# Patient Record
Sex: Female | Born: 1955 | Race: White | Hispanic: No | Marital: Married | Smoking: Former smoker
Health system: Southern US, Community
[De-identification: ages and names within clinical notes are randomized; demographics above are authoritative.]

## PROBLEM LIST (undated history)

## (undated) DIAGNOSIS — I2510 Atherosclerotic heart disease of native coronary artery without angina pectoris: Secondary | ICD-10-CM

## (undated) DIAGNOSIS — K219 Gastro-esophageal reflux disease without esophagitis: Secondary | ICD-10-CM

## (undated) DIAGNOSIS — E119 Type 2 diabetes mellitus without complications: Secondary | ICD-10-CM

## (undated) DIAGNOSIS — M4810 Ankylosing hyperostosis [Forestier], site unspecified: Secondary | ICD-10-CM

## (undated) DIAGNOSIS — I509 Heart failure, unspecified: Secondary | ICD-10-CM

## (undated) DIAGNOSIS — I10 Essential (primary) hypertension: Secondary | ICD-10-CM

## (undated) DIAGNOSIS — N189 Chronic kidney disease, unspecified: Secondary | ICD-10-CM

## (undated) DIAGNOSIS — D649 Anemia, unspecified: Secondary | ICD-10-CM

## (undated) DIAGNOSIS — E875 Hyperkalemia: Secondary | ICD-10-CM

## (undated) DIAGNOSIS — L409 Psoriasis, unspecified: Secondary | ICD-10-CM

## (undated) HISTORY — PX: CORONARY STENT PLACEMENT: SHX1402

## (undated) HISTORY — PX: CORONARY ARTERY BYPASS GRAFT: SHX141

## (undated) HISTORY — DX: Ankylosing hyperostosis (forestier), site unspecified: M48.10

## (undated) HISTORY — DX: Gastro-esophageal reflux disease without esophagitis: K21.9

## (undated) HISTORY — PX: TUBAL LIGATION: SHX77

## (undated) HISTORY — DX: Hyperkalemia: E87.5

## (undated) HISTORY — DX: Atherosclerotic heart disease of native coronary artery without angina pectoris: I25.10

## (undated) HISTORY — DX: Anemia, unspecified: D64.9

## (undated) HISTORY — DX: Chronic kidney disease, unspecified: N18.9

## (undated) HISTORY — DX: Heart failure, unspecified: I50.9

## (undated) HISTORY — PX: HERNIA REPAIR: SHX51

---

## 2010-05-23 DIAGNOSIS — I2510 Atherosclerotic heart disease of native coronary artery without angina pectoris: Secondary | ICD-10-CM | POA: Insufficient documentation

## 2011-08-16 ENCOUNTER — Encounter: Payer: Self-pay | Admitting: Rheumatology

## 2011-08-22 ENCOUNTER — Encounter: Payer: Self-pay | Admitting: Rheumatology

## 2011-09-22 ENCOUNTER — Encounter: Payer: Self-pay | Admitting: Rheumatology

## 2015-05-02 ENCOUNTER — Emergency Department
Admission: EM | Admit: 2015-05-02 | Discharge: 2015-05-02 | Disposition: A | Discharge status: Home / Self Care | Payer: BLUE CROSS/BLUE SHIELD | Attending: Emergency Medicine | Admitting: Emergency Medicine

## 2015-05-02 ENCOUNTER — Encounter: Payer: Self-pay | Admitting: Emergency Medicine

## 2015-05-02 DIAGNOSIS — E119 Type 2 diabetes mellitus without complications: Secondary | ICD-10-CM | POA: Diagnosis not present

## 2015-05-02 DIAGNOSIS — Y9389 Activity, other specified: Secondary | ICD-10-CM | POA: Diagnosis not present

## 2015-05-02 DIAGNOSIS — Z87891 Personal history of nicotine dependence: Secondary | ICD-10-CM | POA: Insufficient documentation

## 2015-05-02 DIAGNOSIS — S6991XA Unspecified injury of right wrist, hand and finger(s), initial encounter: Secondary | ICD-10-CM | POA: Diagnosis present

## 2015-05-02 DIAGNOSIS — Y9289 Other specified places as the place of occurrence of the external cause: Secondary | ICD-10-CM | POA: Diagnosis not present

## 2015-05-02 DIAGNOSIS — Y990 Civilian activity done for income or pay: Secondary | ICD-10-CM | POA: Insufficient documentation

## 2015-05-02 DIAGNOSIS — L03011 Cellulitis of right finger: Secondary | ICD-10-CM | POA: Insufficient documentation

## 2015-05-02 DIAGNOSIS — I10 Essential (primary) hypertension: Secondary | ICD-10-CM | POA: Insufficient documentation

## 2015-05-02 DIAGNOSIS — W228XXA Striking against or struck by other objects, initial encounter: Secondary | ICD-10-CM | POA: Diagnosis not present

## 2015-05-02 HISTORY — DX: Essential (primary) hypertension: I10

## 2015-05-02 HISTORY — DX: Type 2 diabetes mellitus without complications: E11.9

## 2015-05-02 HISTORY — DX: Psoriasis, unspecified: L40.9

## 2015-05-02 MED ORDER — TRAMADOL HCL 50 MG PO TABS: 50.0000 mg | ORAL_TABLET | Freq: Four times a day (QID) | ORAL | Status: DC | PRN

## 2015-05-02 MED ORDER — SULFAMETHOXAZOLE-TRIMETHOPRIM 800-160 MG PO TABS
1.0000 | ORAL_TABLET | Freq: Once | ORAL | Status: AC
  Administered 2015-05-02: 1 via ORAL
  Filled 2015-05-02: qty 1

## 2015-05-02 MED ORDER — NAPROXEN 500 MG PO TABS: 500.0000 mg | ORAL_TABLET | Freq: Two times a day (BID) | ORAL | Status: DC

## 2015-05-02 MED ORDER — NAPROXEN 500 MG PO TABS
500.0000 mg | ORAL_TABLET | Freq: Once | ORAL | Status: AC
  Administered 2015-05-02: 500 mg via ORAL
  Filled 2015-05-02: qty 1

## 2015-05-02 MED ORDER — SULFAMETHOXAZOLE-TRIMETHOPRIM 800-160 MG PO TABS: 1.0000 | ORAL_TABLET | Freq: Two times a day (BID) | ORAL | Status: DC

## 2015-05-02 NOTE — ED Notes (Signed)
Staff cleaned Rt. Middle finger with sterile water and sterile gauze. Finger was dried with a sterile gauze and a band aid was applied to wound. Pt verbalized finger felt comfortable and that she was happy with dressing change.

## 2015-05-02 NOTE — ED Provider Notes (Signed)
[facility] Emergency Department Provider Note  ____________________________________________  Time seen: Approximately 6:39 PM  I have reviewed the triage vital signs and the nursing notes.   HISTORY  Chief Complaint Hand Pain    HPI Katrina Ortiz is a 60 y.o. female patient complaining of redness swelling and tenderness to the third digit right hand. Patient believes she punctured her cuticle at work days ago.. Patient states she was able to express some purulent material from the area 2 days ago. Patient rates the pain as a 5/10. Patient has been soaking the finger in Epsom salt solution for 2 days. No other palliative measures for this complaint. Past Medical History  Diagnosis Date  . Hypertension   . Diabetes mellitus without complication ([place])   . Psoriasis     There are no active problems to display for this patient.   Past Surgical History  Procedure Laterality Date  . Tubal ligation    . Hernia repair    . Cesarean section      2 c-sections  . Coronary artery bypass graft    . Coronary stent placement      Current Outpatient Rx  Name  Route  Sig  Dispense  Refill  . naproxen (NAPROSYN) 500 MG tablet   Oral   Take 1 tablet (500 mg total) by mouth 2 (two) times daily with a meal.   20 tablet   0   . sulfamethoxazole-trimethoprim (BACTRIM DS,SEPTRA DS) 800-160 MG tablet   Oral   Take 1 tablet by mouth 2 (two) times daily.   20 tablet   0   . traMADol (ULTRAM) 50 MG tablet   Oral   Take 1 tablet (50 mg total) by mouth every 6 (six) hours as needed for moderate pain.   12 tablet   0     Allergies Review of patient's allergies indicates no known allergies.  History reviewed. No pertinent family history.  Social History Social History  Substance Use Topics  . Smoking status: Former Smoker    Quit date: 11/22/2002  . Smokeless tobacco: None  . Alcohol Use: No    Review of Systems Constitutional: No fever/chills Eyes:  No visual changes. ENT: No sore throat. Cardiovascular: Denies chest pain. Respiratory: Denies shortness of breath. Gastrointestinal: No abdominal pain.  No nausea, no vomiting.  No diarrhea.  No constipation. Genitourinary: Negative for dysuria. Musculoskeletal: Negative for back pain. Skin: Negative for rash. There is a swelling to the third digit right hand. Neurological: Negative for headaches, focal weakness or numbness. Endocrine:Hypertension and diabetes.  ____________________________________________   PHYSICAL EXAM:  VITAL SIGNS: ED Triage Vitals  Enc Vitals Group     BP 05/02/15 1714 197/65 mmHg     Pulse Rate 05/02/15 1714 90     Resp 05/02/15 1714 20     Temp 05/02/15 1714 98.2 F (36.8 C)     Temp src --      SpO2 05/02/15 1714 97 %     Weight 05/02/15 1714 240 lb (108.863 kg)     Height 05/02/15 1714 5\' 4"  (1.626 m)     Head Cir --      Peak Flow --      Pain Score 05/02/15 1724 5     Pain Loc --      Pain Edu? --      Excl. in [place]? --     Constitutional: Alert and oriented. Well appearing and in no acute distress. Eyes: Conjunctivae are normal.  PERRL. EOMI. Head: Atraumatic. Nose: No congestion/rhinnorhea. Mouth/Throat: Mucous membranes are moist.  Oropharynx non-erythematous. Neck: No stridor.  No cervical spine tenderness to palpation. Hematological/Lymphatic/Immunilogical: No cervical lymphadenopathy. Cardiovascular: Normal rate, regular rhythm. Grossly normal heart sounds.  Good peripheral circulation. Elevated blood pressure. Patient states she did not take her blood pressure medicine today. Respiratory: Normal respiratory effort.  No retractions. Lungs CTAB. Gastrointestinal: Soft and nontender. No distention. No abdominal bruits. No CVA tenderness. Musculoskeletal: No lower extremity tenderness nor edema.  No joint effusions. Neurologic:  Normal speech and language. No gross focal neurologic deficits are appreciated. No gait instability. Skin:   Skin is warm, dry and intact. No rash noted. Edema and erythema to the medial nailbed of the third digit right hand. Psychiatric: Mood and affect are normal. Speech and behavior are normal.  ____________________________________________   LABS (all labs ordered are listed, but only abnormal results are displayed)  Labs Reviewed - No data to display ____________________________________________  EKG   ____________________________________________  RADIOLOGY   ____________________________________________   PROCEDURES  Procedure(s) performed: None  Critical Care performed: No  ____________________________________________   INITIAL IMPRESSION / ASSESSMENT AND PLAN / ED COURSE  Pertinent labs & imaging results that were available during my care of the patient were reviewed by me and considered in my medical decision making (see chart for details).  Paronychia third digit right hand. Patient given discharge care instructions. Patient given a prescription for Bactrim, naproxen, and tramadol. Advised return by ER for condition worsens. ____________________________________________   FINAL CLINICAL IMPRESSION(S) / ED DIAGNOSES  Final diagnoses:  Paronychia of finger of right hand      Sable Feil, PA-C 05/02/15 [place], PA-C 05/02/15 1904  Earleen Newport, MD 05/02/15 [phone]

## 2015-05-02 NOTE — ED Notes (Signed)
Pt presents with redness, swelling, tenderness and pain to R middle finger at this time. Pt states pain started approx 4 days ago when she injured her finger at work. Pt states with some pressure pus was able to come out. Redness and swelling noted to the first distal knuckle but does not extend past that.

## 2015-05-02 NOTE — Discharge Instructions (Signed)
Continued Epsom salt soaks and take medication as directed.

## 2019-02-27 ENCOUNTER — Other Ambulatory Visit: Payer: Self-pay

## 2019-02-27 ENCOUNTER — Emergency Department: Payer: BLUE CROSS/BLUE SHIELD

## 2019-02-27 ENCOUNTER — Emergency Department
Admission: EM | Admit: 2019-02-27 | Discharge: 2019-02-27 | Disposition: A | Discharge status: Home / Self Care | Payer: BLUE CROSS/BLUE SHIELD | Attending: Emergency Medicine | Admitting: Emergency Medicine

## 2019-02-27 DIAGNOSIS — I2510 Atherosclerotic heart disease of native coronary artery without angina pectoris: Secondary | ICD-10-CM | POA: Diagnosis not present

## 2019-02-27 DIAGNOSIS — I10 Essential (primary) hypertension: Secondary | ICD-10-CM | POA: Diagnosis not present

## 2019-02-27 DIAGNOSIS — R1084 Generalized abdominal pain: Secondary | ICD-10-CM | POA: Diagnosis not present

## 2019-02-27 DIAGNOSIS — Z951 Presence of aortocoronary bypass graft: Secondary | ICD-10-CM | POA: Insufficient documentation

## 2019-02-27 DIAGNOSIS — Z7901 Long term (current) use of anticoagulants: Secondary | ICD-10-CM | POA: Diagnosis not present

## 2019-02-27 DIAGNOSIS — E119 Type 2 diabetes mellitus without complications: Secondary | ICD-10-CM | POA: Diagnosis not present

## 2019-02-27 DIAGNOSIS — Z87891 Personal history of nicotine dependence: Secondary | ICD-10-CM | POA: Insufficient documentation

## 2019-02-27 DIAGNOSIS — Z79899 Other long term (current) drug therapy: Secondary | ICD-10-CM | POA: Insufficient documentation

## 2019-02-27 DIAGNOSIS — R197 Diarrhea, unspecified: Secondary | ICD-10-CM | POA: Diagnosis not present

## 2019-02-27 LAB — URINALYSIS, COMPLETE (UACMP) WITH MICROSCOPIC
Bilirubin Urine: NEGATIVE
Glucose, UA: 150 mg/dL — AB
Hgb urine dipstick: NEGATIVE
Ketones, ur: 5 mg/dL — AB
Nitrite: NEGATIVE
Protein, ur: 100 mg/dL — AB
Specific Gravity, Urine: 1.027 (ref 1.005–1.030)
WBC, UA: >50 WBC/hpf — ABNORMAL HIGH (ref 0–5)
pH: 5 (ref 5.0–8.0)

## 2019-02-27 LAB — COMPREHENSIVE METABOLIC PANEL
ALT: 10 U/L (ref 0–44)
AST: 10 U/L — ABNORMAL LOW (ref 15–41)
Albumin: 2.5 g/dL — ABNORMAL LOW (ref 3.5–5.0)
Alkaline Phosphatase: 95 U/L (ref 38–126)
Anion gap: 9 (ref 5–15)
BUN: 10 mg/dL (ref 8–23)
CO2: 25 mmol/L (ref 22–32)
Calcium: 8.2 mg/dL — ABNORMAL LOW (ref 8.9–10.3)
Chloride: 104 mmol/L (ref 98–111)
Creatinine, Ser: 0.85 mg/dL (ref 0.44–1.00)
GFR calc Af Amer: >60 mL/min (ref >60)
GFR calc non Af Amer: >60 mL/min (ref >60)
Glucose, Bld: 166 mg/dL — ABNORMAL HIGH (ref 70–99)
Potassium: 3.5 mmol/L (ref 3.5–5.1)
Sodium: 138 mmol/L (ref 135–145)
Total Bilirubin: 0.8 mg/dL (ref 0.3–1.2)
Total Protein: 6.2 g/dL — ABNORMAL LOW (ref 6.5–8.1)

## 2019-02-27 LAB — CBC
HCT: 37.4 % (ref 36.0–46.0)
Hemoglobin: 11.5 g/dL — ABNORMAL LOW (ref 12.0–15.0)
MCH: 22.8 pg — ABNORMAL LOW (ref 26.0–34.0)
MCHC: 30.7 g/dL (ref 30.0–36.0)
MCV: 74.1 fL — ABNORMAL LOW (ref 80.0–100.0)
Platelets: 308 10*3/uL (ref 150–400)
RBC: 5.05 MIL/uL (ref 3.87–5.11)
RDW: 16.7 % — ABNORMAL HIGH (ref 11.5–15.5)
WBC: 9 10*3/uL (ref 4.0–10.5)
nRBC: 0 % (ref 0.0–0.2)

## 2019-02-27 LAB — GLUCOSE, CAPILLARY: Glucose-Capillary: 136 mg/dL — ABNORMAL HIGH (ref 70–99)

## 2019-02-27 LAB — LACTIC ACID, PLASMA: Lactic Acid, Venous: 0.9 mmol/L (ref 0.5–1.9)

## 2019-02-27 LAB — TROPONIN I (HIGH SENSITIVITY): Troponin I (High Sensitivity): 12 ng/L (ref <18)

## 2019-02-27 LAB — LIPASE, BLOOD: Lipase: 18 U/L (ref 11–51)

## 2019-02-27 MED ORDER — LOPERAMIDE HCL 2 MG PO CAPS
4.0000 mg | ORAL_CAPSULE | Freq: Once | ORAL | Status: AC
  Administered 2019-02-27: 4 mg via ORAL
  Filled 2019-02-27: qty 2

## 2019-02-27 MED ORDER — ONDANSETRON 4 MG PO TBDP
4.0000 mg | ORAL_TABLET | Freq: Once | ORAL | Status: AC | PRN
  Administered 2019-02-27: 4 mg via ORAL
  Filled 2019-02-27: qty 1

## 2019-02-27 MED ORDER — ONDANSETRON HCL 4 MG/2ML IJ SOLN
4.0000 mg | Freq: Once | INTRAMUSCULAR | Status: AC
  Administered 2019-02-27: 4 mg via INTRAVENOUS
  Filled 2019-02-27: qty 2

## 2019-02-27 MED ORDER — SODIUM CHLORIDE 0.9 % IV BOLUS
1000.0000 mL | Freq: Once | INTRAVENOUS | Status: AC
  Administered 2019-02-27: 1000 mL via INTRAVENOUS

## 2019-02-27 MED ORDER — ONDANSETRON 4 MG PO TBDP: 4.0000 mg | ORAL_TABLET | Freq: Three times a day (TID) | ORAL | 0 refills | Status: DC | PRN

## 2019-02-27 MED ORDER — KETOROLAC TROMETHAMINE 30 MG/ML IJ SOLN
15.0000 mg | INTRAMUSCULAR | Status: AC
  Administered 2019-02-27: 09:00:00 15 mg via INTRAVENOUS
  Filled 2019-02-27: qty 1

## 2019-02-27 MED ORDER — OXYCODONE-ACETAMINOPHEN 5-325 MG PO TABS: 1.0000 | ORAL_TABLET | Freq: Four times a day (QID) | ORAL | 0 refills | Status: DC | PRN

## 2019-02-27 MED ORDER — OXYCODONE-ACETAMINOPHEN 5-325 MG PO TABS
1.0000 | ORAL_TABLET | Freq: Once | ORAL | Status: AC
  Administered 2019-02-27: 1 via ORAL
  Filled 2019-02-27: qty 1

## 2019-02-27 MED ORDER — IOHEXOL 300 MG/ML  SOLN
100.0000 mL | Freq: Once | INTRAMUSCULAR | Status: AC | PRN
  Administered 2019-02-27: 100 mL via INTRAVENOUS

## 2019-02-27 MED ORDER — MORPHINE SULFATE (PF) 4 MG/ML IV SOLN
4.0000 mg | Freq: Once | INTRAVENOUS | Status: AC
  Administered 2019-02-27: 4 mg via INTRAVENOUS
  Filled 2019-02-27: qty 1

## 2019-02-27 MED ORDER — DICYCLOMINE HCL 20 MG PO TABS: 20.0000 mg | ORAL_TABLET | Freq: Three times a day (TID) | ORAL | 0 refills | Status: DC | PRN

## 2019-02-27 NOTE — ED Triage Notes (Signed)
Generalized abdominal pain, diarrhea and weakness X 5 days. Pt states that she was recently seen at a hospital and told to follow up if no better. Pt alert and oriented X4, cooperative, RR even and unlabored, color WNL. Pt in NAD.

## 2019-02-27 NOTE — Discharge Instructions (Signed)
Your CT scan shows inflammation of the small intestine (enteritis) as the cause of your diarrhea.  This usually resolves on its own after a few days.  The scan also notes thickening of the uterus, which should be followed up with an ultrasound and visit to gynecology to further evaluate.    IMPRESSION:  1. Wall thickening of several proximal small bowel loops compatible  with enteritis.  2. Small volume ascites.  3. Small pleural effusions.  4. Endometrial thickening and/or fluid, abnormal in a postmenopausal  patient. Possible small uterine fibroids. Nonemergent pelvic  ultrasound is recommended if not previously performed elsewhere.  5. Aortic Atherosclerosis (ICD10-I70.0).

## 2019-02-27 NOTE — ED Notes (Signed)
This RN attempted to call husband 4X w/o sucess . UA to leave a voicemail. Charge RN Marya Amsler made aware.

## 2019-02-27 NOTE — ED Notes (Signed)
Patient assisted to use restroom. Ambulatory with one assist. Given ice chips per MD.

## 2019-02-27 NOTE — ED Notes (Signed)
This RN attempted to call pt's husband 2x; no answer and unable to leave a VM.   Pt provided with phone to call sister as a ride home.

## 2019-02-27 NOTE — ED Notes (Signed)
Pt c/o days of diarrhea ("yellowish" in color, 5 in the last 24 hours, ongoing for 5 days started with N/V, and left lower sharp 10/10 abdominal pain radiating to back and worse with palpation, hernia and C-section hx   pt denies hx of CDiff, cough, ABX use, travel or COVID exposure

## 2019-02-27 NOTE — ED Provider Notes (Signed)
Procedures  Clinical Course as of Feb 26 901  Wed Feb 27, 2019  0723 UA collected on bedside commode and dirty. Patient denies any symptoms of UTI. Will send for culture and not treat at this time. CT and C. Diff pending.    [CV]  [id number] CT and C. difficile are pending.  Care transferred to Dr. Joni Fears.   [CV]    Clinical Course User Index [CV] Alfred Levins [place], MD    ----------------------------------------- 9:03 AM on 02/27/2019 ----------------------------------------- Still no bowel movements in the ED. No fever, no leukocytosis. Can cancel the C. difficile test. CT scan shows enteritis without colon inflammation. Likely a self-limited illness that I will manage symptomatically with loperamide and Zofran. Patient was counseled on the endometrial thickening and need for gynecology follow-up to screen for cancer.  Final diagnoses:  Generalized abdominal pain  Diarrhea of presumed infectious origin       Carrie Mew, MD 02/27/19 [phone]

## 2019-02-27 NOTE — ED Notes (Signed)
This RN provide pt with a phone to call husband. Pt does not have a ride home except for husband. Pt st unable to leave a voicemail "oue Vm is messed up".

## 2019-02-27 NOTE — ED Notes (Signed)
Unable to collect stool sample. Pt st cannot have a BM at this time.

## 2019-02-27 NOTE — ED Provider Notes (Signed)
[facility] Emergency Department Provider Note  ____________________________________________  Time seen: Approximately 6:28 AM  I have reviewed the triage vital signs and the nursing notes.   HISTORY  Chief Complaint Abdominal Pain and Weakness   HPI Katrina Ortiz is a 64 y.o. 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  Patient reports that she has been sick for 5 days.  Initially had vomiting however has not vomited for the last 4 days.  She has been having roughly 5 daily episodes of watery diarrhea.  No melena, no hematemesis, no hematochezia, no coffee-ground emesis.  She is also complaining of diffuse cramping abdominal pain worse on the left side radiating to her back, 10 out of 10.  She has had chills but no fever.  No chest pain or shortness of breath.  Patient was seen at [facility]  3 days ago for the same with a negative Covid swab and normal labs.  No imaging was done at that time.  She denies any prior history of C. difficile, recent antibiotic use.  Past Medical History:  Diagnosis Date  . Diabetes mellitus without complication ([place])   . Hypertension   . Psoriasis     There are no problems to display for this patient.   Past Surgical History:  Procedure Laterality Date  . CESAREAN SECTION     2 c-sections  . CORONARY ARTERY BYPASS GRAFT    . CORONARY STENT PLACEMENT    . HERNIA REPAIR    . TUBAL LIGATION      Prior to Admission medications   Medication Sig Start Date End Date Taking? Authorizing Provider  naproxen (NAPROSYN) 500 MG tablet Take 1 tablet (500 mg total) by mouth 2 (two) times daily with a meal. 05/02/15   Sable Feil, PA-C  sulfamethoxazole-trimethoprim (BACTRIM DS,SEPTRA DS) 800-160 MG tablet Take 1 tablet by mouth 2 (two) times daily. 05/02/15   Sable Feil, PA-C  traMADol (ULTRAM) 50 MG tablet Take 1 tablet (50 mg  total) by mouth every 6 (six) hours as needed for moderate pain. 05/02/15   Sable Feil, PA-C    Allergies Patient has no known allergies.  No family history on file.  Social History Social History   Tobacco Use  . Smoking status: Former Smoker    Quit date: 11/22/2002    Years since quitting: 16.2  Substance Use Topics  . Alcohol use: No  . Drug use: No    Review of Systems  Constitutional: Negative for fever. Eyes: Negative for visual changes. ENT: Negative for sore throat. Neck: No neck pain  Cardiovascular: Negative for chest pain. Respiratory: Negative for shortness of breath. Gastrointestinal: + abdominal pain, vomiting and diarrhea. Genitourinary: Negative for dysuria. Musculoskeletal: Negative for back pain. Skin: Negative for rash. Neurological: Negative for headaches, weakness or numbness. Psych: No SI or HI  ____________________________________________   PHYSICAL EXAM:  VITAL SIGNS: ED Triage Vitals  Enc Vitals Group     BP 02/27/19 0156 (!) 164/77     Pulse Rate 02/27/19 0156 90     Resp 02/27/19 0156 18     Temp 02/27/19 0156 99.2 F (37.3 C)     Temp Source 02/27/19 0156 Oral     SpO2 02/27/19 0156 100 %     Weight 02/27/19 0157 200 lb (90.7 kg)     Height 02/27/19 0157 5\' 4"  (1.626 m)  Head Circumference --      Peak Flow --      Pain Score 02/27/19 0157 10     Pain Loc --      Pain Edu? --      Excl. in [place]? --     Constitutional: Alert and oriented, looks uncomfortable due to pain.  HEENT:      Head: Normocephalic and atraumatic.         Eyes: Conjunctivae are normal. Sclera is non-icteric.       Mouth/Throat: Mucous membranes are moist.       Neck: Supple with no signs of meningismus. Cardiovascular: Regular rate and rhythm. No murmurs, gallops, or rubs. 2+ symmetrical distal pulses are present in all extremities. No JVD. Respiratory: Normal respiratory effort. Lungs are clear to auscultation bilaterally. No wheezes, crackles,  or rhonchi.  Gastrointestinal: Soft, diffusely tender to palpation worse in the left quadrants, and non distended with positive bowel sounds. No rebound or guarding. Genitourinary: No CVA tenderness. Musculoskeletal: Nontender with normal range of motion in all extremities. No edema, cyanosis, or erythema of extremities. Neurologic: Normal speech and language. Face is symmetric. Moving all extremities. No gross focal neurologic deficits are appreciated. Skin: Skin is warm, dry and intact. No rash noted. Psychiatric: Mood and affect are normal. Speech and behavior are normal.  ____________________________________________   LABS (all labs ordered are listed, but only abnormal results are displayed)  Labs Reviewed  CBC - Abnormal; Notable for the following components:      Result Value   Hemoglobin 11.5 (*)    MCV 74.1 (*)    MCH 22.8 (*)    RDW 16.7 (*)    All other components within normal limits  COMPREHENSIVE METABOLIC PANEL - Abnormal; Notable for the following components:   Glucose, Bld 166 (*)    Calcium 8.2 (*)    Total Protein 6.2 (*)    Albumin 2.5 (*)    AST 10 (*)    All other components within normal limits  URINALYSIS, COMPLETE (UACMP) WITH MICROSCOPIC - Abnormal; Notable for the following components:   Color, Urine AMBER (*)    APPearance CLOUDY (*)    Glucose, UA 150 (*)    Ketones, ur 5 (*)    Protein, ur 100 (*)    Leukocytes,Ua MODERATE (*)    WBC, UA >50 (*)    Bacteria, UA MANY (*)    All other components within normal limits  C DIFFICILE QUICK SCREEN W PCR REFLEX  URINE CULTURE  LIPASE, BLOOD  LACTIC ACID, PLASMA  TROPONIN I (HIGH SENSITIVITY)   ____________________________________________  EKG  ED ECG REPORT I, Rudene Re, the attending physician, personally viewed and interpreted this ECG.  Normal sinus rhythm, rate of 91, normal intervals, normal axis, no ST elevations or depressions, inferior and anterior Q waves.  No prior for  comparison. ____________________________________________  RADIOLOGY  CT a/p: PND ____________________________________________   PROCEDURES  Procedure(s) performed: None Procedures Critical Care performed:  None ____________________________________________   INITIAL IMPRESSION / ASSESSMENT AND PLAN / ED COURSE  64 y.o. 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  Patient looks uncomfortable due to pain, vital signs showing low-grade temp of 99.64F but otherwise within normal limits.  Abdomen is soft with diffuse tenderness worse in the left quadrants with no rebound or guarding.  Differential diagnoses including C. difficile colitis versus colitis versus enterocolitis versus diverticulitis.  We  will check labs for any signs of dehydration, sepsis, electrolyte derangements.  Will give IV fluids, Zofran and morphine for symptom relief.  Will check for C. difficile.  Since patient has had ongoing symptoms for 5 days with 2 visits to the emergency room we will proceed with a CT of the abdomen.  Clinical Course as of Feb 26 730  Wed Feb 27, 2019  [id number] UA collected on bedside commode and dirty. Patient denies any symptoms of UTI. Will send for culture and not treat at this time. CT and C. Diff pending.    [CV]  [id number] CT and C. difficile are pending.  Care transferred to Dr. Joni Fears.   [CV]    Clinical Course User Index [CV] Alfred Levins [place], MD      As part of my medical decision making, I reviewed the following data within the [place] notes reviewed and incorporated, Labs reviewed , EKG interpreted , Old EKG reviewed, Old chart reviewed, Patient signed out to Dr. Joni Fears, Radiograph reviewed , Notes from prior ED visits and [place] Controlled Substance Database   Please note:  Patient was evaluated in Emergency Department today for the symptoms described in  the history of present illness. Patient was evaluated in the context of the global COVID-19 pandemic, which necessitated consideration that the patient might be at risk for infection with the SARS-CoV-2 virus that causes COVID-19. Institutional protocols and algorithms that pertain to the evaluation of patients at risk for COVID-19 are in a state of rapid change based on information released by regulatory bodies including the CDC and federal and state organizations. These policies and algorithms were followed during the patient's care in the ED.  Some ED evaluations and interventions may be delayed as a result of limited staffing during the pandemic.   ____________________________________________   FINAL CLINICAL IMPRESSION(S) / ED DIAGNOSES   Final diagnoses:  Generalized abdominal pain  Diarrhea of presumed infectious origin      NEW MEDICATIONS STARTED DURING THIS VISIT:  ED Discharge Orders    None       Note:  This document was prepared using Dragon voice recognition software and may include unintentional dictation errors.    Alfred Levins, [place], MD 02/28/19 [id number]

## 2019-02-27 NOTE — ED Provider Notes (Signed)
Procedures  Clinical Course as of Feb 27 915  Wed Feb 27, 2019  0723 UA collected on bedside commode and dirty. Patient denies any symptoms of UTI. Will send for culture and not treat at this time. CT and C. Diff pending.    [CV]  [id number] CT and C. difficile are pending.  Care transferred to Dr. Joni Fears.   [CV]    Clinical Course User Index [CV] Alfred Levins [place], MD    ----------------------------------------- 9:17 AM on 02/27/2019 -----------------------------------------  Informed patient of the CT scan results including the need for gynecology follow-up to evaluate for possible endometrial cancer.  She is feeling better but still having a lot of cramping pain and muscular flank pain.  Give Toradol, Percocet, Imodium.  Sent a limited prescription of Percocet to her [facility].  Final diagnoses:  Generalized abdominal pain  Diarrhea of presumed infectious origin      Carrie Mew, MD 02/27/19 [phone]

## 2019-03-01 LAB — URINE CULTURE: Culture: >=100000 — AB

## 2019-04-10 ENCOUNTER — Inpatient Hospital Stay
Admission: EM | Admit: 2019-04-10 | Discharge: 2019-04-14 | DRG: 208 | Disposition: A | Discharge status: Home Health | Payer: BLUE CROSS/BLUE SHIELD | Attending: Internal Medicine | Admitting: Internal Medicine

## 2019-04-10 ENCOUNTER — Other Ambulatory Visit: Payer: Self-pay

## 2019-04-10 ENCOUNTER — Emergency Department: Payer: BLUE CROSS/BLUE SHIELD

## 2019-04-10 DIAGNOSIS — I110 Hypertensive heart disease with heart failure: Secondary | ICD-10-CM | POA: Diagnosis present

## 2019-04-10 DIAGNOSIS — I83009 Varicose veins of unspecified lower extremity with ulcer of unspecified site: Secondary | ICD-10-CM | POA: Diagnosis present

## 2019-04-10 DIAGNOSIS — J9600 Acute respiratory failure, unspecified whether with hypoxia or hypercapnia: Secondary | ICD-10-CM | POA: Diagnosis present

## 2019-04-10 DIAGNOSIS — I2510 Atherosclerotic heart disease of native coronary artery without angina pectoris: Secondary | ICD-10-CM | POA: Diagnosis present

## 2019-04-10 DIAGNOSIS — I878 Other specified disorders of veins: Secondary | ICD-10-CM | POA: Diagnosis present

## 2019-04-10 DIAGNOSIS — I5031 Acute diastolic (congestive) heart failure: Secondary | ICD-10-CM | POA: Diagnosis present

## 2019-04-10 DIAGNOSIS — R627 Adult failure to thrive: Secondary | ICD-10-CM | POA: Diagnosis present

## 2019-04-10 DIAGNOSIS — Z7902 Long term (current) use of antithrombotics/antiplatelets: Secondary | ICD-10-CM

## 2019-04-10 DIAGNOSIS — Z87891 Personal history of nicotine dependence: Secondary | ICD-10-CM | POA: Diagnosis not present

## 2019-04-10 DIAGNOSIS — Z79891 Long term (current) use of opiate analgesic: Secondary | ICD-10-CM

## 2019-04-10 DIAGNOSIS — J9622 Acute and chronic respiratory failure with hypercapnia: Secondary | ICD-10-CM | POA: Diagnosis not present

## 2019-04-10 DIAGNOSIS — Z79899 Other long term (current) drug therapy: Secondary | ICD-10-CM

## 2019-04-10 DIAGNOSIS — J9602 Acute respiratory failure with hypercapnia: Secondary | ICD-10-CM | POA: Diagnosis present

## 2019-04-10 DIAGNOSIS — E872 Acidosis: Secondary | ICD-10-CM | POA: Diagnosis present

## 2019-04-10 DIAGNOSIS — E1122 Type 2 diabetes mellitus with diabetic chronic kidney disease: Secondary | ICD-10-CM

## 2019-04-10 DIAGNOSIS — Z20822 Contact with and (suspected) exposure to covid-19: Secondary | ICD-10-CM | POA: Diagnosis present

## 2019-04-10 DIAGNOSIS — E162 Hypoglycemia, unspecified: Secondary | ICD-10-CM | POA: Diagnosis not present

## 2019-04-10 DIAGNOSIS — L409 Psoriasis, unspecified: Secondary | ICD-10-CM | POA: Diagnosis present

## 2019-04-10 DIAGNOSIS — N179 Acute kidney failure, unspecified: Secondary | ICD-10-CM | POA: Diagnosis present

## 2019-04-10 DIAGNOSIS — J9601 Acute respiratory failure with hypoxia: Secondary | ICD-10-CM | POA: Diagnosis present

## 2019-04-10 DIAGNOSIS — Z6834 Body mass index (BMI) 34.0-34.9, adult: Secondary | ICD-10-CM | POA: Diagnosis not present

## 2019-04-10 DIAGNOSIS — Z951 Presence of aortocoronary bypass graft: Secondary | ICD-10-CM | POA: Diagnosis not present

## 2019-04-10 DIAGNOSIS — Z955 Presence of coronary angioplasty implant and graft: Secondary | ICD-10-CM | POA: Diagnosis not present

## 2019-04-10 DIAGNOSIS — E669 Obesity, unspecified: Secondary | ICD-10-CM | POA: Diagnosis present

## 2019-04-10 DIAGNOSIS — J441 Chronic obstructive pulmonary disease with (acute) exacerbation: Secondary | ICD-10-CM

## 2019-04-10 DIAGNOSIS — J189 Pneumonia, unspecified organism: Secondary | ICD-10-CM | POA: Diagnosis present

## 2019-04-10 DIAGNOSIS — J9621 Acute and chronic respiratory failure with hypoxia: Secondary | ICD-10-CM | POA: Diagnosis not present

## 2019-04-10 DIAGNOSIS — G9341 Metabolic encephalopathy: Secondary | ICD-10-CM | POA: Diagnosis present

## 2019-04-10 DIAGNOSIS — E119 Type 2 diabetes mellitus without complications: Secondary | ICD-10-CM

## 2019-04-10 DIAGNOSIS — Z791 Long term (current) use of non-steroidal anti-inflammatories (NSAID): Secondary | ICD-10-CM

## 2019-04-10 DIAGNOSIS — Z794 Long term (current) use of insulin: Secondary | ICD-10-CM

## 2019-04-10 DIAGNOSIS — G9340 Encephalopathy, unspecified: Secondary | ICD-10-CM | POA: Diagnosis not present

## 2019-04-10 DIAGNOSIS — E1165 Type 2 diabetes mellitus with hyperglycemia: Secondary | ICD-10-CM | POA: Diagnosis present

## 2019-04-10 DIAGNOSIS — R2681 Unsteadiness on feet: Secondary | ICD-10-CM

## 2019-04-10 DIAGNOSIS — E11649 Type 2 diabetes mellitus with hypoglycemia without coma: Secondary | ICD-10-CM | POA: Diagnosis present

## 2019-04-10 LAB — COMPREHENSIVE METABOLIC PANEL
ALT: 9 U/L (ref 0–44)
AST: 10 U/L — ABNORMAL LOW (ref 15–41)
Albumin: 2.7 g/dL — ABNORMAL LOW (ref 3.5–5.0)
Alkaline Phosphatase: 104 U/L (ref 38–126)
Anion gap: 9 (ref 5–15)
BUN: 21 mg/dL (ref 8–23)
CO2: 25 mmol/L (ref 22–32)
Calcium: 8.7 mg/dL — ABNORMAL LOW (ref 8.9–10.3)
Chloride: 100 mmol/L (ref 98–111)
Creatinine, Ser: 1.03 mg/dL — ABNORMAL HIGH (ref 0.44–1.00)
GFR calc Af Amer: >60 mL/min (ref >60)
GFR calc non Af Amer: 57 mL/min — ABNORMAL LOW (ref >60)
Glucose, Bld: 261 mg/dL — ABNORMAL HIGH (ref 70–99)
Potassium: 5 mmol/L (ref 3.5–5.1)
Sodium: 134 mmol/L — ABNORMAL LOW (ref 135–145)
Total Bilirubin: 0.6 mg/dL (ref 0.3–1.2)
Total Protein: 7 g/dL (ref 6.5–8.1)

## 2019-04-10 LAB — BLOOD GAS, ARTERIAL
Acid-Base Excess: 0.1 mmol/L (ref 0.0–2.0)
Bicarbonate: 26 mmol/L (ref 20.0–28.0)
FIO2: 0.65
MECHVT: 500 mL
O2 Saturation: 99.7 %
PEEP: 5 cmH2O
Patient temperature: 37
RATE: 16 resp/min
pCO2 arterial: 46 mmHg (ref 32.0–48.0)
pH, Arterial: 7.36 (ref 7.350–7.450)
pO2, Arterial: 197 mmHg — ABNORMAL HIGH (ref 83.0–108.0)

## 2019-04-10 LAB — CBC WITH DIFFERENTIAL/PLATELET
Abs Immature Granulocytes: 0.06 10*3/uL (ref 0.00–0.07)
Basophils Absolute: 0 10*3/uL (ref 0.0–0.1)
Basophils Relative: 0 %
Eosinophils Absolute: 0 10*3/uL (ref 0.0–0.5)
Eosinophils Relative: 0 %
HCT: 34.3 % — ABNORMAL LOW (ref 36.0–46.0)
Hemoglobin: 10.2 g/dL — ABNORMAL LOW (ref 12.0–15.0)
Immature Granulocytes: 1 %
Lymphocytes Relative: 10 %
Lymphs Abs: 1 10*3/uL (ref 0.7–4.0)
MCH: 22.9 pg — ABNORMAL LOW (ref 26.0–34.0)
MCHC: 29.7 g/dL — ABNORMAL LOW (ref 30.0–36.0)
MCV: 77.1 fL — ABNORMAL LOW (ref 80.0–100.0)
Monocytes Absolute: 0.5 10*3/uL (ref 0.1–1.0)
Monocytes Relative: 5 %
Neutro Abs: 8.1 10*3/uL — ABNORMAL HIGH (ref 1.7–7.7)
Neutrophils Relative %: 84 %
Platelets: 367 10*3/uL (ref 150–400)
RBC: 4.45 MIL/uL (ref 3.87–5.11)
RDW: 16.6 % — ABNORMAL HIGH (ref 11.5–15.5)
WBC: 9.7 10*3/uL (ref 4.0–10.5)
nRBC: 0 % (ref 0.0–0.2)

## 2019-04-10 LAB — BLOOD GAS, VENOUS
Acid-base deficit: 0.2 mmol/L (ref 0.0–2.0)
Bicarbonate: 29.5 mmol/L — ABNORMAL HIGH (ref 20.0–28.0)
O2 Saturation: 14.4 %
Patient temperature: 37
pCO2, Ven: 72 mmHg (ref 44.0–60.0)
pH, Ven: 7.22 — ABNORMAL LOW (ref 7.250–7.430)
pO2, Ven: <31 mmHg — CL (ref 32.0–45.0)

## 2019-04-10 LAB — URINALYSIS, COMPLETE (UACMP) WITH MICROSCOPIC
Bilirubin Urine: NEGATIVE
Glucose, UA: NEGATIVE mg/dL
Hgb urine dipstick: NEGATIVE
Ketones, ur: NEGATIVE mg/dL
Leukocytes,Ua: NEGATIVE
Nitrite: NEGATIVE
Protein, ur: 100 mg/dL — AB
Specific Gravity, Urine: 1.018 (ref 1.005–1.030)
pH: 6 (ref 5.0–8.0)

## 2019-04-10 LAB — LACTIC ACID, PLASMA: Lactic Acid, Venous: 0.6 mmol/L (ref 0.5–1.9)

## 2019-04-10 LAB — GLUCOSE, CAPILLARY
Glucose-Capillary: 255 mg/dL — ABNORMAL HIGH (ref 70–99)
Glucose-Capillary: 46 mg/dL — ABNORMAL LOW (ref 70–99)

## 2019-04-10 MED ORDER — DEXTROSE 50 % IV SOLN
INTRAVENOUS | Status: AC
  Administered 2019-04-10: 50 mL
  Filled 2019-04-10: qty 100

## 2019-04-10 MED ORDER — DEXTROSE 10 % IV SOLN: INTRAVENOUS | Status: DC

## 2019-04-10 MED ORDER — ACETAMINOPHEN 325 MG PO TABS: 650.0000 mg | ORAL_TABLET | ORAL | Status: DC | PRN

## 2019-04-10 MED ORDER — PROPOFOL 1000 MG/100ML IV EMUL
5.0000 ug/kg/min | INTRAVENOUS | Status: DC
  Administered 2019-04-11 (×4): 50 ug/kg/min via INTRAVENOUS
  Filled 2019-04-10 (×5): qty 100

## 2019-04-10 MED ORDER — ENOXAPARIN SODIUM 40 MG/0.4ML ~~LOC~~ SOLN
40.0000 mg | SUBCUTANEOUS | Status: DC
  Administered 2019-04-11 – 2019-04-14 (×4): 40 mg via SUBCUTANEOUS
  Filled 2019-04-10 (×4): qty 0.4

## 2019-04-10 MED ORDER — SUCCINYLCHOLINE CHLORIDE 20 MG/ML IJ SOLN
100.0000 mg | Freq: Once | INTRAMUSCULAR | Status: AC
  Administered 2019-04-10: 23:00:00 100 mg via INTRAVENOUS

## 2019-04-10 MED ORDER — DEXTROSE 5 % IN LACTATED RINGERS IV BOLUS
1000.0000 mL | Freq: Once | INTRAVENOUS | Status: AC
  Administered 2019-04-10: 22:00:00 1000 mL via INTRAVENOUS
  Filled 2019-04-10: qty 1000

## 2019-04-10 MED ORDER — FAMOTIDINE IN NACL 20-0.9 MG/50ML-% IV SOLN
20.0000 mg | Freq: Two times a day (BID) | INTRAVENOUS | Status: DC
  Administered 2019-04-11 – 2019-04-12 (×4): 20 mg via INTRAVENOUS
  Filled 2019-04-10 (×4): qty 50

## 2019-04-10 MED ORDER — FENTANYL CITRATE (PF) 100 MCG/2ML IJ SOLN: 50.0000 ug | INTRAMUSCULAR | Status: DC | PRN

## 2019-04-10 MED ORDER — VECURONIUM BROMIDE 10 MG IV SOLR
10.0000 mg | Freq: Once | INTRAVENOUS | Status: AC
  Administered 2019-04-10: 23:00:00 10 mg via INTRAVENOUS

## 2019-04-10 MED ORDER — PROPOFOL 1000 MG/100ML IV EMUL
INTRAVENOUS | Status: AC
  Administered 2019-04-10: 10 ug/kg/min via INTRAVENOUS
  Filled 2019-04-10: qty 100

## 2019-04-10 MED ORDER — DEXTROSE 50 % IV SOLN
1.0000 | Freq: Once | INTRAVENOUS | Status: AC
  Administered 2019-04-10: 22:00:00 50 mL via INTRAVENOUS

## 2019-04-10 MED ORDER — ONDANSETRON HCL 4 MG/2ML IJ SOLN: 4.0000 mg | Freq: Four times a day (QID) | INTRAMUSCULAR | Status: DC | PRN

## 2019-04-10 MED ORDER — ETOMIDATE 2 MG/ML IV SOLN
20.0000 mg | Freq: Once | INTRAVENOUS | Status: AC
  Administered 2019-04-10: 23:00:00 20 mg via INTRAVENOUS

## 2019-04-10 NOTE — ED Triage Notes (Signed)
Patient arrives to ED via EMS due to onset of AMS today that is reported by husband.   EMS states her original BG at home was 37, patient was administered D10 and BG went to 148, BG fell to 70 in 5 minutes and was 65 on arrival according to EMS. Patient is altered, uncooperative and not following verbal commands. Patient has foul smelling urine and is cold and clammy to the touch.  EMS reports house was in poor living conditions with feces on the walls.

## 2019-04-10 NOTE — ED Provider Notes (Signed)
[facility] Emergency Department Provider Note       Time seen: ----------------------------------------- 9:54 PM on 04/10/2019 -----------------------------------------  Level V caveat: History/ROS limited by altered mental status I have reviewed the triage vital signs and the nursing notes.  HISTORY   Chief Complaint Altered Mental Status    HPI Katrina Ortiz is a 64 y.o. female with a history of diabetes, hypertension, psoriasis who presents to the ED for altered mental status.  Reportedly she has had hypoglycemia, was given D50 prehospital with mild improvement in her blood sugar.  Family states she has been unresponsive for several hours today.  She is awake but disoriented and is not following commands well.  Past Medical History:  Diagnosis Date  . Diabetes mellitus without complication ([place])   . Hypertension   . Psoriasis     There are no problems to display for this patient.   Past Surgical History:  Procedure Laterality Date  . CESAREAN SECTION     2 c-sections  . CORONARY ARTERY BYPASS GRAFT    . CORONARY STENT PLACEMENT    . HERNIA REPAIR    . TUBAL LIGATION      Allergies Patient has no known allergies.  Social History Social History   Tobacco Use  . Smoking status: Former Smoker    Quit date: 11/22/2002    Years since quitting: 16.3  Substance Use Topics  . Alcohol use: No  . Drug use: No    Review of Systems Unknown, altered mental status, reported hypoglycemia  All systems negative/normal/unremarkable except as stated in the HPI  ____________________________________________   PHYSICAL EXAM:  VITAL SIGNS: ED Triage Vitals  Enc Vitals Group     BP --      Pulse Rate 04/10/19 2145 84     Resp 04/10/19 2145 (!) 24     Temp --      Temp src --      SpO2 04/10/19 2145 93 %     Weight 04/10/19 2148 212 lb (96.2 kg)     Height 04/10/19 2148 _0  (1.676 m)     Head Circumference --      Peak Flow --       Pain Score --      Pain Loc --      Pain Edu? --      Excl. in [place]? --     Constitutional: Alert but disoriented, agitated and delirious Eyes: Conjunctivae are normal. Normal extraocular movements. ENT      Head: Normocephalic and atraumatic.      Nose: No congestion/rhinnorhea.      Mouth/Throat: Mucous membranes are moist.      Neck: No stridor. Cardiovascular: Normal rate, regular rhythm. No murmurs, rubs, or gallops. Respiratory: Normal respiratory effort without tachypnea nor retractions. Breath sounds are clear and equal bilaterally. No wheezes/rales/rhonchi. Gastrointestinal: Soft and nontender. Normal bowel sounds Musculoskeletal: Nontender with normal range of motion in extremities. No lower extremity tenderness nor edema. Neurologic: Patient is confused and disoriented.  Best GCS is 12 Skin:  Skin is warm, dry and intact. No rash noted. Psychiatric: Anxious and agitated ____________________________________________  EKG: Interpreted by me.  Sinus rhythm with rate of 83 bpm, normal PR interval, normal QRS size, normal QT, normal axis  ____________________________________________  ED COURSE:  As part of my medical decision making, I reviewed the following data within the [place] History obtained from family if available, nursing notes, old chart and ekg, as well  as notes from prior ED visits. Patient presented for altered mental status, hypoglycemia, we will assess with labs and imaging as indicated at this time.   Procedure Name: Intubation Date/Time: 04/10/2019 11:09 PM Performed by: Earleen Newport, MD Pre-anesthesia Checklist: Patient identified, Patient being monitored, Emergency Drugs available, Timeout performed and Suction available Oxygen Delivery Method: Non-rebreather mask Preoxygenation: Pre-oxygenation with 100% oxygen Induction Type: Rapid sequence Ventilation: Mask ventilation without difficulty Laryngoscope Size: Mac and 4 Grade  View: Grade III Tube size: 7.5 mm Number of attempts: 1 Airway Equipment and Method: Video-laryngoscopy Placement Confirmation: ETT inserted through vocal cords under direct vision,  CO2 detector and Breath sounds checked- equal and bilateral Secured at: 22 cm Tube secured with: ETT holder Dental Injury: Teeth and Oropharynx as per pre-operative assessment  Difficulty Due To: Difficulty was unanticipated    OG placement  Date/Time: 04/10/2019 11:10 PM Performed by: Earleen Newport, MD Authorized by: Earleen Newport, MD  Consent: The procedure was performed in an emergent situation.   OG tube placed after intubation to 60 cm.  Katrina Ortiz was evaluated in Emergency Department on 04/10/2019 for the symptoms described in the history of present illness. She was evaluated in the context of the global COVID-19 pandemic, which necessitated consideration that the patient might be at risk for infection with the SARS-CoV-2 virus that causes COVID-19. Institutional protocols and algorithms that pertain to the evaluation of patients at risk for COVID-19 are in a state of rapid change based on information released by regulatory bodies including the CDC and federal and state organizations. These policies and algorithms were followed during the patient's care in the ED.  ____________________________________________   LABS (pertinent positives/negatives)  Labs Reviewed  CBC WITH DIFFERENTIAL/PLATELET - Abnormal; Notable for the following components:      Result Value   Hemoglobin 10.2 (*)    HCT 34.3 (*)    MCV 77.1 (*)    MCH 22.9 (*)    MCHC 29.7 (*)    RDW 16.6 (*)    Neutro Abs 8.1 (*)    All other components within normal limits  COMPREHENSIVE METABOLIC PANEL - Abnormal; Notable for the following components:   Sodium 134 (*)    Glucose, Bld 261 (*)    Creatinine, Ser 1.03 (*)    Calcium 8.7 (*)    Albumin 2.7 (*)    AST 10 (*)    GFR calc non Af Amer 57 (*)    All other  components within normal limits  URINALYSIS, COMPLETE (UACMP) WITH MICROSCOPIC - Abnormal; Notable for the following components:   Color, Urine YELLOW (*)    APPearance HAZY (*)    Protein, ur 100 (*)    Bacteria, UA FEW (*)    All other components within normal limits  GLUCOSE, CAPILLARY - Abnormal; Notable for the following components:   Glucose-Capillary 46 (*)    All other components within normal limits  BLOOD GAS, VENOUS - Abnormal; Notable for the following components:   pH, Ven 7.22 (*)    pCO2, Ven 72 (*)    pO2, Ven <31.0 (*)    Bicarbonate 29.5 (*)    All other components within normal limits  GLUCOSE, CAPILLARY - Abnormal; Notable for the following components:   Glucose-Capillary 255 (*)    All other components within normal limits  CULTURE, BLOOD (ROUTINE X 2)  CULTURE, BLOOD (ROUTINE X 2)  URINE CULTURE  RESPIRATORY PANEL BY RT PCR (FLU A&B, COVID)  LACTIC ACID,  PLASMA  CBG MONITORING, ED   CRITICAL CARE Performed by: Laurence Aly   Total critical care time: 30 minutes  Critical care time was exclusive of separately billable procedures and treating other patients.  Critical care was necessary to treat or prevent imminent or life-threatening deterioration.  Critical care was time spent personally by me on the following activities: development of treatment plan with patient and/or surrogate as well as nursing, discussions with consultants, evaluation of patient's response to treatment, examination of patient, obtaining history from patient or surrogate, ordering and performing treatments and interventions, ordering and review of laboratory studies, ordering and review of radiographic studies, pulse oximetry and re-evaluation of patient's condition.  RADIOLOGY Images were viewed by me  Chest x-ray, abd xray, CT head IMPRESSION:  1. Endotracheal tube tip about 4.6 cm superior to carina  2. Cardiomegaly with vascular congestion and hazy bilateral   pulmonary opacity suspect for edema. Probable layering effusion on  the right and small left pleural effusion.  IMPRESSION:  Esophageal tube tip overlies gastric fundus, side-port overlies the  GE junction, further advancement by 5-10 cm could be performed for  more optimal positioning.   ____________________________________________   DIFFERENTIAL DIAGNOSIS   Sepsis, dehydration, electrolyte abnormality, overdose, renal failure  FINAL ASSESSMENT AND PLAN  Altered mental status, hypoglycemia, hypothermia, acute respiratory failure with hypercapnia and hypoxemia   Plan: The patient had presented for altered mental status and hypoglycemia. Patient's labs revealed acidemia, hyperglycemia and acute hypercarbia.  Patient was too agitated to placed on BiPAP.  She was intubated as dictated above due to acute respiratory failure with hypercarbia and hypoxemia.  During the intubation process her oxygen levels dropped precipitously.  These were maintained well while she was intubated.  CT head was unremarkable, x-ray with some vascular congestion, otherwise unremarkable.  Unclear etiology for this constellation of symptoms.  I will discuss with the ICU doctor for admission.   Laurence Aly, MD    Note: This note was generated in part or whole with voice recognition software. Voice recognition is usually quite accurate but there are transcription errors that can and very often do occur. I apologize for any typographical errors that were not detected and corrected.     Earleen Newport, MD 04/10/19 [phone]

## 2019-04-10 NOTE — ED Notes (Signed)
Patient to CT.

## 2019-04-10 NOTE — H&P (Signed)
Name: Katrina Ortiz MRN: [id number] DOB: Jan 22, 1956    ADMISSION DATE:  04/10/2019  REFERRING MD : Dr. Jimmye Norman  CHIEF COMPLAINT: Altered Mental Status   BRIEF PATIENT DESCRIPTION:  64 yo female admitted with acute encephalopathy secondary to hypoglycemia and acute hypercapnic respiratory failure secondary to pulmonary edema requiring mechanical intubation   SIGNIFICANT EVENTS/STUDIES:  02/17: Pt admitted to ICU mechanically intubated 02/17: CT Head revealed no acute intracranial abnormality. Normal noncontrast head CT for age.  HISTORY OF PRESENT ILLNESS:   This is a 64 yo female with a PMH of Psoriasis, Type II Diabetes Mellitus, Hypoglycemia, HTN, Complicated UTI (Klebsiella), Bilateral Lower Extremity Cellulitis, Obesity, CAD (drug eluting to RCA 2004), and CABG (2003).  She presented to [name] ER on 02/17 with altered mental status.  Per ER notes family reported the pt was unresponsive for several hours during the day pm 02/17, therefore EMS notified. Upon EMS arrival at pts home pt hypoglycemic with CBG of 37.  She received D10W with CBG initially increasing to 148, however within 5 minutes CBG dropped to 70. EMS reported the pts living conditions were poor with feces on the walls.  Upon arrival to the ER pt alert but disoriented, not following commands, and uncooperative. It was noted she had foul smelling urine. Lab results revealed Na+ 134, glucose 261, creatinine 1.03, albumin 2.7, lactic acid 0.6, hgb 10.2, and vbg pH 7.22/pCO2 72/bicarb 29.5.  UA positive for few bacteria and 6-10 rbc. Vital signs were: temp 93.9 F, hr 84, rr 24, and O2 sats 93% on 4L.  Due to hypercapnia and encephalopathy pt required mechanical intubation.  PCCM team contact by ER physician for ICU admission.    PAST MEDICAL HISTORY :   has a past medical history of Diabetes mellitus without complication ([place]), Hypertension, and Psoriasis.  has a past surgical history that includes Tubal ligation; Hernia repair;  Cesarean section; Coronary artery bypass graft; and Coronary stent placement. Prior to Admission medications   Medication Sig Start Date End Date Taking? Authorizing Provider  dicyclomine (BENTYL) 20 MG tablet Take 1 tablet (20 mg total) by mouth 3 (three) times daily as needed for spasms. 02/27/19   Carrie Mew, MD  naproxen (NAPROSYN) 500 MG tablet Take 1 tablet (500 mg total) by mouth 2 (two) times daily with a meal. 05/02/15   Sable Feil, PA-C  ondansetron (ZOFRAN ODT) 4 MG disintegrating tablet Take 1 tablet (4 mg total) by mouth every 8 (eight) hours as needed for nausea or vomiting. 02/27/19   Carrie Mew, MD  oxyCODONE-acetaminophen (PERCOCET) 5-325 MG tablet Take 1 tablet by mouth every 6 (six) hours as needed for severe pain. 02/27/19 02/27/20  Carrie Mew, MD  sulfamethoxazole-trimethoprim (BACTRIM DS,SEPTRA DS) 800-160 MG tablet Take 1 tablet by mouth 2 (two) times daily. 05/02/15   Sable Feil, PA-C  traMADol (ULTRAM) 50 MG tablet Take 1 tablet (50 mg total) by mouth every 6 (six) hours as needed for moderate pain. 05/02/15   Sable Feil, PA-C   No Known Allergies  FAMILY HISTORY:  family history is not on file. SOCIAL HISTORY:  reports that she quit smoking about 16 years ago. She does not have any smokeless tobacco history on file. She reports that she does not drink alcohol or use drugs.  REVIEW OF SYSTEMS:   Unable to assess pt intubated   SUBJECTIVE:  Unable to assess pt intubated   VITAL SIGNS: Temp:  [93.9 F (34.4 C)] 93.9 F (34.4 C) (02/17  2145) Pulse Rate:  [74-84] 75 (02/17 2258) Resp:  [0-24] 0 (02/17 2258) BP: (162-179)/(71-111) 171/75 (02/17 2258) SpO2:  [93 %-100 %] 100 % (02/17 2258) Weight:  [96.2 kg] 96.2 kg (02/17 2148)  PHYSICAL EXAMINATION: General: acutely ill appearing female, NAD mechanically intubated  Neuro: sedated, no following commands, PERRL HEENT: supple, no JVD  Cardiovascular: nsr, rrr, no R/G  Lungs: clear,  even, non labored  Abdomen: +BX x4. pb Musculoskeletal: normal bulk and tone, no edema  Skin: chronic venous status changes bilateral lower extremities, bilateral lower extremity venous ulcerations  Recent Labs  Lab 04/10/19 2142  NA 134*  K 5.0  CL 100  CO2 25  BUN 21  CREATININE 1.03*  GLUCOSE 261*   Recent Labs  Lab 04/10/19 2142  HGB 10.2*  HCT 34.3*  WBC 9.7  PLT 367   No results found.  ASSESSMENT / PLAN:  Acute hypercapnic respiratory failure secondary to pulmonary edema  Mechanical intubation  Full vent support for now-vent settings reviewed and established SBT once all parameters met  VAP bundle implemented COVID-19/Influenza PCR results pending  IV lasix as blood pressure tolerated  Hypertension  Hx: CAD and CABG  Continuous telemetry monitoring  Prn hydralazine for bp management  Echo pending  Trend troponin's  Mild acute renal failure  Trend BMP Replace electrolytes as indicated  Monitor UOP Avoid nephrotoxic medications  Possible UTI  Trend WBC and monitor fever curve Will check PCT if elevated will start ceftriaxone  Follow cultures    Hypoglycemia  Hx: Type II Diabetes Mellitus  CBG's q2hrs for now  Follow hypoglycemic protocol  D10W '@20'  ml/hr   Acute encephalopathy suspected secondary to hypoglycemia and hypercapnia (CT Head 02/17 negative for acute abnormality) Mechanical intubation pain/discomfort  Maintain RASS goal -1 to -2 for now  Propofol and prn fentanyl to maintain RASS goal WUA daily  Urine drug screen, ethanol level, salicylate level, and acetaminophen levels pending If mentation does not improve in the next 24 hrs will order MR Brain  Best Practice: VTE px: subq lovenox SUP px: iv pepcid Diet: keep NPO for now if pt remains intubated in the next 24hrs will start TF's  Consult social work to assess pt needs due to poor living conditions   Marda Stalker, [place] Pager [phone] (please enter  7 digits) PCCM Consult Pager [phone] (please enter 7 digits)

## 2019-04-11 ENCOUNTER — Inpatient Hospital Stay: Payer: BLUE CROSS/BLUE SHIELD

## 2019-04-11 ENCOUNTER — Inpatient Hospital Stay (HOSPITAL_COMMUNITY)
Admit: 2019-04-11 | Discharge: 2019-04-11 | Disposition: A | Discharge status: Home / Self Care | Payer: BLUE CROSS/BLUE SHIELD | Attending: Critical Care Medicine | Admitting: Critical Care Medicine

## 2019-04-11 DIAGNOSIS — J9622 Acute and chronic respiratory failure with hypercapnia: Secondary | ICD-10-CM

## 2019-04-11 DIAGNOSIS — J9621 Acute and chronic respiratory failure with hypoxia: Secondary | ICD-10-CM

## 2019-04-11 LAB — URINE CULTURE: Culture: NO GROWTH

## 2019-04-11 LAB — BLOOD GAS, ARTERIAL
Acid-Base Excess: 2.9 mmol/L — ABNORMAL HIGH (ref 0.0–2.0)
Bicarbonate: 27.9 mmol/L (ref 20.0–28.0)
FIO2: 0.35
MECHVT: 500 mL
O2 Saturation: 96.6 %
PEEP: 5 cmH2O
Patient temperature: 37
RATE: 16 resp/min
pCO2 arterial: 43 mmHg (ref 32.0–48.0)
pH, Arterial: 7.42 (ref 7.350–7.450)
pO2, Arterial: 85 mmHg (ref 83.0–108.0)

## 2019-04-11 LAB — BASIC METABOLIC PANEL
Anion gap: 9 (ref 5–15)
BUN: 21 mg/dL (ref 8–23)
CO2: 24 mmol/L (ref 22–32)
Calcium: 8.7 mg/dL — ABNORMAL LOW (ref 8.9–10.3)
Chloride: 102 mmol/L (ref 98–111)
Creatinine, Ser: 0.93 mg/dL (ref 0.44–1.00)
GFR calc Af Amer: >60 mL/min (ref >60)
GFR calc non Af Amer: >60 mL/min (ref >60)
Glucose, Bld: 224 mg/dL — ABNORMAL HIGH (ref 70–99)
Potassium: 4.3 mmol/L (ref 3.5–5.1)
Sodium: 135 mmol/L (ref 135–145)

## 2019-04-11 LAB — URINE DRUG SCREEN, QUALITATIVE (ARMC ONLY)
Amphetamines, Ur Screen: NOT DETECTED
Barbiturates, Ur Screen: NOT DETECTED
Benzodiazepine, Ur Scrn: NOT DETECTED
Cannabinoid 50 Ng, Ur ~~LOC~~: NOT DETECTED
Cocaine Metabolite,Ur ~~LOC~~: NOT DETECTED
MDMA (Ecstasy)Ur Screen: NOT DETECTED
Methadone Scn, Ur: NOT DETECTED
Opiate, Ur Screen: NOT DETECTED
Phencyclidine (PCP) Ur S: NOT DETECTED
Tricyclic, Ur Screen: NOT DETECTED

## 2019-04-11 LAB — GLUCOSE, CAPILLARY
Glucose-Capillary: 227 mg/dL — ABNORMAL HIGH (ref 70–99)
Glucose-Capillary: 208 mg/dL — ABNORMAL HIGH (ref 70–99)
Glucose-Capillary: 189 mg/dL — ABNORMAL HIGH (ref 70–99)
Glucose-Capillary: 177 mg/dL — ABNORMAL HIGH (ref 70–99)
Glucose-Capillary: 170 mg/dL — ABNORMAL HIGH (ref 70–99)
Glucose-Capillary: 156 mg/dL — ABNORMAL HIGH (ref 70–99)
Glucose-Capillary: 219 mg/dL — ABNORMAL HIGH (ref 70–99)
Glucose-Capillary: 229 mg/dL — ABNORMAL HIGH (ref 70–99)

## 2019-04-11 LAB — ECHOCARDIOGRAM COMPLETE
Height: 65.984 in
Weight: 3392 oz

## 2019-04-11 LAB — CBC
HCT: 33.7 % — ABNORMAL LOW (ref 36.0–46.0)
Hemoglobin: 10.2 g/dL — ABNORMAL LOW (ref 12.0–15.0)
MCH: 22.7 pg — ABNORMAL LOW (ref 26.0–34.0)
MCHC: 30.3 g/dL (ref 30.0–36.0)
MCV: 75.1 fL — ABNORMAL LOW (ref 80.0–100.0)
Platelets: 335 10*3/uL (ref 150–400)
RBC: 4.49 MIL/uL (ref 3.87–5.11)
RDW: 16.5 % — ABNORMAL HIGH (ref 11.5–15.5)
WBC: 8.2 10*3/uL (ref 4.0–10.5)
nRBC: 0 % (ref 0.0–0.2)

## 2019-04-11 LAB — ETHANOL: Alcohol, Ethyl (B): <10 mg/dL (ref <10)

## 2019-04-11 LAB — MAGNESIUM: Magnesium: 1.6 mg/dL — ABNORMAL LOW (ref 1.7–2.4)

## 2019-04-11 LAB — HEMOGLOBIN A1C
Hgb A1c MFr Bld: 9.9 % — ABNORMAL HIGH (ref 4.8–5.6)
Mean Plasma Glucose: 237.43 mg/dL

## 2019-04-11 LAB — RESPIRATORY PANEL BY RT PCR (FLU A&B, COVID)
Influenza A by PCR: NEGATIVE
Influenza B by PCR: NEGATIVE
SARS Coronavirus 2 by RT PCR: NEGATIVE

## 2019-04-11 LAB — SALICYLATE LEVEL: Salicylate Lvl: <7 mg/dL — ABNORMAL LOW (ref 7.0–30.0)

## 2019-04-11 LAB — PHOSPHORUS: Phosphorus: 3.8 mg/dL (ref 2.5–4.6)

## 2019-04-11 LAB — MRSA PCR SCREENING: MRSA by PCR: NEGATIVE

## 2019-04-11 LAB — PROCALCITONIN: Procalcitonin: <0.1 ng/mL

## 2019-04-11 LAB — HIV ANTIBODY (ROUTINE TESTING W REFLEX): HIV Screen 4th Generation wRfx: NONREACTIVE

## 2019-04-11 LAB — ACETAMINOPHEN LEVEL: Acetaminophen (Tylenol), Serum: <10 ug/mL — ABNORMAL LOW (ref 10–30)

## 2019-04-11 MED ORDER — ORAL CARE MOUTH RINSE
15.0000 mL | OROMUCOSAL | Status: DC
  Administered 2019-04-11 – 2019-04-12 (×11): 15 mL via OROMUCOSAL

## 2019-04-11 MED ORDER — VITAL HIGH PROTEIN PO LIQD
1000.0000 mL | ORAL | Status: DC
  Administered 2019-04-11: 1000 mL

## 2019-04-11 MED ORDER — INSULIN ASPART 100 UNIT/ML ~~LOC~~ SOLN: 0.0000 [IU] | SUBCUTANEOUS | Status: DC

## 2019-04-11 MED ORDER — IPRATROPIUM-ALBUTEROL 0.5-2.5 (3) MG/3ML IN SOLN
3.0000 mL | Freq: Four times a day (QID) | RESPIRATORY_TRACT | Status: DC
  Administered 2019-04-11 – 2019-04-12 (×5): 3 mL via RESPIRATORY_TRACT
  Filled 2019-04-11 (×5): qty 3

## 2019-04-11 MED ORDER — ADULT MULTIVITAMIN LIQUID CH
15.0000 mL | Freq: Every day | ORAL | Status: DC
  Administered 2019-04-11: 15 mL
  Filled 2019-04-11: qty 15

## 2019-04-11 MED ORDER — AZITHROMYCIN 500 MG PO TABS
500.0000 mg | ORAL_TABLET | Freq: Every day | ORAL | Status: DC
  Administered 2019-04-12 – 2019-04-14 (×3): 500 mg via ORAL
  Filled 2019-04-11 (×5): qty 1

## 2019-04-11 MED ORDER — METHYLPREDNISOLONE SODIUM SUCC 40 MG IJ SOLR
20.0000 mg | Freq: Two times a day (BID) | INTRAMUSCULAR | Status: DC
  Administered 2019-04-11 – 2019-04-12 (×3): 20 mg via INTRAVENOUS
  Filled 2019-04-11 (×3): qty 1

## 2019-04-11 MED ORDER — MAGNESIUM SULFATE 2 GM/50ML IV SOLN
2.0000 g | Freq: Once | INTRAVENOUS | Status: AC
  Administered 2019-04-11: 06:00:00 2 g via INTRAVENOUS
  Filled 2019-04-11: qty 50

## 2019-04-11 MED ORDER — CHLORHEXIDINE GLUCONATE CLOTH 2 % EX PADS
6.0000 | MEDICATED_PAD | Freq: Every day | CUTANEOUS | Status: DC
  Administered 2019-04-11 – 2019-04-14 (×3): 6 via TOPICAL
  Filled 2019-04-11: qty 6

## 2019-04-11 MED ORDER — AZITHROMYCIN 500 MG PO TABS
500.0000 mg | ORAL_TABLET | Freq: Every day | ORAL | Status: DC
  Administered 2019-04-11: 500 mg
  Filled 2019-04-11: qty 1

## 2019-04-11 MED ORDER — FUROSEMIDE 10 MG/ML IJ SOLN
40.0000 mg | Freq: Once | INTRAMUSCULAR | Status: AC
  Administered 2019-04-11: 03:00:00 40 mg via INTRAVENOUS
  Filled 2019-04-11: qty 4

## 2019-04-11 MED ORDER — ACETAMINOPHEN 325 MG PO TABS: 650.0000 mg | ORAL_TABLET | ORAL | Status: DC | PRN

## 2019-04-11 MED ORDER — SODIUM CHLORIDE 0.9 % IV SOLN
2.0000 g | INTRAVENOUS | Status: DC
  Administered 2019-04-11 – 2019-04-12 (×2): 2 g via INTRAVENOUS
  Filled 2019-04-11: qty 20
  Filled 2019-04-11 (×2): qty 2

## 2019-04-11 MED ORDER — PRO-STAT SUGAR FREE PO LIQD
60.0000 mL | Freq: Three times a day (TID) | ORAL | Status: DC
  Administered 2019-04-11 (×2): 60 mL

## 2019-04-11 MED ORDER — CHLORHEXIDINE GLUCONATE 0.12% ORAL RINSE (MEDLINE KIT)
15.0000 mL | Freq: Two times a day (BID) | OROMUCOSAL | Status: DC
  Administered 2019-04-11 – 2019-04-12 (×3): 15 mL via OROMUCOSAL

## 2019-04-11 MED ORDER — BUDESONIDE 0.5 MG/2ML IN SUSP
0.5000 mg | Freq: Two times a day (BID) | RESPIRATORY_TRACT | Status: DC
  Administered 2019-04-11 – 2019-04-14 (×6): 0.5 mg via RESPIRATORY_TRACT
  Filled 2019-04-11 (×6): qty 2

## 2019-04-11 MED ORDER — INSULIN ASPART 100 UNIT/ML ~~LOC~~ SOLN
0.0000 [IU] | SUBCUTANEOUS | Status: DC
  Administered 2019-04-11: 2 [IU] via SUBCUTANEOUS
  Administered 2019-04-11 (×2): 3 [IU] via SUBCUTANEOUS
  Filled 2019-04-11 (×2): qty 1

## 2019-04-11 MED ORDER — ALBUTEROL SULFATE (2.5 MG/3ML) 0.083% IN NEBU: 2.5000 mg | INHALATION_SOLUTION | RESPIRATORY_TRACT | Status: DC | PRN

## 2019-04-11 NOTE — Progress Notes (Signed)
eLink Physician-Brief Progress Note Patient Name: Katrina Ortiz DOB: Sep 29, 1955 MRN: [id number]   Date of Service  04/11/2019  HPI/Events of Note  Pt admitted to the ICU with failure to thrive, altered mental status, hypoglycemia and hypercapnic respiratory failure.  eICU Interventions  New Patient Evaluation completed.        Katrina Ortiz Katrina Ortiz 04/11/2019, 2:10 AM

## 2019-04-11 NOTE — Progress Notes (Signed)
Pt awake and following commands currently on 3L O2 via nasal canula with O2 sat 100%, other vital signs remain stable will continue to monitor and assess pt.   Marda Stalker, [place] Pager [phone] (please enter 7 digits) PCCM Consult Pager [phone] (please enter 7 digits)

## 2019-04-11 NOTE — Progress Notes (Signed)
Initial Nutrition Assessment  RD working remotely.  DOCUMENTATION CODES:   Obesity unspecified  INTERVENTION:  Initiate Vital High Protein at 15 mL/hr (360 mL goal daily volume) + Pro-Stat 60 mL TID per tube. Provides 960 kcal, 121.5 grams of protein, 302 mL H2O daily. With current propofol rate provides 1723 kcal daily.  Provide minimum free water flush of 30 mL Q4hrs to maintain tube patency.  Provide liquid MVI daily per tube.  NUTRITION DIAGNOSIS:   Inadequate oral intake related to inability to eat as evidenced by NPO status.  GOAL:   Provide needs based on ASPEN/SCCM guidelines  MONITOR:   Vent status, Labs, Weight trends, TF tolerance, Skin, I & O's  REASON FOR ASSESSMENT:   Ventilator    ASSESSMENT:   64 year old female with PMHx of HTN, DM, psoriasis admitted with COPD exacerbation and PNA requiring intubation.   Patient is currently intubated on ventilator support MV: 7.3 L/min Temp (24hrs), Avg:96.7 F (35.9 C), Min:93.1 F (33.9 C), Max:99.5 F (37.5 C)  Propofol: 28.9 ml/hr (763 kcal daily)  Medications reviewed and include: azithromycin, Solu-Medrol 40 mg Q12hrs IV, ceftriaxone, D10 at 10 mL/hr, famotidine, propofol gtt.  Labs reviewed: CBG 170-189, Magnesium 1.6.  Enteral Access: OGT now terminates in stomach after advancement to 55 cm overnight  Discussed with RN via secure chat. Plan is to start tube feeds today.  NUTRITION - FOCUSED PHYSICAL EXAM:  Unable to complete at this time as RD is working remotely.  Diet Order:   Diet Order            Diet NPO time specified  Diet effective now             EDUCATION NEEDS:   No education needs have been identified at this time  Skin:  Skin Assessment: Reviewed RN Assessment  Last BM:  Unknown  Height:   Ht Readings from Last 1 Encounters:  04/11/19 5' 5.98" (1.676 m)   Weight:   Wt Readings from Last 1 Encounters:  04/10/19 96.2 kg   Ideal Body Weight:  59 kg  BMI:  Body  mass index is 34.23 kg/m.  Estimated Nutritional Needs:   Kcal:  [id number] (11-14 kcal/kg)  Protein:  118 grams (2 grams/kg IBW)  Fluid:  1.5-1.8 L/day  Jacklynn Barnacle, MS, RD, LDN Pager number available on Amion

## 2019-04-11 NOTE — Progress Notes (Signed)
Inpatient Diabetes Program Recommendations  AACE/ADA: New Consensus Statement on Inpatient Glycemic Control   Target Ranges:  Prepandial:   less than 140 mg/dL      Peak postprandial:   less than 180 mg/dL (1-2 hours)      Critically ill patients:  140 - 180 mg/dL   Results for MARETA, MERVIS (MRN [id number]) as of 04/11/2019 12:40  Ref. Range 04/10/2019 21:42 04/10/2019 22:03 04/11/2019 01:03 04/11/2019 02:00 04/11/2019 04:27 04/11/2019 06:44 04/11/2019 07:50 04/11/2019 11:24  Glucose-Capillary Latest Ref Range: 70 - 99 mg/dL 46 (L) 255 (H) 227 (H) 208 (H) 189 (H) 177 (H) 170 (H) 156 (H)   Review of Glycemic Control  Diabetes history: DM2 Outpatient Diabetes medications: Lantus 10 units QHS, Metformin 1000 mg BID Current orders for Inpatient glycemic control: CBG monitoring; Solumedrol 20 mg Q12H, Vital @ 15 ml/hr  Inpatient Diabetes Program Recommendations:    Insulin-Correction: Please consider ordering Novolog 0-6 units Q4H.  Thanks, Barnie Alderman, RN, MSN, CDE Diabetes Coordinator Inpatient Diabetes Program [phone] (Team Pager from 8am to 5pm)

## 2019-04-11 NOTE — Progress Notes (Signed)
*  PRELIMINARY RESULTS* Echocardiogram 2D Echocardiogram has been performed.  Wallie Char Zyona [place] 04/11/2019, 9:00 AM

## 2019-04-11 NOTE — Progress Notes (Signed)
OG advanced to 55 cm per NP.

## 2019-04-11 NOTE — Progress Notes (Signed)
Patient self extubated at approximately 1930. Patient was placed on NRBM, SATs at 100%, then transitioned to 3L [place] with SATs maintaining at 100%. No stridor noted, no distress observed. Scheduled breathing treatment give. Will continue to monitor.

## 2019-04-11 NOTE — Significant Event (Signed)
Notified by nursing staff pt self extubated, upon arrival at bedside pt on NRB with O2 sats @100 % other vital signs stable.  She attempted to follow commands, however she is very weak.  At this time she does not require reintubation.  Will continue to monitor and assess pt.  Marda Stalker, [place] Pager [phone] (please enter 7 digits) PCCM Consult Pager [phone] (please enter 7 digits)

## 2019-04-12 DIAGNOSIS — G9340 Encephalopathy, unspecified: Secondary | ICD-10-CM

## 2019-04-12 DIAGNOSIS — J9602 Acute respiratory failure with hypercapnia: Secondary | ICD-10-CM

## 2019-04-12 DIAGNOSIS — E162 Hypoglycemia, unspecified: Secondary | ICD-10-CM

## 2019-04-12 LAB — CBC WITH DIFFERENTIAL/PLATELET
Abs Immature Granulocytes: 0.07 10*3/uL (ref 0.00–0.07)
Basophils Absolute: 0 10*3/uL (ref 0.0–0.1)
Basophils Relative: 0 %
Eosinophils Absolute: 0 10*3/uL (ref 0.0–0.5)
Eosinophils Relative: 0 %
HCT: 31.6 % — ABNORMAL LOW (ref 36.0–46.0)
Hemoglobin: 9.6 g/dL — ABNORMAL LOW (ref 12.0–15.0)
Immature Granulocytes: 1 %
Lymphocytes Relative: 4 %
Lymphs Abs: 0.4 10*3/uL — ABNORMAL LOW (ref 0.7–4.0)
MCH: 22.9 pg — ABNORMAL LOW (ref 26.0–34.0)
MCHC: 30.4 g/dL (ref 30.0–36.0)
MCV: 75.2 fL — ABNORMAL LOW (ref 80.0–100.0)
Monocytes Absolute: 0.3 10*3/uL (ref 0.1–1.0)
Monocytes Relative: 3 %
Neutro Abs: 10 10*3/uL — ABNORMAL HIGH (ref 1.7–7.7)
Neutrophils Relative %: 92 %
Platelets: 347 10*3/uL (ref 150–400)
RBC: 4.2 MIL/uL (ref 3.87–5.11)
RDW: 16.8 % — ABNORMAL HIGH (ref 11.5–15.5)
WBC: 10.8 10*3/uL — ABNORMAL HIGH (ref 4.0–10.5)
nRBC: 0 % (ref 0.0–0.2)

## 2019-04-12 LAB — BASIC METABOLIC PANEL
Anion gap: 12 (ref 5–15)
BUN: 18 mg/dL (ref 8–23)
CO2: 25 mmol/L (ref 22–32)
Calcium: 8.5 mg/dL — ABNORMAL LOW (ref 8.9–10.3)
Chloride: 104 mmol/L (ref 98–111)
Creatinine, Ser: 0.91 mg/dL (ref 0.44–1.00)
GFR calc Af Amer: >60 mL/min (ref >60)
GFR calc non Af Amer: >60 mL/min (ref >60)
Glucose, Bld: 250 mg/dL — ABNORMAL HIGH (ref 70–99)
Potassium: 4.5 mmol/L (ref 3.5–5.1)
Sodium: 141 mmol/L (ref 135–145)

## 2019-04-12 LAB — PHOSPHORUS: Phosphorus: 5.7 mg/dL — ABNORMAL HIGH (ref 2.5–4.6)

## 2019-04-12 LAB — T4, FREE: Free T4: 1.51 ng/dL — ABNORMAL HIGH (ref 0.61–1.12)

## 2019-04-12 LAB — GLUCOSE, CAPILLARY
Glucose-Capillary: 226 mg/dL — ABNORMAL HIGH (ref 70–99)
Glucose-Capillary: 257 mg/dL — ABNORMAL HIGH (ref 70–99)
Glucose-Capillary: 191 mg/dL — ABNORMAL HIGH (ref 70–99)
Glucose-Capillary: 211 mg/dL — ABNORMAL HIGH (ref 70–99)
Glucose-Capillary: 407 mg/dL — ABNORMAL HIGH (ref 70–99)

## 2019-04-12 LAB — MAGNESIUM: Magnesium: 2.1 mg/dL (ref 1.7–2.4)

## 2019-04-12 LAB — TSH: TSH: 0.263 u[IU]/mL — ABNORMAL LOW (ref 0.350–4.500)

## 2019-04-12 MED ORDER — INSULIN GLARGINE 100 UNIT/ML ~~LOC~~ SOLN
10.0000 [IU] | Freq: Every day | SUBCUTANEOUS | Status: DC
  Administered 2019-04-12 – 2019-04-14 (×3): 10 [IU] via SUBCUTANEOUS
  Filled 2019-04-12 (×3): qty 0.1

## 2019-04-12 MED ORDER — IPRATROPIUM-ALBUTEROL 0.5-2.5 (3) MG/3ML IN SOLN
3.0000 mL | Freq: Three times a day (TID) | RESPIRATORY_TRACT | Status: DC
  Administered 2019-04-12 – 2019-04-13 (×3): 3 mL via RESPIRATORY_TRACT
  Filled 2019-04-12 (×3): qty 3

## 2019-04-12 MED ORDER — INSULIN ASPART 100 UNIT/ML ~~LOC~~ SOLN
0.0000 [IU] | SUBCUTANEOUS | Status: DC
  Administered 2019-04-12: 3 [IU] via SUBCUTANEOUS
  Administered 2019-04-12: 2 [IU] via SUBCUTANEOUS
  Administered 2019-04-12: 5 [IU] via SUBCUTANEOUS
  Filled 2019-04-12 (×3): qty 1

## 2019-04-12 MED ORDER — INSULIN ASPART 100 UNIT/ML ~~LOC~~ SOLN
0.0000 [IU] | Freq: Every day | SUBCUTANEOUS | Status: DC
  Administered 2019-04-12: 5 [IU] via SUBCUTANEOUS
  Filled 2019-04-12: qty 1

## 2019-04-12 MED ORDER — GUAIFENESIN ER 600 MG PO TB12: 600.0000 mg | ORAL_TABLET | Freq: Two times a day (BID) | ORAL | Status: DC | PRN

## 2019-04-12 MED ORDER — INSULIN ASPART 100 UNIT/ML ~~LOC~~ SOLN
0.0000 [IU] | Freq: Three times a day (TID) | SUBCUTANEOUS | Status: DC
  Administered 2019-04-13: 3 [IU] via SUBCUTANEOUS
  Administered 2019-04-13: 1 [IU] via SUBCUTANEOUS
  Administered 2019-04-13 – 2019-04-14 (×3): 2 [IU] via SUBCUTANEOUS
  Filled 2019-04-12 (×5): qty 1

## 2019-04-12 MED ORDER — INSULIN ASPART 100 UNIT/ML ~~LOC~~ SOLN
5.0000 [IU] | Freq: Once | SUBCUTANEOUS | Status: AC
  Administered 2019-04-12: 5 [IU] via SUBCUTANEOUS

## 2019-04-12 MED ORDER — FAMOTIDINE 20 MG PO TABS
20.0000 mg | ORAL_TABLET | Freq: Every day | ORAL | Status: DC
  Administered 2019-04-12 – 2019-04-13 (×2): 20 mg via ORAL
  Filled 2019-04-12 (×2): qty 1

## 2019-04-12 NOTE — Progress Notes (Signed)
Inpatient Diabetes Program Recommendations  AACE/ADA: New Consensus Statement on Inpatient Glycemic Control (2015)  Target Ranges:  Prepandial:   less than 140 mg/dL      Peak postprandial:   less than 180 mg/dL (1-2 hours)      Critically ill patients:  140 - 180 mg/dL   Lab Results  Component Value Date   GLUCAP 257 (H) 04/12/2019   HGBA1C 9.9 (H) 04/11/2019    Review of Glycemic Control Results for Katrina Ortiz, Katrina Ortiz (MRN [id number]) as of 04/12/2019 10:46  Ref. Range 04/11/2019 11:24 04/11/2019 16:02 04/11/2019 19:46 04/12/2019 03:48 04/12/2019 07:33  Glucose-Capillary Latest Ref Range: 70 - 99 mg/dL 156 (H) 219 (H) 229 (H) 226 (H) 257 (H)   Diabetes history: DM 2 Outpatient Diabetes medications: Lantus 10 units QHS, Metformin 1000 mg BID Current orders for Inpatient glycemic control:   Solumedrol 20 mg Q12H Novolog sensitive q 4 hours  Inpatient Diabetes Program Recommendations:    If appropriate, please add Lantus 10 units daily.   Thanks  Adah Perl, RN, BC-ADM Inpatient Diabetes Coordinator Pager [phone] (8a-5p)

## 2019-04-12 NOTE — Progress Notes (Signed)
Name: Katrina Ortiz MRN: [id number] DOB: 1955-07-15    ADMISSION DATE:  04/10/2019  REFERRING MD : Dr. Jimmye Norman  CHIEF COMPLAINT: Altered Mental Status   BRIEF PATIENT DESCRIPTION:  64 yo female admitted with acute encephalopathy secondary to hypoglycemia and acute hypercapnic respiratory failure secondary to pulmonary edema requiring mechanical ventilation.  SIGNIFICANT EVENTS/STUDIES:  02/17: Pt admitted to ICU mechanically intubated 02/17: CT Head revealed no acute intracranial abnormality. Normal noncontrast head CT for age. 02/18: Self extubated without sequela  HISTORY OF PRESENT ILLNESS:   This is a 64 yo female with a PMH of Psoriasis, Type II Diabetes Mellitus, Hypoglycemia, HTN, Complicated UTI (Klebsiella), Bilateral Lower Extremity Cellulitis, Obesity, CAD (drug eluting to RCA 2004), and CABG (2003).  She presented to [facility] ER on 02/17 with altered mental status.  Per ER notes family reported the pt was unresponsive for several hours during the day pm 02/17, therefore EMS notified. Upon EMS arrival at pts home pt hypoglycemic with CBG of 37.  She received D10W with CBG initially increasing to 148, however within 5 minutes CBG dropped to 70. EMS reported the pts living conditions were poor with feces on the walls.  Upon arrival to the ER pt alert but disoriented, not following commands, and uncooperative. It was noted she had foul smelling urine. Lab results revealed Na+ 134, glucose 261, creatinine 1.03, albumin 2.7, lactic acid 0.6, hgb 10.2, and vbg pH 7.22/pCO2 72/bicarb 29.5.  UA positive for few bacteria and 6-10 rbc. Vital signs were: temp 93.9 F, hr 84, rr 24, and O2 sats 93% on 4L.  Due to hypercapnia and encephalopathy pt required mechanical intubation.  PCCM team contact by ER physician for ICU admission.     Current Facility-Administered Medications:  .  acetaminophen (TYLENOL) tablet 650 mg, 650 mg, Oral, Q4H PRN, Awilda Bill, NP .  albuterol (PROVENTIL) (2.5  MG/3ML) 0.083% nebulizer solution 2.5 mg, 2.5 mg, Nebulization, Q4H PRN, Mortimer Fries, Kurian, MD .  azithromycin (ZITHROMAX) tablet 500 mg, 500 mg, Oral, Daily, Blakeney, Dana G, NP, 500 mg at 04/12/19 1055 .  budesonide (PULMICORT) nebulizer solution 0.5 mg, 0.5 mg, Nebulization, BID, Kasa, Kurian, MD, 0.5 mg at 04/12/19 2015 .  cefTRIAXone (ROCEPHIN) 2 g in sodium chloride 0.9 % 100 mL IVPB, 2 g, Intravenous, Q24H, Kasa, Kurian, MD, Last Rate: 200 mL/hr at 04/12/19 1108, 2 g at 04/12/19 1108 .  Chlorhexidine Gluconate Cloth 2 % PADS 6 each, 6 each, Topical, Daily, Flora Lipps, MD, 6 each at 04/11/19 2300 .  enoxaparin (LOVENOX) injection 40 mg, 40 mg, Subcutaneous, Q24H, Blakeney, Dreama Saa, NP, 40 mg at 04/12/19 0902 .  famotidine (PEPCID) IVPB 20 mg premix, 20 mg, Intravenous, Q12H, Blakeney, Dreama Saa, NP, Last Rate: 100 mL/hr at 04/12/19 0917, 20 mg at 04/12/19 0917 .  insulin aspart (novoLOG) injection 0-5 Units, 0-5 Units, Subcutaneous, QHS, Darel Hong D, NP .  Derrill Memo ON 04/13/2019] insulin aspart (novoLOG) injection 0-9 Units, 0-9 Units, Subcutaneous, TID WC, Darel Hong D, NP .  insulin glargine (LANTUS) injection 10 Units, 10 Units, Subcutaneous, Daily, Tyler Pita, MD, 10 Units at 04/12/19 1434 .  ipratropium-albuterol (DUONEB) 0.5-2.5 (3) MG/3ML nebulizer solution 3 mL, 3 mL, Nebulization, TID, Tyler Pita, MD, 3 mL at 04/12/19 2015 .  methylPREDNISolone sodium succinate (SOLU-MEDROL) 40 mg/mL injection 20 mg, 20 mg, Intravenous, Q12H, Kasa, Kurian, MD, 20 mg at 04/12/19 1104 .  ondansetron (ZOFRAN) injection 4 mg, 4 mg, Intravenous, Q6H PRN, Awilda Bill, NP  No Known Allergies   SUBJECTIVE:  Speech is fluent.  She still appears befuddled, somewhat disoriented but follows commands.  Seems to understand that she is not thinking clearly.  Does not complain of any pain.  No complaints respiratory wise after extubation.  In no distress.  REVIEW OF SYSTEMS:   Limited due  to the patient's somewhat befuddled mental status.  Otherwise as above  VITAL SIGNS: Temp:  [98.2 F (36.8 C)-99.3 F (37.4 C)] 98.3 F (36.8 C) (02/19 1930) Pulse Rate:  [94-103] 101 (02/19 1900) Resp:  [16] 16 (02/19 1343) BP: (146-179)/(66-99) 160/72 (02/19 1900) SpO2:  [89 %-98 %] 95 % (02/19 2016) FiO2 (%):  [28 %] 28 % (02/19 2016)  PHYSICAL EXAMINATION: General: Chronically ill appearing female, NAD, speech clear and fluent. Neuro: Befuddled, slightly disoriented but easily reoriented, PERRL, no overt focal deficit HEENT: Normocephalic, no trauma.  Oral mucosa moist. Neck: Supple, no JVD, trachea midline, no crepitus Cardiovascular: nsr, rrr, no R/G  Lungs: Good air entry bilateral.  No adventitious sounds. Abdomen: Nondistended, soft, nontender, normoactive bowel sounds Musculoskeletal: normal bulk and tone, no edema  Skin: chronic venous stasis changes bilateral lower extremities, bilateral lower extremity venous ulcerations various stages of healing  Recent Labs  Lab 04/10/19 2142 04/11/19 0223 04/12/19 0513  NA 134* 135 141  K 5.0 4.3 4.5  CL 100 102 104  CO2 25 24 25   BUN 21 21 18   CREATININE 1.03* 0.93 0.91  GLUCOSE 261* 224* 250*   Recent Labs  Lab 04/10/19 2142 04/11/19 0223 04/12/19 0513  HGB 10.2* 10.2* 9.6*  HCT 34.3* 33.7* 31.6*  WBC 9.7 8.2 10.8*  PLT 367 335 347   DG Chest 1 View  Result Date: 04/10/2019 CLINICAL DATA:  Altered mental status post intubation EXAM: CHEST  1 VIEW COMPARISON:  None. FINDINGS: Post sternotomy changes. Endotracheal tube tip is about 4.6 cm superior to the carina. Esophageal tube tip projects over the left upper quadrant. Right greater than left pleural effusion. Cardiomegaly with vascular congestion and hazy right greater than left pulmonary opacity likely combination of edema and pleural effusion. Aortic atherosclerosis. IMPRESSION: 1. Endotracheal tube tip about 4.6 cm superior to carina 2. Cardiomegaly with vascular  congestion and hazy bilateral pulmonary opacity suspect for edema. Probable layering effusion on the right and small left pleural effusion. Electronically Signed   By: Donavan Foil M.D.   On: 04/10/2019 23:15   DG Abdomen 1 View  Result Date: 04/10/2019 CLINICAL DATA:  OG placement EXAM: ABDOMEN - 1 VIEW COMPARISON:  CT 02/27/2019 FINDINGS: Small pleural effusions. Esophageal tube tip overlies the gastric fundus, side-port projects over the GE junction. IMPRESSION: Esophageal tube tip overlies gastric fundus, side-port overlies the GE junction, further advancement by 5-10 cm could be performed for more optimal positioning. Electronically Signed   By: Donavan Foil M.D.   On: 04/10/2019 23:16   CT Head Wo Contrast  Result Date: 04/10/2019 CLINICAL DATA:  Encephalopathy. Altered mental status. EXAM: CT HEAD WITHOUT CONTRAST TECHNIQUE: Contiguous axial images were obtained from the base of the skull through the vertex without intravenous contrast. COMPARISON:  None. FINDINGS: Brain: No intracranial hemorrhage, mass effect, or midline shift. Brain volume is normal for age. No hydrocephalus. The basilar cisterns are patent. Minor periventricular white matter hypodensity, nonspecific but typically chronic small vessel ischemia. No evidence of territorial infarct or acute ischemia. No extra-axial or intracranial fluid collection. Vascular: Atherosclerosis of skullbase vasculature without hyperdense vessel or abnormal calcification. Skull: No fracture  or acute abnormality. Slight cortical thickening of the outer table of the right posterior frontal bone appears benign. Sinuses/Orbits: Paranasal sinuses and mastoid air cells are clear. The visualized orbits are unremarkable. Other: None. IMPRESSION: No acute intracranial abnormality. Normal noncontrast head CT for age. Electronically Signed   By: Keith Rake M.D.   On: 04/10/2019 23:27   DG Chest Port 1 View  Result Date: 04/11/2019 CLINICAL DATA:  Acute  respiratory failure EXAM: PORTABLE CHEST 1 VIEW COMPARISON:  Yesterday FINDINGS: Endotracheal tube tip is at the clavicular heads. The orogastric tube at least reaches the stomach. Cardiomegaly. CABG. Hazy opacity of the right more than left lower chest at least partially from pleural fluid on the right. No visible pneumothorax IMPRESSION: 1. Stable hardware positioning. 2. Hazy bilateral pulmonary opacity with layering pleural effusion on the right at least. Electronically Signed   By: Monte Fantasia M.D.   On: 04/11/2019 04:24   ECHOCARDIOGRAM COMPLETE  Result Date: 04/11/2019    ECHOCARDIOGRAM REPORT   Patient Name:   Katrina Ortiz Date of Exam: 04/11/2019 Medical Rec #:  [id number]      Height:       66.0 in Accession #:    [id number]     Weight:       212.0 lb Date of Birth:  31-Aug-1955     BSA:          2.05 m Patient Age:    59 years       BP:           166/67 mmHg Patient Gender: F              HR:           85 bpm. Exam Location:  [facility] Procedure: 2D Echo, Color Doppler and Cardiac Doppler Indications:     R06.89 Acute Respiratory Insufficiency  History:         Patient has no prior history of Echocardiogram examinations.                  Risk Factors:Hypertension and Diabetes.  Sonographer:     Charmayne Sheer RDCS (AE) Referring Phys:  [id number] Awilda Bill Diagnosing Phys: Ida Rogue MD  Sonographer Comments: Echo performed with patient supine and on artificial respirator. IMPRESSIONS  1. Left ventricular ejection fraction, by estimation, is 60 to 65%. The left ventricle has normal function. The left ventricle has no regional wall motion abnormalities. Left ventricular diastolic parameters are consistent with Grade I diastolic dysfunction (impaired relaxation).  2. Right ventricular systolic function is normal. The right ventricular size is normal. Tricuspid regurgitation signal is inadequate for assessing PA pressure. FINDINGS  Left Ventricle: Left ventricular ejection fraction, by estimation, is  60 to 65%. The left ventricle has normal function. The left ventricle has no regional wall motion abnormalities. The left ventricular internal cavity size was normal in size. There is  no left ventricular hypertrophy. Left ventricular diastolic parameters are consistent with Grade I diastolic dysfunction (impaired relaxation). Right Ventricle: The right ventricular size is normal. No increase in right ventricular wall thickness. Right ventricular systolic function is normal. Tricuspid regurgitation signal is inadequate for assessing PA pressure. Left Atrium: Left atrial size was normal in size. Right Atrium: Right atrial size was normal in size. Pericardium: There is no evidence of pericardial effusion. Mitral Valve: The mitral valve is normal in structure and function. Normal mobility of the mitral valve leaflets. Trivial mitral valve regurgitation. No evidence of mitral valve stenosis.  MV peak gradient, 7.1 mmHg. The mean mitral valve gradient is 3.0 mmHg. Tricuspid Valve: The tricuspid valve is normal in structure. Tricuspid valve regurgitation is not demonstrated. No evidence of tricuspid stenosis. Aortic Valve: The aortic valve was not well visualized. Aortic valve regurgitation is not visualized. No aortic stenosis is present. Aortic valve mean gradient measures 4.0 mmHg. Aortic valve peak gradient measures 8.1 mmHg. Aortic valve area, by VTI measures 1.82 cm. Pulmonic Valve: The pulmonic valve was normal in structure. Pulmonic valve regurgitation is not visualized. No evidence of pulmonic stenosis. Aorta: The aortic root is normal in size and structure. Venous: The inferior vena cava is normal in size with greater than 50% respiratory variability, suggesting right atrial pressure of 3 mmHg. IAS/Shunts: No atrial level shunt detected by color flow Doppler.  LEFT VENTRICLE PLAX 2D LVIDd:         5.60 cm     Diastology LVIDs:         3.70 cm     LV e' lateral:   7.07 cm/s LV PW:         0.84 cm     LV E/e'  lateral: 13.2 LV IVS:        0.76 cm     LV e' medial:    4.79 cm/s LVOT diam:     1.80 cm     LV E/e' medial:  19.4 LV SV:         47.33 ml LV SV Index:   44.36 LVOT Area:     2.54 cm  LV Volumes (MOD) LV vol d, MOD A2C: 80.2 ml LV vol d, MOD A4C: 97.8 ml LV vol s, MOD A2C: 33.6 ml LV vol s, MOD A4C: 39.7 ml LV SV MOD A2C:     46.6 ml LV SV MOD A4C:     97.8 ml LV SV MOD BP:      52.4 ml RIGHT VENTRICLE RV Basal diam:  3.11 cm LEFT ATRIUM             Index       RIGHT ATRIUM           Index LA diam:        3.90 cm 1.90 cm/m  RA Area:     10.60 cm LA Vol (A2C):   39.4 ml 19.22 ml/m RA Volume:   22.70 ml  11.07 ml/m LA Vol (A4C):   43.1 ml 21.02 ml/m LA Biplane Vol: 43.9 ml 21.42 ml/m  AORTIC VALVE                   PULMONIC VALVE AV Area (Vmax):    1.79 cm    PV Vmax:       1.12 m/s AV Area (Vmean):   1.85 cm    PV Vmean:      77.900 cm/s AV Area (VTI):     1.82 cm    PV VTI:        0.247 m AV Vmax:           142.00 cm/s PV Peak grad:  5.0 mmHg AV Vmean:          95.400 cm/s PV Mean grad:  3.0 mmHg AV VTI:            0.260 m AV Peak Grad:      8.1 mmHg AV Mean Grad:      4.0 mmHg LVOT Vmax:         99.80 cm/s  LVOT Vmean:        69.200 cm/s LVOT VTI:          0.186 m LVOT/AV VTI ratio: 0.72  AORTA Ao Root diam: 2.80 cm MITRAL VALVE MV Area (PHT): 4.68 cm     SHUNTS MV Peak grad:  7.1 mmHg     Systemic VTI:  0.19 m MV Mean grad:  3.0 mmHg     Systemic Diam: 1.80 cm MV Vmax:       1.33 m/s MV Vmean:      86.2 cm/s MV Decel Time: 162 msec MV E velocity: 93.10 cm/s MV A velocity: 120.00 cm/s MV E/A ratio:  0.78 Ida Rogue MD Electronically signed by Ida Rogue MD Signature Date/Time: 04/11/2019/11:26:59 AM    Final     ASSESSMENT / PLAN:  Acute hypoxic/hypercapnic respiratory failure secondary to pulmonary edema  Status post mechanical ventilation, self extubated 2/18, no sequela Issue has resolved Not requiring supplemental oxygen  Hypertension  Hx: CAD and CABG  Continuous telemetry  monitoring  As needed hydralazine for BP management  Echo shows diastolic dysfunction BP control will be crucial Troponins have been negative  Mild acute renal failure  Trend BMP Replace electrolytes as indicated  Monitor UOP Avoid nephrotoxic medications Resolved   Possible UTI- R/O Cultures negative Procalcitonin negative Discontinue Rocephin  Type II Diabetes Mellitus, poor control Episodes of hypoglycemia CBG's q2hrs for now  Follow hypoglycemic protocol  Hyperglycemic currently Start Lantus 10 units daily per diabetes coordinator recommendations  Acute encephalopathy suspected secondary to hypoglycemia and hypercapnia (CT Head 02/17 negative for acute abnormality) Mentation clearing though still somewhat confused  Urine drug screen, ethanol level, salicylate level, and acetaminophen levels: Negative CT head negative Has shown improvement today Consider MRI if fails to clear completely  Best Practice: VTE px: subq lovenox SUP px: iv pepcid Diet: Initiate p.o.'s Consulted social work to assess pt needs due to poor living conditions     Multidisciplinary rounds were performed with the ICU team.  Transfer to hospitalist team in the a.m.  May transition to MedSurg status if remains stable.  Renold Don, MD [facility]  *This note was dictated using voice recognition software/Dragon.  Despite best efforts to proofread, errors can occur which can change the meaning.  Any change was purely unintentional.

## 2019-04-12 NOTE — Progress Notes (Signed)
Patient voided after foley discontinued.

## 2019-04-12 NOTE — Progress Notes (Addendum)
Mentation and mental slowing improving since this am. Patient realizes she has some issues with answering complicated questions or 2 part questions. O2 resumed at 2 L Nasal Canula because saturations drop when she dozed off to sleep. Husband has called several times today. Son wishes to be called before husband for decisions- states step father has cognitive issues.Son is in process of becoming health care POA. Bedside swallow evaluation done with success- tolerating fluids well. Started on full liquid diet.

## 2019-04-12 NOTE — Progress Notes (Signed)
Ch visited with Pt in response to recommendation by Ch. Copeland. Pt was on the phone upon Ch's arrival. Pt reported to Ch that she does not know what is going on; "they will be running tests on me, but I do not know what is going on. My husband just found me on the couch". Nurse assistant came in to take blood sugar levels during the visit. Pt asked prayer for family. Husband is taking care of her mother, and sons are far away, in [place], and in [place]. [place] prayed with Pt, informed Pt about chaplain availability, and left.    04/12/19 1124  Clinical Encounter Type  Visited With Patient  Visit Type Initial;Psychological support;Spiritual support  Referral From Chaplain  Consult/Referral To Chaplain  Spiritual Encounters  Spiritual Needs Prayer;Emotional  Stress Factors  Patient Stress Factors Health changes;Lack of knowledge;Loss of control;Major life changes

## 2019-04-12 NOTE — Progress Notes (Addendum)
Complicated phases or questions patient has delayed responses. Dr. Patsey Berthold aware.

## 2019-04-13 ENCOUNTER — Encounter: Payer: Self-pay | Admitting: Internal Medicine

## 2019-04-13 ENCOUNTER — Other Ambulatory Visit: Payer: Self-pay

## 2019-04-13 LAB — CULTURE, RESPIRATORY W GRAM STAIN: Culture: NORMAL

## 2019-04-13 LAB — BASIC METABOLIC PANEL
Anion gap: 8 (ref 5–15)
BUN: 19 mg/dL (ref 8–23)
CO2: 26 mmol/L (ref 22–32)
Calcium: 8.4 mg/dL — ABNORMAL LOW (ref 8.9–10.3)
Chloride: 104 mmol/L (ref 98–111)
Creatinine, Ser: 0.77 mg/dL (ref 0.44–1.00)
GFR calc Af Amer: >60 mL/min (ref >60)
GFR calc non Af Amer: >60 mL/min (ref >60)
Glucose, Bld: 160 mg/dL — ABNORMAL HIGH (ref 70–99)
Potassium: 3.8 mmol/L (ref 3.5–5.1)
Sodium: 138 mmol/L (ref 135–145)

## 2019-04-13 LAB — CBC WITH DIFFERENTIAL/PLATELET
Abs Immature Granulocytes: 0.06 10*3/uL (ref 0.00–0.07)
Basophils Absolute: 0 10*3/uL (ref 0.0–0.1)
Basophils Relative: 0 %
Eosinophils Absolute: 0.1 10*3/uL (ref 0.0–0.5)
Eosinophils Relative: 1 %
HCT: 31 % — ABNORMAL LOW (ref 36.0–46.0)
Hemoglobin: 9.5 g/dL — ABNORMAL LOW (ref 12.0–15.0)
Immature Granulocytes: 1 %
Lymphocytes Relative: 20 %
Lymphs Abs: 2 10*3/uL (ref 0.7–4.0)
MCH: 22.9 pg — ABNORMAL LOW (ref 26.0–34.0)
MCHC: 30.6 g/dL (ref 30.0–36.0)
MCV: 74.7 fL — ABNORMAL LOW (ref 80.0–100.0)
Monocytes Absolute: 1 10*3/uL (ref 0.1–1.0)
Monocytes Relative: 10 %
Neutro Abs: 7.2 10*3/uL (ref 1.7–7.7)
Neutrophils Relative %: 68 %
Platelets: 371 10*3/uL (ref 150–400)
RBC: 4.15 MIL/uL (ref 3.87–5.11)
RDW: 17.1 % — ABNORMAL HIGH (ref 11.5–15.5)
WBC: 10.4 10*3/uL (ref 4.0–10.5)
nRBC: 0 % (ref 0.0–0.2)

## 2019-04-13 LAB — PHOSPHORUS: Phosphorus: 3.1 mg/dL (ref 2.5–4.6)

## 2019-04-13 LAB — GLUCOSE, CAPILLARY
Glucose-Capillary: 132 mg/dL — ABNORMAL HIGH (ref 70–99)
Glucose-Capillary: 172 mg/dL — ABNORMAL HIGH (ref 70–99)
Glucose-Capillary: 185 mg/dL — ABNORMAL HIGH (ref 70–99)
Glucose-Capillary: 230 mg/dL — ABNORMAL HIGH (ref 70–99)
Glucose-Capillary: 280 mg/dL — ABNORMAL HIGH (ref 70–99)

## 2019-04-13 LAB — MAGNESIUM: Magnesium: 2 mg/dL (ref 1.7–2.4)

## 2019-04-13 MED ORDER — LISINOPRIL 20 MG PO TABS
40.0000 mg | ORAL_TABLET | Freq: Every day | ORAL | Status: DC
  Administered 2019-04-13 – 2019-04-14 (×2): 40 mg via ORAL
  Filled 2019-04-13 (×2): qty 2

## 2019-04-13 MED ORDER — AMLODIPINE BESYLATE 10 MG PO TABS
10.0000 mg | ORAL_TABLET | Freq: Every day | ORAL | Status: DC
  Administered 2019-04-13 – 2019-04-14 (×2): 10 mg via ORAL
  Filled 2019-04-13 (×2): qty 1

## 2019-04-13 MED ORDER — DULOXETINE HCL 60 MG PO CPEP
60.0000 mg | ORAL_CAPSULE | Freq: Every day | ORAL | Status: DC
  Administered 2019-04-13 – 2019-04-14 (×2): 60 mg via ORAL
  Filled 2019-04-13 (×3): qty 1

## 2019-04-13 MED ORDER — FUROSEMIDE 20 MG PO TABS
20.0000 mg | ORAL_TABLET | Freq: Every day | ORAL | Status: DC
  Administered 2019-04-13 – 2019-04-14 (×2): 20 mg via ORAL
  Filled 2019-04-13 (×2): qty 1

## 2019-04-13 MED ORDER — AMLODIPINE BESYLATE 5 MG PO TABS
5.0000 mg | ORAL_TABLET | Freq: Every day | ORAL | Status: DC
  Filled 2019-04-13: qty 1

## 2019-04-13 MED ORDER — HYDRALAZINE HCL 20 MG/ML IJ SOLN
5.0000 mg | Freq: Four times a day (QID) | INTRAMUSCULAR | Status: DC | PRN
  Filled 2019-04-13: qty 0.25

## 2019-04-13 MED ORDER — CLOPIDOGREL BISULFATE 75 MG PO TABS
75.0000 mg | ORAL_TABLET | Freq: Every day | ORAL | Status: DC
  Administered 2019-04-13 – 2019-04-14 (×2): 75 mg via ORAL
  Filled 2019-04-13 (×2): qty 1

## 2019-04-13 MED ORDER — CARVEDILOL 25 MG PO TABS
25.0000 mg | ORAL_TABLET | Freq: Two times a day (BID) | ORAL | Status: DC
  Administered 2019-04-13 – 2019-04-14 (×2): 25 mg via ORAL
  Filled 2019-04-13 (×2): qty 1

## 2019-04-13 NOTE — Evaluation (Signed)
Physical Therapy Evaluation Patient Details Name: Katrina Ortiz MRN: [id number] DOB: [birth year] Today's Date: 04/13/2019   History of Present Illness  Patient is a 64 year old female admitted from home with hypoglycemia and respiratory failure. PMH includes: psoriasis, DM, UTI, obesity, CAD, CABG.  Clinical Impression  Patient received in bed, agrees to PT assessment. Patient reports she does not recall what happened prior to her arrival. She lives with husband and mother. Neither of whom can help her. Patient performed bed mobility mod independently using rails and with increased effort. She performed sit to stand with min guard and ambulated 20 feet in room with min guard. Decreased balance and cadence. Patient will benefit from continued skilled PT while here to improve strength and functional independence for safe return home.     Follow Up Recommendations Home health PT    Equipment Recommendations  None recommended by PT    Recommendations for Other Services       Precautions / Restrictions Precautions Precautions: Fall Restrictions Weight Bearing Restrictions: No      Mobility  Bed Mobility Overal bed mobility: Modified Independent             General bed mobility comments: increased effort, use of rails  Transfers Overall transfer level: Needs assistance Equipment used: Rolling walker (2 wheeled) Transfers: Sit to/from Stand Sit to Stand: Min guard            Ambulation/Gait Ambulation/Gait assistance: Min guard Gait Distance (Feet): 20 Feet Assistive device: Rolling walker (2 wheeled) Gait Pattern/deviations: Narrow base of support;Decreased step length - right;Decreased step length - left Gait velocity: decreased   General Gait Details: patient weak with ambulation.  Stairs            Wheelchair Mobility    Modified Rankin (Stroke Patients Only)       Balance Overall balance assessment: Mild deficits observed, not formally tested                                            Pertinent Vitals/Pain Pain Assessment: No/denies pain    Home Living Family/patient expects to be discharged to:: Private residence Living Arrangements: Spouse/significant other;Parent   Type of Home: House Home Access: Stairs to enter Entrance Stairs-Rails: Psychiatric nurse of Steps: 3 Home Layout: One level Home Equipment: Environmental consultant - 2 wheels;Cane - single point;Shower seat - built in      Prior Function Level of Independence: Independent               Journalist, newspaper        Extremity/Trunk Assessment   Upper Extremity Assessment Upper Extremity Assessment: Generalized weakness    Lower Extremity Assessment Lower Extremity Assessment: Generalized weakness    Cervical / Trunk Assessment Cervical / Trunk Assessment: Normal  Communication   Communication: No difficulties  Cognition Arousal/Alertness: Awake/alert Behavior During Therapy: WFL for tasks assessed/performed Overall Cognitive Status: Within Functional Limits for tasks assessed                                        General Comments      Exercises     Assessment/Plan    PT Assessment Patient needs continued PT services  PT Problem List Decreased strength;Decreased activity tolerance;Decreased balance;Decreased safety awareness;Decreased mobility  PT Treatment Interventions Therapeutic exercise;Gait training;DME instruction;Functional mobility training;Therapeutic activities;Patient/family education;Balance training;Stair training    PT Goals (Current goals can be found in the Care Plan section)  Acute Rehab PT Goals Patient Stated Goal: to return home PT Goal Formulation: With patient Time For Goal Achievement: 04/20/19 Potential to Achieve Goals: Good    Frequency Min 2X/week   Barriers to discharge Decreased caregiver support      Co-evaluation               AM-PAC PT "6  Clicks" Mobility  Outcome Measure Help needed turning from your back to your side while in a flat bed without using bedrails?: None Help needed moving from lying on your back to sitting on the side of a flat bed without using bedrails?: None Help needed moving to and from a bed to a chair (including a wheelchair)?: A Little Help needed standing up from a chair using your arms (e.g., wheelchair or bedside chair)?: A Little Help needed to walk in hospital room?: A Little Help needed climbing 3-5 steps with a railing? : A Lot 6 Click Score: 19    End of Session Equipment Utilized During Treatment: Gait belt Activity Tolerance: Patient tolerated treatment well Patient left: in bed;with call bell/phone within reach Nurse Communication: Mobility status PT Visit Diagnosis: Muscle weakness (generalized) (M62.81);Difficulty in walking, not elsewhere classified (R26.2);History of falling (Z91.81)    Time: [id number] PT Time Calculation (min) (ACUTE ONLY): 17 min   Charges:   PT Evaluation $PT Eval Moderate Complexity: 1 Mod PT Treatments $Gait Training: 8-22 mins        Cayetano Mikita, PT, GCS 04/13/19,3:06 PM

## 2019-04-13 NOTE — Progress Notes (Addendum)
Progress Note    Katrina Ortiz  [id number] DOB: 1955/08/20  DOA: 04/10/2019 PCP: Lowella Bandy, MD      Brief Narrative:    Medical records reviewed and are as summarized below:  Katrina Ortiz is an 64 y.o. female with a PMH of Psoriasis, Type II Diabetes Mellitus, Hypoglycemia, HTN, Complicated UTI (Klebsiella), Bilateral Lower Extremity Cellulitis, Obesity, CAD (drug eluting to RCA 2004), and CABG (2003).  She presented to [facility] ER on 02/17 with altered mental status.  Per ER notes family reported the pt was unresponsive for several hours during the day pm 02/17, therefore EMS notified. Upon EMS arrival at pts home pt hypoglycemic with CBG of 37.  She received D10W with CBG initially increasing to 148, however within 5 minutes CBG dropped to 70. EMS reported the pts living conditions were poor with feces on the walls.       Assessment/Plan:   Active Problems:   Acute respiratory failure (HCC)   Acute hypoxemic and hypercapnic respiratory failure: Resolved.  S/p mechanical ventilation.  Self extubated on 04/11/2019.  She is tolerating room air.  Acute diastolic CHF/pulmonary edema: Improved.  Status post IV Lasix on 04/11/2019.  2D echo showed EF estimated at 60 to [id number], grade 1 diastolic dysfunction.  Start low-dose oral Lasix.  Type 2 diabetes mellitus with recent hypoglycemia: Hemoglobin A1c is 9.9.  Metformin on hold.  Continue Lantus and NovoLog as needed.  Monitor glucose levels closely..  Mild AKI: Resolved  CAD status post coronary stent: Continue Plavix and statins  Hypertension: BP is uncontrolled.  Resume home antihypertensives (amlodipine, lisinopril and carvedilol)  Acute metabolic encephalopathy: Resolved  Debility: PT evaluation  Poor home situation?: Follow-up with Education officer, museum.   Body mass index is 31.22 kg/m.  (Obesity)   Family Communication/Anticipated D/C date and plan/Code Status   DVT prophylaxis: Lovenox Code Status: Full  code Family Communication: Plan discussed with the patient Disposition Plan: Patient is from home.  Possible discharge to home tomorrow and glucose levels stable and blood pressure is controlled.      Subjective:   No complaints.  No cough, shortness of breath or chest pain.  She feels better today.  Objective:    Vitals:   04/13/19 0218 04/13/19 0500 04/13/19 0742 04/13/19 0850  BP: (!) 166/75   (!) 170/64  Pulse: 88   92  Resp:    18  Temp:    98.1 F (36.7 C)  TempSrc:    Oral  SpO2:   98% 100%  Weight:  87.7 kg    Height:        Intake/Output Summary (Last 24 hours) at 04/13/2019 1148 Last data filed at 04/13/2019 [id number] Gross per 24 hour  Intake 860 ml  Output 1800 ml  Net -940 ml   Filed Weights   04/10/19 2148 04/13/19 0500  Weight: 96.2 kg 87.7 kg    Exam:  GEN: NAD SKIN: Hypopigmented areas, erosions/scars on back, abdomen and legs (psoriatic lesions) EYES: EOMI ENT: MMM CV: RRR PULM: CTA B ABD: soft, obese, NT, +BS CNS: AAO x 3, non focal EXT: No edema or tenderness   Data Reviewed:   I have personally reviewed following labs and imaging studies:  Labs: Labs show the following:   Basic Metabolic Panel: Recent Labs  Lab 04/10/19 2142 04/10/19 2142 04/11/19 0223 04/11/19 0223 04/12/19 0513 04/13/19 0549  NA 134*  --  135  --  141 138  K 5.0   < >  4.3   < > 4.5 3.8  CL 100  --  102  --  104 104  CO2 25  --  24  --  25 26  GLUCOSE 261*  --  224*  --  250* 160*  BUN 21  --  21  --  18 19  CREATININE 1.03*  --  0.93  --  0.91 0.77  CALCIUM 8.7*  --  8.7*  --  8.5* 8.4*  MG  --   --  1.6*  --  2.1 2.0  PHOS  --   --  3.8  --  5.7* 3.1   < > = values in this interval not displayed.   GFR Estimated Creatinine Clearance: 79.3 mL/min (by C-G formula based on SCr of 0.77 mg/dL). Liver Function Tests: Recent Labs  Lab 04/10/19 2142  AST 10*  ALT 9  ALKPHOS 104  BILITOT 0.6  PROT 7.0  ALBUMIN 2.7*   No results for input(s): LIPASE,  AMYLASE in the last 168 hours. No results for input(s): AMMONIA in the last 168 hours. Coagulation profile No results for input(s): INR, PROTIME in the last 168 hours.  CBC: Recent Labs  Lab 04/10/19 2142 04/11/19 0223 04/12/19 0513 04/13/19 0549  WBC 9.7 8.2 10.8* 10.4  NEUTROABS 8.1*  --  10.0* 7.2  HGB 10.2* 10.2* 9.6* 9.5*  HCT 34.3* 33.7* 31.6* 31.0*  MCV 77.1* 75.1* 75.2* 74.7*  PLT 367 335 347 371   Cardiac Enzymes: No results for input(s): CKTOTAL, CKMB, CKMBINDEX, TROPONINI in the last 168 hours. BNP (last 3 results) No results for input(s): PROBNP in the last 8760 hours. CBG: Recent Labs  Lab 04/12/19 1609 04/12/19 2112 04/13/19 0003 04/13/19 0749 04/13/19 1139  GLUCAP 211* 407* 280* 132* 230*   D-Dimer: No results for input(s): DDIMER in the last 72 hours. Hgb A1c: Recent Labs    04/11/19 0223  HGBA1C 9.9*   Lipid Profile: No results for input(s): CHOL, HDL, LDLCALC, TRIG, CHOLHDL, LDLDIRECT in the last 72 hours. Thyroid function studies: Recent Labs    04/12/19 0513  TSH 0.263*   Anemia work up: No results for input(s): VITAMINB12, FOLATE, FERRITIN, TIBC, IRON, RETICCTPCT in the last 72 hours. Sepsis Labs: Recent Labs  Lab 04/10/19 2142 04/11/19 0223 04/12/19 0513 04/13/19 0549  PROCALCITON  --  <0.10  --   --   WBC 9.7 8.2 10.8* 10.4  LATICACIDVEN 0.6  --   --   --     Microbiology Recent Results (from the past 240 hour(s))  Urine Culture     Status: None   Collection Time: 04/10/19  9:44 PM   Specimen: Urine, Clean Catch  Result Value Ref Range Status   Specimen Description   Final    URINE, CLEAN CATCH Performed at [facility], [address] [place], [place] [address]    Special Requests   Final    NONE Performed at [facility], [address], [place], [place] [address]    Culture   Final    NO GROWTH Performed at [facility], [place] [address] [place], [place] [address]    Report Status  04/11/2019 FINAL  Final  Blood culture (routine x 2)     Status: None (Preliminary result)   Collection Time: 04/10/19  9:48 PM   Specimen: BLOOD RIGHT FOREARM  Result Value Ref Range Status   Specimen Description BLOOD RIGHT FOREARM  Final   Special Requests Blood Culture adequate volume  Final   Culture   Final    NO GROWTH 3 DAYS Performed at [facility], [address] [place], [place] [address]    Report Status PENDING  Incomplete  Blood culture (routine x 2)     Status: None (Preliminary result)   Collection Time: 04/10/19  9:48 PM   Specimen: Left Antecubital; Blood  Result Value Ref Range Status   Specimen Description LEFT ANTECUBITAL  Final   Special Requests Blood Culture adequate volume  Final   Culture   Final    NO GROWTH 3 DAYS Performed at [facility], [address] [place], [place] [address]    Report Status PENDING  Incomplete  Respiratory Panel by RT PCR (Flu A&B, Covid) - Nasopharyngeal Swab     Status: None   Collection Time: 04/10/19 11:29 PM   Specimen: Nasopharyngeal Swab  Result Value Ref Range Status   SARS Coronavirus 2 by RT PCR NEGATIVE NEGATIVE Final    Comment: (NOTE) SARS-CoV-2 target nucleic acids are NOT DETECTED. The SARS-CoV-2 RNA is generally detectable in upper respiratoy specimens during the acute phase of infection. The lowest concentration of SARS-CoV-2 viral copies this assay can detect is 131 copies/mL. A negative result does not preclude SARS-Cov-2 infection and should not be used as the sole basis for treatment or other patient management decisions. A negative result may occur with  improper specimen collection/handling, submission of specimen other than nasopharyngeal swab, presence of viral mutation(s) within the areas targeted by this assay, and inadequate number of viral copies (<131 copies/mL). A negative result must be combined with clinical observations, patient history, and epidemiological  information. The expected result is Negative. Fact Sheet for Patients:  PinkCheek.be Fact Sheet for Healthcare Providers:  GravelBags.it This test is not yet ap proved or cleared by the [place] FDA and  has been authorized for detection and/or diagnosis of SARS-CoV-2 by FDA under an Emergency Use Authorization (EUA). This EUA will remain  in effect (meaning this test can be used) for the duration of the COVID-19 declaration under Section 564(b)(1) of the Act, 21 U.S.C. section 360bbb-3(b)(1), unless the authorization is terminated or revoked sooner.    Influenza A by PCR NEGATIVE NEGATIVE Final   Influenza B by PCR NEGATIVE NEGATIVE Final    Comment: (NOTE) The Xpert Xpress SARS-CoV-2/FLU/RSV assay is intended as an aid in  the diagnosis of influenza from Nasopharyngeal swab specimens and  should not be used as a sole basis for treatment. Nasal washings and  aspirates are unacceptable for Xpert Xpress SARS-CoV-2/FLU/RSV  testing. Fact Sheet for Patients: PinkCheek.be Fact Sheet for Healthcare Providers: GravelBags.it This test is not yet approved or cleared by the [place] FDA and  has been authorized for detection and/or diagnosis of SARS-CoV-2 by  FDA under an Emergency Use Authorization (EUA). This EUA will remain  in effect (meaning this test can be used) for the duration of the  Covid-19 declaration under Section 564(b)(1) of the Act, 21  U.S.C. section 360bbb-3(b)(1), unless the authorization is  terminated or revoked. Performed at [name], [address] [place], [place] [address]   MRSA PCR Screening     Status: None   Collection Time: 04/11/19  2:13 AM   Specimen: Nasal Mucosa; Nasopharyngeal  Result Value Ref Range Status   MRSA by PCR NEGATIVE NEGATIVE Final    Comment:        The GeneXpert MRSA Assay (FDA approved for NASAL  specimens only), is one  component of a comprehensive MRSA colonization surveillance program. It is not intended to diagnose MRSA infection nor to guide or monitor treatment for MRSA infections. Performed at [facility], [address] [place], [place] [address]   Culture, respiratory     Status: None   Collection Time: 04/11/19 11:44 AM   Specimen: Tracheal Aspirate  Result Value Ref Range Status   Specimen Description   Final    TRACHEAL ASPIRATE Performed at [facility], [address] [place], [place] [address]    Special Requests   Final    NONE Performed at [facility], [place], [place] [address]    Gram Stain   Final    ABUNDANT WBC PRESENT,BOTH PMN AND MONONUCLEAR ABUNDANT GRAM POSITIVE COCCI FEW GRAM NEGATIVE RODS RARE GRAM VARIABLE ROD    Culture   Final    Consistent with normal respiratory flora. Performed at [facility], [place] [address], [place], [place] [address]    Report Status 04/13/2019 FINAL  Final    Procedures and diagnostic studies:  No results found.  Medications:   . amLODipine  10 mg Oral Daily  . azithromycin  500 mg Oral Daily  . budesonide (PULMICORT) nebulizer solution  0.5 mg Nebulization BID  . carvedilol  25 mg Oral BID WC  . Chlorhexidine Gluconate Cloth  6 each Topical Daily  . clopidogrel  75 mg Oral Daily  . DULoxetine  60 mg Oral Daily  . enoxaparin (LOVENOX) injection  40 mg Subcutaneous Q24H  . famotidine  20 mg Oral QHS  . furosemide  20 mg Oral Daily  . insulin aspart  0-5 Units Subcutaneous QHS  . insulin aspart  0-9 Units Subcutaneous TID WC  . insulin glargine  10 Units Subcutaneous Daily  . ipratropium-albuterol  3 mL Nebulization TID  . lisinopril  40 mg Oral Daily   Continuous Infusions:   LOS: 3 days   Jackie Littlejohn  [facility]     04/13/2019, 11:48 AM

## 2019-04-14 DIAGNOSIS — E162 Hypoglycemia, unspecified: Secondary | ICD-10-CM | POA: Diagnosis present

## 2019-04-14 DIAGNOSIS — E1122 Type 2 diabetes mellitus with diabetic chronic kidney disease: Secondary | ICD-10-CM

## 2019-04-14 DIAGNOSIS — E119 Type 2 diabetes mellitus without complications: Secondary | ICD-10-CM

## 2019-04-14 DIAGNOSIS — I5031 Acute diastolic (congestive) heart failure: Secondary | ICD-10-CM

## 2019-04-14 LAB — GLUCOSE, CAPILLARY
Glucose-Capillary: 164 mg/dL — ABNORMAL HIGH (ref 70–99)
Glucose-Capillary: 144 mg/dL — ABNORMAL HIGH (ref 70–99)

## 2019-04-14 MED ORDER — FUROSEMIDE 20 MG PO TABS: 20.0000 mg | ORAL_TABLET | Freq: Every day | ORAL | 0 refills | Status: DC

## 2019-04-14 NOTE — Discharge Instructions (Signed)
Heart Failure, Diagnosis  Heart failure is a condition in which the heart has trouble pumping blood because it has become weak or stiff. This means that the heart does not pump blood well enough for the body to stay healthy. For some people with heart failure, fluid may back up into the lungs. There may also be swelling (edema) in the lower legs. Heart failure is usually a long-term (chronic) condition. It is important for you to take good care of yourself and follow the treatment plan from your health care provider. What are the causes? This condition may be caused by:  High blood pressure (hypertension). Hypertension causes the heart muscle to work harder than normal. This makes the heart stiff or weak.  Coronary artery disease, or CAD. CAD is the buildup of cholesterol and fat (plaque) in the arteries of the heart.  Heart attack, also called myocardial infarction. This injures the heart muscle, making it hard for the heart to pump blood.  Abnormal heart valves. The valves do not open and close properly, forcing the heart to pump harder to keep the blood flowing.  Heart muscle disease (cardiomyopathy or myocarditis). This is damage to the heart muscle. It can increase the risk of heart failure.  Lung disease. The heart works harder when the lungs are not healthy.  Abnormal heart rhythms. These can lead to heart failure. What increases the risk? The risk of heart failure increases as a person ages. This condition is also more likely to develop in people who:  Are overweight.  Are female.  Smoke or chew tobacco.  Abuse alcohol or illegal drugs.  Have taken medicines that can damage the heart, such as chemotherapy drugs.  Have diabetes.  Have abnormal heart rhythms.  Have thyroid problems.  Have low blood counts (anemia). What are the signs or symptoms? Symptoms of this condition include:  Shortness of breath with activity, such as when climbing stairs.  A cough that does not  go away.  Swelling of the feet, ankles, legs, or abdomen.  Losing weight for no reason.  Trouble breathing when lying flat (orthopnea).  Waking from sleep because of the need to sit up and get more air.  Rapid heartbeat.  Tiredness (fatigue) and loss of energy.  Feeling light-headed, dizzy, or close to fainting.  Loss of appetite.  Nausea.  Waking up more often during the night to urinate (nocturia).  Confusion. How is this diagnosed? This condition is diagnosed based on:  Your medical history, symptoms, and a physical exam.  Diagnostic tests, which may include: ? Echocardiogram. ? Electrocardiogram (ECG). ? Chest X-ray. ? Blood tests. ? Exercise stress test. ? Radionuclide scans. ? Cardiac catheterization and angiogram. How is this treated? Treatment for this condition is aimed at managing the symptoms of heart failure. Medicines Treatment may include medicines that:  Help lower blood pressure by relaxing (dilating) the blood vessels. These medicines are called ACE inhibitors (angiotensin-converting enzyme) and ARBs (angiotensin receptor blockers).  Cause the kidneys to remove salt and water from the blood through urination (diuretics).  Improve heart muscle strength and prevent the heart from beating too fast (beta blockers).  Increase the force of the heartbeat (digoxin). Healthy behavior changes     Treatment may also include making healthy lifestyle changes, such as:  Reaching and staying at a healthy weight.  Quitting smoking or chewing tobacco.  Eating heart-healthy foods.  Limiting or avoiding alcohol.  Stopping the use of illegal drugs.  Being physically active.  Other treatments   Other treatments may include:  Procedures to open blocked arteries or repair damaged valves.  Placing a pacemaker to improve heart function (cardiac resynchronization therapy).  Placing a device to treat serious abnormal heart rhythms (implantable cardioverter  defibrillator, or ICD).  Placing a device to improve the pumping ability of the heart (left ventricular assist device, or LVAD).  Receiving a healthy heart from a donor (heart transplant). This is done when other treatments have not helped. Follow these instructions at home:  Manage other health conditions as told by your health care provider. These may include hypertension, diabetes, thyroid disease, or abnormal heart rhythms.  Get ongoing education and support as needed. Learn as much as you can about heart failure.  Keep all follow-up visits as told by your health care provider. This is important. Summary  Heart failure is a condition in which the heart has trouble pumping blood because it has become weak or stiff.  This condition is caused by high blood pressure and other diseases of the heart and lungs.  Symptoms of this condition include shortness of breath, tiredness (fatigue), nausea, and swelling of the feet, ankles, legs, or abdomen.  Treatments for this condition may include medicines, lifestyle changes, and surgery.  Manage other health conditions as told by your health care provider. This information is not intended to replace advice given to you by your health care provider. Make sure you discuss any questions you have with your health care provider. Document Revised: 04/27/2018 Document Reviewed: 04/27/2018 Elsevier Patient Education  2020 [employer]  

## 2019-04-14 NOTE — Discharge Summary (Signed)
Physician Discharge Summary  Jnai Fauver [id number] DOB: April 10, 1955 DOA: 04/10/2019  PCP: Lowella Bandy, MD  Admit date: 04/10/2019 Discharge date: 04/14/2019  Discharge disposition: Home   Recommendations for Outpatient Follow-Up:   Follow-up with PCP in 1 week Follow-up at the heart failure clinic as scheduled   Discharge Diagnosis:   Active Problems:   Acute respiratory failure (HCC)   Type 2 diabetes mellitus (HCC)   Acute diastolic CHF (congestive heart failure) ([place])   Hypoglycemia    Discharge Condition: Stable.  Diet recommendation: Low-salt, low sugar diet  Code status: Full code.    Hospital Course:   [name] is an 64 y.o. female with a PMH of Psoriasis, Type II Diabetes Mellitus, Hypoglycemia, HTN, Complicated UTI (Klebsiella), Bilateral Lower Extremity Cellulitis, Obesity, CAD (drug eluting to RCA 2004), and CABG (2003). She presented to [facility] ER on 02/17 with altered mental status. Per ER notes family reported the pt was unresponsive for several hours during the day pm 02/17, therefore EMS notified. Upon EMS arrival at pts home pt hypoglycemic with CBG of 37. She received D10W with CBG initially increasing to 148, however within 5 minutes CBG dropped to 70. EMS reported the pts living conditions were poor with feces on the walls.   She was intubated and placed on mechanical ventilation for acute respiratory failure.  She was also treated with IV Lasix for acute diastolic heart failure/pulmonary edema.  The patient self extubated but she was able to tolerate room air.  2D echo showed grade 1 diastolic dysfunction and EF estimated at 60-65%.  Hypoglycemia was also treated.  Hemoglobin A1c was 9.9.  Her glucose levels have been stable on 10 units of Lantus which is the same dose that she takes at home.  She thinks that she did not eat as a result of oversleeping at home and at might have like to symptomatic hypoglycemia.  She said that most of  the time she knows when her glucose level is low.  She has decided that she would set an alarm so that she does not oversleep and to remind her to eat.  It was noted in the chart that patient has poor home situation.  However, patient said that her home situation is okay.  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  She was evaluated by PT who recommended home PT for unsteady gait/balance issues.  Her condition has improved and she is deemed stable for discharge to home today.  Close follow-up with PCP was strongly recommended.        Discharge Exam:   Vitals:   04/14/19 0754 04/14/19 0755  BP: (!) 176/63   Pulse: 85   Resp: 18   Temp: 98.4 F (36.9 C)   SpO2: 91% 91%   Vitals:   04/13/19 2358 04/14/19 0516 04/14/19 0754 04/14/19 0755  BP: (!) 172/78  (!) 176/63   Pulse: 85  85   Resp: 18  18   Temp: 98.2 F (36.8 C)  98.4 F (36.9 C)   TempSrc: Oral  Oral   SpO2: 93%  91% 91%  Weight:  90.7 kg    Height:         GEN: NAD SKIN:Hypopigmented areas, erosions/scars on back, abdomen and legs (psoriatic lesions)  EYES: EOMI ENT: MMM CV: RRR PULM: CTA B ABD: soft, obese, NT, +BS CNS: AAO x 3, non focal EXT: No edema or tenderness   The  results of significant diagnostics from this hospitalization (including imaging, microbiology, ancillary and laboratory) are listed below for reference.     Procedures and Diagnostic Studies:   DG Chest Port 1 View  Result Date: 04/11/2019 CLINICAL DATA:  Acute respiratory failure EXAM: PORTABLE CHEST 1 VIEW COMPARISON:  Yesterday FINDINGS: Endotracheal tube tip is at the clavicular heads. The orogastric tube at least reaches the stomach. Cardiomegaly. CABG. Hazy opacity of the right more than left lower chest at least partially from pleural fluid on the right. No visible pneumothorax IMPRESSION: 1. Stable hardware positioning. 2. Hazy bilateral pulmonary opacity with layering pleural effusion on  the right at least. Electronically Signed   By: Monte Fantasia M.D.   On: 04/11/2019 04:24   ECHOCARDIOGRAM COMPLETE  Result Date: 04/11/2019    ECHOCARDIOGRAM REPORT   Patient Name:   Katrina Ortiz Date of Exam: 04/11/2019 Medical Rec #:  [id number]      Height:       66.0 in Accession #:    [id number]     Weight:       212.0 lb Date of Birth:  Jun 06, 1955     BSA:          2.05 m Patient Age:    64 years       BP:           166/67 mmHg Patient Gender: F              HR:           85 bpm. Exam Location:  [facility] Procedure: 2D Echo, Color Doppler and Cardiac Doppler Indications:     R06.89 Acute Respiratory Insufficiency  History:         Patient has no prior history of Echocardiogram examinations.                  Risk Factors:Hypertension and Diabetes.  Sonographer:     Charmayne Sheer RDCS (AE) Referring Phys:  [id number] Awilda Bill Diagnosing Phys: Ida Rogue MD  Sonographer Comments: Echo performed with patient supine and on artificial respirator. IMPRESSIONS  1. Left ventricular ejection fraction, by estimation, is 60 to 65%. The left ventricle has normal function. The left ventricle has no regional wall motion abnormalities. Left ventricular diastolic parameters are consistent with Grade I diastolic dysfunction (impaired relaxation).  2. Right ventricular systolic function is normal. The right ventricular size is normal. Tricuspid regurgitation signal is inadequate for assessing PA pressure. FINDINGS  Left Ventricle: Left ventricular ejection fraction, by estimation, is 60 to 65%. The left ventricle has normal function. The left ventricle has no regional wall motion abnormalities. The left ventricular internal cavity size was normal in size. There is  no left ventricular hypertrophy. Left ventricular diastolic parameters are consistent with Grade I diastolic dysfunction (impaired relaxation). Right Ventricle: The right ventricular size is normal. No increase in right ventricular wall thickness. Right  ventricular systolic function is normal. Tricuspid regurgitation signal is inadequate for assessing PA pressure. Left Atrium: Left atrial size was normal in size. Right Atrium: Right atrial size was normal in size. Pericardium: There is no evidence of pericardial effusion. Mitral Valve: The mitral valve is normal in structure and function. Normal mobility of the mitral valve leaflets. Trivial mitral valve regurgitation. No evidence of mitral valve stenosis. MV peak gradient, 7.1 mmHg. The mean mitral valve gradient is 3.0 mmHg. Tricuspid Valve: The tricuspid valve is normal in structure. Tricuspid valve regurgitation is not demonstrated. No evidence of tricuspid stenosis.  Aortic Valve: The aortic valve was not well visualized. Aortic valve regurgitation is not visualized. No aortic stenosis is present. Aortic valve mean gradient measures 4.0 mmHg. Aortic valve peak gradient measures 8.1 mmHg. Aortic valve area, by VTI measures 1.82 cm. Pulmonic Valve: The pulmonic valve was normal in structure. Pulmonic valve regurgitation is not visualized. No evidence of pulmonic stenosis. Aorta: The aortic root is normal in size and structure. Venous: The inferior vena cava is normal in size with greater than 50% respiratory variability, suggesting right atrial pressure of 3 mmHg. IAS/Shunts: No atrial level shunt detected by color flow Doppler.  LEFT VENTRICLE PLAX 2D LVIDd:         5.60 cm     Diastology LVIDs:         3.70 cm     LV e' lateral:   7.07 cm/s LV PW:         0.84 cm     LV E/e' lateral: 13.2 LV IVS:        0.76 cm     LV e' medial:    4.79 cm/s LVOT diam:     1.80 cm     LV E/e' medial:  19.4 LV SV:         47.33 ml LV SV Index:   44.36 LVOT Area:     2.54 cm  LV Volumes (MOD) LV vol d, MOD A2C: 80.2 ml LV vol d, MOD A4C: 97.8 ml LV vol s, MOD A2C: 33.6 ml LV vol s, MOD A4C: 39.7 ml LV SV MOD A2C:     46.6 ml LV SV MOD A4C:     97.8 ml LV SV MOD BP:      52.4 ml RIGHT VENTRICLE RV Basal diam:  3.11 cm LEFT  ATRIUM             Index       RIGHT ATRIUM           Index LA diam:        3.90 cm 1.90 cm/m  RA Area:     10.60 cm LA Vol (A2C):   39.4 ml 19.22 ml/m RA Volume:   22.70 ml  11.07 ml/m LA Vol (A4C):   43.1 ml 21.02 ml/m LA Biplane Vol: 43.9 ml 21.42 ml/m  AORTIC VALVE                   PULMONIC VALVE AV Area (Vmax):    1.79 cm    PV Vmax:       1.12 m/s AV Area (Vmean):   1.85 cm    PV Vmean:      77.900 cm/s AV Area (VTI):     1.82 cm    PV VTI:        0.247 m AV Vmax:           142.00 cm/s PV Peak grad:  5.0 mmHg AV Vmean:          95.400 cm/s PV Mean grad:  3.0 mmHg AV VTI:            0.260 m AV Peak Grad:      8.1 mmHg AV Mean Grad:      4.0 mmHg LVOT Vmax:         99.80 cm/s LVOT Vmean:        69.200 cm/s LVOT VTI:          0.186 m LVOT/AV VTI ratio: 0.72  AORTA Ao Root diam:  2.80 cm MITRAL VALVE MV Area (PHT): 4.68 cm     SHUNTS MV Peak grad:  7.1 mmHg     Systemic VTI:  0.19 m MV Mean grad:  3.0 mmHg     Systemic Diam: 1.80 cm MV Vmax:       1.33 m/s MV Vmean:      86.2 cm/s MV Decel Time: 162 msec MV E velocity: 93.10 cm/s MV A velocity: 120.00 cm/s MV E/A ratio:  0.78 Ida Rogue MD Electronically signed by Ida Rogue MD Signature Date/Time: 04/11/2019/11:26:59 AM    Final      Labs:   Basic Metabolic Panel: Recent Labs  Lab 04/10/19 2142 04/10/19 2142 04/11/19 0223 04/11/19 0223 04/12/19 0513 04/13/19 0549  NA 134*  --  135  --  141 138  K 5.0   < > 4.3   < > 4.5 3.8  CL 100  --  102  --  104 104  CO2 25  --  24  --  25 26  GLUCOSE 261*  --  224*  --  250* 160*  BUN 21  --  21  --  18 19  CREATININE 1.03*  --  0.93  --  0.91 0.77  CALCIUM 8.7*  --  8.7*  --  8.5* 8.4*  MG  --   --  1.6*  --  2.1 2.0  PHOS  --   --  3.8  --  5.7* 3.1   < > = values in this interval not displayed.   GFR Estimated Creatinine Clearance: 80.6 mL/min (by C-G formula based on SCr of 0.77 mg/dL). Liver Function Tests: Recent Labs  Lab 04/10/19 2142  AST 10*  ALT 9  ALKPHOS 104   BILITOT 0.6  PROT 7.0  ALBUMIN 2.7*   No results for input(s): LIPASE, AMYLASE in the last 168 hours. No results for input(s): AMMONIA in the last 168 hours. Coagulation profile No results for input(s): INR, PROTIME in the last 168 hours.  CBC: Recent Labs  Lab 04/10/19 2142 04/11/19 0223 04/12/19 0513 04/13/19 0549  WBC 9.7 8.2 10.8* 10.4  NEUTROABS 8.1*  --  10.0* 7.2  HGB 10.2* 10.2* 9.6* 9.5*  HCT 34.3* 33.7* 31.6* 31.0*  MCV 77.1* 75.1* 75.2* 74.7*  PLT 367 335 347 371   Cardiac Enzymes: No results for input(s): CKTOTAL, CKMB, CKMBINDEX, TROPONINI in the last 168 hours. BNP: Invalid input(s): POCBNP CBG: Recent Labs  Lab 04/13/19 0749 04/13/19 1139 04/13/19 1650 04/13/19 2117 04/14/19 0754  GLUCAP 132* 230* 172* 185* 164*   D-Dimer No results for input(s): DDIMER in the last 72 hours. Hgb A1c No results for input(s): HGBA1C in the last 72 hours. Lipid Profile No results for input(s): CHOL, HDL, LDLCALC, TRIG, CHOLHDL, LDLDIRECT in the last 72 hours. Thyroid function studies Recent Labs    04/12/19 0513  TSH 0.263*   Anemia work up No results for input(s): VITAMINB12, FOLATE, FERRITIN, TIBC, IRON, RETICCTPCT in the last 72 hours. Microbiology Recent Results (from the past 240 hour(s))  Urine Culture     Status: None   Collection Time: 04/10/19  9:44 PM   Specimen: Urine, Clean Catch  Result Value Ref Range Status   Specimen Description   Final    URINE, CLEAN CATCH Performed at [facility], [address], [place], [place] [address]    Special Requests   Final    NONE Performed at [facility], [address], [place], [place] [address]  Culture   Final    NO GROWTH Performed at [facility], [place] [address] [place], [place] [address]    Report Status 04/11/2019 FINAL  Final  Blood culture (routine x 2)     Status: None (Preliminary result)   Collection Time: 04/10/19  9:48 PM   Specimen: BLOOD RIGHT FOREARM   Result Value Ref Range Status   Specimen Description BLOOD RIGHT FOREARM  Final   Special Requests Blood Culture adequate volume  Final   Culture   Final    NO GROWTH 4 DAYS Performed at [facility], [address], [place], [place] [address]    Report Status PENDING  Incomplete  Blood culture (routine x 2)     Status: None (Preliminary result)   Collection Time: 04/10/19  9:48 PM   Specimen: Left Antecubital; Blood  Result Value Ref Range Status   Specimen Description LEFT ANTECUBITAL  Final   Special Requests Blood Culture adequate volume  Final   Culture   Final    NO GROWTH 4 DAYS Performed at [facility], [address], [place], [place] [address]    Report Status PENDING  Incomplete  Respiratory Panel by RT PCR (Flu A&B, Covid) - Nasopharyngeal Swab     Status: None   Collection Time: 04/10/19 11:29 PM   Specimen: Nasopharyngeal Swab  Result Value Ref Range Status   SARS Coronavirus 2 by RT PCR NEGATIVE NEGATIVE Final    Comment: (NOTE) SARS-CoV-2 target nucleic acids are NOT DETECTED. The SARS-CoV-2 RNA is generally detectable in upper respiratoy specimens during the acute phase of infection. The lowest concentration of SARS-CoV-2 viral copies this assay can detect is 131 copies/mL. A negative result does not preclude SARS-Cov-2 infection and should not be used as the sole basis for treatment or other patient management decisions. A negative result may occur with  improper specimen collection/handling, submission of specimen other than nasopharyngeal swab, presence of viral mutation(s) within the areas targeted by this assay, and inadequate number of viral copies (<131 copies/mL). A negative result must be combined with clinical observations, patient history, and epidemiological information. The expected result is Negative. Fact Sheet for Patients:  PinkCheek.be Fact Sheet for Healthcare Providers:   GravelBags.it This test is not yet ap proved or cleared by the [place] FDA and  has been authorized for detection and/or diagnosis of SARS-CoV-2 by FDA under an Emergency Use Authorization (EUA). This EUA will remain  in effect (meaning this test can be used) for the duration of the COVID-19 declaration under Section 564(b)(1) of the Act, 21 U.S.C. section 360bbb-3(b)(1), unless the authorization is terminated or revoked sooner.    Influenza A by PCR NEGATIVE NEGATIVE Final   Influenza B by PCR NEGATIVE NEGATIVE Final    Comment: (NOTE) The Xpert Xpress SARS-CoV-2/FLU/RSV assay is intended as an aid in  the diagnosis of influenza from Nasopharyngeal swab specimens and  should not be used as a sole basis for treatment. Nasal washings and  aspirates are unacceptable for Xpert Xpress SARS-CoV-2/FLU/RSV  testing. Fact Sheet for Patients: PinkCheek.be Fact Sheet for Healthcare Providers: GravelBags.it This test is not yet approved or cleared by the [place] FDA and  has been authorized for detection and/or diagnosis of SARS-CoV-2 by  FDA under an Emergency Use Authorization (EUA). This EUA will remain  in effect (meaning this test can be used) for the duration of the  Covid-19 declaration under Section 564(b)(1) of the Act, 21  U.S.C. section  360bbb-3(b)(1), unless the authorization is  terminated or revoked. Performed at [facility], [address] [place], [place] [address]   MRSA PCR Screening     Status: None   Collection Time: 04/11/19  2:13 AM   Specimen: Nasal Mucosa; Nasopharyngeal  Result Value Ref Range Status   MRSA by PCR NEGATIVE NEGATIVE Final    Comment:        The GeneXpert MRSA Assay (FDA approved for NASAL specimens only), is one component of a comprehensive MRSA colonization surveillance program. It is not intended to diagnose MRSA infection nor to  guide or monitor treatment for MRSA infections. Performed at [facility], [address] [place], [place] [address]   Culture, respiratory     Status: None   Collection Time: 04/11/19 11:44 AM   Specimen: Tracheal Aspirate  Result Value Ref Range Status   Specimen Description   Final    TRACHEAL ASPIRATE Performed at [facility], [address] [place], [place] [address]    Special Requests   Final    NONE Performed at [facility], [place], [place] [address]    Gram Stain   Final    ABUNDANT WBC PRESENT,BOTH PMN AND MONONUCLEAR ABUNDANT GRAM POSITIVE COCCI FEW GRAM NEGATIVE RODS RARE GRAM VARIABLE ROD    Culture   Final    Consistent with normal respiratory flora. Performed at [facility], [place] [address] [place], [place] [address]    Report Status 04/13/2019 FINAL  Final     Discharge Instructions:   Discharge Instructions    (HEART FAILURE PATIENTS) Call MD:  Anytime you have any of the following symptoms: 1) 3 pound weight gain in 24 hours or 5 pounds in 1 week 2) shortness of breath, with or without a dry hacking cough 3) swelling in the hands, feet or stomach 4) if you have to sleep on extra pillows at night in order to breathe.   Complete by: As directed    AMB referral to CHF clinic   Complete by: As directed    Diet - low sodium heart healthy   Complete by: As directed    Diet Carb Modified   Complete by: As directed    Increase activity slowly   Complete by: As directed      Allergies as of 04/14/2019   No Known Allergies     Medication List    TAKE these medications   amLODipine 5 MG tablet Commonly known as: NORVASC Take 10 mg by mouth daily at 6 (six) AM.   carvedilol 25 MG tablet Commonly known as: COREG Take 25 mg by mouth 2 (two) times daily.   clopidogrel 75 MG tablet Commonly known as: PLAVIX Take 75 mg by mouth daily.   dicyclomine 20 MG tablet Commonly known as: Bentyl Take 1  tablet (20 mg total) by mouth 3 (three) times daily as needed for spasms.   DULoxetine 60 MG capsule Commonly known as: CYMBALTA Take 60 mg by mouth daily.   furosemide 20 MG tablet Commonly known as: LASIX Take 1 tablet (20 mg total) by mouth daily. Start taking on: April 15, 2019   Lantus SoloStar 100 UNIT/ML Solostar Pen Generic drug: Insulin Glargine Inject 10 Units into the skin at bedtime.   lisinopril 40 MG tablet Commonly known as: ZESTRIL Take 40 mg by mouth daily.   metFORMIN 500 MG tablet Commonly known as: GLUCOPHAGE Take 1,000 mg by mouth 2 (two) times  daily with a meal.   ondansetron 4 MG disintegrating tablet Commonly known as: Zofran ODT Take 1 tablet (4 mg total) by mouth every 8 (eight) hours as needed for nausea or vomiting.   oxyCODONE-acetaminophen 5-325 MG tablet Commonly known as: Percocet Take 1 tablet by mouth every 6 (six) hours as needed for severe pain.   pravastatin 80 MG tablet Commonly known as: PRAVACHOL Take 80 mg by mouth daily.         Time coordinating discharge: 28 minutes  Signed:  Aras Albarran  [facility] 04/14/2019, 10:55 AM

## 2019-04-14 NOTE — TOC Initial Note (Signed)
Transition of Care [facility]) - Initial/Assessment Note    Patient Details  Name: Katrina Ortiz MRN: [id number] Date of Birth: [birth year]  [facility]) CM/SW Contact:    Boris Sharper, LCSW Phone Number:[phone] 04/14/2019, 12:47 PM  Clinical Narrative:                 CSW spoke to pt and she was agreeable to [facility] recommendation. Patient said she worked with an agency before but does not remember the name.CSW provided pt with medicare.gov list of [place] agencies  Pt does not have a preference of which [place] agency. Pt gave CSW permission to contact Husband. CSW spoke with Tillie Rung at [facility] and she stated that the Patient cannot be seen until Thursday. CSW will follow up with PT and doctor to make sure the timing it ok.   TOC will continue to follow for discharge planning needs.  Expected Discharge Plan: [place] Barriers to Discharge: No Barriers Identified   Patient Goals and CMS Choice Patient states their goals for this hospitalization and ongoing recovery are:: To regain strength CMS Medicare.gov Compare Post Acute Care list provided to:: Patient Choice offered to / list presented to : Patient  Expected Discharge Plan and Services Expected Discharge Plan: [place]       Living arrangements for the past 2 months: Single Family Home Expected Discharge Date: 04/14/19                         [facility] Arranged: PT HH Agency: [facility] Date [place]: 04/14/19 Time [place]: [id number] Representative spoke with at [place]: Tillie Rung  Prior Living Arrangements/Services Living arrangements for the past 2 months: [place] with:: Spouse Patient language and need for interpreter reviewed:: Yes Do you feel safe going back to the place where you live?: Yes        Care giver support system in place?: Yes (comment)   Criminal Activity/Legal Involvement Pertinent to Current Situation/Hospitalization: No  - Comment as needed  Activities of Daily Living Home Assistive Devices/Equipment: Gilford Rile (specify type) ADL Screening (condition at time of admission) Patient's cognitive ability adequate to safely complete daily activities?: Yes Is the patient deaf or have difficulty hearing?: No Does the patient have difficulty seeing, even when wearing glasses/contacts?: No Does the patient have difficulty concentrating, remembering, or making decisions?: No Patient able to express need for assistance with ADLs?: Yes Does the patient have difficulty dressing or bathing?: No Independently performs ADLs?: Yes (appropriate for developmental age) Does the patient have difficulty walking or climbing stairs?: No Weakness of Legs: None Weakness of Arms/Hands: None  Permission Sought/Granted Permission sought to share information with : Facility Art therapist granted to share information with : Yes, Verbal Permission Granted  Share Information with NAME: Gwenlyn Perking     Permission granted to share info w Relationship: Spouse  Permission granted to share info w Contact Information: [phone]  Emotional Assessment Appearance:: Appears stated age Attitude/Demeanor/Rapport: Engaged Affect (typically observed): Accepting, Calm Orientation: : Oriented to Self, Oriented to Place, Oriented to  Time, Oriented to Situation   Psych Involvement: No (comment)  Admission diagnosis:  Acute respiratory failure (HCC) [J96.00] Hypoglycemia [E16.2] Acute respiratory failure with hypoxia and hypercapnia ([place]) [J96.01, J96.02] Patient Active Problem List   Diagnosis Date Noted  . Type 2 diabetes mellitus ([place]) 04/14/2019  . Acute diastolic CHF (congestive heart failure) ([place]) 04/14/2019  .  Hypoglycemia 04/14/2019  . Acute respiratory failure ([place]) 04/10/2019   PCP:  Lowella Bandy, MD Pharmacy:   [id number] - [place], [place] [address] Phone:  [phone] Fax: [phone]     Social Determinants of Health (SDOH) Interventions    Readmission Risk Interventions No flowsheet data found.

## 2019-04-14 NOTE — TOC Progression Note (Signed)
Transition of Care [facility]) - Progression Note    Patient Details  Name: Katrina Ortiz MRN: [id number] Date of Birth: 06-06-1955  Transition [facility] Care [facility]) CM/SW Contact  Boris Sharper, LCSW Phone Number:[phone] 04/14/2019, 1:01 PM  Clinical Narrative:    Physical Therapist stated that it is ok for her to be seen on Thursday by [facility]  and still discharge today.    Expected Discharge Plan: [place] Barriers to Discharge: No Barriers Identified  Expected Discharge Plan and Services Expected Discharge Plan: [place] arrangements for the past 2 months: Single Family Home Expected Discharge Date: 04/14/19                         HH Arranged: PT [facility] Date [place]: 04/14/19 Time [place]: [id number] Representative spoke with at [place]: [place] ([place]) Interventions    Readmission Risk Interventions No flowsheet data found.

## 2019-04-14 NOTE — Progress Notes (Signed)
Discharge teaching to patient. Furosemide and follow up appointments were explained. Pt verbalized understanding. Tele and IV access removed without difficulty. Pt transported via w/c to front entrance for discharge home. No acute distress. Prior to leaving, pt was told that PT would not see her until Thursday. This was fine with her.

## 2019-04-14 NOTE — TOC Transition Note (Signed)
Transition of Care [facility]) - CM/SW Discharge Note   Patient Details  Name: Katrina Ortiz MRN: [id number] Date of Birth: October 28, 1955  Transition of [facility]) CM/SW Contact:  Boris Sharper, LCSW Phone [phone] 04/14/2019, 2:10 PM   Clinical Narrative:    Pt medically stable for discharge. Pt stated that her husband will be transporting her home. CSW spoke with Tillie Rung [phone]) at [facility] and she will begin HHPT services on Thursday.   Final next level of care: [place] Barriers to Discharge: No Barriers Identified   Patient Goals and CMS Choice Patient states their goals for this hospitalization and ongoing recovery are:: To gain strength CMS Medicare.gov Compare Post Acute Care list provided to:: Patient Choice offered to / list presented to : Patient  Discharge Placement                Patient to be transferred to facility by: Spouse Name of family member notified: Gwenlyn Perking Patient and family notified of of transfer: 04/14/19  Discharge Plan and Services                          HH Arranged: PT [facility] Agency: [facility] Date [place]: 04/14/19 Time [place]: [id number] Representative spoke with at [place]: [place] ([place]) Interventions     Readmission Risk Interventions No flowsheet data found.

## 2019-04-15 LAB — CULTURE, BLOOD (ROUTINE X 2)
Culture: NO GROWTH
Culture: NO GROWTH
Special Requests: ADEQUATE
Special Requests: ADEQUATE

## 2019-04-22 ENCOUNTER — Ambulatory Visit: Payer: BLUE CROSS/BLUE SHIELD | Admitting: Family

## 2020-01-04 ENCOUNTER — Other Ambulatory Visit: Payer: Self-pay

## 2020-01-04 ENCOUNTER — Encounter: Payer: Self-pay | Admitting: Emergency Medicine

## 2020-01-04 ENCOUNTER — Emergency Department: Payer: BLUE CROSS/BLUE SHIELD

## 2020-01-04 ENCOUNTER — Emergency Department
Admission: EM | Admit: 2020-01-04 | Discharge: 2020-01-04 | Disposition: A | Discharge status: Home / Self Care | Payer: BLUE CROSS/BLUE SHIELD | Attending: Emergency Medicine | Admitting: Emergency Medicine

## 2020-01-04 DIAGNOSIS — S34139A Unspecified injury to sacral spinal cord, initial encounter: Secondary | ICD-10-CM | POA: Diagnosis present

## 2020-01-04 DIAGNOSIS — W010XXA Fall on same level from slipping, tripping and stumbling without subsequent striking against object, initial encounter: Secondary | ICD-10-CM | POA: Insufficient documentation

## 2020-01-04 DIAGNOSIS — Z7984 Long term (current) use of oral hypoglycemic drugs: Secondary | ICD-10-CM | POA: Insufficient documentation

## 2020-01-04 DIAGNOSIS — W19XXXA Unspecified fall, initial encounter: Secondary | ICD-10-CM

## 2020-01-04 DIAGNOSIS — Z7901 Long term (current) use of anticoagulants: Secondary | ICD-10-CM | POA: Diagnosis not present

## 2020-01-04 DIAGNOSIS — Z951 Presence of aortocoronary bypass graft: Secondary | ICD-10-CM | POA: Insufficient documentation

## 2020-01-04 DIAGNOSIS — I110 Hypertensive heart disease with heart failure: Secondary | ICD-10-CM | POA: Insufficient documentation

## 2020-01-04 DIAGNOSIS — Z794 Long term (current) use of insulin: Secondary | ICD-10-CM | POA: Diagnosis not present

## 2020-01-04 DIAGNOSIS — I5031 Acute diastolic (congestive) heart failure: Secondary | ICD-10-CM | POA: Insufficient documentation

## 2020-01-04 DIAGNOSIS — Z79899 Other long term (current) drug therapy: Secondary | ICD-10-CM | POA: Insufficient documentation

## 2020-01-04 DIAGNOSIS — S32120A Nondisplaced Zone II fracture of sacrum, initial encounter for closed fracture: Secondary | ICD-10-CM | POA: Insufficient documentation

## 2020-01-04 DIAGNOSIS — E11649 Type 2 diabetes mellitus with hypoglycemia without coma: Secondary | ICD-10-CM | POA: Diagnosis not present

## 2020-01-04 DIAGNOSIS — Z87891 Personal history of nicotine dependence: Secondary | ICD-10-CM | POA: Insufficient documentation

## 2020-01-04 DIAGNOSIS — S3210XA Unspecified fracture of sacrum, initial encounter for closed fracture: Secondary | ICD-10-CM

## 2020-01-04 MED ORDER — OXYCODONE-ACETAMINOPHEN 5-325 MG PO TABS: 1.0000 | ORAL_TABLET | Freq: Four times a day (QID) | ORAL | 0 refills | Status: DC | PRN

## 2020-01-04 MED ORDER — OXYCODONE-ACETAMINOPHEN 5-325 MG PO TABS
1.0000 | ORAL_TABLET | Freq: Once | ORAL | Status: AC
  Administered 2020-01-04: 1 via ORAL
  Filled 2020-01-04: qty 1

## 2020-01-04 NOTE — ED Triage Notes (Signed)
See first RN Note, pt c/o mechanical fall, c/o sacral pain at this time.

## 2020-01-04 NOTE — ED Triage Notes (Signed)
FIRST NURSE: Pt tripped and fell while visiting at [facility].  Pt landed on tailbone, pt had trouble standing and is uncomfortable sitting.  PT given 9mcg of Fentanyl by EMS

## 2020-01-04 NOTE — ED Provider Notes (Signed)
[facility] Emergency Department Provider Note  ____________________________________________  Time seen: Approximately 8:58 PM  I have reviewed the triage vital signs and the nursing notes.   HISTORY  Chief Complaint Fall    HPI Katrina Ortiz is a 64 y.o. female who presents the emergency department complaining of of pain in her coccyx/sacral region.  Patient states that she was visiting [facility], slipped and fell landing directly on her buttocks.  Patient was brought to emergency department by EMS and administered 75 mcg of fentanyl.  Patient reports pain is in the coccyx region spreading to bilateral buttocks.  Patient denies any hip pain.  She denies any low back pain.  She did not hit her head or lose consciousness during the injury.  No history of previous fractures to the sacrum or coccyx.         Past Medical History:  Diagnosis Date  . Diabetes mellitus without complication ([place])   . Hypertension   . Psoriasis     Patient Active Problem List   Diagnosis Date Noted  . Type 2 diabetes mellitus ([employer]) 04/14/2019  . Acute diastolic CHF (congestive heart failure) ([place]) 04/14/2019  . Hypoglycemia 04/14/2019  . Acute respiratory failure ([place]) 04/10/2019    Past Surgical History:  Procedure Laterality Date  . CESAREAN SECTION     2 c-sections  . CORONARY ARTERY BYPASS GRAFT    . CORONARY STENT PLACEMENT    . HERNIA REPAIR    . TUBAL LIGATION      Prior to Admission medications   Medication Sig Start Date End Date Taking? Authorizing Provider  amLODipine (NORVASC) 5 MG tablet Take 10 mg by mouth daily at 6 (six) AM. 03/07/19 03/06/20  [provider]  carvedilol (COREG) 25 MG tablet Take 25 mg by mouth 2 (two) times daily. 03/07/19   [provider]  clopidogrel (PLAVIX) 75 MG tablet Take 75 mg by mouth daily. 03/28/19   [provider]  dicyclomine (BENTYL) 20 MG tablet Take 1 tablet (20 mg total) by mouth 3  (three) times daily as needed for spasms. 02/27/19   Carrie Mew, MD  DULoxetine (CYMBALTA) 60 MG capsule Take 60 mg by mouth daily. 03/28/19   [provider]  furosemide (LASIX) 20 MG tablet Take 1 tablet (20 mg total) by mouth daily. 04/15/19   Jennye Boroughs, MD  LANTUS SOLOSTAR 100 UNIT/ML Solostar Pen Inject 10 Units into the skin at bedtime. 03/07/19   [provider]  lisinopril (ZESTRIL) 40 MG tablet Take 40 mg by mouth daily. 03/28/19   [provider]  metFORMIN (GLUCOPHAGE) 500 MG tablet Take 1,000 mg by mouth 2 (two) times daily with a meal. 03/07/19   [provider]  ondansetron (ZOFRAN ODT) 4 MG disintegrating tablet Take 1 tablet (4 mg total) by mouth every 8 (eight) hours as needed for nausea or vomiting. 02/27/19   Carrie Mew, MD  oxyCODONE-acetaminophen (PERCOCET/ROXICET) 5-325 MG tablet Take 1 tablet by mouth every 6 (six) hours as needed for severe pain. 01/04/20   Kolbee Bogusz, Charline Bills, PA-C  pravastatin (PRAVACHOL) 80 MG tablet Take 80 mg by mouth daily. 03/07/19   [provider]    Allergies Patient has no known allergies.  History reviewed. No pertinent family history.  Social History Social History   Tobacco Use  . Smoking status: Former Smoker    Quit date: 11/22/2002    Years since quitting: 17.1  . Smokeless tobacco: Never Used  Substance Use Topics  .  Alcohol use: No  . Drug use: No     Review of Systems  Constitutional: No fever/chills Eyes: No visual changes. No discharge ENT: No upper respiratory complaints. Cardiovascular: no chest pain. Respiratory: no cough. No SOB. Gastrointestinal: No abdominal pain.  No nausea, no vomiting.  No diarrhea.  No constipation. Musculoskeletal: Sacrum/coccyx pain after a fall Skin: Negative for rash, abrasions, lacerations, ecchymosis. Neurological: Negative for headaches, focal weakness or numbness.  10 System ROS otherwise  negative.  ____________________________________________   PHYSICAL EXAM:  VITAL SIGNS: ED Triage Vitals  Enc Vitals Group     BP 01/04/20 1826 (!) 123/59     Pulse Rate 01/04/20 1826 70     Resp 01/04/20 1826 14     Temp 01/04/20 1826 97.8 F (36.6 C)     Temp Source 01/04/20 1826 Oral     SpO2 01/04/20 1826 95 %     Weight 01/04/20 1827 190 lb (86.2 kg)     Height 01/04/20 1827 5\' 3"  (1.6 m)     Head Circumference --      [place] --      Pain Score 01/04/20 1827 8     Pain Loc --      Pain Edu? --      Excl. in [place]? --      Constitutional: Alert and oriented. Well appearing and in no acute distress. Eyes: Conjunctivae are normal. PERRL. EOMI. Head: Atraumatic. ENT:      Ears:       Nose: No congestion/rhinnorhea.      Mouth/Throat: Mucous membranes are moist.  Neck: No stridor.    Cardiovascular: Normal rate, regular rhythm. Normal S1 and S2.  Good peripheral circulation. Respiratory: Normal respiratory effort without tachypnea or retractions. Lungs CTAB. Good air entry to the bases with no decreased or absent breath sounds. Musculoskeletal: Full range of motion to all extremities. No gross deformities appreciated.  Visualization of the back and bilateral lower extremities revealed no visible signs of trauma.  No shortening or rotation of either hip.  Patient is tender to palpation over the sacral region.  No tenderness over the lumbar spine.  No tenderness over the SI joints, bilateral pelvic crest, hips.  Good range of motion to bilateral lower extremities.  Negative straight leg raise bilaterally.  Dorsalis pedis pulse and sensation intact and equal in bilateral lower extremities. Neurologic:  Normal speech and language. No gross focal neurologic deficits are appreciated.  Skin:  Skin is warm, dry and intact. No rash noted. Psychiatric: Mood and affect are normal. Speech and behavior are normal. Patient exhibits appropriate insight and  judgement.   ____________________________________________   LABS (all labs ordered are listed, but only abnormal results are displayed)  Labs Reviewed - No data to display ____________________________________________  EKG   ____________________________________________  RADIOLOGY I personally viewed and evaluated these images as part of my medical decision making, as well as reviewing the written report by the radiologist.  ED Provider Interpretation: Visualization of imaging revealed sacral fracture with no other traumatic findings.  DG Lumbar Spine 2-3 Views  Result Date: 01/04/2020 CLINICAL DATA:  [place], low back pain, coccygeal pain EXAM: LUMBAR SPINE - 2-3 VIEW COMPARISON:  02/27/2019 FINDINGS: Frontal and lateral views of the lumbar spine are obtained. There are 5 non-rib-bearing lumbar type vertebral bodies in anatomic alignment. There is diffuse facet hypertrophy. Multilevel spondylosis most pronounced at the thoracolumbar and lumbosacral junctions. Grossly stable appearance since previous exam. Sacroiliac joints are normal. IMPRESSION: 1.  Stable multilevel spondylosis and facet hypertrophy. No acute fracture. Electronically Signed   By: Randa Ngo M.D.   On: 01/04/2020 20:04   DG Pelvis 1-2 Views  Result Date: 01/04/2020 CLINICAL DATA:  [place], low back pain EXAM: PELVIS - 1-2 VIEW COMPARISON:  None. FINDINGS: Single frontal view of the pelvis was performed. No acute displaced fracture. Symmetrical bilateral hip osteoarthritis. Extensive lower lumbar spondylosis. Sacroiliac joints are normal. Soft tissues are unremarkable. IMPRESSION: 1. Degenerative changes of the lumbar spine and bilateral hips. No acute displaced fracture. Electronically Signed   By: Randa Ngo M.D.   On: 01/04/2020 20:00   DG Sacrum/Coccyx  Result Date: 01/04/2020 CLINICAL DATA:  Low back pain. EXAM: SACRUM AND COCCYX - 2+ VIEW COMPARISON:  January 04, 2020 FINDINGS: No displaced oblique fracture  through the sacrum, at the level of S2. Surgical clips in the pelvis, possible fallopian tube closure devices. Soft tissues otherwise the report. IMPRESSION: Nondisplaced oblique fracture through the sacrum, at the level of S2. The fracture line extends to the level of the right neural foramen. Electronically Signed   By: Fidela Salisbury M.D.   On: 01/04/2020 20:00    ____________________________________________    PROCEDURES  Procedure(s) performed:    Procedures    Medications  oxyCODONE-acetaminophen (PERCOCET/ROXICET) 5-325 MG per tablet 1 tablet (1 tablet Oral Given 01/04/20 2104)     ____________________________________________   INITIAL IMPRESSION / ASSESSMENT AND PLAN / ED COURSE  Pertinent labs & imaging results that were available during my care of the patient were reviewed by me and considered in my medical decision making (see chart for details).  Review of the [place] CSRS was performed in accordance of the [place] prior to dispensing any controlled drugs.           Patient's diagnosis is consistent with sacral fracture.  Patient presented to the emergency department after falling and landing on her buttocks.  Patient was tender over the sacrum and is confirmed with imaging to be a sacral fracture.  Patient had no other complaints.  Remainder of imaging of the lumbar spine, pelvis was reassuring.  Patient will pain medications prescribed.  Follow-up primary care or orthopedics as needed.  No indication for further work-up at this time..  Patient is given ED precautions to return to the ED for any worsening or new symptoms.     ____________________________________________  FINAL CLINICAL IMPRESSION(S) / ED DIAGNOSES  Final diagnoses:  Fall, initial encounter  Closed fracture of sacrum, unspecified portion of sacrum, initial encounter ([place])      NEW MEDICATIONS STARTED DURING THIS VISIT:  ED Discharge Orders         Ordered    oxyCODONE-acetaminophen  (PERCOCET/ROXICET) 5-325 MG tablet  Every 6 hours PRN        01/04/20 2101              This chart was dictated using voice recognition software/Dragon. Despite best efforts to proofread, errors can occur which can change the meaning. Any change was purely unintentional.    Darletta Moll, PA-C 01/04/20 2318    Nena Polio, MD 01/05/20 [phone]

## 2021-04-09 ENCOUNTER — Other Ambulatory Visit: Payer: Self-pay

## 2021-04-09 ENCOUNTER — Inpatient Hospital Stay: Payer: HMO

## 2021-04-09 ENCOUNTER — Inpatient Hospital Stay
Admission: EM | Admit: 2021-04-09 | Discharge: 2021-04-13 | DRG: 683 | Disposition: A | Discharge status: Home / Self Care | Payer: HMO | Attending: Internal Medicine | Admitting: Internal Medicine

## 2021-04-09 DIAGNOSIS — R197 Diarrhea, unspecified: Secondary | ICD-10-CM | POA: Diagnosis not present

## 2021-04-09 DIAGNOSIS — I5032 Chronic diastolic (congestive) heart failure: Secondary | ICD-10-CM | POA: Diagnosis present

## 2021-04-09 DIAGNOSIS — R0902 Hypoxemia: Secondary | ICD-10-CM | POA: Diagnosis present

## 2021-04-09 DIAGNOSIS — Z7984 Long term (current) use of oral hypoglycemic drugs: Secondary | ICD-10-CM

## 2021-04-09 DIAGNOSIS — K8020 Calculus of gallbladder without cholecystitis without obstruction: Secondary | ICD-10-CM | POA: Diagnosis present

## 2021-04-09 DIAGNOSIS — E1122 Type 2 diabetes mellitus with diabetic chronic kidney disease: Secondary | ICD-10-CM

## 2021-04-09 DIAGNOSIS — Z951 Presence of aortocoronary bypass graft: Secondary | ICD-10-CM | POA: Diagnosis not present

## 2021-04-09 DIAGNOSIS — I2510 Atherosclerotic heart disease of native coronary artery without angina pectoris: Secondary | ICD-10-CM | POA: Diagnosis present

## 2021-04-09 DIAGNOSIS — R680 Hypothermia, not associated with low environmental temperature: Secondary | ICD-10-CM | POA: Diagnosis present

## 2021-04-09 DIAGNOSIS — E11649 Type 2 diabetes mellitus with hypoglycemia without coma: Secondary | ICD-10-CM | POA: Diagnosis present

## 2021-04-09 DIAGNOSIS — I5033 Acute on chronic diastolic (congestive) heart failure: Secondary | ICD-10-CM | POA: Diagnosis present

## 2021-04-09 DIAGNOSIS — Z87891 Personal history of nicotine dependence: Secondary | ICD-10-CM

## 2021-04-09 DIAGNOSIS — Z794 Long term (current) use of insulin: Secondary | ICD-10-CM | POA: Diagnosis not present

## 2021-04-09 DIAGNOSIS — R110 Nausea: Secondary | ICD-10-CM | POA: Diagnosis not present

## 2021-04-09 DIAGNOSIS — Z7902 Long term (current) use of antithrombotics/antiplatelets: Secondary | ICD-10-CM

## 2021-04-09 DIAGNOSIS — E119 Type 2 diabetes mellitus without complications: Secondary | ICD-10-CM

## 2021-04-09 DIAGNOSIS — D509 Iron deficiency anemia, unspecified: Secondary | ICD-10-CM | POA: Diagnosis present

## 2021-04-09 DIAGNOSIS — N184 Chronic kidney disease, stage 4 (severe): Secondary | ICD-10-CM | POA: Diagnosis present

## 2021-04-09 DIAGNOSIS — R112 Nausea with vomiting, unspecified: Secondary | ICD-10-CM

## 2021-04-09 DIAGNOSIS — R0600 Dyspnea, unspecified: Secondary | ICD-10-CM

## 2021-04-09 DIAGNOSIS — N179 Acute kidney failure, unspecified: Secondary | ICD-10-CM | POA: Diagnosis present

## 2021-04-09 DIAGNOSIS — E875 Hyperkalemia: Secondary | ICD-10-CM | POA: Diagnosis present

## 2021-04-09 DIAGNOSIS — T68XXXA Hypothermia, initial encounter: Secondary | ICD-10-CM | POA: Diagnosis present

## 2021-04-09 DIAGNOSIS — E669 Obesity, unspecified: Secondary | ICD-10-CM | POA: Diagnosis present

## 2021-04-09 DIAGNOSIS — R0602 Shortness of breath: Secondary | ICD-10-CM

## 2021-04-09 DIAGNOSIS — Z20822 Contact with and (suspected) exposure to covid-19: Secondary | ICD-10-CM | POA: Diagnosis present

## 2021-04-09 DIAGNOSIS — E162 Hypoglycemia, unspecified: Secondary | ICD-10-CM | POA: Diagnosis present

## 2021-04-09 DIAGNOSIS — Z6836 Body mass index (BMI) 36.0-36.9, adult: Secondary | ICD-10-CM | POA: Diagnosis not present

## 2021-04-09 DIAGNOSIS — Z955 Presence of coronary angioplasty implant and graft: Secondary | ICD-10-CM | POA: Diagnosis not present

## 2021-04-09 DIAGNOSIS — R52 Pain, unspecified: Secondary | ICD-10-CM

## 2021-04-09 DIAGNOSIS — Z79899 Other long term (current) drug therapy: Secondary | ICD-10-CM

## 2021-04-09 DIAGNOSIS — Z8051 Family history of malignant neoplasm of kidney: Secondary | ICD-10-CM

## 2021-04-09 DIAGNOSIS — I130 Hypertensive heart and chronic kidney disease with heart failure and stage 1 through stage 4 chronic kidney disease, or unspecified chronic kidney disease: Secondary | ICD-10-CM | POA: Diagnosis present

## 2021-04-09 LAB — CBC WITH DIFFERENTIAL/PLATELET
Abs Immature Granulocytes: 0.05 10*3/uL (ref 0.00–0.07)
Basophils Absolute: 0 10*3/uL (ref 0.0–0.1)
Basophils Relative: 0 %
Eosinophils Absolute: 0.1 10*3/uL (ref 0.0–0.5)
Eosinophils Relative: 1 %
HCT: 33.1 % — ABNORMAL LOW (ref 36.0–46.0)
Hemoglobin: 10 g/dL — ABNORMAL LOW (ref 12.0–15.0)
Immature Granulocytes: 1 %
Lymphocytes Relative: 8 %
Lymphs Abs: 0.6 10*3/uL — ABNORMAL LOW (ref 0.7–4.0)
MCH: 26.5 pg (ref 26.0–34.0)
MCHC: 30.2 g/dL (ref 30.0–36.0)
MCV: 87.8 fL (ref 80.0–100.0)
Monocytes Absolute: 0.4 10*3/uL (ref 0.1–1.0)
Monocytes Relative: 5 %
Neutro Abs: 6.3 10*3/uL (ref 1.7–7.7)
Neutrophils Relative %: 85 %
Platelets: 199 10*3/uL (ref 150–400)
RBC: 3.77 MIL/uL — ABNORMAL LOW (ref 3.87–5.11)
RDW: 15.4 % (ref 11.5–15.5)
WBC: 7.4 10*3/uL (ref 4.0–10.5)
nRBC: 0 % (ref 0.0–0.2)

## 2021-04-09 LAB — TROPONIN I (HIGH SENSITIVITY)
Troponin I (High Sensitivity): 4 ng/L (ref <18)
Troponin I (High Sensitivity): 4 ng/L (ref <18)

## 2021-04-09 LAB — HIV ANTIBODY (ROUTINE TESTING W REFLEX): HIV Screen 4th Generation wRfx: NONREACTIVE

## 2021-04-09 LAB — POTASSIUM
Potassium: 5.2 mmol/L — ABNORMAL HIGH (ref 3.5–5.1)
Potassium: 5.4 mmol/L — ABNORMAL HIGH (ref 3.5–5.1)
Potassium: 5.4 mmol/L — ABNORMAL HIGH (ref 3.5–5.1)

## 2021-04-09 LAB — GLUCOSE, CAPILLARY
Glucose-Capillary: 171 mg/dL — ABNORMAL HIGH (ref 70–99)
Glucose-Capillary: 225 mg/dL — ABNORMAL HIGH (ref 70–99)

## 2021-04-09 LAB — CBG MONITORING, ED
Glucose-Capillary: 104 mg/dL — ABNORMAL HIGH (ref 70–99)
Glucose-Capillary: 136 mg/dL — ABNORMAL HIGH (ref 70–99)
Glucose-Capillary: 46 mg/dL — ABNORMAL LOW (ref 70–99)

## 2021-04-09 LAB — BASIC METABOLIC PANEL
Anion gap: 8 (ref 5–15)
BUN: 59 mg/dL — ABNORMAL HIGH (ref 8–23)
CO2: 21 mmol/L — ABNORMAL LOW (ref 22–32)
Calcium: 8 mg/dL — ABNORMAL LOW (ref 8.9–10.3)
Chloride: 111 mmol/L (ref 98–111)
Creatinine, Ser: 3.18 mg/dL — ABNORMAL HIGH (ref 0.44–1.00)
GFR, Estimated: 16 mL/min — ABNORMAL LOW (ref >60)
Glucose, Bld: 117 mg/dL — ABNORMAL HIGH (ref 70–99)
Potassium: 5.5 mmol/L — ABNORMAL HIGH (ref 3.5–5.1)
Sodium: 140 mmol/L (ref 135–145)

## 2021-04-09 LAB — LACTIC ACID, PLASMA
Lactic Acid, Venous: 0.4 mmol/L — ABNORMAL LOW (ref 0.5–1.9)
Lactic Acid, Venous: 0.4 mmol/L — ABNORMAL LOW (ref 0.5–1.9)

## 2021-04-09 MED ORDER — ONDANSETRON HCL 4 MG/2ML IJ SOLN
4.0000 mg | Freq: Four times a day (QID) | INTRAMUSCULAR | Status: DC | PRN
  Administered 2021-04-12: 4 mg via INTRAVENOUS
  Filled 2021-04-09: qty 2

## 2021-04-09 MED ORDER — SODIUM CHLORIDE 0.9 % IV SOLN: INTRAVENOUS | Status: DC

## 2021-04-09 MED ORDER — PRAVASTATIN SODIUM 20 MG PO TABS
80.0000 mg | ORAL_TABLET | Freq: Every day | ORAL | Status: DC
  Administered 2021-04-09 – 2021-04-13 (×5): 80 mg via ORAL
  Filled 2021-04-09 (×2): qty 4
  Filled 2021-04-09: qty 2
  Filled 2021-04-09 (×2): qty 4

## 2021-04-09 MED ORDER — ACETAMINOPHEN 650 MG RE SUPP: 650.0000 mg | Freq: Four times a day (QID) | RECTAL | Status: DC | PRN

## 2021-04-09 MED ORDER — DEXTROSE 50 % IV SOLN: 1.0000 | Freq: Once | INTRAVENOUS | Status: AC

## 2021-04-09 MED ORDER — DULOXETINE HCL 30 MG PO CPEP
60.0000 mg | ORAL_CAPSULE | Freq: Every day | ORAL | Status: DC
  Administered 2021-04-09 – 2021-04-13 (×5): 60 mg via ORAL
  Filled 2021-04-09: qty 1
  Filled 2021-04-09 (×4): qty 2

## 2021-04-09 MED ORDER — ONDANSETRON HCL 4 MG PO TABS
4.0000 mg | ORAL_TABLET | Freq: Four times a day (QID) | ORAL | Status: DC | PRN
  Administered 2021-04-11 – 2021-04-13 (×3): 4 mg via ORAL
  Filled 2021-04-09 (×3): qty 1

## 2021-04-09 MED ORDER — SODIUM CHLORIDE 0.9 % IV BOLUS
1000.0000 mL | Freq: Once | INTRAVENOUS | Status: AC
  Administered 2021-04-09: 1000 mL via INTRAVENOUS

## 2021-04-09 MED ORDER — DEXTROSE 50 % IV SOLN
INTRAVENOUS | Status: AC
  Administered 2021-04-09: 50 mL via INTRAVENOUS
  Filled 2021-04-09: qty 50

## 2021-04-09 MED ORDER — ENOXAPARIN SODIUM 30 MG/0.3ML IJ SOSY
30.0000 mg | PREFILLED_SYRINGE | INTRAMUSCULAR | Status: DC
  Administered 2021-04-09 – 2021-04-12 (×4): 30 mg via SUBCUTANEOUS
  Filled 2021-04-09 (×4): qty 0.3

## 2021-04-09 MED ORDER — CLOPIDOGREL BISULFATE 75 MG PO TABS
75.0000 mg | ORAL_TABLET | Freq: Every day | ORAL | Status: DC
  Administered 2021-04-09 – 2021-04-13 (×5): 75 mg via ORAL
  Filled 2021-04-09 (×5): qty 1

## 2021-04-09 MED ORDER — DEXTROSE-NACL 5-0.9 % IV SOLN: INTRAVENOUS | Status: DC

## 2021-04-09 MED ORDER — ACETAMINOPHEN 325 MG PO TABS
650.0000 mg | ORAL_TABLET | Freq: Four times a day (QID) | ORAL | Status: DC | PRN
  Administered 2021-04-10 (×2): 650 mg via ORAL
  Filled 2021-04-09 (×3): qty 2

## 2021-04-09 MED ORDER — SODIUM ZIRCONIUM CYCLOSILICATE 10 G PO PACK
10.0000 g | PACK | Freq: Every day | ORAL | Status: DC
  Filled 2021-04-09 (×2): qty 1

## 2021-04-09 MED ORDER — CARVEDILOL 25 MG PO TABS
25.0000 mg | ORAL_TABLET | Freq: Two times a day (BID) | ORAL | Status: DC
  Administered 2021-04-09 – 2021-04-13 (×8): 25 mg via ORAL
  Filled 2021-04-09 (×8): qty 1

## 2021-04-09 NOTE — ED Notes (Signed)
CBG obtained resulting 46, Dr. Francine Graven made aware, Pt given ampule of D50 and IVF changed. See mar for details

## 2021-04-09 NOTE — ED Notes (Signed)
Attempting EKG with portable machine.

## 2021-04-09 NOTE — ED Notes (Signed)
Repeat EKG obtained. NSR.

## 2021-04-09 NOTE — ED Notes (Signed)
V6 not reading on EKG. Unable to obtain 12 lead yet. Also temp not reading. Will continue trying both.

## 2021-04-09 NOTE — Progress Notes (Signed)
Arrived to 2A 243 from ED via bed. Placed on cardiac monitor. Purewick placed. Vitals obtained and patient oriented to unit.  Bed alarm in place and call bell within reach.

## 2021-04-09 NOTE — H&P (Signed)
History and Physical    Patient: Tona Qualley [id number] DOB: 09-30-1955 DOA: 04/09/2021 DOS: the patient was seen and examined on 04/09/2021 PCP: Lowella Bandy, MD  Patient coming from: Home  Chief Complaint:  Chief Complaint  Patient presents with   Hypoglycemia    HPI: Cresta Riden is a 66 y.o. female with medical history significant for chronic diastolic dysfunction CHF, hypertension, diabetes who was brought into the ER for evaluation by EMS after her sister found her unresponsive at home about 12:30 PM. Per EMS patient's initial blood sugar was 28 and she received 250 cc of D10.  She was found to be hypoxic with room air pulse oximetry of 75% and placed on a nonrebreather prior to transport to the emergency room. She is currently on room air with pulse oximetry of 92% During my evaluation, patient is awake and alert.  She states that she took 25 units of Lantus which is her prescribed dose and she is also on metformin which she took.  She also ate dinner prior to going to bed. She denies having any nausea, no vomiting, no diarrhea, no fever, no chills, no abdominal pain, no urinary symptoms, no headache, no leg swelling, no dizziness or lightheadedness. Patient remains hypothermic and is currently on a Retail banker.  Review of Systems: As mentioned in the history of present illness. All other systems reviewed and are negative. Past Medical History:  Diagnosis Date   Diabetes mellitus without complication ([place])    Hypertension    Psoriasis    Past Surgical History:  Procedure Laterality Date   CESAREAN SECTION     2 c-sections   CORONARY ARTERY BYPASS GRAFT     CORONARY STENT PLACEMENT     HERNIA REPAIR     TUBAL LIGATION     Social History:  reports that she quit smoking about 18 years ago. She has never used smokeless tobacco. She reports that she does not drink alcohol and does not use drugs.  No Known Allergies  History reviewed. No pertinent family  history.  Prior to Admission medications   Medication Sig Start Date End Date Taking? Authorizing Provider  amLODipine (NORVASC) 5 MG tablet Take 10 mg by mouth daily at 6 (six) AM. 03/07/19 03/06/20  [provider]  carvedilol (COREG) 25 MG tablet Take 25 mg by mouth 2 (two) times daily. 03/07/19   [provider]  clopidogrel (PLAVIX) 75 MG tablet Take 75 mg by mouth daily. 03/28/19   [provider]  dicyclomine (BENTYL) 20 MG tablet Take 1 tablet (20 mg total) by mouth 3 (three) times daily as needed for spasms. 02/27/19   Carrie Mew, MD  DULoxetine (CYMBALTA) 60 MG capsule Take 60 mg by mouth daily. 03/28/19   [provider]  furosemide (LASIX) 20 MG tablet Take 1 tablet (20 mg total) by mouth daily. 04/15/19   Jennye Boroughs, MD  LANTUS SOLOSTAR 100 UNIT/ML Solostar Pen Inject 10 Units into the skin at bedtime. 03/07/19   [provider]  lisinopril (ZESTRIL) 40 MG tablet Take 40 mg by mouth daily. 03/28/19   [provider]  metFORMIN (GLUCOPHAGE) 500 MG tablet Take 1,000 mg by mouth 2 (two) times daily with a meal. 03/07/19   [provider]  ondansetron (ZOFRAN ODT) 4 MG disintegrating tablet Take 1 tablet (4 mg total) by mouth every 8 (eight) hours as needed for nausea or vomiting. 02/27/19   Carrie Mew, MD  oxyCODONE-acetaminophen (PERCOCET/ROXICET) 5-325 MG tablet Take 1  tablet by mouth every 6 (six) hours as needed for severe pain. 01/04/20   Cuthriell, Charline Bills, PA-C  pravastatin (PRAVACHOL) 80 MG tablet Take 80 mg by mouth daily. 03/07/19   [provider]    Physical Exam: Vitals:   04/09/21 1328 04/09/21 1330 04/09/21 1343 04/09/21 1400  BP: (!) 141/48   (!) 119/101  Pulse: 73   65  Resp: 13   14  Temp:   (!) 91.7 F (33.2 C)   TempSrc:   Rectal   SpO2: 94%  (!) 84% 99%  Weight:  94.3 kg    Height:  5\' 3"  (1.6 m)     Physical Exam Vitals and nursing note reviewed.  Constitutional:       Appearance: She is normal weight.  HENT:     Head: Normocephalic and atraumatic.     Nose: Nose normal.     Mouth/Throat:     Mouth: Mucous membranes are moist.  Eyes:     Pupils: Pupils are equal, round, and reactive to light.  Cardiovascular:     Rate and Rhythm: Normal rate.  Pulmonary:     Effort: Pulmonary effort is normal.     Breath sounds: Normal breath sounds.  Abdominal:     General: Abdomen is flat. Bowel sounds are normal.     Palpations: Abdomen is soft.  Musculoskeletal:        General: Normal range of motion.     Cervical back: Normal range of motion.  Skin:    General: Skin is warm and dry.  Neurological:     General: No focal deficit present.     Mental Status: She is alert and oriented to person, place, and time. Mental status is at baseline.  Psychiatric:        Mood and Affect: Mood normal.        Behavior: Behavior normal.     Data Reviewed: Notes from primary care and specialist visits, past discharge summaries. Prior diagnostic testing as applicable to current admission diagnoses Updated medications and problem lists for reconciliation ED course, including vitals, labs, imaging, treatment and response to treatment Triage notes and ED providers notes Labs reviewed, potassium 5.5, bicarb of 21, BUN 59, serum creatinine 3.18 compared to baseline of 0.7 from a year ago CBC reviewed and within normal limits Twelve-lead EKG reviewed by me shows sinus rhythm Chest x-ray ordered results pending Results are pending, will review when available.  Assessment and Plan: Principal Problem:   AKI (acute kidney injury) ([place]) Active Problems:   Type 2 diabetes mellitus (H[facility])   Hypoglycemia   Chronic diastolic CHF (congestive heart failure) (H[facility])   Hypothermia   Acute kidney injury Appears to be medication induced At baseline patient has a serum creatinine of 0.74 today on admission it is 3.18, BUN is also elevated We will hold lisinopril and torsemide IV  fluid hydration Repeat renal parameters in a.m. and if no improvement will consult nephrology     Type 2 diabetes mellitus with complications of hypoglycemia Most likely secondary to AKI Patient's blood sugar improved following administration of dextrose containing fluids Hold Lantus and metformin Encourage oral intake Blood sugar checks every 4 hours     Hypothermia Patient remains on Bair hugger Supportive care     Chronic diastolic dysfunction CHF Stable and not acutely exacerbated Continue carvedilol Hold lisinopril and furosemide    Advance Care Planning:   Code Status: Full Code   Consults: None  Family Communication: Greater than  50% of time was spent discussing patient's condition and plan of care with her at the bedside.  All questions and concerns have been addressed.  She verbalizes understanding and agrees with the plan.  Severity of Illness: The appropriate patient status for this patient is INPATIENT. Inpatient status is judged to be reasonable and necessary in order to provide the required intensity of service to ensure the patient's safety. The patient's presenting symptoms, physical exam findings, and initial radiographic and laboratory data in the context of their chronic comorbidities is felt to place them at high risk for further clinical deterioration. Furthermore, it is not anticipated that the patient will be medically stable for discharge from the hospital within 2 midnights of admission.   * I certify that at the point of admission it is my clinical judgment that the patient will require inpatient hospital care spanning beyond 2 midnights from the point of admission due to high intensity of service, high risk for further deterioration and high frequency of surveillance required.*  Author: Collier Bullock, MD 04/09/2021 3:10 PM  For on call review [email or web address].

## 2021-04-09 NOTE — ED Notes (Deleted)
Pt sister

## 2021-04-09 NOTE — ED Notes (Signed)
Bear hugger applied, warm blankets applied. EDP informed of rectal T 91.7, still attempting EKG.

## 2021-04-09 NOTE — ED Notes (Signed)
Informed RN bed assigned 

## 2021-04-09 NOTE — ED Triage Notes (Signed)
Pt to ED from home [facility] Sister came to house to get pt around 1230pm to go visit mother, found pt unresponsive. Pt has hx DM type 2, takes insulin once daily.   Initial EMS CBG was 28. 272mL D10 given, 18g EMS IV to R AC. Last CBG was 138.  Pt was also found to be hypoxic, at 75% on RA. Placed on NRB.  Pt now 92% on RA. CBG is now 102. Pt is alert and oriented. EDP at bedside.

## 2021-04-09 NOTE — ED Provider Notes (Signed)
[facility] Provider Note    Event Date/Time   First MD Initiated Contact with Patient 04/09/21 1320     (approximate)   History   Hypoglycemia   HPI  Katrina Ortiz is a 66 y.o. female with history of diabetes on metformin and Lantus, hypertension, and CHF who presents with an episode of unresponsiveness.  Per EMS, the patient was found unresponsive this afternoon, and when they checked her blood glucose it was in the 20s.  The patient received 250 mL of D10 and became alert and oriented x3.  The patient denies any acute complaints at this time.  She states that she believes she took her Lantus normally last night.  She states that she ate normally yesterday and had no other significant changes to her routine.    Physical Exam   Triage Vital Signs: ED Triage Vitals  Enc Vitals Group     BP 04/09/21 1328 (!) 141/48     Pulse Rate 04/09/21 1328 73     Resp 04/09/21 1328 13     Temp 04/09/21 1343 (!) 91.7 F (33.2 C)     Temp Source 04/09/21 1343 Rectal     SpO2 04/09/21 1328 94 %     Weight 04/09/21 1330 208 lb (94.3 kg)     Height 04/09/21 1330 5\' 3"  (1.6 m)     Head Circumference --      Peak Flow --      Pain Score 04/09/21 1329 0     Pain Loc --      Pain Edu? --      Excl. in [place]? --     Most recent vital signs: Vitals:   04/09/21 1343 04/09/21 1400  BP:  (!) 119/101  Pulse:  65  Resp:  14  Temp: (!) 91.7 F (33.2 C)   SpO2: (!) 84% 99%     General: Alert, oriented x4, no distress.  CV:  Good peripheral perfusion.  Normal heart sounds. Resp:  Lungs CTAB.  Normal effort.  Abd:  No distention.  Other:  Mucous membranes are somewhat dry.  EOMI.  PERRLA.  Motor and sensory intact in all extremities.   ED Results / Procedures / Treatments   Labs (all labs ordered are listed, but only abnormal results are displayed) Labs Reviewed  BASIC METABOLIC PANEL - Abnormal; Notable for the following components:      Result Value    Potassium 5.5 (*)    CO2 21 (*)    Glucose, Bld 117 (*)    BUN 59 (*)    Creatinine, Ser 3.18 (*)    Calcium 8.0 (*)    GFR, Estimated 16 (*)    All other components within normal limits  CBC WITH DIFFERENTIAL/PLATELET - Abnormal; Notable for the following components:   RBC 3.77 (*)    Hemoglobin 10.0 (*)    HCT 33.1 (*)    Lymphs Abs 0.6 (*)    All other components within normal limits  LACTIC ACID, PLASMA - Abnormal; Notable for the following components:   Lactic Acid, Venous 0.4 (*)    All other components within normal limits  CBG MONITORING, ED - Abnormal; Notable for the following components:   Glucose-Capillary 104 (*)    All other components within normal limits  URINALYSIS, ROUTINE W REFLEX MICROSCOPIC  LACTIC ACID, PLASMA  HIV ANTIBODY (ROUTINE TESTING W REFLEX)  CBG MONITORING, ED  CBG MONITORING, ED  CBG MONITORING, ED  CBG MONITORING, ED  TROPONIN I (HIGH SENSITIVITY)     EKG  ED ECG REPORT I, Arta Silence, the attending physician, personally viewed and interpreted this ECG.  Date: 04/09/2021 EKG Time: 1354 Rate: Approximately 75 Rhythm: normal sinus rhythm QRS Axis: normal Intervals: normal ST/T Wave abnormalities: Nonspecific abnormalities Narrative Interpretation: Nonspecific abnormalities with no evidence of acute ischemia; EKG interpretation limited due to poor EKG baseline    RADIOLOGY    PROCEDURES:  Critical Care performed: No  Procedures   MEDICATIONS ORDERED IN ED: Medications  carvedilol (COREG) tablet 25 mg (has no administration in time range)  pravastatin (PRAVACHOL) tablet 80 mg (has no administration in time range)  DULoxetine (CYMBALTA) DR capsule 60 mg (has no administration in time range)  clopidogrel (PLAVIX) tablet 75 mg (has no administration in time range)  enoxaparin (LOVENOX) injection 30 mg (has no administration in time range)  acetaminophen (TYLENOL) tablet 650 mg (has no administration in time range)     Or  acetaminophen (TYLENOL) suppository 650 mg (has no administration in time range)  ondansetron (ZOFRAN) tablet 4 mg (has no administration in time range)    Or  ondansetron (ZOFRAN) injection 4 mg (has no administration in time range)  0.9 %  sodium chloride infusion (has no administration in time range)  sodium chloride 0.9 % bolus 1,000 mL (1,000 mLs Intravenous New Bag/Given 04/09/21 1424)     IMPRESSION / MDM / ASSESSMENT AND PLAN / ED COURSE  I reviewed the triage vital signs and the nursing notes.  66 year old female with PMH as noted above including diabetes on Lantus and metformin presents with an episode of unresponsiveness.  The patient was found to be hypoglycemic.  After receiving D10 she is now at her baseline mental status and denies any specific complaints.  She states she took Lantus last night and is unsure why she got hypoglycemic.  On exam the vital signs are normal except for hypothermia.  Neurologic exam is nonfocal.  Mucous membranes are dry.  Exam is otherwise as described above.  Overall presentation is consistent with hypoglycemia due to insulin, although it is not clear if the patient may have missed a meal or had another precipitating factor.  I suspect that the hypothermia is secondary to hypoglycemia rather than indicative of acute infection/sepsis as the patient has no other focal symptoms.  We will obtain basic labs, lactate, urinalysis, and observe the patient over the next few hours.  The patient is on the cardiac monitor to evaluate for evidence of arrhythmia and/or significant heart rate changes.  ----------------------------------------- 2:49 PM on 04/09/2021 -----------------------------------------  Lab work-up is significant for AKI, with a creatinine of 3.18.  I have ordered IV fluids.  Lactate is normal.  There is still no evidence for sepsis.  I suspect that the hypothermia is secondary to hypoglycemia.  However, due to these multiple lab findings  the patient will require admission.  I consulted Dr. Francine Graven from the hospitalist service; based on our discussion she agrees to admit the patient.    FINAL CLINICAL IMPRESSION(S) / ED DIAGNOSES   Final diagnoses:  Hypoglycemia  AKI (acute kidney injury) ([place])  Hypothermia, initial encounter     Rx / DC Orders   ED Discharge Orders     None        Note:  This document was prepared using Dragon voice recognition software and may include unintentional dictation errors.    Arta Silence, MD 04/09/21 1450

## 2021-04-09 NOTE — ED Notes (Signed)
Portable EKG machine also not picking up all leads. CN informed and is helping to troubleshoot. IT ticket placed for phillips monitor in room.

## 2021-04-10 DIAGNOSIS — I5032 Chronic diastolic (congestive) heart failure: Secondary | ICD-10-CM

## 2021-04-10 DIAGNOSIS — E162 Hypoglycemia, unspecified: Secondary | ICD-10-CM

## 2021-04-10 LAB — BASIC METABOLIC PANEL
Anion gap: 9 (ref 5–15)
BUN: 47 mg/dL — ABNORMAL HIGH (ref 8–23)
CO2: 18 mmol/L — ABNORMAL LOW (ref 22–32)
Calcium: 7.5 mg/dL — ABNORMAL LOW (ref 8.9–10.3)
Chloride: 113 mmol/L — ABNORMAL HIGH (ref 98–111)
Creatinine, Ser: 2.69 mg/dL — ABNORMAL HIGH (ref 0.44–1.00)
GFR, Estimated: 19 mL/min — ABNORMAL LOW (ref >60)
Glucose, Bld: 216 mg/dL — ABNORMAL HIGH (ref 70–99)
Potassium: 4.9 mmol/L (ref 3.5–5.1)
Sodium: 140 mmol/L (ref 135–145)

## 2021-04-10 LAB — GLUCOSE, CAPILLARY
Glucose-Capillary: 159 mg/dL — ABNORMAL HIGH (ref 70–99)
Glucose-Capillary: 201 mg/dL — ABNORMAL HIGH (ref 70–99)
Glucose-Capillary: 202 mg/dL — ABNORMAL HIGH (ref 70–99)
Glucose-Capillary: 210 mg/dL — ABNORMAL HIGH (ref 70–99)
Glucose-Capillary: 216 mg/dL — ABNORMAL HIGH (ref 70–99)
Glucose-Capillary: 226 mg/dL — ABNORMAL HIGH (ref 70–99)
Glucose-Capillary: 229 mg/dL — ABNORMAL HIGH (ref 70–99)

## 2021-04-10 LAB — CBC
HCT: 25.8 % — ABNORMAL LOW (ref 36.0–46.0)
Hemoglobin: 8.3 g/dL — ABNORMAL LOW (ref 12.0–15.0)
MCH: 27.9 pg (ref 26.0–34.0)
MCHC: 32.2 g/dL (ref 30.0–36.0)
MCV: 86.9 fL (ref 80.0–100.0)
Platelets: 190 10*3/uL (ref 150–400)
RBC: 2.97 MIL/uL — ABNORMAL LOW (ref 3.87–5.11)
RDW: 15.4 % (ref 11.5–15.5)
WBC: 7.3 10*3/uL (ref 4.0–10.5)
nRBC: 0 % (ref 0.0–0.2)

## 2021-04-10 MED ORDER — LACTATED RINGERS IV SOLN: INTRAVENOUS | Status: DC

## 2021-04-10 MED ORDER — INSULIN GLARGINE-YFGN 100 UNIT/ML ~~LOC~~ SOLN
10.0000 [IU] | Freq: Every day | SUBCUTANEOUS | Status: DC
  Administered 2021-04-10 – 2021-04-13 (×3): 10 [IU] via SUBCUTANEOUS
  Filled 2021-04-10 (×5): qty 0.1

## 2021-04-10 MED ORDER — SODIUM CHLORIDE 0.9 % IV SOLN: INTRAVENOUS | Status: DC

## 2021-04-10 NOTE — Hospital Course (Addendum)
66 y.o. female with medical history significant for chronic diastolic dysfunction CHF, hypertension, diabetes admitted for hypoxia and hypoglycemia at home.  She was found to have AKI  2/18: Hypoglycemia in hypoxia is now resolved.  Getting IV fluids for AKI 2/19- Nausea, Headache 2/20-persistent nausea, now having loose smelly stools (3 this morning per patient) and 1 episode of vomiting

## 2021-04-10 NOTE — Progress Notes (Signed)
°  Progress Note   Patient: Katrina Ortiz QQI:[id number] DOB: Jun 27, 1955 DOA: 04/09/2021     1 DOS: the patient was seen and examined on 04/10/2021   Brief hospital course: 66 y.o. female with medical history significant for chronic diastolic dysfunction CHF, hypertension, diabetes admitted for hypoxia and hypoglycemia at home.  She was found to have AKI  2/18: Hypoglycemia in hypoxia is now resolved.  Getting IV fluids for AKI   Assessment and Plan: * AKI (acute kidney injury) ([place])- (present on admission) Patient reports underlying history of CKD due to longstanding diabetes followed by [facility] nephrologist.  Continue IV hydration for now  Chronic diastolic CHF (congestive heart failure) ([place])- (present on admission) Well compensated for now.  Continue Coreg, statin.  Holding Lasix due to AKI  Hypoglycemia- (present on admission) Was requiring D10 to maintain her blood sugar on admission.  I have stopped it for now to monitor her sugars  Type 2 diabetes mellitus ([place]) Admitted for hypoglycemia.  We will hold off Jardiance and Lantus for time being.  Discontinuing D10 to monitor blood sugar closely        Subjective: Not hypoglycemic anymore.  Feeling much better  Physical Exam: Vitals:   04/10/21 0017 04/10/21 0324 04/10/21 0840 04/10/21 1119  BP: (!) 141/56 (!) 138/56 (!) 150/61 (!) 131/54  Pulse: 87 81 80 74  Resp: 20 20 16 18   Temp: 98.5 F (36.9 C) 99.1 F (37.3 C) 98.4 F (36.9 C) 98.3 F (36.8 C)  TempSrc:   Oral   SpO2: 92% 94% 96% 93%  Weight:      Height:       Constitutional:      Appearance: She is normal weight.  HENT:     Head: Normocephalic and atraumatic.     Nose: Nose normal.     Mouth/Throat:     Mouth: Mucous membranes are moist.  Eyes:     Pupils: Pupils are equal, round, and reactive to light.  Cardiovascular:     Rate and Rhythm: Normal rate.  Pulmonary:     Effort: Pulmonary effort is normal.     Breath sounds: Normal breath sounds.   Abdominal:     General: Abdomen is flat. Bowel sounds are normal.     Palpations: Abdomen is soft.  Musculoskeletal:        General: Normal range of motion.     Cervical back: Normal range of motion.  Skin:    General: Skin is warm and dry.  Neurological:     General: No focal deficit present.     Mental Status: She is alert and oriented to person, place, and time. Mental status is at baseline.  Psychiatric:        Mood and Affect: Mood normal.        Behavior: Behavior normal.   Data Reviewed:  Creatinine 2.69.  Hemoglobin 8.3  Family Communication: None at bedside  Disposition: Status is: Inpatient Remains inpatient appropriate because: Monitoring kidney function.  Blood sugar     DVT prophylaxis-subcu Lovenox      Planned Discharge Destination: Home     Time spent: 35 minutes  Author: Max Sane, MD 04/10/2021 12:50 PM  For on call review [email or web address].

## 2021-04-10 NOTE — Assessment & Plan Note (Signed)
Admitted for hypoglycemia.  We will hold off Jardiance and Lantus for time being.  Discontinuing D10 to monitor blood sugar closely

## 2021-04-10 NOTE — Assessment & Plan Note (Signed)
Was requiring D10 to maintain her blood sugar on admission.  I have stopped it for now to monitor her sugars

## 2021-04-10 NOTE — Assessment & Plan Note (Signed)
Patient reports underlying history of CKD due to longstanding diabetes followed by [facility] nephrologist.  Continue IV hydration for now

## 2021-04-10 NOTE — Assessment & Plan Note (Addendum)
Well compensated for now.  Continue Coreg, statin.  Holding Lasix due to AKI

## 2021-04-11 ENCOUNTER — Inpatient Hospital Stay: Payer: HMO

## 2021-04-11 DIAGNOSIS — R0600 Dyspnea, unspecified: Secondary | ICD-10-CM

## 2021-04-11 DIAGNOSIS — R110 Nausea: Secondary | ICD-10-CM

## 2021-04-11 DIAGNOSIS — R0602 Shortness of breath: Secondary | ICD-10-CM

## 2021-04-11 DIAGNOSIS — R112 Nausea with vomiting, unspecified: Secondary | ICD-10-CM

## 2021-04-11 LAB — GLUCOSE, CAPILLARY
Glucose-Capillary: 127 mg/dL — ABNORMAL HIGH (ref 70–99)
Glucose-Capillary: 117 mg/dL — ABNORMAL HIGH (ref 70–99)
Glucose-Capillary: 142 mg/dL — ABNORMAL HIGH (ref 70–99)
Glucose-Capillary: 149 mg/dL — ABNORMAL HIGH (ref 70–99)
Glucose-Capillary: 134 mg/dL — ABNORMAL HIGH (ref 70–99)

## 2021-04-11 LAB — CBC
HCT: 26.2 % — ABNORMAL LOW (ref 36.0–46.0)
Hemoglobin: 8.2 g/dL — ABNORMAL LOW (ref 12.0–15.0)
MCH: 27 pg (ref 26.0–34.0)
MCHC: 31.3 g/dL (ref 30.0–36.0)
MCV: 86.2 fL (ref 80.0–100.0)
Platelets: 163 10*3/uL (ref 150–400)
RBC: 3.04 MIL/uL — ABNORMAL LOW (ref 3.87–5.11)
RDW: 15.2 % (ref 11.5–15.5)
WBC: 7.4 10*3/uL (ref 4.0–10.5)
nRBC: 0 % (ref 0.0–0.2)

## 2021-04-11 LAB — BASIC METABOLIC PANEL
Anion gap: 9 (ref 5–15)
BUN: 41 mg/dL — ABNORMAL HIGH (ref 8–23)
CO2: 19 mmol/L — ABNORMAL LOW (ref 22–32)
Calcium: 8.1 mg/dL — ABNORMAL LOW (ref 8.9–10.3)
Chloride: 112 mmol/L — ABNORMAL HIGH (ref 98–111)
Creatinine, Ser: 2.43 mg/dL — ABNORMAL HIGH (ref 0.44–1.00)
GFR, Estimated: 21 mL/min — ABNORMAL LOW (ref >60)
Glucose, Bld: 124 mg/dL — ABNORMAL HIGH (ref 70–99)
Potassium: 5.1 mmol/L (ref 3.5–5.1)
Sodium: 140 mmol/L (ref 135–145)

## 2021-04-11 MED ORDER — LANTUS SOLOSTAR 100 UNIT/ML ~~LOC~~ SOPN: 10.0000 [IU] | PEN_INJECTOR | Freq: Every day | SUBCUTANEOUS | 11 refills | Status: DC

## 2021-04-11 MED ORDER — FUROSEMIDE 10 MG/ML IJ SOLN
40.0000 mg | INTRAMUSCULAR | Status: AC
  Administered 2021-04-11: 40 mg via INTRAVENOUS
  Filled 2021-04-11: qty 4

## 2021-04-11 MED ORDER — ALUM & MAG HYDROXIDE-SIMETH 200-200-20 MG/5ML PO SUSP: 30.0000 mL | ORAL | Status: DC

## 2021-04-11 MED ORDER — FUROSEMIDE 10 MG/ML IJ SOLN
40.0000 mg | Freq: Two times a day (BID) | INTRAMUSCULAR | Status: DC
  Administered 2021-04-11 – 2021-04-12 (×2): 40 mg via INTRAVENOUS
  Filled 2021-04-11 (×2): qty 4

## 2021-04-11 MED ORDER — ALUM & MAG HYDROXIDE-SIMETH 200-200-20 MG/5ML PO SUSP
30.0000 mL | Freq: Four times a day (QID) | ORAL | Status: DC | PRN
Start: 2021-04-11 — End: 2021-04-13
  Administered 2021-04-11 – 2021-04-12 (×2): 30 mL via ORAL
  Filled 2021-04-11 (×2): qty 30

## 2021-04-11 NOTE — Discharge Summary (Signed)
Physician Discharge Summary   Patient: Katrina Ortiz MRN: [id number] DOB: 1955-06-28  Admit date:     04/09/2021  Discharge date: 04/13/21  Discharge Physician: Max Sane   PCP: Lowella Bandy, MD   Recommendations at discharge:    F/up with outpt providers as requested  Discharge Diagnoses: Principal Problem:   AKI (acute kidney injury) [facility]) Active Problems:   Type 2 diabetes mellitus ([place])   Hypoglycemia   Chronic diastolic CHF (congestive heart failure) (HCC)   Hypothermia   SOB (shortness of breath)  Hospital Course: 66 y.o. female with medical history significant for chronic diastolic dysfunction CHF, hypertension, diabetes admitted for hypoxia and hypoglycemia at home.  She was found to have AKI  2/18: Hypoglycemia in hypoxia is now resolved.  Getting IV fluids for AKI  Assessment and Plan: * AKI (acute kidney injury) ([place])- (present on admission) Patient reports underlying history of CKD due to longstanding diabetes followed by [facility] nephrologist.  Her renal function is now at baseline.  AKI might have been due to poor p.o. intake  Chronic diastolic CHF (congestive heart failure) ([place])- (present on admission) Well compensated for now.  Continue Coreg, statin.    Hypoglycemia- (present on admission) Initially required D10 to maintain her blood sugar but now Maintaining sugars without D10.    Type 2 diabetes mellitus (Katrina Ortiz) Requested her to cut back on her insulin Lantus 10 units subcu insulin 25 units discharge          Consultants: Nephrology, GI Disposition: Home Diet recommendation:  Discharge Diet Orders (From admission, onward)     Start     Ordered   04/11/21 0000  Diet - low sodium heart healthy        04/11/21 0751           Carb modified diet  DISCHARGE MEDICATION: Allergies as of 04/11/2021   No Known Allergies      Medication List     STOP taking these medications    valsartan 40 MG tablet Commonly known as: DIOVAN        TAKE these medications    amLODipine 10 MG tablet Commonly known as: NORVASC Take 10 mg by mouth daily.   carvedilol 25 MG tablet Commonly known as: COREG Take 25 mg by mouth 2 (two) times daily.   clopidogrel 75 MG tablet Commonly known as: PLAVIX Take 75 mg by mouth daily.   DULoxetine 60 MG capsule Commonly known as: CYMBALTA Take 60 mg by mouth daily.   furosemide 40 MG tablet Commonly known as: LASIX Take 40 mg by mouth 2 (two) times daily.   gabapentin 300 MG capsule Commonly known as: NEURONTIN Take 300 mg by mouth at bedtime.   Jardiance 10 MG Tabs tablet Generic drug: empagliflozin Take 10 mg by mouth every morning.   Lantus SoloStar 100 UNIT/ML Solostar Pen Generic drug: insulin glargine Inject 10 Units into the skin at bedtime. Hold if sugar < 100 What changed:  how much to take additional instructions   omeprazole 20 MG capsule Commonly known as: PRILOSEC Take 20 mg by mouth daily at 12 noon.   pravastatin 80 MG tablet Commonly known as: PRAVACHOL Take 80 mg by mouth daily.        Follow-up Information     Tawni Levy, NP. Schedule an appointment as soon as possible for a visit in 3 day(s).   Specialty: Family Medicine Why: [facility] Discharge F/UP Contact information: [place] [address]  [phone]         Lowella Bandy, MD. Schedule an appointment as soon as possible for a visit in 1 week(s).   Specialty: Pediatrics Why: [facility] Discharge F/UP Contact information: [place] [address] [phone]                 Discharge Exam: Danley Danker Weights   04/09/21 1330  Weight: 94.3 kg   Constitutional:      Appearance: somewhat edematous mainly in extremities HENT:     Head: Normocephalic and atraumatic.     Nose: Nose normal.     Mouth/Throat:     Mouth: Mucous membranes are moist.  Eyes:     Pupils: Pupils are equal,  round, and reactive to light.  Cardiovascular:     Rate and Rhythm: Normal rate.  Pulmonary:     Effort: Pulmonary effort is normal.     Breath sounds: Normal breath sounds.  Abdominal:     General: Abdomen is flat. Bowel sounds are normal.     Palpations: Generalized minimal pelvic tenderness Musculoskeletal:        General: Normal range of motion.     Cervical back: Normal range of motion.  Skin:    General: Skin is warm and dry.  Neurological:     General: No focal deficit present.     Mental Status: She is alert and oriented to person, place, and time. Mental status is at baseline.  Psychiatric:        Mood and Affect: Mood normal.        Behavior: Behavior normal.   Condition at discharge: good  The results of significant diagnostics from this hospitalization (including imaging, microbiology, ancillary and laboratory) are listed below for reference.   Imaging Studies: CT CHEST WO CONTRAST  Result Date: 04/09/2021 CLINICAL DATA:  Pneumonia. Complication suspected. Found unresponsive. EXAM: CT CHEST WITHOUT CONTRAST TECHNIQUE: Multidetector CT imaging of the chest was performed following the standard protocol without IV contrast. RADIATION DOSE REDUCTION: This exam was performed according to the departmental dose-optimization program which includes automated exposure control, adjustment of the mA and/or kV according to patient size and/or use of iterative reconstruction technique. COMPARISON:  Chest radiography same day. FINDINGS: Cardiovascular: Mild cardiomegaly. No pericardial fluid. Previous median sternotomy and CABG. Aortic atherosclerosis without aneurysm. Mediastinum/Nodes: No mediastinal mass or lymphadenopathy visible on this study done without contrast. Normal size mediastinal nodes. Lungs/Pleura: Small bilateral effusions layering dependently. Mild atelectasis of the dependent right upper lobe. Minimal peripheral scarring in the right middle lobe. Mild dependent atelectasis  in the right lower lobe. In the left lung, there is more extensive infiltrate and collapse in the lower lobe consistent with bronchopneumonia. Follow-up to clearing is recommended. Left upper lobe shows a few scattered patchy opacities consistent with patchy inflammatory infiltrate. Upper Abdomen: Negative Musculoskeletal: Chronic ankylosis throughout the thoracic region, with exception of T7-8 which retains some mobility. IMPRESSION: Bilateral pleural effusions layering dependently, larger on left than the right. Mild dependent pulmonary atelectasis. More extensive infiltrate and volume loss in the left lower lobe suggesting infectious bronchopneumonia. Few other minimal scattered patchy inflammatory type densities also in the left upper lobe. Follow-up to clearing recommended. Aortic Atherosclerosis (ICD10-I70.0). Electronically Signed   By: Nelson Chimes M.D.   On: 04/09/2021 17:09   DG Chest Port 1 View  Result Date: 04/09/2021 CLINICAL DATA:  Unresponsive. EXAM: PORTABLE CHEST 1 VIEW COMPARISON:  04/11/2019 FINDINGS: 1506 hours. The cardio  pericardial silhouette is enlarged. Interstitial markings are diffusely coarsened with chronic features. Retrocardiac collapse/consolidation is associated with a small left pleural effusion. The visualized bony structures of the thorax show no acute abnormality. Telemetry leads overlie the chest. IMPRESSION: Retrocardiac collapse/consolidation with small left pleural effusion. Electronically Signed   By: Misty Stanley M.D.   On: 04/09/2021 15:18    Microbiology: Results for orders placed or performed during the hospital encounter of 04/10/19  Urine Culture     Status: None   Collection Time: 04/10/19  9:44 PM   Specimen: Urine, Clean Catch  Result Value Ref Range Status   Specimen Description   Final    URINE, CLEAN CATCH Performed at [facility], [address], [place], [place] [address]    Special Requests   Final    NONE Performed at [facility], [address], [place], [place] [address]    Culture   Final    NO GROWTH Performed at [facility], [place] [address], [place], [place] [address]    Report Status 04/11/2019 FINAL  Final  Blood culture (routine x 2)     Status: None   Collection Time: 04/10/19  9:48 PM   Specimen: BLOOD RIGHT FOREARM  Result Value Ref Range Status   Specimen Description BLOOD RIGHT FOREARM  Final   Special Requests Blood Culture adequate volume  Final   Culture   Final    NO GROWTH 5 DAYS Performed at [facility], [address] [place], [place] [address]    Report Status 04/15/2019 FINAL  Final  Blood culture (routine x 2)     Status: None   Collection Time: 04/10/19  9:48 PM   Specimen: Left Antecubital; Blood  Result Value Ref Range Status   Specimen Description LEFT ANTECUBITAL  Final   Special Requests Blood Culture adequate volume  Final   Culture   Final    NO GROWTH 5 DAYS Performed at [facility], [address] [place],  [address]    Report Status 04/15/2019 FINAL  Final  Respiratory Panel by RT PCR (Flu A&B, Covid) - Nasopharyngeal Swab     Status: None   Collection Time: 04/10/19 11:29 PM   Specimen: Nasopharyngeal Swab  Result Value Ref Range Status   SARS Coronavirus 2 by RT PCR NEGATIVE NEGATIVE Final    Comment: (NOTE) SARS-CoV-2 target nucleic acids are NOT DETECTED. The SARS-CoV-2 RNA is generally detectable in upper respiratoy specimens during the acute phase of infection. The lowest concentration of SARS-CoV-2 viral copies this assay can detect is 131 copies/mL. A negative result does not preclude SARS-Cov-2 infection and should not be used as the sole basis for treatment or other patient management decisions. A negative result may occur with  improper specimen collection/handling, submission of specimen other than nasopharyngeal swab, presence of viral mutation(s) within the areas targeted by this assay, and inadequate  number of viral copies (<131 copies/mL). A negative result must be combined with clinical observations, patient history, and epidemiological information. The expected result is Negative. Fact Sheet for Patients:  PinkCheek.be Fact Sheet for Healthcare Providers:  GravelBags.it This test is not yet ap proved or cleared by the [place] FDA and  has been authorized for detection and/or diagnosis of SARS-CoV-2 by FDA under an Emergency Use Authorization (EUA). This EUA will remain  in effect (meaning this test can be used) for the duration of the COVID-19 declaration under Section 564(b)(1) of the Act, 21  U.S.C. section 360bbb-3(b)(1), unless the authorization is terminated or revoked sooner.    Influenza A by PCR NEGATIVE NEGATIVE Final   Influenza B by PCR NEGATIVE NEGATIVE Final    Comment: (NOTE) The Xpert Xpress SARS-CoV-2/FLU/RSV assay is intended as an aid in  the diagnosis of influenza from Nasopharyngeal swab specimens and  should not be used as a sole basis for treatment. Nasal washings and  aspirates are unacceptable for Xpert Xpress SARS-CoV-2/FLU/RSV  testing. Fact Sheet for Patients: PinkCheek.be Fact Sheet for Healthcare Providers: GravelBags.it This test is not yet approved or cleared by the [place] FDA and  has been authorized for detection and/or diagnosis of SARS-CoV-2 by  FDA under an Emergency Use Authorization (EUA). This EUA will remain  in effect (meaning this test can be used) for the duration of the  Covid-19 declaration under Section 564(b)(1) of the Act, 21  U.S.C. section 360bbb-3(b)(1), unless the authorization is  terminated or revoked. Performed at [facility], [address] [place], [place] [address]   MRSA PCR Screening     Status: None   Collection Time: 04/11/19  2:13 AM   Specimen: Nasal Mucosa;  Nasopharyngeal  Result Value Ref Range Status   MRSA by PCR NEGATIVE NEGATIVE Final    Comment:        The GeneXpert MRSA Assay (FDA approved for NASAL specimens only), is one component of a comprehensive MRSA colonization surveillance program. It is not intended to diagnose MRSA infection nor to guide or monitor treatment for MRSA infections. Performed at [facility], [address] [place], [place] [address]   Culture, respiratory     Status: None   Collection Time: 04/11/19 11:44 AM   Specimen: Tracheal Aspirate  Result Value Ref Range Status   Specimen Description   Final    TRACHEAL ASPIRATE Performed at [facility], [address], [place], [place] [address]    Special Requests   Final    NONE Performed at [facility], [place], [place] [address]    Gram Stain   Final    ABUNDANT WBC PRESENT,BOTH PMN AND MONONUCLEAR ABUNDANT GRAM POSITIVE COCCI FEW GRAM NEGATIVE RODS RARE GRAM VARIABLE ROD    Culture   Final    Consistent with normal respiratory flora. Performed at [facility], [place] [address], [place],  [address]    Report Status 04/13/2019 FINAL  Final    Labs: CBC: Recent Labs  Lab 04/09/21 1328 04/10/21 0653 04/11/21 0640  WBC 7.4 7.3 7.4  NEUTROABS 6.3  --   --   HGB 10.0* 8.3* 8.2*  HCT 33.1* 25.8* 26.2*  MCV 87.8 86.9 86.2  PLT 199 190 102   Basic Metabolic Panel: Recent Labs  Lab 04/09/21 1328 04/09/21 1641 04/09/21 1839 04/09/21 2236 04/10/21 0653 04/11/21 0640  NA 140  --   --   --  140 140  K 5.5* 5.2* 5.4* 5.4* 4.9 5.1  CL 111  --   --   --  113* 112*  CO2 21*  --   --   --  18* 19*  GLUCOSE 117*  --   --   --  216* 124*  BUN 59*  --   --   --  47* 41*  CREATININE 3.18*  --   --   --  2.69* 2.43*  CALCIUM 8.0*  --   --   --  7.5* 8.1*   Liver Function Tests: No  results for input(s): AST, ALT, ALKPHOS, BILITOT, PROT, ALBUMIN in the last 168 hours. CBG: Recent Labs   Lab 04/10/21 2015 04/10/21 2354 04/11/21 0411 04/11/21 0815 04/11/21 1143  GLUCAP 216* 159* 127* 117* 142*    Discharge time spent: greater than 30 minutes.  Signed: Max Sane, MD [facility] 04/11/2021

## 2021-04-11 NOTE — Assessment & Plan Note (Addendum)
Well compensated for now.  Continue Coreg, statin. Resume Lasix as she might have iatrogenic fluid overload (given for AKI)

## 2021-04-11 NOTE — Assessment & Plan Note (Addendum)
Now also reports diarrhea this morning.  She has had 3 loose bowel movement which were smelly according to patient. We will check stool GI panel and consult GI Her KUB and right upper quadrant ultrasound were within normal limit except cholelithiasis Symptomatic management for now

## 2021-04-11 NOTE — Progress Notes (Signed)
Chaplain responded to Order Requisition, pt requests prayer.  Pt indicated she was not feeling well at all, asked for prayer for relief, healing. Pt is Katrina Ortiz.  Chaplain provided prayer and spiritual support.   Please contact as needed for ongoing support.  [place], [place] Pager:  [phone]   04/11/21 1316  Clinical Encounter Type  Visited With Patient  Visit Type Initial;Spiritual support  Referral From Nurse  Consult/Referral To Chaplain  Spiritual Encounters  Spiritual Needs Prayer  Stress Factors  Patient Stress Factors Health changes

## 2021-04-11 NOTE — Assessment & Plan Note (Addendum)
Patient reports underlying history of CKD (getting work up as an outpt) due to longstanding diabetes followed by [facility] nephrologist.stopping IVF as she is getting more edematous

## 2021-04-11 NOTE — Assessment & Plan Note (Signed)
Sugars normal without D10

## 2021-04-11 NOTE — Assessment & Plan Note (Signed)
Admitted for hypoglycemia.  We will hold off Jardiance and start Lantus 10 units for now.sugars well controlled

## 2021-04-11 NOTE — Progress Notes (Addendum)
°  Progress Note   Patient: Katrina Ortiz PYK:[id number] DOB: 1955/06/23 DOA: 04/09/2021     2 DOS: the patient was seen and examined on 04/11/2021   Brief hospital course: 66 y.o. female with medical history significant for chronic diastolic dysfunction CHF, hypertension, diabetes admitted for hypoxia and hypoglycemia at home.  She was found to have AKI  2/18: Hypoglycemia in hypoxia is now resolved.  Getting IV fluids for AKI 2/19- Nausea, Headache   Assessment and Plan: * AKI (acute kidney injury) ([place])- (present on admission) Patient reports underlying history of CKD (getting work up as an outpt) due to longstanding diabetes followed by [facility] nephrologist.stopping IVF as she is getting more edematous  Nausea With headache. Try Maalox prn and will get KUB as she is not able to eat much yet  Chronic diastolic CHF (congestive heart failure) ([place])- (present on admission) Well compensated for now.  Continue Coreg, statin. Resume Lasix as she might have iatrogenic fluid overload (given for AKI)  Hypoglycemia- (present on admission) Sugars normal without D10  Type 2 diabetes mellitus ([place]) Admitted for hypoglycemia.  We will hold off Jardiance and start Lantus 10 units for now.sugars well controlled        Subjective: feeling nauseous, headache and not able to eat much. Last BM on Thursday.  Physical Exam: Vitals:   04/10/21 1915 04/11/21 0413 04/11/21 0814 04/11/21 1220  BP: (!) 144/71 (!) 141/44 (!) 162/63 (!) 184/96  Pulse: 81 78 81 85  Resp: 20 20 19    Temp: 98.3 F (36.8 C) 98.2 F (36.8 C) 97.7 F (36.5 C) 98.6 F (37 C)  TempSrc: Oral  Oral Oral  SpO2: 93% 92% 94% 96%  Weight:      Height:       Constitutional:      Appearance: somewhat edematous mainly in extremities HENT:     Head: Normocephalic and atraumatic.     Nose: Nose normal.     Mouth/Throat:     Mouth: Mucous membranes are moist.  Eyes:     Pupils: Pupils are equal, round, and reactive to light.   Cardiovascular:     Rate and Rhythm: Normal rate.  Pulmonary:     Effort: Pulmonary effort is normal.     Breath sounds: Normal breath sounds.  Abdominal:     General: Abdomen is flat. Bowel sounds are normal.     Palpations: Abdomen is soft.  Musculoskeletal:        General: Normal range of motion.     Cervical back: Normal range of motion.  Skin:    General: Skin is warm and dry.  Neurological:     General: No focal deficit present.     Mental Status: She is alert and oriented to person, place, and time. Mental status is at baseline.  Psychiatric:        Mood and Affect: Mood normal.        Behavior: Behavior normal.   Data Reviewed:  Creat 2.43, Hb 8.2  Family Communication: none today  Disposition: Status is: Inpatient Remains inpatient appropriate because: nausea, headache   DVT prophylaxis - Lovenox SQ        Planned Discharge Destination: Home     Time spent: 35 minutes  Author: Max Sane, MD 04/11/2021 2:59 PM  For on call review [email or web address].

## 2021-04-11 NOTE — TOC Transition Note (Addendum)
Transition of Care [facility]) - CM/SW Discharge Note Patient Details  Name: [place] Kirchner MRN: [id number] Date of Birth: Jul 22, 1955  [facility] CM/SW Contact:  Izola Price, RN Phone Number: 04/11/2021, 10:09 AM  Clinical Narrative: 2/19: Patient admitted 04/09/21 after being found down by sister and EMS found patient to be hypoglycemic and hypoxic. Medically stable for discharge per provider. No TOC needs identified by review or querying provider for any TOC needs at discharge.  Htx of CHF/DM/AKI.  Followed by [facility] nephrology.    PCP Dr. Mammie Lorenzo [id number] [place], [place] [employer] [facility] Primary Insurance.  Ins. ID# [id number] Denelda, Akerley (Spouse)  [phone] (Home Phone) AVS Discharge instructions reinforced following Hypoglycemia and Heart Failure:   SPECIAL INSTRUCTIONS AVOID STRAINING STOP ANY ACTIVITY THAT CAUSES CHEST PAIN, SHORTNESS OF BREATH, DIZZINESS, SWEATING, OR EXCESSIVE WEAKNESS.   SPECIAL INSTRUCTIONS FOR PATIENTS WITH HEART FAILURE: Continue to follow the instructions in your Heart Failure Patient Education information that you received during your hospital stay. Record daily weight on same scale at same time of day. If your doctor did not discuss your diet or activity in the information above, please follow a Heart Healthy Low Sodium diet and increase your activity as you feel able. Call your doctor: (Anytime you feel any of the following symptoms) 3-4 pound weight gain in 1-2 days or 2 pounds overnight Shortness of breath, with or without a dry hacking cough Swelling in the hands, feet or stomach If you have to sleep on extra pillows at night in order to breathe    Simmie Davies RN CM 04/11/2021 1021 am. [phone]  Per rounds, may not DC today. DC order still in place pending.  Simmie Davies RN CM 1147 am.   Final next level of care: Home/Self Care   Patient Goals and CMS Choice      Discharge  Placement: Home Self care.                       Discharge Plan and Services    To Do List for follow up appointments in AVS and provider notes.   To Do List         Schedule an appointment with Tawni Levy, NP as soon as possible for a visit in 3 day(s) Specialty: [facility] Discharge F/UP [place] [address]  [place] [address] [phone]         Schedule an appointment with Lowella Bandy, MD as soon as possible for a visit in 1 week(s) Specialty: [facility] Discharge F/UP [facility] [address] [place] [facility]  [place] [address] [phone]                                   Social Determinants of Health (SDOH) Interventions     Readmission Risk Interventions No flowsheet data found.

## 2021-04-12 DIAGNOSIS — R112 Nausea with vomiting, unspecified: Secondary | ICD-10-CM

## 2021-04-12 LAB — BASIC METABOLIC PANEL
Anion gap: 9 (ref 5–15)
BUN: 40 mg/dL — ABNORMAL HIGH (ref 8–23)
CO2: 21 mmol/L — ABNORMAL LOW (ref 22–32)
Calcium: 8.4 mg/dL — ABNORMAL LOW (ref 8.9–10.3)
Chloride: 109 mmol/L (ref 98–111)
Creatinine, Ser: 2.36 mg/dL — ABNORMAL HIGH (ref 0.44–1.00)
GFR, Estimated: 22 mL/min — ABNORMAL LOW (ref >60)
Glucose, Bld: 118 mg/dL — ABNORMAL HIGH (ref 70–99)
Potassium: 4.9 mmol/L (ref 3.5–5.1)
Sodium: 139 mmol/L (ref 135–145)

## 2021-04-12 LAB — GLUCOSE, CAPILLARY
Glucose-Capillary: 104 mg/dL — ABNORMAL HIGH (ref 70–99)
Glucose-Capillary: 114 mg/dL — ABNORMAL HIGH (ref 70–99)
Glucose-Capillary: 152 mg/dL — ABNORMAL HIGH (ref 70–99)
Glucose-Capillary: 155 mg/dL — ABNORMAL HIGH (ref 70–99)
Glucose-Capillary: 176 mg/dL — ABNORMAL HIGH (ref 70–99)
Glucose-Capillary: 179 mg/dL — ABNORMAL HIGH (ref 70–99)
Glucose-Capillary: 192 mg/dL — ABNORMAL HIGH (ref 70–99)

## 2021-04-12 LAB — CBC
HCT: 28.1 % — ABNORMAL LOW (ref 36.0–46.0)
Hemoglobin: 8.7 g/dL — ABNORMAL LOW (ref 12.0–15.0)
MCH: 26.6 pg (ref 26.0–34.0)
MCHC: 31 g/dL (ref 30.0–36.0)
MCV: 85.9 fL (ref 80.0–100.0)
Platelets: 172 10*3/uL (ref 150–400)
RBC: 3.27 MIL/uL — ABNORMAL LOW (ref 3.87–5.11)
RDW: 15 % (ref 11.5–15.5)
WBC: 7.3 10*3/uL (ref 4.0–10.5)
nRBC: 0 % (ref 0.0–0.2)

## 2021-04-12 MED ORDER — PANTOPRAZOLE SODIUM 40 MG PO TBEC
40.0000 mg | DELAYED_RELEASE_TABLET | Freq: Two times a day (BID) | ORAL | Status: DC
  Administered 2021-04-12 – 2021-04-13 (×2): 40 mg via ORAL
  Filled 2021-04-12 (×2): qty 1

## 2021-04-12 MED ORDER — HYDRALAZINE HCL 25 MG PO TABS
25.0000 mg | ORAL_TABLET | Freq: Three times a day (TID) | ORAL | Status: DC
Start: 2021-04-12 — End: 2021-04-13
  Administered 2021-04-12 – 2021-04-13 (×3): 25 mg via ORAL
  Filled 2021-04-12 (×3): qty 1

## 2021-04-12 NOTE — Assessment & Plan Note (Signed)
Patient reports underlying history of CKD 4 (getting work up as an outpt) due to longstanding diabetes followed by [facility] nephrologist.stopping IVF as she is getting more edematous.  Will consult nephrology while here.  Hold off Lasix

## 2021-04-12 NOTE — Assessment & Plan Note (Signed)
Sugars normal without D10

## 2021-04-12 NOTE — Assessment & Plan Note (Signed)
Well compensated for now.  Continue Coreg, statin.

## 2021-04-12 NOTE — Care Management Important Message (Signed)
Important Message  Patient Details  Name: Katrina Ortiz MRN: [id number] Date of Birth: [birth year]   Medicare Important Message Given:  Yes  Asleep during time of visit.  Copy of Medicare IM left in room for reference.   Dannette Barbara 04/12/2021, 10:30 AM

## 2021-04-12 NOTE — Progress Notes (Signed)
Patient had 2 loose, watery BM's this a.m. She was also nauseated. Dr Manuella Ghazi notified and came to see the patient. He did cancel discharge

## 2021-04-12 NOTE — Progress Notes (Signed)
°  Progress Note   Patient: Katrina Ortiz STM:[id number] DOB: July 23, 1955 DOA: 04/09/2021     3 DOS: the patient was seen and examined on 04/12/2021   Brief hospital course: 66 y.o. female with medical history significant for chronic diastolic dysfunction CHF, hypertension, diabetes admitted for hypoxia and hypoglycemia at home.  She was found to have AKI  2/18: Hypoglycemia in hypoxia is now resolved.  Getting IV fluids for AKI 2/19- Nausea, Headache 2/20-persistent nausea, now having loose smelly stools (3 this morning per patient) and 1 episode of vomiting   Assessment and Plan: * AKI (acute kidney injury) ([place])- (present on admission) Patient reports underlying history of CKD 4 (getting work up as an outpt) due to longstanding diabetes followed by [facility] nephrologist.stopping IVF as she is getting more edematous.  Will consult nephrology while here.  Hold off Lasix  Nausea and vomiting Now also reports diarrhea this morning.  She has had 3 loose bowel movement which were smelly according to patient. We will check stool GI panel and consult GI Her KUB and right upper quadrant ultrasound were within normal limit except cholelithiasis Symptomatic management for now  Chronic diastolic CHF (congestive heart failure) ([place])- (present on admission) Well compensated for now.  Continue Coreg, statin.   Hypoglycemia- (present on admission) Sugars normal without D10  Type 2 diabetes mellitus ([place]) Admitted for hypoglycemia.  We will hold off Jardiance and start Lantus 10 units for now.sugars well controlled.        Subjective: Patient just shares that she is not feeling well.  Quite nauseous.  Not able to take much oral nutrition.  Reports 3 loose bowel movement this morning which were very smelly according to patient.  1 episode of vomiting per patient.  Reports pelvic pain  Physical Exam: Vitals:   04/11/21 1617 04/11/21 2024 04/12/21 0417 04/12/21 0805  BP: (!) 170/69 (!) 162/65 (!)  167/64 (!) 161/67  Pulse: 79 78 80 84  Resp: 19 20 20 18   Temp: 98.2 F (36.8 C) 98.3 F (36.8 C) 98.3 F (36.8 C) 98.2 F (36.8 C)  TempSrc: Oral     SpO2: 91% 93% 91% 91%  Weight:      Height:       Constitutional:  Appearance: somewhat edematous mainly in extremities HENT:  Head: Normocephalicand atraumatic.  Nose: Nose normal.  Mouth/Throat:  Mouth: Mucous membranes are moist.  Eyes:  Pupils: Pupils are equal, round, and reactive to light.  Cardiovascular:  Rate and Rhythm: Normal rate.  Pulmonary:  Effort: Pulmonary effort is normal.  Breath sounds: Normal breath sounds.  Abdominal:  General: Abdomen is flat. Bowel sounds arenormal.  Palpations: Generalized minimal pelvic tenderness Musculoskeletal:  General: Normal range of motion.  Cervical back: Normal range of motion.  Skin: General: Skin is warmand dry.  Neurological:  General: No focal deficitpresent.  Mental Status: She is alertand oriented to person, place, and time. Mental status isat baseline.  Psychiatric:  Mood and Affect: Moodnormal.  Behavior: Behaviornormal.  Data Reviewed:  Hemoglobin 8.7.  Creatinine 2.36  Family Communication: None at bedside  Disposition: Status is: Inpatient Remains inpatient appropriate because: Pending nephro and GI consult considering new nausea vomiting and diarrhea symptoms and CKD stage IV      DVT prophylaxis-Lovenox subcu     Planned Discharge Destination: Home     Time spent: 35 minutes  Author: Max Sane, MD 04/12/2021 12:26 PM  For on call review [email or web address].

## 2021-04-12 NOTE — Assessment & Plan Note (Signed)
Admitted for hypoglycemia.  We will hold off Jardiance and start Lantus 10 units for now.sugars well controlled.

## 2021-04-12 NOTE — Consult Note (Addendum)
Inpatient Consultation   Patient ID: Katrina Ortiz is a 66 y.o. female.  Requesting Provider: Max Sane, MD  Date of Admission: 04/09/2021  Date of Consult: 04/12/21   Reason for Consultation:  nausea, diarrhea  Patient's Chief Complaint:   Chief Complaint  Patient presents with   Hypoglycemia    66 year old Caucasian female with history of HFpEF, HTN, DM2 who was brought in to the hospital after being found down.  Gastroenterology is consulted for nausea vomiting and diarrhea.  In speaking with the patient her nausea and diarrhea started today.  Nausea has been improved with Zofran and and has tolerated some lunch specifically grilled cheese.  She did have 3 loose bowel movements (yellow in color) this morning without melena or hematochezia.  With each BM, there was less output. Prior to her hospitalization she only had 1 loose bowel movement on Thursday, but had otherwise been in her normal state of health again denying any blood in the stool.  She denies any vomiting.  Has mild abdominal discomfort that has also improved. No sick contacts or abx prior to hospitalization.  Patient is on metformin at home, but gallbladder intact.  She denies any new medications or changes in dosing.  Denies any NSAIDs.  She does take opioids at home, but infrequently per the patient.  On duloxetine and PPI at home.  Also on Plavix.  Patient initially presented with blood glucose of 28, hypothermic and was hypoxic at 75% SPO2 this improved with D10 and oxygen supplementation.  She suffered an AKI with presenting creatinine of 3.18 from baseline 0.74.  Today is 2.36 with IV fluid resuscitation.  History of chronic anemia and iron deficiency for which she is on supplementation.  Hemoglobin today is 8.7.  Again, no overt signs of GI bleeding.  Right upper quadrant ultrasound demonstrating cholelithiasis with no other signs of gallbladder disease.  Stones within the lumen.  No intra or extrahepatic biliary  dilation.  Normal portal vein Doppler.  KUB was also performed and was normal.  GI stool PCR currently pending  Does have diabetic neuropathy, but denies early satiety and gastroparesis sx  Patient denies coffee ground emesis, hematemesis, constipation, melena, hematochezia, fever, chills, dysphagia, odynophagia, jaundice. No abnormal weight loss or night sweats  Denies NSAIDs and anticoagulants Denies family history of gastrointestinal disease and malignancy Previous Endoscopies: Remote EGD; Colonoscopy- ~10 years ago    Past Medical History:  Diagnosis Date   Diabetes mellitus without complication ([place])    Hypertension    Psoriasis     Past Surgical History:  Procedure Laterality Date   CESAREAN SECTION     2 c-sections   CORONARY ARTERY BYPASS GRAFT     CORONARY STENT PLACEMENT     HERNIA REPAIR     TUBAL LIGATION      No Known Allergies  History reviewed. No pertinent family history.  Social History   Tobacco Use   Smoking status: Former    Types: Cigarettes    Quit date: 11/22/2002    Years since quitting: 18.4   Smokeless tobacco: Never  Substance Use Topics   Alcohol use: No   Drug use: No     Pertinent GI related history and allergies were reviewed with the patient  Review of Systems  Constitutional:  Positive for appetite change and fatigue. Negative for activity change, chills, diaphoresis, fever and unexpected weight change.  HENT:  Negative for trouble swallowing and voice change.   Respiratory:  Negative for shortness  of breath and wheezing.   Cardiovascular:  Negative for chest pain, palpitations and leg swelling.  Gastrointestinal:  Positive for abdominal pain (mild), diarrhea and nausea. Negative for abdominal distention, anal bleeding, blood in stool, constipation, rectal pain and vomiting.  Musculoskeletal:  Negative for arthralgias and myalgias.  Skin:  Negative for color change and pallor.  Neurological:  Positive for syncope. Negative for  dizziness and weakness.  Psychiatric/Behavioral:  Negative for confusion.   All other systems reviewed and are negative.   Medications Home Medications No current facility-administered medications on file prior to encounter.   Current Outpatient Medications on File Prior to Encounter  Medication Sig Dispense Refill   amLODipine (NORVASC) 10 MG tablet Take 10 mg by mouth daily.     carvedilol (COREG) 25 MG tablet Take 25 mg by mouth 2 (two) times daily.     clopidogrel (PLAVIX) 75 MG tablet Take 75 mg by mouth daily.     DULoxetine (CYMBALTA) 60 MG capsule Take 60 mg by mouth daily.     furosemide (LASIX) 40 MG tablet Take 40 mg by mouth 2 (two) times daily.     gabapentin (NEURONTIN) 300 MG capsule Take 300 mg by mouth at bedtime.     JARDIANCE 10 MG TABS tablet Take 10 mg by mouth every morning.     pravastatin (PRAVACHOL) 80 MG tablet Take 80 mg by mouth daily.     valsartan (DIOVAN) 40 MG tablet Take 40 mg by mouth daily.     omeprazole (PRILOSEC) 20 MG capsule Take 20 mg by mouth daily at 12 noon.     Pertinent GI related medications were reviewed with the patient  Inpatient Medications  Current Facility-Administered Medications:    acetaminophen (TYLENOL) tablet 650 mg, 650 mg, Oral, Q6H PRN, 650 mg at 04/10/21 2357 **OR** acetaminophen (TYLENOL) suppository 650 mg, 650 mg, Rectal, Q6H PRN, Agbata, Tochukwu, MD   alum & mag hydroxide-simeth (MAALOX/MYLANTA) 200-200-20 MG/5ML suspension 30 mL, 30 mL, Oral, Q6H PRN, Max Sane, MD, 30 mL at 04/12/21 0915   carvedilol (COREG) tablet 25 mg, 25 mg, Oral, BID, Agbata, Tochukwu, MD, 25 mg at 04/12/21 4268   clopidogrel (PLAVIX) tablet 75 mg, 75 mg, Oral, Daily, Agbata, Tochukwu, MD, 75 mg at 04/12/21 0917   DULoxetine (CYMBALTA) DR capsule 60 mg, 60 mg, Oral, Daily, Agbata, Tochukwu, MD, 60 mg at 04/12/21 0917   enoxaparin (LOVENOX) injection 30 mg, 30 mg, Subcutaneous, Q24H, Agbata, Tochukwu, MD, 30 mg at 04/11/21 1709   insulin  glargine-yfgn (SEMGLEE) injection 10 Units, 10 Units, Subcutaneous, Daily, Max Sane, MD, 10 Units at 04/12/21 0915   ondansetron (ZOFRAN) tablet 4 mg, 4 mg, Oral, Q6H PRN, 4 mg at 04/11/21 0851 **OR** ondansetron (ZOFRAN) injection 4 mg, 4 mg, Intravenous, Q6H PRN, Agbata, Tochukwu, MD, 4 mg at 04/12/21 1051   pravastatin (PRAVACHOL) tablet 80 mg, 80 mg, Oral, Daily, Agbata, Tochukwu, MD, 80 mg at 04/12/21 0917   acetaminophen **OR** acetaminophen, alum & mag hydroxide-simeth, ondansetron **OR** ondansetron (ZOFRAN) IV   Objective   Vitals:   04/11/21 1617 04/11/21 2024 04/12/21 0417 04/12/21 0805  BP: (!) 170/69 (!) 162/65 (!) 167/64 (!) 161/67  Pulse: 79 78 80 84  Resp: 19 20 20 18   Temp: 98.2 F (36.8 C) 98.3 F (36.8 C) 98.3 F (36.8 C) 98.2 F (36.8 C)  TempSrc: Oral     SpO2: 91% 93% 91% 91%  Weight:      Height:  Physical Exam Vitals and nursing note reviewed.  Constitutional:      General: She is not in acute distress.    Appearance: Normal appearance. She is obese. She is not ill-appearing, toxic-appearing or diaphoretic.  HENT:     Head: Normocephalic and atraumatic.     Nose: Nose normal.     Mouth/Throat:     Mouth: Mucous membranes are moist.     Pharynx: Oropharynx is clear.  Eyes:     General: No scleral icterus.    Extraocular Movements: Extraocular movements intact.  Cardiovascular:     Rate and Rhythm: Normal rate and regular rhythm.     Heart sounds: Normal heart sounds. No murmur heard.   No friction rub. No gallop.  Pulmonary:     Effort: Pulmonary effort is normal. No respiratory distress.     Breath sounds: Normal breath sounds. No wheezing, rhonchi or rales.  Abdominal:     General: Bowel sounds are normal. There is no distension.     Palpations: Abdomen is soft.     Tenderness: There is no abdominal tenderness. There is no guarding or rebound.  Musculoskeletal:     Cervical back: Neck supple.  Skin:    General: Skin is warm and  dry.     Coloration: Skin is not jaundiced or pale.  Neurological:     General: No focal deficit present.     Mental Status: She is alert and oriented to person, place, and time. Mental status is at baseline.  Psychiatric:        Mood and Affect: Mood normal.        Behavior: Behavior normal.        Thought Content: Thought content normal.        Judgment: Judgment normal.    Laboratory Data Recent Labs  Lab 04/10/21 0653 04/11/21 0640 04/12/21 0449  WBC 7.3 7.4 7.3  HGB 8.3* 8.2* 8.7*  HCT 25.8* 26.2* 28.1*  PLT 190 163 172   Recent Labs  Lab 04/10/21 0653 04/11/21 0640 04/12/21 0449  NA 140 140 139  K 4.9 5.1 4.9  CL 113* 112* 109  CO2 18* 19* 21*  BUN 47* 41* 40*  CALCIUM 7.5* 8.1* 8.4*  GLUCOSE 216* 124* 118*   No results for input(s): INR in the last 168 hours.  No results for input(s): LIPASE in the last 72 hours.      Imaging Studies: DG Abd 1 View  Result Date: 04/11/2021 CLINICAL DATA:  Hypoxia and hypoglycemia.  Nausea. EXAM: ABDOMEN - 1 VIEW COMPARISON:  None. FINDINGS: Bowel gas pattern is normal. No evidence of ileus or obstruction. No abnormal calcifications. Tubal ligation clips in the pelvis. Chronic degenerative changes of the lumbar spine. IMPRESSION: No acute or significant finding. Electronically Signed   By: Nelson Chimes M.D.   On: 04/11/2021 15:55   US ABDOMEN LIMITED RUQ (LIVER/GB)  Result Date: 04/11/2021 CLINICAL DATA:  66 year old female with nausea and vomiting for 1 day EXAM: ULTRASOUND ABDOMEN LIMITED RIGHT UPPER QUADRANT COMPARISON:  02/27/2019 CT. FINDINGS: Gallbladder: A few mobile gallstones are noted, the largest measuring 5 mm. There is no evidence of gallbladder wall thickening, pericholecystic fluid or sonographic Murphy sign. Common bile duct: Diameter: 3.2 mm. No intrahepatic or extrahepatic biliary dilatation identified. Liver: No focal lesion identified. Within normal limits in parenchymal echogenicity. Portal vein is patent on  color Doppler imaging with normal direction of blood flow towards the liver. Other: None. IMPRESSION: 1. Cholelithiasis without evidence of  acute cholecystitis. 2. No other significant abnormalities.  No biliary dilatation. Electronically Signed   By: Margarette Canada M.D.   On: 04/11/2021 18:11    Assessment:   # Acute diarrhea- seemingly self limited - 3 loose stools today; had 1 loose stool Thursday and none in between. Previously normal prior to Thursday - GI PCR negative - No recent abx, leukocytosis improving, no abdominal tenderness - tolerating some po - acute gastroenteritis of consideration  # Nausea w/o vomiting - improving with zofran - tolerated lunch today - gastroparesis of consideration, but was not having many issues prior to last Thursday - unlikely poor po intake as contributor to her acute presentation as pt reports she was in her normal state of health and appetite up until Thursday/her hospitalization - no other signs of GOO  # Found down/syncope # hypoglycemia # AKI # asx cholelithiasis - no IH/EH dilation  # chronic iron deficiency anemia # HFpEF # cad on plavix s/p stents and bypass  Plan:  -Supportive care at this time- ivf, anti emetics as per primary team  - currently improving on zofran. If having increased sx consider additional phenergan or compazine -GI PCR awaiting collection- if negative, can consider adding loperamide if diarrhea continues -PO as tolerated -Labs and [place], and KUB reviewed - metformin begin held  -Do not perform Stool occult blood test as it has no role in evaluation of anemia and is a test for colon cancer screening and is inappropriate to be used for evaluation of anemia/gastrointestinal bleeding, as a negative or positive test would not change evaluation. -Continue iron supplementation - resume patient's home ppi -No acute endoscopic intervention indicated. Recommend follow up out patient for pan endoscopy as pt is due for screening  colon  I personally performed the service.  Management of other medical comorbidities as per primary team  Thank you for allowing [place] to participate in this patient's care. Please don't hesitate to call if any questions or concerns arise.   Annamaria Helling, DO [facility]  Portions of the record may have been created with voice recognition software. Occasional wrong-word or 'sound-a-like' substitutions may have occurred due to the inherent limitations of voice recognition software.  Read the chart carefully and recognize, using context, where substitutions may have occurred.

## 2021-04-13 ENCOUNTER — Other Ambulatory Visit: Payer: Self-pay

## 2021-04-13 ENCOUNTER — Emergency Department: Payer: HMO

## 2021-04-13 ENCOUNTER — Encounter: Payer: Self-pay | Admitting: Emergency Medicine

## 2021-04-13 DIAGNOSIS — L03114 Cellulitis of left upper limb: Principal | ICD-10-CM | POA: Diagnosis present

## 2021-04-13 DIAGNOSIS — I130 Hypertensive heart and chronic kidney disease with heart failure and stage 1 through stage 4 chronic kidney disease, or unspecified chronic kidney disease: Secondary | ICD-10-CM | POA: Diagnosis present

## 2021-04-13 DIAGNOSIS — A09 Infectious gastroenteritis and colitis, unspecified: Secondary | ICD-10-CM | POA: Diagnosis present

## 2021-04-13 DIAGNOSIS — Z87891 Personal history of nicotine dependence: Secondary | ICD-10-CM

## 2021-04-13 DIAGNOSIS — I808 Phlebitis and thrombophlebitis of other sites: Secondary | ICD-10-CM | POA: Diagnosis present

## 2021-04-13 DIAGNOSIS — L03113 Cellulitis of right upper limb: Secondary | ICD-10-CM | POA: Diagnosis present

## 2021-04-13 DIAGNOSIS — R197 Diarrhea, unspecified: Secondary | ICD-10-CM | POA: Diagnosis not present

## 2021-04-13 DIAGNOSIS — I82611 Acute embolism and thrombosis of superficial veins of right upper extremity: Secondary | ICD-10-CM | POA: Diagnosis present

## 2021-04-13 DIAGNOSIS — Z79899 Other long term (current) drug therapy: Secondary | ICD-10-CM

## 2021-04-13 DIAGNOSIS — Z20822 Contact with and (suspected) exposure to covid-19: Secondary | ICD-10-CM | POA: Diagnosis present

## 2021-04-13 DIAGNOSIS — N390 Urinary tract infection, site not specified: Secondary | ICD-10-CM | POA: Diagnosis present

## 2021-04-13 DIAGNOSIS — I2510 Atherosclerotic heart disease of native coronary artery without angina pectoris: Secondary | ICD-10-CM | POA: Diagnosis present

## 2021-04-13 DIAGNOSIS — E875 Hyperkalemia: Secondary | ICD-10-CM | POA: Diagnosis present

## 2021-04-13 DIAGNOSIS — Z951 Presence of aortocoronary bypass graft: Secondary | ICD-10-CM

## 2021-04-13 DIAGNOSIS — Z6837 Body mass index (BMI) 37.0-37.9, adult: Secondary | ICD-10-CM

## 2021-04-13 DIAGNOSIS — B9620 Unspecified Escherichia coli [E. coli] as the cause of diseases classified elsewhere: Secondary | ICD-10-CM | POA: Diagnosis present

## 2021-04-13 DIAGNOSIS — E669 Obesity, unspecified: Secondary | ICD-10-CM | POA: Diagnosis present

## 2021-04-13 DIAGNOSIS — D72829 Elevated white blood cell count, unspecified: Secondary | ICD-10-CM | POA: Diagnosis present

## 2021-04-13 DIAGNOSIS — Z955 Presence of coronary angioplasty implant and graft: Secondary | ICD-10-CM

## 2021-04-13 DIAGNOSIS — N179 Acute kidney failure, unspecified: Secondary | ICD-10-CM | POA: Diagnosis present

## 2021-04-13 DIAGNOSIS — N184 Chronic kidney disease, stage 4 (severe): Secondary | ICD-10-CM | POA: Diagnosis present

## 2021-04-13 DIAGNOSIS — Z7902 Long term (current) use of antithrombotics/antiplatelets: Secondary | ICD-10-CM

## 2021-04-13 DIAGNOSIS — D631 Anemia in chronic kidney disease: Secondary | ICD-10-CM | POA: Diagnosis present

## 2021-04-13 DIAGNOSIS — I5033 Acute on chronic diastolic (congestive) heart failure: Secondary | ICD-10-CM | POA: Diagnosis present

## 2021-04-13 DIAGNOSIS — E1165 Type 2 diabetes mellitus with hyperglycemia: Secondary | ICD-10-CM | POA: Diagnosis present

## 2021-04-13 DIAGNOSIS — L409 Psoriasis, unspecified: Secondary | ICD-10-CM | POA: Diagnosis present

## 2021-04-13 DIAGNOSIS — T380X5A Adverse effect of glucocorticoids and synthetic analogues, initial encounter: Secondary | ICD-10-CM | POA: Diagnosis present

## 2021-04-13 DIAGNOSIS — M19032 Primary osteoarthritis, left wrist: Secondary | ICD-10-CM | POA: Diagnosis present

## 2021-04-13 DIAGNOSIS — Z9851 Tubal ligation status: Secondary | ICD-10-CM

## 2021-04-13 DIAGNOSIS — Z794 Long term (current) use of insulin: Secondary | ICD-10-CM

## 2021-04-13 DIAGNOSIS — E1122 Type 2 diabetes mellitus with diabetic chronic kidney disease: Secondary | ICD-10-CM | POA: Diagnosis present

## 2021-04-13 LAB — GLUCOSE, CAPILLARY
Glucose-Capillary: 136 mg/dL — ABNORMAL HIGH (ref 70–99)
Glucose-Capillary: 141 mg/dL — ABNORMAL HIGH (ref 70–99)

## 2021-04-13 LAB — CBG MONITORING, ED: Glucose-Capillary: 189 mg/dL — ABNORMAL HIGH (ref 70–99)

## 2021-04-13 NOTE — Consult Note (Signed)
[facility]  CONSULT NOTE    Date: 04/13/2021                  Patient Name:  Katrina Ortiz  MRN: [id number]  DOB: 08-21-1955  Age / Sex: 66 y.o., female         PCP: Lowella Bandy, MD                 Service Requesting Consult: [facility]                 Reason for Consult: Acute kidney injury            History of Present Illness: Katrina Ortiz is a 66 y.o.  female with past medical history of hypertension, diastolic heart failure and diabetes, who was admitted to [facility] on 04/09/2021 for SOB (shortness of breath) [R06.02] Hypoglycemia [E16.2] AKI (acute kidney injury) ([place]) [N17.9] Hypothermia, initial encounter [T68.XXXA]  Patient presented to the emergency department via EMS after being found unresponsive by sister in her home.  Patient's blood glucose reportedly 28.  She was also found to be hypoxic with oxygen saturation of 75%.  Patient states she has been diabetic for almost 20 years.  Does not report prior sickness or illness preceding this event.  Denies NSAID use.  Does report family history of renal carcinoma with mother.  She currently follows [facility] nephrology outpatient.  States she has had poor appetite and diarrhea this admission.  Denies shortness of breath or cough.  Denies fever.  Denies chest pain or other discomfort.  On ED admission creatinine 3.8 with GFR 16.  Creatinine has improved to 2.36 today with GFR 22.  Electrolytes stable.  Though potassium elevated early in admission.  Chest x-ray shows small left pleural effusion while CT chest shows bilateral pleural effusions with mild dependent pulmonary atelectasis suggesting infectious bronchopneumonia.  Abdominal x-ray shows no acute findings.  Abdominal ultrasound negative for concerning conditions.  Respiratory panel negative for influenza and COVID-19.    Medications: Outpatient medications: No medications prior to admission.    Current medications: Current Facility-Administered  Medications  Medication Dose Route Frequency Provider Last Rate Last Admin   acetaminophen (TYLENOL) tablet 650 mg  650 mg Oral Q6H PRN Agbata, Tochukwu, MD   650 mg at 04/10/21 2357   Or   acetaminophen (TYLENOL) suppository 650 mg  650 mg Rectal Q6H PRN Agbata, Tochukwu, MD       alum & mag hydroxide-simeth (MAALOX/MYLANTA) 200-200-20 MG/5ML suspension 30 mL  30 mL Oral Q6H PRN Max Sane, MD   30 mL at 04/12/21 0915   carvedilol (COREG) tablet 25 mg  25 mg Oral BID Agbata, Tochukwu, MD   25 mg at 04/13/21 0109   clopidogrel (PLAVIX) tablet 75 mg  75 mg Oral Daily Agbata, Tochukwu, MD   75 mg at 04/13/21 0837   DULoxetine (CYMBALTA) DR capsule 60 mg  60 mg Oral Daily Agbata, Tochukwu, MD   60 mg at 04/13/21 0837   enoxaparin (LOVENOX) injection 30 mg  30 mg Subcutaneous Q24H Agbata, Tochukwu, MD   30 mg at 04/12/21 1700   hydrALAZINE (APRESOLINE) tablet 25 mg  25 mg Oral Q8H Manuella Ghazi, Vipul, MD   25 mg at 04/13/21 0511   insulin glargine-yfgn (SEMGLEE) injection 10 Units  10 Units Subcutaneous Daily Max Sane, MD   10 Units at 04/13/21 0837   ondansetron (ZOFRAN) tablet 4 mg  4 mg Oral Q6H PRN Collier Bullock, MD  4 mg at 04/13/21 1057   Or   ondansetron (ZOFRAN) injection 4 mg  4 mg Intravenous Q6H PRN Agbata, Tochukwu, MD   4 mg at 04/12/21 1051   pantoprazole (PROTONIX) EC tablet 40 mg  40 mg Oral BID AC Shah, Vipul, MD   40 mg at 04/13/21 0837   pravastatin (PRAVACHOL) tablet 80 mg  80 mg Oral Daily Agbata, Tochukwu, MD   80 mg at 04/13/21 5009   Current Outpatient Medications  Medication Sig Dispense Refill   amLODipine (NORVASC) 10 MG tablet Take 10 mg by mouth daily.     carvedilol (COREG) 25 MG tablet Take 25 mg by mouth 2 (two) times daily.     clopidogrel (PLAVIX) 75 MG tablet Take 75 mg by mouth daily.     DULoxetine (CYMBALTA) 60 MG capsule Take 60 mg by mouth daily.     furosemide (LASIX) 40 MG tablet Take 40 mg by mouth 2 (two) times daily.     gabapentin (NEURONTIN) 300 MG  capsule Take 300 mg by mouth at bedtime.     JARDIANCE 10 MG TABS tablet Take 10 mg by mouth every morning.     pravastatin (PRAVACHOL) 80 MG tablet Take 80 mg by mouth daily.     valsartan (DIOVAN) 40 MG tablet Take 40 mg by mouth daily.     LANTUS SOLOSTAR 100 UNIT/ML Solostar Pen Inject 10 Units into the skin at bedtime. Hold if sugar < 100 15 mL 11   omeprazole (PRILOSEC) 20 MG capsule Take 20 mg by mouth daily at 12 noon.        Allergies: No Known Allergies    Past Medical History: Past Medical History:  Diagnosis Date   Diabetes mellitus without complication ([place])    Hypertension    Psoriasis      Past Surgical History: Past Surgical History:  Procedure Laterality Date   CESAREAN SECTION     2 c-sections   CORONARY ARTERY BYPASS GRAFT     CORONARY STENT PLACEMENT     HERNIA REPAIR     TUBAL LIGATION       Family History: History reviewed. No pertinent family history.   Social History: Social History   Socioeconomic History   Marital status: Married    Spouse name: Not on file   Number of children: Not on file   Years of education: Not on file   Highest education level: Not on file  Occupational History   Not on file  Tobacco Use   Smoking status: Former    Types: Cigarettes    Quit date: 11/22/2002    Years since quitting: 18.4   Smokeless tobacco: Never  Substance and Sexual Activity   Alcohol use: No   Drug use: No   Sexual activity: Not on file  Other Topics Concern   Not on file  Social History Narrative   Not on file   Social Determinants of Health   Financial Resource Strain: Not on file  Food Insecurity: Not on file  Transportation Needs: Not on file  Physical Activity: Not on file  Stress: Not on file  Social Connections: Not on file  Intimate Partner Violence: Not on file     Review of Systems: Review of Systems  Constitutional:  Negative for chills, fever and malaise/fatigue.  HENT:  Negative for congestion, sore throat  and tinnitus.   Eyes:  Negative for blurred vision and redness.  Respiratory:  Negative for cough, shortness of breath and wheezing.  Cardiovascular:  Negative for chest pain, palpitations, claudication and leg swelling.  Gastrointestinal:  Negative for abdominal pain, blood in stool, diarrhea, nausea and vomiting.  Genitourinary:  Negative for flank pain, frequency and hematuria.  Musculoskeletal:  Negative for back pain, falls and myalgias.  Skin:  Negative for rash.  Neurological:  Positive for loss of consciousness. Negative for dizziness, weakness and headaches.  Endo/Heme/Allergies:  Does not bruise/bleed easily.  Psychiatric/Behavioral:  Negative for depression. The patient is not nervous/anxious and does not have insomnia.    Vital Signs: Blood pressure (!) 155/57, pulse 84, temperature 98.8 F (37.1 C), resp. rate 18, height 5\' 3"  (1.6 m), weight 94.3 kg, SpO2 92 %.  Weight trends: Filed Weights   04/09/21 1330  Weight: 94.3 kg    Physical Exam: General: NAD,   Head: Normocephalic, atraumatic. Moist oral mucosal membranes  Eyes: Anicteric  Lungs:  Clear to auscultation, normal effort  Heart: Regular rate and rhythm  Abdomen:  Soft, nontender  Extremities: No peripheral edema.  Neurologic: Nonfocal, moving all four extremities  Skin: No lesions        Lab results: Basic Metabolic Panel: Recent Labs  Lab 04/10/21 0653 04/11/21 0640 04/12/21 0449  NA 140 140 139  K 4.9 5.1 4.9  CL 113* 112* 109  CO2 18* 19* 21*  GLUCOSE 216* 124* 118*  BUN 47* 41* 40*  CREATININE 2.69* 2.43* 2.36*  CALCIUM 7.5* 8.1* 8.4*    Liver Function Tests: No results for input(s): AST, ALT, ALKPHOS, BILITOT, PROT, ALBUMIN in the last 168 hours. No results for input(s): LIPASE, AMYLASE in the last 168 hours. No results for input(s): AMMONIA in the last 168 hours.  CBC: Recent Labs  Lab 04/09/21 1328 04/10/21 0653 04/11/21 0640 04/12/21 0449  WBC 7.4 7.3 7.4 7.3  NEUTROABS  6.3  --   --   --   HGB 10.0* 8.3* 8.2* 8.7*  HCT 33.1* 25.8* 26.2* 28.1*  MCV 87.8 86.9 86.2 85.9  PLT 199 190 163 172    Cardiac Enzymes: No results for input(s): CKTOTAL, CKMB, CKMBINDEX, TROPONINI in the last 168 hours.  BNP: Invalid input(s): POCBNP  CBG: Recent Labs  Lab 04/12/21 1636 04/12/21 2022 04/12/21 2354 04/13/21 0340 04/13/21 0755  GLUCAP 176* 192* 152* 136* 141*    Microbiology: Results for orders placed or performed during the hospital encounter of 04/10/19  Urine Culture     Status: None   Collection Time: 04/10/19  9:44 PM   Specimen: Urine, Clean Catch  Result Value Ref Range Status   Specimen Description   Final    URINE, CLEAN CATCH Performed at [facility], [address] [place], [place] [address]    Special Requests   Final    NONE Performed at [facility], [address] [place], [place] [address]    Culture   Final    NO GROWTH Performed at [facility], [place] [address] [place], [place] [address]    Report Status 04/11/2019 FINAL  Final  Blood culture (routine x 2)     Status: None   Collection Time: 04/10/19  9:48 PM   Specimen: BLOOD RIGHT FOREARM  Result Value Ref Range Status   Specimen Description BLOOD RIGHT FOREARM  Final   Special Requests Blood Culture adequate volume  Final   Culture   Final    NO GROWTH 5 DAYS Performed at [facility], [address], [place], [place] [address]    Report  Status 04/15/2019 FINAL  Final  Blood culture (routine x 2)     Status: None   Collection Time: 04/10/19  9:48 PM   Specimen: Left Antecubital; Blood  Result Value Ref Range Status   Specimen Description LEFT ANTECUBITAL  Final   Special Requests Blood Culture adequate volume  Final   Culture   Final    NO GROWTH 5 DAYS Performed at [name], [address] [place], [place] [address]    Report Status 04/15/2019 FINAL  Final  Respiratory Panel by RT PCR (Flu A&B, Covid) -  Nasopharyngeal Swab     Status: None   Collection Time: 04/10/19 11:29 PM   Specimen: Nasopharyngeal Swab  Result Value Ref Range Status   SARS Coronavirus 2 by RT PCR NEGATIVE NEGATIVE Final    Comment: (NOTE) SARS-CoV-2 target nucleic acids are NOT DETECTED. The SARS-CoV-2 RNA is generally detectable in upper respiratoy specimens during the acute phase of infection. The lowest concentration of SARS-CoV-2 viral copies this assay can detect is 131 copies/mL. A negative result does not preclude SARS-Cov-2 infection and should not be used as the sole basis for treatment or other patient management decisions. A negative result may occur with  improper specimen collection/handling, submission of specimen other than nasopharyngeal swab, presence of viral mutation(s) within the areas targeted by this assay, and inadequate number of viral copies (<131 copies/mL). A negative result must be combined with clinical observations, patient history, and epidemiological information. The expected result is Negative. Fact Sheet for Patients:  PinkCheek.be Fact Sheet for Healthcare Providers:  GravelBags.it This test is not yet ap proved or cleared by the [place] FDA and  has been authorized for detection and/or diagnosis of SARS-CoV-2 by FDA under an Emergency Use Authorization (EUA). This EUA will remain  in effect (meaning this test can be used) for the duration of the COVID-19 declaration under Section 564(b)(1) of the Act, 21 U.S.C. section 360bbb-3(b)(1), unless the authorization is terminated or revoked sooner.    Influenza A by PCR NEGATIVE NEGATIVE Final   Influenza B by PCR NEGATIVE NEGATIVE Final    Comment: (NOTE) The Xpert Xpress SARS-CoV-2/FLU/RSV assay is intended as an aid in  the diagnosis of influenza from Nasopharyngeal swab specimens and  should not be used as a sole basis for treatment. Nasal washings and  aspirates  are unacceptable for Xpert Xpress SARS-CoV-2/FLU/RSV  testing. Fact Sheet for Patients: PinkCheek.be Fact Sheet for Healthcare Providers: GravelBags.it This test is not yet approved or cleared by the [place] FDA and  has been authorized for detection and/or diagnosis of SARS-CoV-2 by  FDA under an Emergency Use Authorization (EUA). This EUA will remain  in effect (meaning this test can be used) for the duration of the  Covid-19 declaration under Section 564(b)(1) of the Act, 21  U.S.C. section 360bbb-3(b)(1), unless the authorization is  terminated or revoked. Performed at [facility], [address] [place], [place] [address]   MRSA PCR Screening     Status: None   Collection Time: 04/11/19  2:13 AM   Specimen: Nasal Mucosa; Nasopharyngeal  Result Value Ref Range Status   MRSA by PCR NEGATIVE NEGATIVE Final    Comment:        The GeneXpert MRSA Assay (FDA approved for NASAL specimens only), is one component of a comprehensive MRSA colonization surveillance program. It is not intended to diagnose MRSA infection nor to guide or monitor treatment for MRSA infections. Performed at [facility],  [place], [place] [address]   Culture, respiratory     Status: None   Collection Time: 04/11/19 11:44 AM   Specimen: Tracheal Aspirate  Result Value Ref Range Status   Specimen Description   Final    TRACHEAL ASPIRATE Performed at [facility], [address], [place], [place] [address]    Special Requests   Final    NONE Performed at [facility], [place], [place] [address]    Gram Stain   Final    ABUNDANT WBC PRESENT,BOTH PMN AND MONONUCLEAR ABUNDANT GRAM POSITIVE COCCI FEW GRAM NEGATIVE RODS RARE GRAM VARIABLE ROD    Culture   Final    Consistent with normal respiratory flora. Performed at [facility], [place] [address],  [place],  [address]    Report Status 04/13/2019 FINAL  Final    Coagulation Studies: No results for input(s): LABPROT, INR in the last 72 hours.  Urinalysis: No results for input(s): COLORURINE, LABSPEC, PHURINE, GLUCOSEU, HGBUR, BILIRUBINUR, KETONESUR, PROTEINUR, UROBILINOGEN, NITRITE, LEUKOCYTESUR in the last 72 hours.  Invalid input(s): APPERANCEUR    Imaging: DG Abd 1 View  Result Date: 04/11/2021 CLINICAL DATA:  Hypoxia and hypoglycemia.  Nausea. EXAM: ABDOMEN - 1 VIEW COMPARISON:  None. FINDINGS: Bowel gas pattern is normal. No evidence of ileus or obstruction. No abnormal calcifications. Tubal ligation clips in the pelvis. Chronic degenerative changes of the lumbar spine. IMPRESSION: No acute or significant finding. Electronically Signed   By: Nelson Chimes M.D.   On: 04/11/2021 15:55   US ABDOMEN LIMITED RUQ (LIVER/GB)  Result Date: 04/11/2021 CLINICAL DATA:  66 year old female with nausea and vomiting for 1 day EXAM: ULTRASOUND ABDOMEN LIMITED RIGHT UPPER QUADRANT COMPARISON:  02/27/2019 CT. FINDINGS: Gallbladder: A few mobile gallstones are noted, the largest measuring 5 mm. There is no evidence of gallbladder wall thickening, pericholecystic fluid or sonographic Murphy sign. Common bile duct: Diameter: 3.2 mm. No intrahepatic or extrahepatic biliary dilatation identified. Liver: No focal lesion identified. Within normal limits in parenchymal echogenicity. Portal vein is patent on color Doppler imaging with normal direction of blood flow towards the liver. Other: None. IMPRESSION: 1. Cholelithiasis without evidence of acute cholecystitis. 2. No other significant abnormalities.  No biliary dilatation. Electronically Signed   By: Margarette Canada M.D.   On: 04/11/2021 18:11     Assessment & Plan: Katrina Ortiz is a 66 y.o.  female with past medical history of hypertension, diastolic heart failure and diabetes,, who was admitted to [facility] on 04/09/2021 for SOB (shortness of breath)  [R06.02] Hypoglycemia [E16.2] AKI (acute kidney injury) ([place]) [N17.9] Hypothermia, initial encounter [T68.XXXA]   Acute kidney injury with chronic kidney disease stage IV.  Baseline creatinine appears to be 2.65 with GFR 19 on 03/03/2021.  Acute kidney injury appears to be multifactorial from dehydration prior to admission.  Risk factors include below history of diabetes and hypertension.  Currently followed outpatient by [facility] nephrology.  No acute indication for dialysis at this time.  No IV contrast exposure.  Creatinine currently 2.36 with 900 mL of urine recorded overnight.  Currently tolerating meals and reports decreased diarrhea.  Continue to avoid nephrotoxic agents and therapies.  Will recommend patient follow-up with [facility] nephrology at discharge.  2. Diabetes mellitus type II with chronic kidney disease  insulin  dependent. Home regimen includes Lantus. Most recent hemoglobin A1c is 6.4 on 01/27/21.  [place]  Hypoglycemic on admission at 38.  Glucose stable since that time  3.  Chronic diastolic heart failure.  Echo on 04/11/2019 shows EF 60 to 65% with a grade 1 diastolic dysfunction.  LOS: [place] 2/21/20232:45 PM

## 2021-04-13 NOTE — Progress Notes (Signed)
Patient discharged with all belongings via self/private transfer to home. Patient received discharge instructions with no questions noted. VSS at discharge. Patient medications sent to [facility] [place] per patient request. [id number] Removed with cathter intact. No questions or concerns from patient

## 2021-04-13 NOTE — ED Triage Notes (Addendum)
Pt arrived via [facility] from home with reports of dizziness and weakness, pt was recently discharged from the hospital this morning where she was admitted since Friday.  Pt also c/o L arm pain that started after discharge.  Pt has hx of COPD, was 86% on RA with EMS, was placed on 2L with sats up to 95%.  Pt states she does not wear oxygen at home, but does have a tank at home.  PT also c/o L wrist pain, states she may have fallen on it but has not had any falls in the last couple of weeks.  Pt also has redness noted to R AC where IV site was, area is warm to the touch.

## 2021-04-13 NOTE — Plan of Care (Signed)

## 2021-04-14 ENCOUNTER — Emergency Department: Payer: HMO

## 2021-04-14 ENCOUNTER — Inpatient Hospital Stay
Admission: EM | Admit: 2021-04-14 | Discharge: 2021-04-20 | DRG: 602 | Disposition: A | Discharge status: Skilled Nursing Facility | Payer: HMO | Attending: Internal Medicine | Admitting: Internal Medicine

## 2021-04-14 DIAGNOSIS — E1165 Type 2 diabetes mellitus with hyperglycemia: Secondary | ICD-10-CM | POA: Diagnosis present

## 2021-04-14 DIAGNOSIS — M25532 Pain in left wrist: Secondary | ICD-10-CM

## 2021-04-14 DIAGNOSIS — D631 Anemia in chronic kidney disease: Secondary | ICD-10-CM | POA: Diagnosis present

## 2021-04-14 DIAGNOSIS — E119 Type 2 diabetes mellitus without complications: Secondary | ICD-10-CM

## 2021-04-14 DIAGNOSIS — Z951 Presence of aortocoronary bypass graft: Secondary | ICD-10-CM

## 2021-04-14 DIAGNOSIS — M7989 Other specified soft tissue disorders: Secondary | ICD-10-CM | POA: Diagnosis not present

## 2021-04-14 DIAGNOSIS — N390 Urinary tract infection, site not specified: Secondary | ICD-10-CM | POA: Diagnosis present

## 2021-04-14 DIAGNOSIS — Z9851 Tubal ligation status: Secondary | ICD-10-CM | POA: Diagnosis not present

## 2021-04-14 DIAGNOSIS — I808 Phlebitis and thrombophlebitis of other sites: Secondary | ICD-10-CM | POA: Diagnosis present

## 2021-04-14 DIAGNOSIS — N3001 Acute cystitis with hematuria: Secondary | ICD-10-CM

## 2021-04-14 DIAGNOSIS — D72829 Elevated white blood cell count, unspecified: Secondary | ICD-10-CM | POA: Diagnosis present

## 2021-04-14 DIAGNOSIS — E1159 Type 2 diabetes mellitus with other circulatory complications: Secondary | ICD-10-CM | POA: Diagnosis not present

## 2021-04-14 DIAGNOSIS — M25432 Effusion, left wrist: Secondary | ICD-10-CM

## 2021-04-14 DIAGNOSIS — N184 Chronic kidney disease, stage 4 (severe): Secondary | ICD-10-CM

## 2021-04-14 DIAGNOSIS — I509 Heart failure, unspecified: Secondary | ICD-10-CM | POA: Diagnosis not present

## 2021-04-14 DIAGNOSIS — Z6837 Body mass index (BMI) 37.0-37.9, adult: Secondary | ICD-10-CM | POA: Diagnosis not present

## 2021-04-14 DIAGNOSIS — I5033 Acute on chronic diastolic (congestive) heart failure: Secondary | ICD-10-CM

## 2021-04-14 DIAGNOSIS — I10 Essential (primary) hypertension: Secondary | ICD-10-CM | POA: Diagnosis not present

## 2021-04-14 DIAGNOSIS — Z20822 Contact with and (suspected) exposure to covid-19: Secondary | ICD-10-CM | POA: Diagnosis present

## 2021-04-14 DIAGNOSIS — A09 Infectious gastroenteritis and colitis, unspecified: Secondary | ICD-10-CM | POA: Diagnosis present

## 2021-04-14 DIAGNOSIS — I130 Hypertensive heart and chronic kidney disease with heart failure and stage 1 through stage 4 chronic kidney disease, or unspecified chronic kidney disease: Secondary | ICD-10-CM | POA: Diagnosis present

## 2021-04-14 DIAGNOSIS — I5031 Acute diastolic (congestive) heart failure: Secondary | ICD-10-CM | POA: Diagnosis not present

## 2021-04-14 DIAGNOSIS — E875 Hyperkalemia: Secondary | ICD-10-CM | POA: Diagnosis present

## 2021-04-14 DIAGNOSIS — I2510 Atherosclerotic heart disease of native coronary artery without angina pectoris: Secondary | ICD-10-CM | POA: Diagnosis present

## 2021-04-14 DIAGNOSIS — L03113 Cellulitis of right upper limb: Secondary | ICD-10-CM | POA: Diagnosis present

## 2021-04-14 DIAGNOSIS — L03114 Cellulitis of left upper limb: Secondary | ICD-10-CM | POA: Diagnosis present

## 2021-04-14 DIAGNOSIS — R197 Diarrhea, unspecified: Secondary | ICD-10-CM | POA: Diagnosis present

## 2021-04-14 DIAGNOSIS — M19032 Primary osteoarthritis, left wrist: Secondary | ICD-10-CM | POA: Diagnosis present

## 2021-04-14 DIAGNOSIS — E1122 Type 2 diabetes mellitus with diabetic chronic kidney disease: Secondary | ICD-10-CM

## 2021-04-14 DIAGNOSIS — J9601 Acute respiratory failure with hypoxia: Secondary | ICD-10-CM

## 2021-04-14 DIAGNOSIS — L409 Psoriasis, unspecified: Secondary | ICD-10-CM | POA: Diagnosis present

## 2021-04-14 DIAGNOSIS — B9620 Unspecified Escherichia coli [E. coli] as the cause of diseases classified elsewhere: Secondary | ICD-10-CM | POA: Diagnosis present

## 2021-04-14 DIAGNOSIS — N179 Acute kidney failure, unspecified: Secondary | ICD-10-CM | POA: Diagnosis present

## 2021-04-14 DIAGNOSIS — T380X5A Adverse effect of glucocorticoids and synthetic analogues, initial encounter: Secondary | ICD-10-CM | POA: Diagnosis present

## 2021-04-14 DIAGNOSIS — E669 Obesity, unspecified: Secondary | ICD-10-CM | POA: Diagnosis present

## 2021-04-14 DIAGNOSIS — I82611 Acute embolism and thrombosis of superficial veins of right upper extremity: Secondary | ICD-10-CM | POA: Diagnosis present

## 2021-04-14 DIAGNOSIS — B9629 Other Escherichia coli [E. coli] as the cause of diseases classified elsewhere: Secondary | ICD-10-CM

## 2021-04-14 DIAGNOSIS — R5381 Other malaise: Secondary | ICD-10-CM

## 2021-04-14 LAB — CREATININE, SERUM
Creatinine, Ser: 2.29 mg/dL — ABNORMAL HIGH (ref 0.44–1.00)
GFR, Estimated: 23 mL/min — ABNORMAL LOW (ref >60)

## 2021-04-14 LAB — CBG MONITORING, ED
Glucose-Capillary: 179 mg/dL — ABNORMAL HIGH (ref 70–99)
Glucose-Capillary: 125 mg/dL — ABNORMAL HIGH (ref 70–99)
Glucose-Capillary: 138 mg/dL — ABNORMAL HIGH (ref 70–99)

## 2021-04-14 LAB — URINALYSIS, ROUTINE W REFLEX MICROSCOPIC
Bilirubin Urine: NEGATIVE
Glucose, UA: 150 mg/dL — AB
Ketones, ur: 5 mg/dL — AB
Nitrite: NEGATIVE
Protein, ur: 100 mg/dL — AB
Specific Gravity, Urine: 1.01 (ref 1.005–1.030)
pH: 5 (ref 5.0–8.0)

## 2021-04-14 LAB — CBC
HCT: 30.4 % — ABNORMAL LOW (ref 36.0–46.0)
Hemoglobin: 9.8 g/dL — ABNORMAL LOW (ref 12.0–15.0)
MCH: 27.1 pg (ref 26.0–34.0)
MCHC: 32.2 g/dL (ref 30.0–36.0)
MCV: 84.2 fL (ref 80.0–100.0)
Platelets: 161 10*3/uL (ref 150–400)
RBC: 3.61 MIL/uL — ABNORMAL LOW (ref 3.87–5.11)
RDW: 14.8 % (ref 11.5–15.5)
WBC: 11.3 10*3/uL — ABNORMAL HIGH (ref 4.0–10.5)
nRBC: 0 % (ref 0.0–0.2)

## 2021-04-14 LAB — BASIC METABOLIC PANEL
Anion gap: 10 (ref 5–15)
BUN: 36 mg/dL — ABNORMAL HIGH (ref 8–23)
CO2: 20 mmol/L — ABNORMAL LOW (ref 22–32)
Calcium: 8.4 mg/dL — ABNORMAL LOW (ref 8.9–10.3)
Chloride: 103 mmol/L (ref 98–111)
Creatinine, Ser: 2.32 mg/dL — ABNORMAL HIGH (ref 0.44–1.00)
GFR, Estimated: 23 mL/min — ABNORMAL LOW (ref >60)
Glucose, Bld: 217 mg/dL — ABNORMAL HIGH (ref 70–99)
Potassium: 4.6 mmol/L (ref 3.5–5.1)
Sodium: 133 mmol/L — ABNORMAL LOW (ref 135–145)

## 2021-04-14 LAB — RESP PANEL BY RT-PCR (FLU A&B, COVID) ARPGX2
Influenza A by PCR: NEGATIVE
Influenza B by PCR: NEGATIVE
SARS Coronavirus 2 by RT PCR: NEGATIVE

## 2021-04-14 LAB — GLUCOSE, CAPILLARY: Glucose-Capillary: 180 mg/dL — ABNORMAL HIGH (ref 70–99)

## 2021-04-14 LAB — TROPONIN I (HIGH SENSITIVITY)
Troponin I (High Sensitivity): 11 ng/L (ref <18)
Troponin I (High Sensitivity): 9 ng/L (ref <18)

## 2021-04-14 LAB — BRAIN NATRIURETIC PEPTIDE: B Natriuretic Peptide: 505.2 pg/mL — ABNORMAL HIGH (ref 0.0–100.0)

## 2021-04-14 LAB — URIC ACID: Uric Acid, Serum: 6 mg/dL (ref 2.5–7.1)

## 2021-04-14 LAB — SEDIMENTATION RATE: Sed Rate: 86 mm/hr — ABNORMAL HIGH (ref 0–30)

## 2021-04-14 MED ORDER — HYDRALAZINE HCL 20 MG/ML IJ SOLN
10.0000 mg | Freq: Four times a day (QID) | INTRAMUSCULAR | Status: DC | PRN
  Administered 2021-04-14 – 2021-04-15 (×3): 10 mg via INTRAVENOUS
  Filled 2021-04-14 (×3): qty 1

## 2021-04-14 MED ORDER — ACETAMINOPHEN 325 MG PO TABS
650.0000 mg | ORAL_TABLET | Freq: Four times a day (QID) | ORAL | Status: DC | PRN
  Administered 2021-04-16: 650 mg via ORAL
  Filled 2021-04-14: qty 2

## 2021-04-14 MED ORDER — FUROSEMIDE 10 MG/ML IJ SOLN
40.0000 mg | Freq: Once | INTRAMUSCULAR | Status: AC
  Administered 2021-04-14: 40 mg via INTRAVENOUS
  Filled 2021-04-14: qty 4

## 2021-04-14 MED ORDER — CEFTRIAXONE SODIUM 1 G IJ SOLR
1.0000 g | INTRAMUSCULAR | Status: DC
  Administered 2021-04-15 – 2021-04-17 (×3): 1 g via INTRAVENOUS
  Filled 2021-04-14 (×3): qty 1

## 2021-04-14 MED ORDER — OXYCODONE-ACETAMINOPHEN 5-325 MG PO TABS
1.0000 | ORAL_TABLET | Freq: Once | ORAL | Status: AC
  Administered 2021-04-14: 1 via ORAL
  Filled 2021-04-14: qty 1

## 2021-04-14 MED ORDER — ONDANSETRON HCL 4 MG/2ML IJ SOLN: 4.0000 mg | Freq: Four times a day (QID) | INTRAMUSCULAR | Status: DC | PRN

## 2021-04-14 MED ORDER — PREDNISONE 50 MG PO TABS
50.0000 mg | ORAL_TABLET | Freq: Every day | ORAL | Status: DC
  Administered 2021-04-14 – 2021-04-16 (×3): 50 mg via ORAL
  Filled 2021-04-14 (×3): qty 1

## 2021-04-14 MED ORDER — ONDANSETRON HCL 4 MG PO TABS: 4.0000 mg | ORAL_TABLET | Freq: Four times a day (QID) | ORAL | Status: DC | PRN

## 2021-04-14 MED ORDER — OXYCODONE-ACETAMINOPHEN 5-325 MG PO TABS
1.0000 | ORAL_TABLET | Freq: Four times a day (QID) | ORAL | Status: DC | PRN
  Administered 2021-04-14 – 2021-04-19 (×7): 1 via ORAL
  Filled 2021-04-14 (×7): qty 1

## 2021-04-14 MED ORDER — ACETAMINOPHEN 650 MG RE SUPP: 650.0000 mg | Freq: Four times a day (QID) | RECTAL | Status: DC | PRN

## 2021-04-14 MED ORDER — ENOXAPARIN SODIUM 40 MG/0.4ML IJ SOSY
40.0000 mg | PREFILLED_SYRINGE | INTRAMUSCULAR | Status: DC
  Administered 2021-04-15 – 2021-04-16 (×2): 40 mg via SUBCUTANEOUS
  Filled 2021-04-14 (×2): qty 0.4

## 2021-04-14 MED ORDER — MORPHINE SULFATE (PF) 2 MG/ML IV SOLN: 2.0000 mg | INTRAVENOUS | Status: DC | PRN

## 2021-04-14 MED ORDER — INSULIN ASPART 100 UNIT/ML IJ SOLN
0.0000 [IU] | Freq: Three times a day (TID) | INTRAMUSCULAR | Status: DC
  Administered 2021-04-14 (×2): 3 [IU] via SUBCUTANEOUS
  Administered 2021-04-14: 4 [IU] via SUBCUTANEOUS
  Administered 2021-04-15 (×2): 11 [IU] via SUBCUTANEOUS
  Administered 2021-04-15: 1 [IU] via SUBCUTANEOUS
  Administered 2021-04-16 (×3): 15 [IU] via SUBCUTANEOUS
  Administered 2021-04-17: 7 [IU] via SUBCUTANEOUS
  Administered 2021-04-17: 11 [IU] via SUBCUTANEOUS
  Administered 2021-04-17: 4 [IU] via SUBCUTANEOUS
  Administered 2021-04-18 (×2): 11 [IU] via SUBCUTANEOUS
  Administered 2021-04-18: 3 [IU] via SUBCUTANEOUS
  Administered 2021-04-19 (×2): 15 [IU] via SUBCUTANEOUS
  Administered 2021-04-19: 20 [IU] via SUBCUTANEOUS
  Administered 2021-04-20: 7 [IU] via SUBCUTANEOUS
  Administered 2021-04-20: 4 [IU] via SUBCUTANEOUS
  Filled 2021-04-14 (×19): qty 1

## 2021-04-14 MED ORDER — SODIUM CHLORIDE 0.9 % IV SOLN
1.0000 g | Freq: Once | INTRAVENOUS | Status: AC
  Administered 2021-04-14: 1 g via INTRAVENOUS
  Filled 2021-04-14: qty 10

## 2021-04-14 MED ORDER — FUROSEMIDE 10 MG/ML IJ SOLN
20.0000 mg | Freq: Two times a day (BID) | INTRAMUSCULAR | Status: DC
  Administered 2021-04-14 – 2021-04-15 (×2): 20 mg via INTRAVENOUS
  Filled 2021-04-14: qty 4
  Filled 2021-04-14: qty 2

## 2021-04-14 MED ORDER — FUROSEMIDE 10 MG/ML IJ SOLN: 20.0000 mg | Freq: Two times a day (BID) | INTRAMUSCULAR | Status: DC

## 2021-04-14 MED ORDER — HYDROMORPHONE HCL 1 MG/ML IJ SOLN: 0.5000 mg | INTRAMUSCULAR | Status: DC | PRN

## 2021-04-14 MED ORDER — ENOXAPARIN SODIUM 30 MG/0.3ML IJ SOSY: 30.0000 mg | PREFILLED_SYRINGE | INTRAMUSCULAR | Status: DC

## 2021-04-14 MED ORDER — ENOXAPARIN SODIUM 40 MG/0.4ML IJ SOSY
40.0000 mg | PREFILLED_SYRINGE | INTRAMUSCULAR | Status: DC
  Administered 2021-04-14: 40 mg via SUBCUTANEOUS
  Filled 2021-04-14: qty 0.4

## 2021-04-14 MED ORDER — HYDROCODONE-ACETAMINOPHEN 5-325 MG PO TABS
1.0000 | ORAL_TABLET | ORAL | Status: DC | PRN
  Administered 2021-04-14: 1 via ORAL
  Administered 2021-04-14 – 2021-04-16 (×3): 2 via ORAL
  Filled 2021-04-14: qty 2
  Filled 2021-04-14: qty 1
  Filled 2021-04-14 (×2): qty 2

## 2021-04-14 MED ORDER — ONDANSETRON HCL 4 MG/2ML IJ SOLN
4.0000 mg | Freq: Once | INTRAMUSCULAR | Status: AC
Start: 2021-04-14 — End: 2021-04-14
  Administered 2021-04-14: 4 mg via INTRAVENOUS
  Filled 2021-04-14: qty 2

## 2021-04-14 MED ORDER — INSULIN ASPART 100 UNIT/ML IJ SOLN
0.0000 [IU] | Freq: Every day | INTRAMUSCULAR | Status: DC
  Administered 2021-04-15: 3 [IU] via SUBCUTANEOUS
  Administered 2021-04-16: 2 [IU] via SUBCUTANEOUS
  Administered 2021-04-17: 3 [IU] via SUBCUTANEOUS
  Administered 2021-04-19: 4 [IU] via SUBCUTANEOUS
  Filled 2021-04-14 (×4): qty 1

## 2021-04-14 NOTE — Assessment & Plan Note (Addendum)
Started on ceftriaxone. Unclear etiology.  Patient has history of psoriasis.  X-ray of the wrist did not show any fracture or dislocation but showed a small radiopaque in the soft tissue.  She has cellulitis changes on the right wrist with severe tenderness and edema. Elevated ESR, CRP.ANA negative but rheumatoid factor came out to be positive.  Uric acid level is normal.  Started on prednisone for possibility of inflammatory arthritis, now being tapered.  Continue pain management, supportive care.  Leukocytosis is likely secondary to steroids, improving.  Inflammatory arthritis is a possibility.  She needs to follow-up with rheumatology as an outpatient We also consulted orthopedics . Recommended to continue antibiotics.  Antibiotics changed to doxycycline.  Orthopedics closely monitoring for need of I&D if no further improvement in the wrist pain.

## 2021-04-14 NOTE — Consult Note (Addendum)
Cardiology Consultation:   Patient ID: Katrina Ortiz MRN: [id number]; DOB: 1955/06/19  Admit date: 04/14/2021 Date of Consult: 04/14/2021  PCP:  Lowella Bandy, MD   [facility] Prov[facility] Cardiologist: [facility] cardiology    Patient Profile:   Katrina Ortiz is a 66 y.o. female with a hx of CAD/CABG who is being seen 04/14/2021 for the evaluation of shortness of breath at the request of Dr. Damita Dunnings.  History of Present Illness:   [name] is a 66 year old female with history of CAD/CABG x2 (2003-LIMA-LAD, Radial-OM), hypertension, diabetes, HFpEF, CKD 4 presenting with generalized malaise and shortness of breath.  Patient admitted 3 days ago due to hypoglycemia.  She was treated and discharged yesterday.  She states not feeling well upon discharge, with symptoms of nausea generalized muscle aches, shortness of breath.  When she went home, she states feeling acutely out of breath, chills, generalized unwell causing her to call EMS.  In the ED, erythema noted on left arm, started on antibiotics for possible cellulitis.  Chest x-ray showed bilateral pleural effusions, given IV Lasix 40x1, started on Lasix 20 mg IV twice daily.  EKG on admission showed normal sinus rhythm, no acute changes, troponins were normal at 9, 11.   Past Medical History:  Diagnosis Date   Diabetes mellitus without complication ([place])    Hypertension    Psoriasis     Past Surgical History:  Procedure Laterality Date   CESAREAN SECTION     2 c-sections   CORONARY ARTERY BYPASS GRAFT     CORONARY STENT PLACEMENT     HERNIA REPAIR     TUBAL LIGATION       Home Medications:  Prior to Admission medications   Medication Sig Start Date End Date Taking? Authorizing Provider  amLODipine (NORVASC) 10 MG tablet Take 10 mg by mouth daily. 12/28/20   [provider]  carvedilol (COREG) 25 MG tablet Take 25 mg by mouth 2 (two) times daily. 03/07/19   [provider]  clopidogrel (PLAVIX)  75 MG tablet Take 75 mg by mouth daily. 03/28/19   [provider]  DULoxetine (CYMBALTA) 60 MG capsule Take 60 mg by mouth daily. 03/28/19   [provider]  furosemide (LASIX) 40 MG tablet Take 40 mg by mouth 2 (two) times daily. 12/03/20   [provider]  gabapentin (NEURONTIN) 300 MG capsule Take 300 mg by mouth at bedtime. 12/29/20   [provider]  JARDIANCE 10 MG TABS tablet Take 10 mg by mouth every morning. 02/25/21   [provider]  LANTUS SOLOSTAR 100 UNIT/ML Solostar Pen Inject 10 Units into the skin at bedtime. Hold if sugar < 100 04/11/21   Max Sane, MD  omeprazole (PRILOSEC) 20 MG capsule Take 20 mg by mouth daily at 12 noon.    [provider]  pravastatin (PRAVACHOL) 80 MG tablet Take 80 mg by mouth daily. 03/07/19   [provider]  valsartan (DIOVAN) 40 MG tablet Take 40 mg by mouth daily. 01/28/21   [provider]    Inpatient Medications: Scheduled Meds:  enoxaparin (LOVENOX) injection  40 mg Subcutaneous Q24H   furosemide  20 mg Intravenous BID   insulin aspart  0-20 Units Subcutaneous TID WC   insulin aspart  0-5 Units Subcutaneous QHS   Continuous Infusions:  [START ON 04/15/2021] cefTRIAXone (ROCEPHIN)  IV     PRN Meds: acetaminophen **OR** acetaminophen, hydrALAZINE, HYDROcodone-acetaminophen, morphine injection, ondansetron **OR** ondansetron (ZOFRAN) IV  Allergies:   No Known  Allergies  Social History:   Social History   Socioeconomic History   Marital status: Married    Spouse name: Not on file   Number of children: Not on file   Years of education: Not on file   Highest education level: Not on file  Occupational History   Not on file  Tobacco Use   Smoking status: Former    Types: Cigarettes    Quit date: 11/22/2002    Years since quitting: 18.4   Smokeless tobacco: Never  Substance and Sexual Activity   Alcohol use: No   Drug use: No   Sexual activity: Not on file  Other  Topics Concern   Not on file  Social History Narrative   Not on file   Social Determinants of Health   Financial Resource Strain: Not on file  Food Insecurity: Not on file  Transportation Needs: Not on file  Physical Activity: Not on file  Stress: Not on file  Social Connections: Not on file  Intimate Partner Violence: Not on file    Family History:   History reviewed. No pertinent family history.   ROS:  Please see the history of present illness.   All other ROS reviewed and negative.     Physical Exam/Data:   Vitals:   04/14/21 0832 04/14/21 0915 04/14/21 1045 04/14/21 1130  BP: 136/74 (!) 159/58 (!) 171/62 (!) 141/50  Pulse: 78 79 80 85  Resp: 17 16 17 15   Temp:      TempSrc:      SpO2: 97% 98% 99% 99%  Weight:      Height:       No intake or output data in the 24 hours ending 04/14/21 1338 Last 3 Weights 04/13/2021 04/09/2021 01/04/2020  Weight (lbs) 208 lb 208 lb 190 lb  Weight (kg) 94.348 kg 94.348 kg 86.183 kg     Body mass index is 36.85 kg/m.  General: Soft-spoken, appears ill HEENT: normal Neck: no JVD Vascular: No carotid bruits; Distal pulses 2+ bilaterally Cardiac:  normal S1, S2; RRR; no murmur  Lungs:  clear anteriorly, decreased breath sounds at bases Abd: soft, nontender, no hepatomegaly  Ext: 1+ edema Musculoskeletal:  No deformities, BUE and BLE strength normal and equal Skin: Erythema noted on left arm Neuro:  CNs 2-12 intact, no focal abnormalities noted Psych:  Normal affect   EKG:  The EKG was personally reviewed and demonstrates: Normal sinus rhythm  Telemetry:  Telemetry was personally reviewed and demonstrates: Sinus rhythm  Relevant CV Studies: TTE 03/2019 1. Left ventricular ejection fraction, by estimation, is 60 to 65%. The  left ventricle has normal function. The left ventricle has no regional  wall motion abnormalities. Left ventricular diastolic parameters are  consistent with Grade I diastolic  dysfunction (impaired  relaxation).   2. Right ventricular systolic function is normal. The right ventricular  size is normal. Tricuspid regurgitation signal is inadequate for assessing  PA pressure.   Laboratory Data:  High Sensitivity Troponin:   Recent Labs  Lab 04/09/21 1328 04/09/21 1839 04/13/21 2354 04/14/21 0240  TROPONINIHS 4 4 9 11      Chemistry Recent Labs  Lab 04/11/21 0640 04/12/21 0449 04/13/21 2354 04/14/21 0240  NA 140 139 133*  --   K 5.1 4.9 4.6  --   CL 112* 109 103  --   CO2 19* 21* 20*  --   GLUCOSE 124* 118* 217*  --   BUN 41* 40* 36*  --   CREATININE  2.43* 2.36* 2.32* 2.29*  CALCIUM 8.1* 8.4* 8.4*  --   GFRNONAA 21* 22* 23* 23*  ANIONGAP 9 9 10   --     No results for input(s): PROT, ALBUMIN, AST, ALT, ALKPHOS, BILITOT in the last 168 hours. Lipids No results for input(s): CHOL, TRIG, HDL, LABVLDL, LDLCALC, CHOLHDL in the last 168 hours.  Hematology Recent Labs  Lab 04/11/21 0640 04/12/21 0449 04/13/21 2354  WBC 7.4 7.3 11.3*  RBC 3.04* 3.27* 3.61*  HGB 8.2* 8.7* 9.8*  HCT 26.2* 28.1* 30.4*  MCV 86.2 85.9 84.2  MCH 27.0 26.6 27.1  MCHC 31.3 31.0 32.2  RDW 15.2 15.0 14.8  PLT 163 172 161   Thyroid No results for input(s): TSH, FREET4 in the last 168 hours.  BNP Recent Labs  Lab 04/13/21 2354  BNP 505.2*    DDimer No results for input(s): DDIMER in the last 168 hours.   Radiology/Studies:  DG Chest 2 View  Result Date: 04/14/2021 CLINICAL DATA:  Dizziness and weakness. EXAM: CHEST - 2 VIEW COMPARISON:  April 09, 2021 FINDINGS: Multiple sternal wires are seen. Decreased lung volumes are noted with mild, diffuse, chronic appearing increased interstitial lung markings. Mild atelectasis is seen along the medial aspect of the left lung base. A very small left pleural effusion is noted. This is mildly decreased in size when compared to the prior exam. No pneumothorax is identified. The cardiac silhouette is mildly enlarged and unchanged in size. Moderate  severity calcification of the aortic arch is noted. Degenerative changes are seen throughout the thoracic spine. IMPRESSION: 1. Evidence of prior median sternotomy/CABG. 2. Chronic appearing increased interstitial lung markings with mild left basilar atelectasis. 3. Small left pleural effusion, mildly decreased in size when compared to the prior study. Electronically Signed   By: Virgina Norfolk M.D.   On: 04/14/2021 00:22   DG Wrist Complete Left  Result Date: 04/14/2021 CLINICAL DATA:  Dizziness, weakness and left arm pain. EXAM: LEFT WRIST - COMPLETE 3+ VIEW COMPARISON:  None. FINDINGS: There is no evidence of fracture or dislocation. Marked severity degenerative changes are seen involving the carpometacarpal articulation of the left thumb. There is mild diffuse soft tissue swelling with a small, thin linear radiopaque soft tissue foreign body seen within the soft tissues adjacent to the lateral aspect of the distal left radius. IMPRESSION: Marked severity degenerative changes, without evidence of an acute osseous abnormality. Electronically Signed   By: Virgina Norfolk M.D.   On: 04/14/2021 00:25   DG Abd 1 View  Result Date: 04/11/2021 CLINICAL DATA:  Hypoxia and hypoglycemia.  Nausea. EXAM: ABDOMEN - 1 VIEW COMPARISON:  None. FINDINGS: Bowel gas pattern is normal. No evidence of ileus or obstruction. No abnormal calcifications. Tubal ligation clips in the pelvis. Chronic degenerative changes of the lumbar spine. IMPRESSION: No acute or significant finding. Electronically Signed   By: Nelson Chimes M.D.   On: 04/11/2021 15:55   US Venous Img Upper Uni Right(DVT)  Result Date: 04/14/2021 CLINICAL DATA:  Right arm swelling for 1 day EXAM: RIGHT UPPER EXTREMITY VENOUS DOPPLER ULTRASOUND TECHNIQUE: Gray-scale sonography with graded compression, as well as color Doppler and duplex ultrasound were performed to evaluate the upper extremity deep venous system from the level of the subclavian vein and  including the jugular, axillary, basilic, radial, ulnar and upper cephalic vein. Spectral Doppler was utilized to evaluate flow at rest and with distal augmentation maneuvers. COMPARISON:  None. FINDINGS: Contralateral Subclavian Vein: Respiratory phasicity is normal and symmetric  with the symptomatic side. No evidence of thrombus. Normal compressibility. Internal Jugular Vein: No evidence of thrombus. Subclavian Vein: No evidence of thrombus. Axillary Vein: No evidence of thrombus. Cephalic Vein: Occlusive thrombus at the level of the antecubital fossa involving a segment at least 3 cm in length. This is in the superficial system. Basilic Vein: No evidence of thrombus. Brachial Veins: No evidence of thrombus. Radial Veins: No evidence of thrombus. Ulnar Veins: No evidence of thrombus. Venous Reflux:  None visualized. IMPRESSION: Occlusive cephalic vein thrombosis in the right antecubital region, reportedly site of prior IV access. Electronically Signed   By: Jorje Guild M.D.   On: 04/14/2021 04:24   CT CHEST ABDOMEN PELVIS WO CONTRAST  Result Date: 04/14/2021 CLINICAL DATA:  Shortness of breath, weakness and nausea. EXAM: CT CHEST, ABDOMEN AND PELVIS WITHOUT CONTRAST TECHNIQUE: Multidetector CT imaging of the chest, abdomen and pelvis was performed following the standard protocol without IV contrast. RADIATION DOSE REDUCTION: This exam was performed according to the departmental dose-optimization program which includes automated exposure control, adjustment of the mA and/or kV according to patient size and/or use of iterative reconstruction technique. COMPARISON:  Chest CT without contrast 04/09/2021, CT abdomen pelvis with IV contrast 02/27/2019 FINDINGS: CT CHEST FINDINGS Cardiovascular: The cardiac size is upper-normal. Pulmonary veins are normal caliber and there is no significant pericardial effusion. There is heavy native three-vessel calcific CAD with sternotomy changes and old CABG. There is aortic  atherosclerosis without aneurysm. The pulmonary arteries are normal caliber. Mediastinum/Nodes: The lower poles of the thyroid gland, trachea, thoracic esophagus are unremarkable. Axillary spaces are clear. No intrathoracic adenopathy is seen. Lungs/Pleura: There is a moderate-sized left and small right layering pleural effusions, both increased. Adjacent consolidation or atelectasis in left lower lobe appears similar to the previous exam as well as posterior atelectasis in the right lower lobe. Scar-like opacities are again seen in the right middle lobe with additional ground-glass pneumonitis or atelectasis posteriorly in the right upper lobe also unchanged in appearance. Left upper lobe is clear except for atelectasis in the lingular base. The central airways are clear. 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 Osteopenia. CT ABDOMEN PELVIS FINDINGS Hepatobiliary: 19 cm length mildly steatotic liver, otherwise unremarkable without contrast. Gallbladder and bile ducts are unremarkable. Pancreas: Partially atrophic, otherwise unremarkable without contrast. Spleen: Normal in size and noncontrast attenuation. Adrenals/Urinary Tract: There is no adrenal mass. Wedge-shaped scar-like defect noted in the left upper renal cortex, otherwise no focal abnormality in the unenhanced renal cortex is seen. Perinephric stranding is similar to prior study as well as trace perinephric fluid. There is no evidence of urinary stone or obstruction. The bladder wall is thickened but not fully distended and there is air in the bladder. Stomach/Bowel: No dilatation or wall thickening including the appendix. Scattered colonic diverticula without focal inflammation. Vascular/Lymphatic: Aortic atherosclerosis. No enlarged abdominal or pelvic lymph nodes. Reproductive: The uterus is intact. There are BTL changes and a subserosal 3.1 cm fundal fibroid again noted of the right. No adnexal mass is  seen. The endometrium is more prominent than usually seen at this age, measuring 8 mm. A similar finding was seen previously. Nonemergent ultrasound follow-up is recommended. Other: Small umbilical fat hernia. There is trace posterior deep pelvic ascites. There is no free air, hemorrhage or abscess. Musculoskeletal: Advanced facet hypertrophy lower lumbar spine with chronic grade 1 L4-5 degenerative anterolisthesis and acquired spinal stenosis. Similar severe spinal stenosis L3-4. No destructive bone lesion is  seen. IMPRESSION: 1. Moderate left and small layering right pleural effusions, both increased from 5 days ago. Adjacent atelectasis or consolidation in the left lower lobe appears similar. 2. Resolution of small ground-glass infiltrates in the posterior left upper lobe previously with similar appearance of ground-glass atelectasis or infiltrates in the posterior right upper lobe. 3. Prior CABG, with upper-normal heart size, aortic and coronary artery atherosclerosis. 4. Cystitis versus bladder nondistention, with air in the bladder which could be due to instrumentation or infectious process. Correlate clinically with urinalysis. 5. Additional chronic findings in the abdomen and pelvis with no other new abnormality. 6. Prominent endometrium for age. A similar finding was noted previously. Nonemergent ultrasound follow-up recommended. 7. Trace ascites. 8. Osteopenia and degenerative change with severe acquired spinal stenosis L3-4 and L4-5. Electronically Signed   By: Telford Nab M.D.   On: 04/14/2021 03:32   US ABDOMEN LIMITED RUQ (LIVER/GB)  Result Date: 04/11/2021 CLINICAL DATA:  66 year old female with nausea and vomiting for 1 day EXAM: ULTRASOUND ABDOMEN LIMITED RIGHT UPPER QUADRANT COMPARISON:  02/27/2019 CT. FINDINGS: Gallbladder: A few mobile gallstones are noted, the largest measuring 5 mm. There is no evidence of gallbladder wall thickening, pericholecystic fluid or sonographic Murphy sign.  Common bile duct: Diameter: 3.2 mm. No intrahepatic or extrahepatic biliary dilatation identified. Liver: No focal lesion identified. Within normal limits in parenchymal echogenicity. Portal vein is patent on color Doppler imaging with normal direction of blood flow towards the liver. Other: None. IMPRESSION: 1. Cholelithiasis without evidence of acute cholecystitis. 2. No other significant abnormalities.  No biliary dilatation. Electronically Signed   By: Margarette Canada M.D.   On: 04/11/2021 18:11     Assessment and Plan:   History of HFpEF, shortness of breath -1+ edema on exam -Continue IV Lasix 20 mg twice daily, monitor creatinine closely -May need a small dose of Lasix upon discharge about 20 mg daily on -Repeat echo ordered, currently pending -If echocardiogram is unremarkable, anticipate discharge on small dose of Lasix for close follow-up with primary cardiologist at [facility].  2. Hx of cad/cabg x 2 -No chest pain -Troponins normal, no plans for ischemic work-up -Restart PTA Plavix, statin.  3.  Nausea, hyperglycemia, myalgias, generalized malaise -Management as per primary team.  Total encounter time more than 80 minutes or more  Greater than 50% was spent in counseling and coordination of care with the patient  Signed, Kate Sable, MD  04/14/2021 1:38 PM

## 2021-04-14 NOTE — H&P (Signed)
History and Physical    Patient: Katrina Ortiz WUJ:[id number] DOB: 1955-09-29 DOA: 04/14/2021 DOS: the patient was seen and examined on 04/14/2021 PCP: Lowella Bandy, MD  Patient coming from: Home  Chief Complaint:  Chief Complaint  Patient presents with   Dizziness   Weakness   Shortness of Breath    HPI: Katrina Ortiz is a 66 y.o. female with medical history significant of Diastolic CHF, CAD with history of CABG, HTN, DM hospitalized from 2/17 to 2/19 with unresponsiveness related to hypoglycemia with blood sugar 28 as well as AKI, and hypoxia, resolving with D10 infusion who presents to the ED with nausea, shortness of breath, generalized weakness and also complains of pain and redness of the left wrist and the right antecubital area.  She denies fever or chills.  She denies injury to the area or insect bites.  She denies chest pain.  EMS reported an O2 sat of 86% on room air.  Patient has used oxygen in the past but is not currently on O2 On arrival temperature 99.6 with pulse of 92.  BP 190/75 and respirations 22 with O2 sat 97% on room air Blood work significant for leukocytosis of 11,000 and hemoglobin 9.8 which is about her baseline.  Troponin 9-11 and BNP 505.  Creatinine 2.32, improved from recent admission when it was 3.16.  Urinalysis with trace leukocyte esterase and many bacteria EKG showed normal sinus rhythm at 92 with no acute ST-T wave changes Chest x-ray of the left wrist was nonacute.  Chest x-ray showed bilateral pleural effusions.  Right upper extremity venous Doppler done to evaluate for DVT, still pending CT chest abdomen and pelvis showed pleural effusions, resolution of prior groundglass atelectasis, prior CABG and cystitis in several other findings Please refer to report Patient started on IV Rocephin, given a dose of Lasix.  Hospitalist consulted for admission.   Review of Systems: As mentioned in the history of present illness. All other systems reviewed  and are negative. Past Medical History:  Diagnosis Date   Diabetes mellitus without complication ([place])    Hypertension    Psoriasis    Past Surgical History:  Procedure Laterality Date   CESAREAN SECTION     2 c-sections   CORONARY ARTERY BYPASS GRAFT     CORONARY STENT PLACEMENT     HERNIA REPAIR     TUBAL LIGATION     Social History:  reports that she quit smoking about 18 years ago. She has never used smokeless tobacco. She reports that she does not drink alcohol and does not use drugs.  No Known Allergies  History reviewed. No pertinent family history.  Prior to Admission medications   Medication Sig Start Date End Date Taking? Authorizing Provider  amLODipine (NORVASC) 10 MG tablet Take 10 mg by mouth daily. 12/28/20   [provider]  carvedilol (COREG) 25 MG tablet Take 25 mg by mouth 2 (two) times daily. 03/07/19   [provider]  clopidogrel (PLAVIX) 75 MG tablet Take 75 mg by mouth daily. 03/28/19   [provider]  DULoxetine (CYMBALTA) 60 MG capsule Take 60 mg by mouth daily. 03/28/19   [provider]  furosemide (LASIX) 40 MG tablet Take 40 mg by mouth 2 (two) times daily. 12/03/20   [provider]  gabapentin (NEURONTIN) 300 MG capsule Take 300 mg by mouth at bedtime. 12/29/20   [provider]  JARDIANCE 10 MG TABS tablet Take 10 mg by mouth every morning. 02/25/21  [provider]  LANTUS SOLOSTAR 100 UNIT/ML Solostar Pen Inject 10 Units into the skin at bedtime. Hold if sugar < 100 04/11/21   Max Sane, MD  omeprazole (PRILOSEC) 20 MG capsule Take 20 mg by mouth daily at 12 noon.    [provider]  pravastatin (PRAVACHOL) 80 MG tablet Take 80 mg by mouth daily. 03/07/19   [provider]  valsartan (DIOVAN) 40 MG tablet Take 40 mg by mouth daily. 01/28/21   [provider]    Physical Exam: Vitals:   04/13/21 2347 04/14/21 0230 04/14/21 0245 04/14/21 0415  BP: (!) 190/75 (!)  191/134 (!) 208/78 (!) 199/83  Pulse: 92 93 95 94  Resp: (!) 22 18 (!) 26 (!) 22  Temp: 99.6 F (37.6 C)   99.2 F (37.3 C)  TempSrc: Oral   Oral  SpO2: 94% 96% 97% 97%  Weight: 94.3 kg     Height: 5\' 3"  (1.6 m)      Physical Exam Vitals and nursing note reviewed.  Constitutional:      General: She is not in acute distress.    Appearance: Normal appearance. She is obese.  HENT:     Head: Normocephalic and atraumatic.  Cardiovascular:     Rate and Rhythm: Normal rate and regular rhythm.     Pulses: Normal pulses.     Heart sounds: Normal heart sounds. No murmur heard. Pulmonary:     Effort: Pulmonary effort is normal. Tachypnea present.     Breath sounds: Rales present. No wheezing or rhonchi.  Abdominal:     General: Bowel sounds are normal.     Palpations: Abdomen is soft.     Tenderness: There is no abdominal tenderness.  Musculoskeletal:        General: No swelling or tenderness. Normal range of motion.     Cervical back: Normal range of motion and neck supple.     Comments: Pain on range of motion left wrist  Skin:    General: Skin is warm and dry.     Comments: Erythema over left wrist and then right antecubital fossa  Neurological:     General: No focal deficit present.     Mental Status: She is alert. Mental status is at baseline.  Psychiatric:        Mood and Affect: Mood normal.        Behavior: Behavior normal.     Data Reviewed: Data Reviewed: Relevant notes from primary care and specialist visits, past discharge summaries as available in EHR, including Care Everywhere. Prior diagnostic testing as pertinent to current admission diagnoses Updated medications and problem lists for reconciliation ED course, including vitals, labs, imaging, treatment and response to treatment Triage notes, nursing and pharmacy notes and ED provider's notes Notable results as noted in HPI   Assessment and Plan: * Acute on chronic diastolic CHF (congestive heart failure)  (HCC) IV Lasix Continue home carvedilol.  Hold valsartan due to renal function Daily weights with intake and output monitoring Echocardiogram Cardiology consult  Cellulitis of right forearm Possible thrombophlebitis given location and right antecubital fossa, site of location of recent IV IV Rocephin Pain control Follow-up venous ultrasound of right upper extremity   UTI (urinary tract infection) IV Rocephin and follow cultures  Physical deconditioning Might benefit from short-term rehab Consider TOC consult  Hx of CABG Continue aspirin and statins Troponins negative, EKG nonacute and no complaints of chest pain  Anemia of chronic kidney failure, stage 4 (severe) ([place])  At baseline  CKD (chronic kidney disease) stage 4, GFR 15-29 ml/min (HCC) Appears to be at baseline  Cellulitis of left wrist Follow-up Uric acid ordered to evaluate for gout Consider orthopedic consult if not improving with treatment for cellulitis   Type 2 diabetes mellitus ([place]) Sliding scale insulin coverage       Advance Care Planning:   Code Status: Full Code   Consults: Cardiology  Family Communication: None  Severity of Illness: The appropriate patient status for this patient is INPATIENT. Inpatient status is judged to be reasonable and necessary in order to provide the required intensity of service to ensure the patient's safety. The patient's presenting symptoms, physical exam findings, and initial radiographic and laboratory data in the context of their chronic comorbidities is felt to place them at high risk for further clinical deterioration. Furthermore, it is not anticipated that the patient will be medically stable for discharge from the hospital within 2 midnights of admission.   * I certify that at the point of admission it is my clinical judgment that the patient will require inpatient hospital care spanning beyond 2 midnights from the point of admission due to high intensity of  service, high risk for further deterioration and high frequency of surveillance required.*  Author: Athena Masse, MD 04/14/2021 4:24 AM  For on call review [email or web address].

## 2021-04-14 NOTE — ED Notes (Signed)
Speaking with lab about which tubes for blood to be drawn in

## 2021-04-14 NOTE — ED Provider Notes (Signed)
[facility] Provider Note    Event Date/Time   First MD Initiated Contact with Patient 04/14/21 0215     (approximate)   History   Dizziness, Weakness, and Shortness of Breath   HPI  Katrina Ortiz is a 66 y.o. female with a history of diabetes, hypertension, diastolic CHF, chronic kidney disease who presents from home for several complaints.  Patient was discharged from the hospital yesterday after being admitted for acute kidney injury and hypoglycemia.  She reports that she started feeling short of breath yesterday before leaving the hospital but that was mild.  Her shortness of breath has become progressively worse throughout the day. She is also complaining of abdominal pain, diffuse, sharp associated with several episodes of watery diarrhea with no melena or blood.  She also has had nausea.  She is also complaining of left wrist pain that started 2 days ago.  She denies any known trauma.  No history of gout.  She is also complaining of redness and swelling of the right AC fossa where she had an IV placed during her last visit.  No chest pain, no fever or chills, no leg swelling.  No prior history of PE or DVT.  Patient was receiving IM Lovenox while in the hospital.  When EMS arrived to the house patient was 86% on room air.  Patient does report having a oxygen tank at home but she does not use it and she is not supposed to be on it.     Past Medical History:  Diagnosis Date   Diabetes mellitus without complication ([place])    Hypertension    Psoriasis     Past Surgical History:  Procedure Laterality Date   CESAREAN SECTION     2 c-sections   CORONARY ARTERY BYPASS GRAFT     CORONARY STENT PLACEMENT     HERNIA REPAIR     TUBAL LIGATION       Physical Exam   Triage Vital Signs: ED Triage Vitals [04/13/21 2347]  Enc Vitals Group     BP (!) 190/75     Pulse Rate 92     Resp (!) 22     Temp 99.6 F (37.6 C)     Temp Source Oral     SpO2 94 %      Weight 208 lb (94.3 kg)     Height 5\' 3"  (1.6 m)     Head Circumference      Peak Flow      Pain Score 10     Pain Loc      Pain Edu?      Excl. in [place]?     Most recent vital signs: Vitals:   04/14/21 0415 04/14/21 0500  BP: (!) 199/83 (!) 178/68  Pulse: 94 90  Resp: (!) 22 (!) 23  Temp: 99.2 F (37.3 C)   SpO2: 97% 99%     Constitutional: Alert and oriented, mild respiratory distress.  HEENT:      Head: Normocephalic and atraumatic.         Eyes: Conjunctivae are normal. Sclera is non-icteric.       Mouth/Throat: Mucous membranes are moist.       Neck: Supple with no signs of meningismus. Cardiovascular: Regular rate and rhythm. No murmurs, gallops, or rubs. 2+ symmetrical distal pulses are present in all extremities.  Respiratory: Normal respiratory effort. Lungs are clear to auscultation bilaterally.  Gastrointestinal: Soft, non tender, and non distended with positive bowel  sounds. No rebound or guarding. Genitourinary: No CVA tenderness. Musculoskeletal: Trace edema bilaterally.  There is erythema and swelling of the right AC at the site of a previous IV.  There is swelling, warmth, and erythema of the left wrist with reduced range of motion Neurologic: Normal speech and language. Face is symmetric. Moving all extremities. No gross focal neurologic deficits are appreciated. Skin: Skin is warm, dry and intact. No rash noted. Psychiatric: Mood and affect are normal. Speech and behavior are normal.  ED Results / Procedures / Treatments   Labs (all labs ordered are listed, but only abnormal results are displayed) Labs Reviewed  BASIC METABOLIC PANEL - Abnormal; Notable for the following components:      Result Value   Sodium 133 (*)    CO2 20 (*)    Glucose, Bld 217 (*)    BUN 36 (*)    Creatinine, Ser 2.32 (*)    Calcium 8.4 (*)    GFR, Estimated 23 (*)    All other components within normal limits  CBC - Abnormal; Notable for the following components:   WBC 11.3  (*)    RBC 3.61 (*)    Hemoglobin 9.8 (*)    HCT 30.4 (*)    All other components within normal limits  URINALYSIS, ROUTINE W REFLEX MICROSCOPIC - Abnormal; Notable for the following components:   Color, Urine YELLOW (*)    APPearance HAZY (*)    Glucose, UA 150 (*)    Hgb urine dipstick MODERATE (*)    Ketones, ur 5 (*)    Protein, ur 100 (*)    Leukocytes,Ua TRACE (*)    Bacteria, UA MANY (*)    All other components within normal limits  BRAIN NATRIURETIC PEPTIDE - Abnormal; Notable for the following components:   B Natriuretic Peptide 505.2 (*)    All other components within normal limits  CREATININE, SERUM - Abnormal; Notable for the following components:   Creatinine, Ser 2.29 (*)    GFR, Estimated 23 (*)    All other components within normal limits  CBG MONITORING, ED - Abnormal; Notable for the following components:   Glucose-Capillary 189 (*)    All other components within normal limits  RESP PANEL BY RT-PCR (FLU A&B, COVID) [id number]  URINE CULTURE  C DIFFICILE QUICK SCREEN W PCR REFLEX    URIC ACID  HEMOGLOBIN A1C  TROPONIN I (HIGH SENSITIVITY)  TROPONIN I (HIGH SENSITIVITY)     EKG  ED ECG REPORT I, Rudene Re, the attending physician, personally viewed and interpreted this ECG.  Normal sinus rhythm with a rate of 92, normal intervals, normal axis, no ST elevations or depressions, anterior Q waves.  Unchanged from prior   RADIOLOGY I, Rudene Re, attending MD, have personally viewed and interpreted the images obtained during this visit as below:  Chest x-ray with bilateral pleural effusion  X-ray of the wrist with no fracture  Doppler study of the right upper extremity with superficial thrombosis but no signs of DVT   ___________________________________________________ Interpretation by Radiologist:  DG Chest 2 View  Result Date: 04/14/2021 CLINICAL DATA:  Dizziness and weakness. EXAM: CHEST - 2 VIEW COMPARISON:  April 09, 2021  FINDINGS: Multiple sternal wires are seen. Decreased lung volumes are noted with mild, diffuse, chronic appearing increased interstitial lung markings. Mild atelectasis is seen along the medial aspect of the left lung base. A very small left pleural effusion is noted. This is mildly decreased in size when compared to the  prior exam. No pneumothorax is identified. The cardiac silhouette is mildly enlarged and unchanged in size. Moderate severity calcification of the aortic arch is noted. Degenerative changes are seen throughout the thoracic spine. IMPRESSION: 1. Evidence of prior median sternotomy/CABG. 2. Chronic appearing increased interstitial lung markings with mild left basilar atelectasis. 3. Small left pleural effusion, mildly decreased in size when compared to the prior study. Electronically Signed   By: Virgina Norfolk M.D.   On: 04/14/2021 00:22   DG Wrist Complete Left  Result Date: 04/14/2021 CLINICAL DATA:  Dizziness, weakness and left arm pain. EXAM: LEFT WRIST - COMPLETE 3+ VIEW COMPARISON:  None. FINDINGS: There is no evidence of fracture or dislocation. Marked severity degenerative changes are seen involving the carpometacarpal articulation of the left thumb. There is mild diffuse soft tissue swelling with a small, thin linear radiopaque soft tissue foreign body seen within the soft tissues adjacent to the lateral aspect of the distal left radius. IMPRESSION: Marked severity degenerative changes, without evidence of an acute osseous abnormality. Electronically Signed   By: Virgina Norfolk M.D.   On: 04/14/2021 00:25   US Venous Img Upper Uni Right(DVT)  Result Date: 04/14/2021 CLINICAL DATA:  Right arm swelling for 1 day EXAM: RIGHT UPPER EXTREMITY VENOUS DOPPLER ULTRASOUND TECHNIQUE: Gray-scale sonography with graded compression, as well as color Doppler and duplex ultrasound were performed to evaluate the upper extremity deep venous system from the level of the subclavian vein and  including the jugular, axillary, basilic, radial, ulnar and upper cephalic vein. Spectral Doppler was utilized to evaluate flow at rest and with distal augmentation maneuvers. COMPARISON:  None. FINDINGS: Contralateral Subclavian Vein: Respiratory phasicity is normal and symmetric with the symptomatic side. No evidence of thrombus. Normal compressibility. Internal Jugular Vein: No evidence of thrombus. Subclavian Vein: No evidence of thrombus. Axillary Vein: No evidence of thrombus. Cephalic Vein: Occlusive thrombus at the level of the antecubital fossa involving a segment at least 3 cm in length. This is in the superficial system. Basilic Vein: No evidence of thrombus. Brachial Veins: No evidence of thrombus. Radial Veins: No evidence of thrombus. Ulnar Veins: No evidence of thrombus. Venous Reflux:  None visualized. IMPRESSION: Occlusive cephalic vein thrombosis in the right antecubital region, reportedly site of prior IV access. Electronically Signed   By: Jorje Guild M.D.   On: 04/14/2021 04:24   CT CHEST ABDOMEN PELVIS WO CONTRAST  Result Date: 04/14/2021 CLINICAL DATA:  Shortness of breath, weakness and nausea. EXAM: CT CHEST, ABDOMEN AND PELVIS WITHOUT CONTRAST TECHNIQUE: Multidetector CT imaging of the chest, abdomen and pelvis was performed following the standard protocol without IV contrast. RADIATION DOSE REDUCTION: This exam was performed according to the departmental dose-optimization program which includes automated exposure control, adjustment of the mA and/or kV according to patient size and/or use of iterative reconstruction technique. COMPARISON:  Chest CT without contrast 04/09/2021, CT abdomen pelvis with IV contrast 02/27/2019 FINDINGS: CT CHEST FINDINGS Cardiovascular: The cardiac size is upper-normal. Pulmonary veins are normal caliber and there is no significant pericardial effusion. There is heavy native three-vessel calcific CAD with sternotomy changes and old CABG. There is aortic  atherosclerosis without aneurysm. The pulmonary arteries are normal caliber. Mediastinum/Nodes: The lower poles of the thyroid gland, trachea, thoracic esophagus are unremarkable. Axillary spaces are clear. No intrathoracic adenopathy is seen. Lungs/Pleura: There is a moderate-sized left and small right layering pleural effusions, both increased. Adjacent consolidation or atelectasis in left lower lobe appears similar to the previous exam as well  as posterior atelectasis in the right lower lobe. Scar-like opacities are again seen in the right middle lobe with additional ground-glass pneumonitis or atelectasis posteriorly in the right upper lobe also unchanged in appearance. Left upper lobe is clear except for atelectasis in the lingular base. The central airways are clear. 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 Osteopenia. CT ABDOMEN PELVIS FINDINGS Hepatobiliary: 19 cm length mildly steatotic liver, otherwise unremarkable without contrast. Gallbladder and bile ducts are unremarkable. Pancreas: Partially atrophic, otherwise unremarkable without contrast. Spleen: Normal in size and noncontrast attenuation. Adrenals/Urinary Tract: There is no adrenal mass. Wedge-shaped scar-like defect noted in the left upper renal cortex, otherwise no focal abnormality in the unenhanced renal cortex is seen. Perinephric stranding is similar to prior study as well as trace perinephric fluid. There is no evidence of urinary stone or obstruction. The bladder wall is thickened but not fully distended and there is air in the bladder. Stomach/Bowel: No dilatation or wall thickening including the appendix. Scattered colonic diverticula without focal inflammation. Vascular/Lymphatic: Aortic atherosclerosis. No enlarged abdominal or pelvic lymph nodes. Reproductive: The uterus is intact. There are BTL changes and a subserosal 3.1 cm fundal fibroid again noted of the right. No adnexal mass is  seen. The endometrium is more prominent than usually seen at this age, measuring 8 mm. A similar finding was seen previously. Nonemergent ultrasound follow-up is recommended. Other: Small umbilical fat hernia. There is trace posterior deep pelvic ascites. There is no free air, hemorrhage or abscess. Musculoskeletal: Advanced facet hypertrophy lower lumbar spine with chronic grade 1 L4-5 degenerative anterolisthesis and acquired spinal stenosis. Similar severe spinal stenosis L3-4. No destructive bone lesion is seen. IMPRESSION: 1. Moderate left and small layering right pleural effusions, both increased from 5 days ago. Adjacent atelectasis or consolidation in the left lower lobe appears similar. 2. Resolution of small ground-glass infiltrates in the posterior left upper lobe previously with similar appearance of ground-glass atelectasis or infiltrates in the posterior right upper lobe. 3. Prior CABG, with upper-normal heart size, aortic and coronary artery atherosclerosis. 4. Cystitis versus bladder nondistention, with air in the bladder which could be due to instrumentation or infectious process. Correlate clinically with urinalysis. 5. Additional chronic findings in the abdomen and pelvis with no other new abnormality. 6. Prominent endometrium for age. A similar finding was noted previously. Nonemergent ultrasound follow-up recommended. 7. Trace ascites. 8. Osteopenia and degenerative change with severe acquired spinal stenosis L3-4 and L4-5. Electronically Signed   By: Telford Nab M.D.   On: 04/14/2021 03:32      PROCEDURES:  Critical Care performed: Yes, see critical care procedure note(s)  .Critical Care Performed by: Rudene Re, MD Authorized by: Rudene Re, MD   Critical care provider statement:    Critical care time (minutes):  40   Critical care time was exclusive of:  Separately billable procedures and treating other patients   Critical care was necessary to treat or  prevent imminent or life-threatening deterioration of the following conditions:  Cardiac failure, renal failure, respiratory failure, circulatory failure, CNS failure or compromise, sepsis and shock   Critical care was time spent personally by me on the following activities:  Development of treatment plan with patient or surrogate, discussions with consultants, evaluation of patient's response to treatment, examination of patient, ordering and review of laboratory studies, ordering and review of radiographic studies, ordering and performing treatments and interventions, pulse oximetry, re-evaluation of patient's condition and review of old charts  I assumed direction of critical care for this patient from another provider in my specialty: no     Care discussed with: admitting provider      IMPRESSION / MDM / [place] / ED COURSE  I reviewed the triage vital signs and the nursing notes.  66 y.o. female with a history of diabetes, hypertension, diastolic CHF, chronic kidney disease who presents from home for several complaints.  On exam she is hypertensive with BP of 208/78, low-grade temp of 99.39F, mild respiratory distress on 2 L satting 95%, lungs are clear, abdomen is nontender, trace pitting edema which is symmetric bilaterally  #SOB: CHF exacerbation, PNA, covid, flu, PE  #abd pain, nausea, diarrhea: viral syndrome, diverticulitis, colitis, UTI, pyelonephritis, C. diff  # L wrist pain: gout vs septic joint vs cellulitis vs fracture  # R AC redness/ swelling: Superficial phlebitis versus DVT   Plan: EKG, chest x-ray, COVID and flu, BNP, CBC, troponin, chemistry panel, Doppler study of the right upper extremity, x-ray of the left wrist, CT chest, abdomen, pelvis, urinalysis.  Will give IV Zofran for nausea. C diff test ordered/    MEDICATIONS GIVEN IN ED: Medications  cefTRIAXone (ROCEPHIN) 1 g in sodium chloride 0.9 % 100 mL IVPB (has no administration in time range)   acetaminophen (TYLENOL) tablet 650 mg (has no administration in time range)    Or  acetaminophen (TYLENOL) suppository 650 mg (has no administration in time range)  ondansetron (ZOFRAN) tablet 4 mg (has no administration in time range)    Or  ondansetron (ZOFRAN) injection 4 mg (has no administration in time range)  enoxaparin (LOVENOX) injection 40 mg (has no administration in time range)  insulin aspart (novoLOG) injection 0-5 Units (has no administration in time range)  insulin aspart (novoLOG) injection 0-20 Units (has no administration in time range)  HYDROcodone-acetaminophen (NORCO/VICODIN) 5-325 MG per tablet 1-2 tablet (has no administration in time range)  morphine (PF) 2 MG/ML injection 2 mg (has no administration in time range)  furosemide (LASIX) injection 20 mg (has no administration in time range)  ondansetron (ZOFRAN) injection 4 mg (4 mg Intravenous Given 04/14/21 0243)  furosemide (LASIX) injection 40 mg (40 mg Intravenous Given 04/14/21 0415)  cefTRIAXone (ROCEPHIN) 1 g in sodium chloride 0.9 % 100 mL IVPB (0 g Intravenous Stopped 04/14/21 0445)  oxyCODONE-acetaminophen (PERCOCET/ROXICET) 5-325 MG per tablet 1 tablet (1 tablet Oral Given 04/14/21 0415)     ED COURSE: Imaging consistent with CHF exacerbation with bilateral pleural effusions.  Patient given IV Lasix.  COVID and flu negative.  Kidney function is stable.  No significant electrolyte derangements.  UA is positive for UTI with no other acute findings in the CT abdomen pelvis.  Her pain is most likely from this.  Rocephin given.  We will hold off on tapping her wrist at this time in case there is a skin infection overlying the wrist.  We will see patient's response to Rocephin and consult Ortho in the morning.  Doppler study of the right [facility] showing superficial thrombosis with no signs of DVT.  Hospitalist service was consulted and after discussion accepted patient to their service   Consults: Hospitalist   EMR  reviewed records from patient's recent admission with discharge yesterday for acute on chronic kidney injury    FINAL CLINICAL IMPRESSION(S) / ED DIAGNOSES   Final diagnoses:  Arm swelling  Acute respiratory failure with hypoxia ([place])  Acute on chronic congestive heart failure, unspecified heart failure type ([place])  Superficial thrombophlebitis of right upper extremity  Pain and swelling of left wrist  Acute cystitis with hematuria  Diarrhea of presumed infectious origin     Rx / DC Orders   ED Discharge Orders     None        Note:  This document was prepared using Dragon voice recognition software and may include unintentional dictation errors.   Please note:  Patient was evaluated in Emergency Department today for the symptoms described in the history of present illness. Patient was evaluated in the context of the global COVID-19 pandemic, which necessitated consideration that the patient might be at risk for infection with the SARS-CoV-2 virus that causes COVID-19. Institutional protocols and algorithms that pertain to the evaluation of patients at risk for COVID-19 are in a state of rapid change based on information released by regulatory bodies including the CDC and federal and state organizations. These policies and algorithms were followed during the patient's care in the ED.  Some ED evaluations and interventions may be delayed as a result of limited staffing during the pandemic.       Alfred Levins, [place], MD 04/14/21 [phone]

## 2021-04-14 NOTE — Assessment & Plan Note (Addendum)
Currently kidney function at baseline

## 2021-04-14 NOTE — Hospital Course (Addendum)
Katrina Ortiz is a 66 y.o. female with medical history significant of Diastolic CHF, CAD with history of CABG, HTN, DM hospitalized from 2/17 to 2/19 with unresponsiveness related to hypoglycemia with blood sugar 28 as well as AKI, and hypoxia, resolving with D10 infusion who presents to the ED with nausea, shortness of breath, generalized weakness and also complains of pain and redness of the left wrist and the right antecubital area.  On presentation she was hypertensive.  Lab work showed leukocytosis, elevated BNP.  UA was suspicious for UTI.  Chest x-ray showed bilateral pleural effusion.  Right upper extremity venous Doppler negative for DVT.  Patient was started on ceftriaxone for the cellulitis of right upper extremity/UTI.  Cardiology consulted for  CHF exacerbation.  PT/OT consulted, recommended SNF on discharge.  Medically stable for discharge whenever possible

## 2021-04-14 NOTE — ED Notes (Signed)
Jarrett Soho RN aware of assigned bed

## 2021-04-14 NOTE — Assessment & Plan Note (Addendum)
Possible thrombophlebitis given location and right antecubital fossa, site of location of recent IV injection IV Rocephin Pain control Negative venous ultrasound of right upper extremity for DVT but showed occlusive cephalic vein thrombosis in the right antecubital region

## 2021-04-14 NOTE — Assessment & Plan Note (Addendum)
PT/OT recommending skilled nursing  facility on discharge

## 2021-04-14 NOTE — Assessment & Plan Note (Addendum)
Hyperglycemia likely secondary to steroids.  Continue long-acting and sliding scale

## 2021-04-14 NOTE — Assessment & Plan Note (Addendum)
Currently hemoglobin stable

## 2021-04-14 NOTE — Assessment & Plan Note (Addendum)
Elevated BNP.  Started on Lasix 20 mg IV twice daily,now chnaged to lasix 40 mg BID PO Continue home carvedilol.  Daily weights with intake and output monitoring Echocardiogram showed EF of 60 to 11%, grade 1 diastolic dysfunction.  Cardiology was following

## 2021-04-14 NOTE — ED Notes (Signed)
External catheter in place with new brief. Catheter connected to wall suction.

## 2021-04-14 NOTE — ED Notes (Signed)
Pt eating dinner tray °

## 2021-04-14 NOTE — ED Notes (Signed)
Pt reports feeling weak and nauseated.  Pt discharged from [facility] yesterday.  Pt reports feeling worse.  Pt has sob and is on oxygen .  No chest pain.  Pt also has redness to left wrist area.  No known injury.  Iv started. Labs sent.  Pt alert  nsr on monitor.

## 2021-04-14 NOTE — Assessment & Plan Note (Addendum)
UA was suspicious for UTI.  Urine culture shows pansensitive E. coli.  Was treated with cephalosporin.

## 2021-04-14 NOTE — Assessment & Plan Note (Signed)
Continue aspirin and statins Troponins negative, EKG nonacute and no complaints of chest pain

## 2021-04-14 NOTE — Progress Notes (Signed)
PHARMACIST - PHYSICIAN COMMUNICATION  CONCERNING:  Enoxaparin (Lovenox) for DVT Prophylaxis    RECOMMENDATION: Patient was prescribed enoxaprin 40mg  q24 hours for VTE prophylaxis.   Filed Weights   04/13/21 2347  Weight: 94.3 kg (208 lb)    Body mass index is 36.85 kg/m.  Estimated Creatinine Clearance: 26.1 mL/min (A) (by C-G formula based on SCr of 2.32 mg/dL (H)).  Patient is candidate for enoxaparin 40mg  every 24 hours based on CrCl <85ml/min with BMI > 30.  DESCRIPTION: Pharmacy has adjusted enoxaparin dose per  [facility] policy.  Patient is now receiving enoxaparin 40 mg every 24 hours   Renda Rolls, PharmD, [facility] 04/14/2021 4:38 AM

## 2021-04-14 NOTE — Progress Notes (Addendum)
PROGRESS NOTE  Katrina Ortiz  IWL:[id number] DOB: 1955/10/03 DOA: 04/14/2021 PCP: Lowella Bandy, MD   Brief Narrative: Katrina Ortiz is a 66 y.o. female with medical history significant of Diastolic CHF, CAD with history of CABG, HTN, DM hospitalized from 2/17 to 2/19 with unresponsiveness related to hypoglycemia with blood sugar 28 as well as AKI, and hypoxia, resolving with D10 infusion who presents to the ED with nausea, shortness of breath, generalized weakness and also complains of pain and redness of the left wrist and the right antecubital area.  On presentation she was hypertensive.  Lab work showed leukocytosis, elevated BNP.  UA was suspicious for UTI.  Chest x-ray showed bilateral pleural effusion.  Right upper extremity venous Doppler negative for DVT.  Patient was started on ceftriaxone for the cellulitis of right upper extremity,left wrist .  Cardiology consulted for new onset CHF exacerbation.  Assessment & Plan:  Principal Problem:   Acute on chronic diastolic CHF (congestive heart failure) (HCC) Active Problems:   Type 2 diabetes mellitus (HCC)   Cellulitis of left wrist   UTI (urinary tract infection)   Cellulitis of right forearm   CKD (chronic kidney disease) stage 4, GFR 15-29 ml/min (HCC)   Anemia of chronic kidney failure, stage 4 (severe) (HCC)   Hx of CABG   Physical deconditioning   Assessment and Plan: * Acute on chronic diastolic CHF (congestive heart failure) (HCC) Elevated BNP.  Started on Lasix 20 mg IV twice daily Continue home carvedilol.  Hold valsartan due to renal function Daily weights with intake and output monitoring Echocardiogram is pending Cardiology consulted  Physical deconditioning Might benefit from short-term rehab We will consult PT/OT.  Lives alone  Hx of CABG Continue aspirin and statins Troponins negative, EKG nonacute and no complaints of chest pain  Anemia of chronic kidney failure, stage 4 (severe) (HCC) At  baseline  CKD (chronic kidney disease) stage 4, GFR 15-29 ml/min (HCC) Appears to be at baseline  Cellulitis of right forearm Possible thrombophlebitis given location and right antecubital fossa, site of location of recent IV injection IV Rocephin Pain control Negative venous ultrasound of right upper extremity for DVT but showed occlusive cephalic vein thrombosis in the right antecubital region   UTI (urinary tract infection) UA was suspicious for UTI.  IV Rocephin and follow cultures  Cellulitis of left wrist Started on ceftriaxone. Unclear etiology.  Patient has history of psoriasis.  X-ray of the wrist did not show any fracture or dislocation but showed a small radiopaque in the soft tissue.  She has cellulitis changes on the right wrist with severe tenderness and edema.  We will check ESR, CRP, rheumatoid factor, ANA.  Uric acid level is normal.  We will start on prednisone.  Continue pain management, supportive care   Type 2 diabetes mellitus (HCC) Sliding scale insulin coverage.  Monitor blood sugars              DVT prophylaxis:enoxaparin (LOVENOX) injection 40 mg Start: 04/14/21 0800     Code Status: Full Code  Family Communication: None at bedside  Patient status:inpatient  Patient is from :home  Anticipated discharge RD:EYCX  Estimated DC date:2-3 days   Consultants: cardiology  Procedures:none  Antimicrobials:  Anti-infectives (From admission, onward)    Start     Dose/Rate Route Frequency Ordered Stop   04/15/21 0600  cefTRIAXone (ROCEPHIN) 1 g in sodium chloride 0.9 % 100 mL IVPB        1 g 200 mL/hr over  30 Minutes Intravenous Every 24 hours 04/14/21 0418     04/14/21 0345  cefTRIAXone (ROCEPHIN) 1 g in sodium chloride 0.9 % 100 mL IVPB        1 g 200 mL/hr over 30 Minutes Intravenous  Once 04/14/21 1025 04/14/21 0445       Subjective:  Patient seen and examined at the bedside this afternoon.  Hemodynamically stable.  She was crying  with pain left wrist.  Denies any shortness of breath or cough.  Objective: Vitals:   04/14/21 0832 04/14/21 0915 04/14/21 1045 04/14/21 1130  BP: 136/74 (!) 159/58 (!) 171/62 (!) 141/50  Pulse: 78 79 80 85  Resp: '17 16 17 15  ' Temp:      TempSrc:      SpO2: 97% 98% 99% 99%  Weight:      Height:       No intake or output data in the 24 hours ending 04/14/21 1506 Filed Weights   04/13/21 2347  Weight: 94.3 kg    Examination:  General exam: in moderate distress due to left wrist pain, obese HEENT: PERRL Respiratory system:  no wheezes or crackles  Cardiovascular system: S1 & S2 heard, RRR.  Gastrointestinal system: Abdomen is nondistended, soft and nontender. Central nervous system: Alert and oriented Extremities: No lower extremity edema, thrombophlebitis changes on the right cubital fossa, severely tender/erythematous/edematous right wrist Skin: No rashes, no ulcers,no icterus     Data Reviewed: I have personally reviewed following labs and imaging studies  CBC: Recent Labs  Lab 04/09/21 1328 04/10/21 0653 04/11/21 0640 04/12/21 0449 04/13/21 2354  WBC 7.4 7.3 7.4 7.3 11.3*  NEUTROABS 6.3  --   --   --   --   HGB 10.0* 8.3* 8.2* 8.7* 9.8*  HCT 33.1* 25.8* 26.2* 28.1* 30.4*  MCV 87.8 86.9 86.2 85.9 84.2  PLT 199 190 163 172 852   Basic Metabolic Panel: Recent Labs  Lab 04/09/21 1328 04/09/21 1641 04/09/21 2236 04/10/21 0653 04/11/21 0640 04/12/21 0449 04/13/21 2354 04/14/21 0240  NA 140  --   --  140 140 139 133*  --   K 5.5*   < > 5.4* 4.9 5.1 4.9 4.6  --   CL 111  --   --  113* 112* 109 103  --   CO2 21*  --   --  18* 19* 21* 20*  --   GLUCOSE 117*  --   --  216* 124* 118* 217*  --   BUN 59*  --   --  47* 41* 40* 36*  --   CREATININE 3.18*  --   --  2.69* 2.43* 2.36* 2.32* 2.29*  CALCIUM 8.0*  --   --  7.5* 8.1* 8.4* 8.4*  --    < > = values in this interval not displayed.     Recent Results (from the past 240 hour(s))  Resp Panel by RT-PCR  (Flu A&B, Covid) Nasopharyngeal Swab     Status: None   Collection Time: 04/14/21  2:40 AM   Specimen: Nasopharyngeal Swab; Nasopharyngeal(NP) swabs in vial transport medium  Result Value Ref Range Status   SARS Coronavirus 2 by RT PCR NEGATIVE NEGATIVE Final    Comment: (NOTE) SARS-CoV-2 target nucleic acids are NOT DETECTED.  The SARS-CoV-2 RNA is generally detectable in upper respiratory specimens during the acute phase of infection. The lowest concentration of SARS-CoV-2 viral copies this assay can detect is 138 copies/mL. A negative result does not preclude SARS-Cov-2 infection  and should not be used as the sole basis for treatment or other patient management decisions. A negative result may occur with  improper specimen collection/handling, submission of specimen other than nasopharyngeal swab, presence of viral mutation(s) within the areas targeted by this assay, and inadequate number of viral copies(<138 copies/mL). A negative result must be combined with clinical observations, patient history, and epidemiological information. The expected result is Negative.  Fact Sheet for Patients:  EntrepreneurPulse.com.au  Fact Sheet for Healthcare Providers:  IncredibleEmployment.be  This test is no t yet approved or cleared by the [place] FDA and  has been authorized for detection and/or diagnosis of SARS-CoV-2 by FDA under an Emergency Use Authorization (EUA). This EUA will remain  in effect (meaning this test can be used) for the duration of the COVID-19 declaration under Section 564(b)(1) of the Act, 21 U.S.C.section 360bbb-3(b)(1), unless the authorization is terminated  or revoked sooner.       Influenza A by PCR NEGATIVE NEGATIVE Final   Influenza B by PCR NEGATIVE NEGATIVE Final    Comment: (NOTE) The Xpert Xpress SARS-CoV-2/FLU/RSV plus assay is intended as an aid in the diagnosis of influenza from Nasopharyngeal swab specimens  and should not be used as a sole basis for treatment. Nasal washings and aspirates are unacceptable for Xpert Xpress SARS-CoV-2/FLU/RSV testing.  Fact Sheet for Patients: EntrepreneurPulse.com.au  Fact Sheet for Healthcare Providers: IncredibleEmployment.be  This test is not yet approved or cleared by the [place] FDA and has been authorized for detection and/or diagnosis of SARS-CoV-2 by FDA under an Emergency Use Authorization (EUA). This EUA will remain in effect (meaning this test can be used) for the duration of the COVID-19 declaration under Section 564(b)(1) of the Act, 21 U.S.C. section 360bbb-3(b)(1), unless the authorization is terminated or revoked.  Performed at [facility], [address], [place], [place] [address]      Radiology Studies: DG Chest 2 View  Result Date: 04/14/2021 CLINICAL DATA:  Dizziness and weakness. EXAM: CHEST - 2 VIEW COMPARISON:  April 09, 2021 FINDINGS: Multiple sternal wires are seen. Decreased lung volumes are noted with mild, diffuse, chronic appearing increased interstitial lung markings. Mild atelectasis is seen along the medial aspect of the left lung base. A very small left pleural effusion is noted. This is mildly decreased in size when compared to the prior exam. No pneumothorax is identified. The cardiac silhouette is mildly enlarged and unchanged in size. Moderate severity calcification of the aortic arch is noted. Degenerative changes are seen throughout the thoracic spine. IMPRESSION: 1. Evidence of prior median sternotomy/CABG. 2. Chronic appearing increased interstitial lung markings with mild left basilar atelectasis. 3. Small left pleural effusion, mildly decreased in size when compared to the prior study. Electronically Signed   By: Virgina Norfolk M.D.   On: 04/14/2021 00:22   DG Wrist Complete Left  Result Date: 04/14/2021 CLINICAL DATA:  Dizziness, weakness and left arm  pain. EXAM: LEFT WRIST - COMPLETE 3+ VIEW COMPARISON:  None. FINDINGS: There is no evidence of fracture or dislocation. Marked severity degenerative changes are seen involving the carpometacarpal articulation of the left thumb. There is mild diffuse soft tissue swelling with a small, thin linear radiopaque soft tissue foreign body seen within the soft tissues adjacent to the lateral aspect of the distal left radius. IMPRESSION: Marked severity degenerative changes, without evidence of an acute osseous abnormality. Electronically Signed   By: Virgina Norfolk M.D.   On: 04/14/2021 00:25   US Venous Img  Upper Uni Right(DVT)  Result Date: 04/14/2021 CLINICAL DATA:  Right arm swelling for 1 day EXAM: RIGHT UPPER EXTREMITY VENOUS DOPPLER ULTRASOUND TECHNIQUE: Gray-scale sonography with graded compression, as well as color Doppler and duplex ultrasound were performed to evaluate the upper extremity deep venous system from the level of the subclavian vein and including the jugular, axillary, basilic, radial, ulnar and upper cephalic vein. Spectral Doppler was utilized to evaluate flow at rest and with distal augmentation maneuvers. COMPARISON:  None. FINDINGS: Contralateral Subclavian Vein: Respiratory phasicity is normal and symmetric with the symptomatic side. No evidence of thrombus. Normal compressibility. Internal Jugular Vein: No evidence of thrombus. Subclavian Vein: No evidence of thrombus. Axillary Vein: No evidence of thrombus. Cephalic Vein: Occlusive thrombus at the level of the antecubital fossa involving a segment at least 3 cm in length. This is in the superficial system. Basilic Vein: No evidence of thrombus. Brachial Veins: No evidence of thrombus. Radial Veins: No evidence of thrombus. Ulnar Veins: No evidence of thrombus. Venous Reflux:  None visualized. IMPRESSION: Occlusive cephalic vein thrombosis in the right antecubital region, reportedly site of prior IV access. Electronically Signed   By:  Jorje Guild M.D.   On: 04/14/2021 04:24   CT CHEST ABDOMEN PELVIS WO CONTRAST  Result Date: 04/14/2021 CLINICAL DATA:  Shortness of breath, weakness and nausea. EXAM: CT CHEST, ABDOMEN AND PELVIS WITHOUT CONTRAST TECHNIQUE: Multidetector CT imaging of the chest, abdomen and pelvis was performed following the standard protocol without IV contrast. RADIATION DOSE REDUCTION: This exam was performed according to the departmental dose-optimization program which includes automated exposure control, adjustment of the mA and/or kV according to patient size and/or use of iterative reconstruction technique. COMPARISON:  Chest CT without contrast 04/09/2021, CT abdomen pelvis with IV contrast 02/27/2019 FINDINGS: CT CHEST FINDINGS Cardiovascular: The cardiac size is upper-normal. Pulmonary veins are normal caliber and there is no significant pericardial effusion. There is heavy native three-vessel calcific CAD with sternotomy changes and old CABG. There is aortic atherosclerosis without aneurysm. The pulmonary arteries are normal caliber. Mediastinum/Nodes: The lower poles of the thyroid gland, trachea, thoracic esophagus are unremarkable. Axillary spaces are clear. No intrathoracic adenopathy is seen. Lungs/Pleura: There is a moderate-sized left and small right layering pleural effusions, both increased. Adjacent consolidation or atelectasis in left lower lobe appears similar to the previous exam as well as posterior atelectasis in the right lower lobe. Scar-like opacities are again seen in the right middle lobe with additional ground-glass pneumonitis or atelectasis posteriorly in the right upper lobe also unchanged in appearance. Left upper lobe is clear except for atelectasis in the lingular base. The central airways are clear. 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 Osteopenia. CT ABDOMEN PELVIS FINDINGS Hepatobiliary: 19 cm length mildly steatotic liver, otherwise  unremarkable without contrast. Gallbladder and bile ducts are unremarkable. Pancreas: Partially atrophic, otherwise unremarkable without contrast. Spleen: Normal in size and noncontrast attenuation. Adrenals/Urinary Tract: There is no adrenal mass. Wedge-shaped scar-like defect noted in the left upper renal cortex, otherwise no focal abnormality in the unenhanced renal cortex is seen. Perinephric stranding is similar to prior study as well as trace perinephric fluid. There is no evidence of urinary stone or obstruction. The bladder wall is thickened but not fully distended and there is air in the bladder. Stomach/Bowel: No dilatation or wall thickening including the appendix. Scattered colonic diverticula without focal inflammation. Vascular/Lymphatic: Aortic atherosclerosis. No enlarged abdominal or pelvic lymph nodes. Reproductive: The uterus is intact. There  are BTL changes and a subserosal 3.1 cm fundal fibroid again noted of the right. No adnexal mass is seen. The endometrium is more prominent than usually seen at this age, measuring 8 mm. A similar finding was seen previously. Nonemergent ultrasound follow-up is recommended. Other: Small umbilical fat hernia. There is trace posterior deep pelvic ascites. There is no free air, hemorrhage or abscess. Musculoskeletal: Advanced facet hypertrophy lower lumbar spine with chronic grade 1 L4-5 degenerative anterolisthesis and acquired spinal stenosis. Similar severe spinal stenosis L3-4. No destructive bone lesion is seen. IMPRESSION: 1. Moderate left and small layering right pleural effusions, both increased from 5 days ago. Adjacent atelectasis or consolidation in the left lower lobe appears similar. 2. Resolution of small ground-glass infiltrates in the posterior left upper lobe previously with similar appearance of ground-glass atelectasis or infiltrates in the posterior right upper lobe. 3. Prior CABG, with upper-normal heart size, aortic and coronary artery  atherosclerosis. 4. Cystitis versus bladder nondistention, with air in the bladder which could be due to instrumentation or infectious process. Correlate clinically with urinalysis. 5. Additional chronic findings in the abdomen and pelvis with no other new abnormality. 6. Prominent endometrium for age. A similar finding was noted previously. Nonemergent ultrasound follow-up recommended. 7. Trace ascites. 8. Osteopenia and degenerative change with severe acquired spinal stenosis L3-4 and L4-5. Electronically Signed   By: Telford Nab M.D.   On: 04/14/2021 03:32    Scheduled Meds:  enoxaparin (LOVENOX) injection  40 mg Subcutaneous Q24H   furosemide  20 mg Intravenous BID   insulin aspart  0-20 Units Subcutaneous TID WC   insulin aspart  0-5 Units Subcutaneous QHS   predniSONE  50 mg Oral Q breakfast   Continuous Infusions:  [START ON 04/15/2021] cefTRIAXone (ROCEPHIN)  IV       LOS: 0 days   Shelly Coss, MD [facility] P2/22/2023, 3:06 PM

## 2021-04-15 ENCOUNTER — Inpatient Hospital Stay (HOSPITAL_COMMUNITY)
Admit: 2021-04-15 | Discharge: 2021-04-15 | Disposition: A | Discharge status: Home / Self Care | Payer: HMO | Attending: Cardiology | Admitting: Cardiology

## 2021-04-15 DIAGNOSIS — J9601 Acute respiratory failure with hypoxia: Secondary | ICD-10-CM | POA: Diagnosis not present

## 2021-04-15 DIAGNOSIS — I10 Essential (primary) hypertension: Secondary | ICD-10-CM

## 2021-04-15 DIAGNOSIS — D631 Anemia in chronic kidney disease: Secondary | ICD-10-CM

## 2021-04-15 DIAGNOSIS — M7989 Other specified soft tissue disorders: Secondary | ICD-10-CM

## 2021-04-15 DIAGNOSIS — I509 Heart failure, unspecified: Secondary | ICD-10-CM

## 2021-04-15 DIAGNOSIS — E1159 Type 2 diabetes mellitus with other circulatory complications: Secondary | ICD-10-CM

## 2021-04-15 DIAGNOSIS — N184 Chronic kidney disease, stage 4 (severe): Secondary | ICD-10-CM

## 2021-04-15 DIAGNOSIS — I5031 Acute diastolic (congestive) heart failure: Secondary | ICD-10-CM

## 2021-04-15 LAB — CBC WITH DIFFERENTIAL/PLATELET
Abs Immature Granulocytes: 0.17 10*3/uL — ABNORMAL HIGH (ref 0.00–0.07)
Basophils Absolute: 0 10*3/uL (ref 0.0–0.1)
Basophils Relative: 0 %
Eosinophils Absolute: 0 10*3/uL (ref 0.0–0.5)
Eosinophils Relative: 0 %
HCT: 31.1 % — ABNORMAL LOW (ref 36.0–46.0)
Hemoglobin: 9.8 g/dL — ABNORMAL LOW (ref 12.0–15.0)
Immature Granulocytes: 1 %
Lymphocytes Relative: 2 %
Lymphs Abs: 0.3 10*3/uL — ABNORMAL LOW (ref 0.7–4.0)
MCH: 26.9 pg (ref 26.0–34.0)
MCHC: 31.5 g/dL (ref 30.0–36.0)
MCV: 85.4 fL (ref 80.0–100.0)
Monocytes Absolute: 0.4 10*3/uL (ref 0.1–1.0)
Monocytes Relative: 3 %
Neutro Abs: 14.4 10*3/uL — ABNORMAL HIGH (ref 1.7–7.7)
Neutrophils Relative %: 94 %
Platelets: 144 10*3/uL — ABNORMAL LOW (ref 150–400)
RBC: 3.64 MIL/uL — ABNORMAL LOW (ref 3.87–5.11)
RDW: 15 % (ref 11.5–15.5)
WBC: 15.4 10*3/uL — ABNORMAL HIGH (ref 4.0–10.5)
nRBC: 0 % (ref 0.0–0.2)

## 2021-04-15 LAB — C-REACTIVE PROTEIN: CRP: 20.3 mg/dL — ABNORMAL HIGH (ref <1.0)

## 2021-04-15 LAB — BASIC METABOLIC PANEL
Anion gap: 13 (ref 5–15)
BUN: 40 mg/dL — ABNORMAL HIGH (ref 8–23)
CO2: 17 mmol/L — ABNORMAL LOW (ref 22–32)
Calcium: 8.2 mg/dL — ABNORMAL LOW (ref 8.9–10.3)
Chloride: 104 mmol/L (ref 98–111)
Creatinine, Ser: 2.38 mg/dL — ABNORMAL HIGH (ref 0.44–1.00)
GFR, Estimated: 22 mL/min — ABNORMAL LOW (ref >60)
Glucose, Bld: 245 mg/dL — ABNORMAL HIGH (ref 70–99)
Potassium: 4.8 mmol/L (ref 3.5–5.1)
Sodium: 134 mmol/L — ABNORMAL LOW (ref 135–145)

## 2021-04-15 LAB — ECHOCARDIOGRAM COMPLETE
Area-P 1/2: 4.39 cm2
S' Lateral: 3 cm
Weight: 3340.41 oz

## 2021-04-15 LAB — GLUCOSE, CAPILLARY
Glucose-Capillary: 203 mg/dL — ABNORMAL HIGH (ref 70–99)
Glucose-Capillary: 287 mg/dL — ABNORMAL HIGH (ref 70–99)
Glucose-Capillary: 277 mg/dL — ABNORMAL HIGH (ref 70–99)
Glucose-Capillary: 260 mg/dL — ABNORMAL HIGH (ref 70–99)

## 2021-04-15 MED ORDER — FUROSEMIDE 40 MG PO TABS
40.0000 mg | ORAL_TABLET | Freq: Two times a day (BID) | ORAL | Status: DC
  Administered 2021-04-15 – 2021-04-20 (×10): 40 mg via ORAL
  Filled 2021-04-15 (×10): qty 1

## 2021-04-15 MED ORDER — CLOPIDOGREL BISULFATE 75 MG PO TABS: 75.0000 mg | ORAL_TABLET | Freq: Every day | ORAL | Status: DC

## 2021-04-15 MED ORDER — PRAVASTATIN SODIUM 40 MG PO TABS
80.0000 mg | ORAL_TABLET | Freq: Every day | ORAL | Status: DC
  Administered 2021-04-15 – 2021-04-20 (×6): 80 mg via ORAL
  Filled 2021-04-15 (×6): qty 2

## 2021-04-15 MED ORDER — GABAPENTIN 300 MG PO CAPS
300.0000 mg | ORAL_CAPSULE | Freq: Every day | ORAL | Status: DC
  Administered 2021-04-15 – 2021-04-19 (×5): 300 mg via ORAL
  Filled 2021-04-15 (×5): qty 1

## 2021-04-15 MED ORDER — PANTOPRAZOLE SODIUM 40 MG PO TBEC
40.0000 mg | DELAYED_RELEASE_TABLET | Freq: Every day | ORAL | Status: DC
  Administered 2021-04-15 – 2021-04-20 (×6): 40 mg via ORAL
  Filled 2021-04-15 (×6): qty 1

## 2021-04-15 MED ORDER — PERFLUTREN LIPID MICROSPHERE
1.0000 mL | INTRAVENOUS | Status: AC | PRN
  Administered 2021-04-15: 2 mL via INTRAVENOUS
  Filled 2021-04-15: qty 10

## 2021-04-15 MED ORDER — CLOPIDOGREL BISULFATE 75 MG PO TABS
75.0000 mg | ORAL_TABLET | Freq: Every day | ORAL | Status: DC
  Administered 2021-04-15 – 2021-04-20 (×6): 75 mg via ORAL
  Filled 2021-04-15 (×6): qty 1

## 2021-04-15 MED ORDER — HYDRALAZINE HCL 25 MG PO TABS
25.0000 mg | ORAL_TABLET | Freq: Three times a day (TID) | ORAL | Status: DC
  Administered 2021-04-15 – 2021-04-20 (×16): 25 mg via ORAL
  Filled 2021-04-15 (×16): qty 1

## 2021-04-15 MED ORDER — DULOXETINE HCL 30 MG PO CPEP
60.0000 mg | ORAL_CAPSULE | Freq: Every day | ORAL | Status: DC
Start: 2021-04-15 — End: 2021-04-20
  Administered 2021-04-15 – 2021-04-20 (×6): 60 mg via ORAL
  Filled 2021-04-15 (×6): qty 2

## 2021-04-15 MED ORDER — AMLODIPINE BESYLATE 10 MG PO TABS
10.0000 mg | ORAL_TABLET | Freq: Every day | ORAL | Status: DC
Start: 2021-04-15 — End: 2021-04-20
  Administered 2021-04-15 – 2021-04-20 (×6): 10 mg via ORAL
  Filled 2021-04-15 (×6): qty 1

## 2021-04-15 MED ORDER — CARVEDILOL 25 MG PO TABS
25.0000 mg | ORAL_TABLET | Freq: Two times a day (BID) | ORAL | Status: DC
  Administered 2021-04-15 – 2021-04-20 (×11): 25 mg via ORAL
  Filled 2021-04-15 (×11): qty 1

## 2021-04-15 NOTE — Plan of Care (Signed)
°  Problem: Education: Goal: Knowledge of General Education information will improve Description: Including pain rating scale, medication(s)/side effects and non-pharmacologic comfort measures 04/15/2021 1451 by Emmaline Life, RN Outcome: Progressing 04/15/2021 1450 by Emmaline Life, RN Outcome: Progressing   Problem: Health Behavior/Discharge Planning: Goal: Ability to manage health-related needs will improve 04/15/2021 1451 by Emmaline Life, RN Outcome: Progressing 04/15/2021 1450 by Emmaline Life, RN Outcome: Progressing   Problem: Clinical Measurements: Goal: Ability to maintain clinical measurements within normal limits will improve Outcome: Progressing

## 2021-04-15 NOTE — Progress Notes (Signed)
Cardiology Progress Note   Patient Name: Katrina Ortiz Date of Encounter: 04/15/2021  Primary Cardiologist: [facility]  Subjective   Breathing at baseline.  No chest pain.  Edema has resolved.   Inpatient Medications    Scheduled Meds:  amLODipine  10 mg Oral Daily   carvedilol  25 mg Oral BID   DULoxetine  60 mg Oral Daily   enoxaparin (LOVENOX) injection  40 mg Subcutaneous Q24H   furosemide  20 mg Intravenous BID   gabapentin  300 mg Oral QHS   insulin aspart  0-20 Units Subcutaneous TID WC   insulin aspart  0-5 Units Subcutaneous QHS   pantoprazole  40 mg Oral Daily   pravastatin  80 mg Oral Daily   predniSONE  50 mg Oral Q breakfast   Continuous Infusions:  cefTRIAXone (ROCEPHIN)  IV 1 g (04/15/21 0522)   PRN Meds: acetaminophen **OR** acetaminophen, hydrALAZINE, HYDROcodone-acetaminophen, HYDROmorphone (DILAUDID) injection, ondansetron **OR** ondansetron (ZOFRAN) IV, oxyCODONE-acetaminophen   Vital Signs    Vitals:   04/15/21 0302 04/15/21 0500 04/15/21 0802 04/15/21 1106  BP: (!) 165/68  (!) 187/77 (!) 169/67  Pulse: 87  95 96  Resp: 18     Temp: 98 F (36.7 C)  (!) 97.5 F (36.4 C) 97.9 F (36.6 C)  TempSrc:    Oral  SpO2: 100%  100% 99%  Weight:  94.7 kg    Height:        Intake/Output Summary (Last 24 hours) at 04/15/2021 1211 Last data filed at 04/15/2021 0856 Gross per 24 hour  Intake --  Output 1450 ml  Net -1450 ml   Filed Weights   04/13/21 2347 04/15/21 0500  Weight: 94.3 kg 94.7 kg    Physical Exam   GEN: Obese, in no acute distress.  HEENT: Grossly normal.  Neck: Supple, obese, difficult to gauge JVP.  No carotid bruits, or masses. Cardiac: RRR, no murmurs, rubs, or gallops. No clubbing, cyanosis, edema.  Radials 2+, DP/PT 2+ and equal bilaterally.  Respiratory:  Respirations regular and unlabored, clear to auscultation bilaterally. GI: Obese, soft, nontender, nondistended, BS + x 4. MS: no deformity or atrophy. Skin: warm and dry, no  rash. Neuro:  Strength and sensation are intact. Psych: AAOx3.  Normal affect.  Labs    Chemistry Recent Labs  Lab 04/12/21 0449 04/13/21 2354 04/14/21 0240 04/15/21 0608  NA 139 133*  --  134*  K 4.9 4.6  --  4.8  CL 109 103  --  104  CO2 21* 20*  --  17*  GLUCOSE 118* 217*  --  245*  BUN 40* 36*  --  40*  CREATININE 2.36* 2.32* 2.29* 2.38*  CALCIUM 8.4* 8.4*  --  8.2*  GFRNONAA 22* 23* 23* 22*  ANIONGAP 9 10  --  13     Hematology Recent Labs  Lab 04/12/21 0449 04/13/21 2354 04/15/21 0608  WBC 7.3 11.3* 15.4*  RBC 3.27* 3.61* 3.64*  HGB 8.7* 9.8* 9.8*  HCT 28.1* 30.4* 31.1*  MCV 85.9 84.2 85.4  MCH 26.6 27.1 26.9  MCHC 31.0 32.2 31.5  RDW 15.0 14.8 15.0  PLT 172 161 144*    Cardiac Enzymes  Recent Labs  Lab 04/09/21 1328 04/09/21 1839 04/13/21 2354 04/14/21 0240  TROPONINIHS 4 4 9 11       BNP Recent Labs  Lab 04/13/21 2354  BNP 505.2*   HbA1c  Lab Results  Component Value Date   HGBA1C 9.9 (H) 04/11/2019  Radiology    DG Chest 2 View  Result Date: 04/14/2021 CLINICAL DATA:  Dizziness and weakness. EXAM: CHEST - 2 VIEW COMPARISON:  April 09, 2021 FINDINGS: Multiple sternal wires are seen. Decreased lung volumes are noted with mild, diffuse, chronic appearing increased interstitial lung markings. Mild atelectasis is seen along the medial aspect of the left lung base. A very small left pleural effusion is noted. This is mildly decreased in size when compared to the prior exam. No pneumothorax is identified. The cardiac silhouette is mildly enlarged and unchanged in size. Moderate severity calcification of the aortic arch is noted. Degenerative changes are seen throughout the thoracic spine. IMPRESSION: 1. Evidence of prior median sternotomy/CABG. 2. Chronic appearing increased interstitial lung markings with mild left basilar atelectasis. 3. Small left pleural effusion, mildly decreased in size when compared to the prior study. Electronically  Signed   By: Virgina Norfolk M.D.   On: 04/14/2021 00:22   DG Wrist Complete Left  Result Date: 04/14/2021 CLINICAL DATA:  Dizziness, weakness and left arm pain. EXAM: LEFT WRIST - COMPLETE 3+ VIEW COMPARISON:  None. FINDINGS: There is no evidence of fracture or dislocation. Marked severity degenerative changes are seen involving the carpometacarpal articulation of the left thumb. There is mild diffuse soft tissue swelling with a small, thin linear radiopaque soft tissue foreign body seen within the soft tissues adjacent to the lateral aspect of the distal left radius. IMPRESSION: Marked severity degenerative changes, without evidence of an acute osseous abnormality. Electronically Signed   By: Virgina Norfolk M.D.   On: 04/14/2021 00:25   DG Abd 1 View  Result Date: 04/11/2021 CLINICAL DATA:  Hypoxia and hypoglycemia.  Nausea. EXAM: ABDOMEN - 1 VIEW COMPARISON:  None. FINDINGS: Bowel gas pattern is normal. No evidence of ileus or obstruction. No abnormal calcifications. Tubal ligation clips in the pelvis. Chronic degenerative changes of the lumbar spine. IMPRESSION: No acute or significant finding. Electronically Signed   By: Nelson Chimes M.D.   On: 04/11/2021 15:55   US Venous Img Upper Uni Right(DVT)  Result Date: 04/14/2021 CLINICAL DATA:  Right arm swelling for 1 day EXAM: RIGHT UPPER EXTREMITY VENOUS DOPPLER ULTRASOUND TECHNIQUE: Gray-scale sonography with graded compression, as well as color Doppler and duplex ultrasound were performed to evaluate the upper extremity deep venous system from the level of the subclavian vein and including the jugular, axillary, basilic, radial, ulnar and upper cephalic vein. Spectral Doppler was utilized to evaluate flow at rest and with distal augmentation maneuvers. COMPARISON:  None. FINDINGS: Contralateral Subclavian Vein: Respiratory phasicity is normal and symmetric with the symptomatic side. No evidence of thrombus. Normal compressibility. Internal  Jugular Vein: No evidence of thrombus. Subclavian Vein: No evidence of thrombus. Axillary Vein: No evidence of thrombus. Cephalic Vein: Occlusive thrombus at the level of the antecubital fossa involving a segment at least 3 cm in length. This is in the superficial system. Basilic Vein: No evidence of thrombus. Brachial Veins: No evidence of thrombus. Radial Veins: No evidence of thrombus. Ulnar Veins: No evidence of thrombus. Venous Reflux:  None visualized. IMPRESSION: Occlusive cephalic vein thrombosis in the right antecubital region, reportedly site of prior IV access. Electronically Signed   By: Jorje Guild M.D.   On: 04/14/2021 04:24   CT CHEST ABDOMEN PELVIS WO CONTRAST  Result Date: 04/14/2021 CLINICAL DATA:  Shortness of breath, weakness and nausea. EXAM: CT CHEST, ABDOMEN AND PELVIS WITHOUT CONTRAST TECHNIQUE: Multidetector CT imaging of the chest, abdomen and pelvis was performed following  the standard protocol without IV contrast. RADIATION DOSE REDUCTION: This exam was performed according to the departmental dose-optimization program which includes automated exposure control, adjustment of the mA and/or kV according to patient size and/or use of iterative reconstruction technique. COMPARISON:  Chest CT without contrast 04/09/2021, CT abdomen pelvis with IV contrast 02/27/2019 FINDINGS: CT CHEST FINDINGS Cardiovascular: The cardiac size is upper-normal. Pulmonary veins are normal caliber and there is no significant pericardial effusion. There is heavy native three-vessel calcific CAD with sternotomy changes and old CABG. There is aortic atherosclerosis without aneurysm. The pulmonary arteries are normal caliber. Mediastinum/Nodes: The lower poles of the thyroid gland, trachea, thoracic esophagus are unremarkable. Axillary spaces are clear. No intrathoracic adenopathy is seen. Lungs/Pleura: There is a moderate-sized left and small right layering pleural effusions, both increased. Adjacent  consolidation or atelectasis in left lower lobe appears similar to the previous exam as well as posterior atelectasis in the right lower lobe. Scar-like opacities are again seen in the right middle lobe with additional ground-glass pneumonitis or atelectasis posteriorly in the right upper lobe also unchanged in appearance. Left upper lobe is clear except for atelectasis in the lingular base. The central airways are clear. 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 Osteopenia. CT ABDOMEN PELVIS FINDINGS Hepatobiliary: 19 cm length mildly steatotic liver, otherwise unremarkable without contrast. Gallbladder and bile ducts are unremarkable. Pancreas: Partially atrophic, otherwise unremarkable without contrast. Spleen: Normal in size and noncontrast attenuation. Adrenals/Urinary Tract: There is no adrenal mass. Wedge-shaped scar-like defect noted in the left upper renal cortex, otherwise no focal abnormality in the unenhanced renal cortex is seen. Perinephric stranding is similar to prior study as well as trace perinephric fluid. There is no evidence of urinary stone or obstruction. The bladder wall is thickened but not fully distended and there is air in the bladder. Stomach/Bowel: No dilatation or wall thickening including the appendix. Scattered colonic diverticula without focal inflammation. Vascular/Lymphatic: Aortic atherosclerosis. No enlarged abdominal or pelvic lymph nodes. Reproductive: The uterus is intact. There are BTL changes and a subserosal 3.1 cm fundal fibroid again noted of the right. No adnexal mass is seen. The endometrium is more prominent than usually seen at this age, measuring 8 mm. A similar finding was seen previously. Nonemergent ultrasound follow-up is recommended. Other: Small umbilical fat hernia. There is trace posterior deep pelvic ascites. There is no free air, hemorrhage or abscess. Musculoskeletal: Advanced facet hypertrophy lower lumbar  spine with chronic grade 1 L4-5 degenerative anterolisthesis and acquired spinal stenosis. Similar severe spinal stenosis L3-4. No destructive bone lesion is seen. IMPRESSION: 1. Moderate left and small layering right pleural effusions, both increased from 5 days ago. Adjacent atelectasis or consolidation in the left lower lobe appears similar. 2. Resolution of small ground-glass infiltrates in the posterior left upper lobe previously with similar appearance of ground-glass atelectasis or infiltrates in the posterior right upper lobe. 3. Prior CABG, with upper-normal heart size, aortic and coronary artery atherosclerosis. 4. Cystitis versus bladder nondistention, with air in the bladder which could be due to instrumentation or infectious process. Correlate clinically with urinalysis. 5. Additional chronic findings in the abdomen and pelvis with no other new abnormality. 6. Prominent endometrium for age. A similar finding was noted previously. Nonemergent ultrasound follow-up recommended. 7. Trace ascites. 8. Osteopenia and degenerative change with severe acquired spinal stenosis L3-4 and L4-5. Electronically Signed   By: Telford Nab M.D.   On: 04/14/2021 03:32   US ABDOMEN LIMITED RUQ (LIVER/GB)  Result Date: 04/11/2021 CLINICAL DATA:  66 year old female with nausea and vomiting for 1 day EXAM: ULTRASOUND ABDOMEN LIMITED RIGHT UPPER QUADRANT COMPARISON:  02/27/2019 CT. FINDINGS: Gallbladder: A few mobile gallstones are noted, the largest measuring 5 mm. There is no evidence of gallbladder wall thickening, pericholecystic fluid or sonographic Murphy sign. Common bile duct: Diameter: 3.2 mm. No intrahepatic or extrahepatic biliary dilatation identified. Liver: No focal lesion identified. Within normal limits in parenchymal echogenicity. Portal vein is patent on color Doppler imaging with normal direction of blood flow towards the liver. Other: None. IMPRESSION: 1. Cholelithiasis without evidence of acute  cholecystitis. 2. No other significant abnormalities.  No biliary dilatation. Electronically Signed   By: Margarette Canada M.D.   On: 04/11/2021 18:11    Telemetry    RSR - Personally Reviewed  Cardiac Studies   2D Echocardiogram 02.2021    1. Left ventricular ejection fraction, by estimation, is 60 to 65%. The  left ventricle has normal function. The left ventricle has no regional  wall motion abnormalities. Left ventricular diastolic parameters are  consistent with Grade I diastolic  dysfunction (impaired relaxation).   2. Right ventricular systolic function is normal. The right ventricular  size is normal. Tricuspid regurgitation signal is inadequate for assessing  PA pressure.   Patient Profile     66 y.o. female w/ a h/o CAD s/p CABG, HFpEF, HTN, HL, CKD IV, and DMII, who was recently admitted 2/2 hypoglycemia/unresponsiveness/AKI/hypoxia (discharged 2/19), who presented 2/22 w/ nausea, wkns, dyspnea, and bilat arm pain/redness, found to have BNP of 505, nl HsTrops, and pleural effusions.   Assessment & Plan    1.  Acute on chronic HFpEF:  Admitted w/ dyspnea and edema.  I/O inaccurate (no intake recorded), but fair UO of 1.4L.  BUN/Creat up slightly @ 40/2.38.  Exam challenging due to body habitus, but no further lower ext edema.  Will d/c IV lasix and place her back on 40 PO BID.  BP is poorly controlled.  She is on max dose carvedilol and amlodipine.  She was prev on valsartan 40 daily, but this was d/c'd 2/17 in the setting of AKI.  Review of historical creatinines in [facility] records, shows that she's currently around her baseline dating back to 09/2020.  For now, I'll add hydralazine.  Can consider resumption of valsartan w/ close monitoring - defer to outpt nephrologist.   2.  Cellulitis Bilat upper ext:  Abx per IM.  3.  CAD:  s/p prior CABG.  No chest pain.  HsTrops nl.  Cont med rx - ? blocker and statin. Was also on plavix @ home - resume.  4.  Essential HTN:  BPs elevated on ?  blocker and ccb.  Was on valsartan @ home - d/c'd last week in setting of AKI.  Will add hydralazine.  Consider resumption of ARB for renal protection - defer to outpt nephrologist.  5.  HL:  On pravastatin.  6.  CKD IV:  As above, creat relatively stable.  Signed, Murray Hodgkins, NP  04/15/2021, 12:11 PM    For questions or updates, please contact   Please consult [email or web address] for contact info under Cardiology/STEMI.

## 2021-04-15 NOTE — Consult Note (Addendum)
° °  Heart Failure Nurse Navigator Note  HFpEF 60 to 65%.  Grade 1 diastolic dysfunction.  Mild mitral regurgitation.  She presented to the emergency room with complaints of dizziness, weakness and shortness of breath.  Chest x-ray revealed bilateral pleural effusions.  BNP was 505.  Comorbidities:  Chronic kidney disease stage IV Type 2 diabetes Coronary artery disease with coronary artery bypass grafting  Hypertension  Medications:  Amlodipine 10 mg daily Carvedilol 25 mg 2 times a day Plavix 75 mg daily Furosemide 20 mg IV 2 times a day Hydralazine 25 mg every 8 hours Pravastatin 80 mg daily  Labs:  Sodium 134, potassium 4.8, chloride 104, CO2 17, BUN 40, creatinine 2.38, GFR 22 Weight is 94.7,  blood pressure 169/67 Intake not documented Output at 1000 mL   Initial meeting with patient today she was lying in bed in no acute distress.  She states that she lives by herself and prepares her own meals.  She states that she has a scale but she does not weigh herself daily.  Discussed the importance of daily weights and recording and what to report to physician.  Also went over signs and symptoms to report to physician.  She states that she has not used salt at the table for a long time.  And tries to pick low sodium foods.  Also talked about fluid restriction and the importance of following.  Also discussed the importance of monitoring her blood pressure, she states that she does not have a blood pressure cuff but she is willing to purchase 1.  She states that she is compliant with her medications.  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  She was given the living with heart failure teaching booklet, along with the zone magnet and information on low-sodium and a weight chart.  She has an appointment in the outpatient heart failure clinic on March 3 at 12:00.  She has a 9% ratio of no-show.  Which is 1 out of 11 appointments.  Pricilla Riffle RN [facility]

## 2021-04-15 NOTE — Evaluation (Signed)
Physical Therapy Evaluation Patient Details Name: Katrina Ortiz MRN: [id number] DOB: 1955-06-30 Today's Date: 04/15/2021  History of Present Illness  Katrina Ortiz is a 66 y.o. female with medical history significant of Diastolic CHF, CAD with history of CABG, HTN, DM hospitalized from 2/17 to 2/19 with unresponsiveness related to hypoglycemia, AKI, and hypoxia. Pt presents to the ED with nausea, shortness of breath, generalized weakness and also complains of pain and redness of the left wrist and the right antecubital area. Negative venous ultrasound of right upper extremity for DVT but showed occlusive cephalic vein thrombosis in the right antecubital region.   Clinical Impression  Patient received in bed, she is agreeable to PT/OR session. Patient reports left wrist pain and swelling. Minimal use of this hand. Patient required min/mod assist for supine to sit. Min +2 assist for sit to stand and min/mod A for ambulation of 20 feet with B UE support. Patient will benefit from continued skilled PT while here to improve functional independence and safety. She may benefit from short stay and SNF until wrist pain is improved.         Recommendations for follow up therapy are one component of a multi-disciplinary discharge planning process, led by the attending physician.  Recommendations may be updated based on patient status, additional functional criteria and insurance authorization.  Follow Up Recommendations Skilled nursing-short term rehab (<3 hours/day)    Assistance Recommended at Discharge    Patient can return home with the following  A lot of help with walking and/or transfers;A lot of help with bathing/dressing/bathroom;Assistance with feeding;Assistance with cooking/housework;Help with stairs or ramp for entrance    Equipment Recommendations Other (comment) (left platform for rolling walker)  Recommendations for Other Services       Functional Status Assessment Patient has had a  recent decline in their functional status and demonstrates the ability to make significant improvements in function in a reasonable and predictable amount of time.     Precautions / Restrictions Precautions Precautions: Fall Restrictions Weight Bearing Restrictions: No Other Position/Activity Restrictions: L wrist treated NWBing for comfort      Mobility  Bed Mobility Overal bed mobility: Needs Assistance Bed Mobility: Supine to Sit     Supine to sit: Mod assist     General bed mobility comments: patient required mod assist for raising trunk to upright    Transfers Overall transfer level: Needs assistance Equipment used: 2 person hand held assist Transfers: Sit to/from Stand Sit to Stand: Min assist, +2 safety/equipment, +2 physical assistance                Ambulation/Gait Ambulation/Gait assistance: Min assist, +2 safety/equipment, +2 physical assistance Gait Distance (Feet): 20 Feet Assistive device: 2 person hand held assist Gait Pattern/deviations: Step-through pattern, Decreased step length - right, Decreased step length - left, Decreased stride length, Staggering left, Staggering right Gait velocity: decreased     General Gait Details: patient having difficulty to ambulate without walker. Will try left platform walker next session if wrist still hurting.  Stairs            Wheelchair Mobility    Modified Rankin (Stroke Patients Only)       Balance Overall balance assessment: Needs assistance Sitting-balance support: Feet supported Sitting balance-Leahy Scale: Good     Standing balance support: Bilateral upper extremity supported, During functional activity Standing balance-Leahy Scale: Fair  Pertinent Vitals/Pain Pain Assessment Pain Assessment: Faces Faces Pain Scale: Hurts even more Pain Location: L wrist Pain Descriptors / Indicators: Aching, Discomfort, Dull, Grimacing Pain  Intervention(s): Monitored during session    Home Living Family/patient expects to be discharged to:: Private residence Living Arrangements: Alone Available Help at Discharge: Friend(s);Available PRN/intermittently Type of Home: House Home Access: Ramped entrance       Home Layout: One level Home Equipment: Conservation officer, nature (2 wheels)      Prior Function Prior Level of Function : Independent/Modified Independent;Driving             Mobility Comments: RW household mobility, uses grocery store motorozied carts for shopping       Hand Dominance   Dominant Hand: Right    Extremity/Trunk Assessment   Upper Extremity Assessment Upper Extremity Assessment: LUE deficits/detail LUE Deficits / Details: elbow/shoulder WFL. wrist flexion/extension, composite digit flexion, and supination limited by pain LUE: Unable to fully assess due to pain    Lower Extremity Assessment Lower Extremity Assessment: Overall WFL for tasks assessed    Cervical / Trunk Assessment Cervical / Trunk Assessment: Normal  Communication   Communication: No difficulties  Cognition Arousal/Alertness: Awake/alert Behavior During Therapy: WFL for tasks assessed/performed Overall Cognitive Status: Within Functional Limits for tasks assessed                                          General Comments General comments (skin integrity, edema, etc.): SpO2 90s t/o session on 2L [place]    Exercises     Assessment/Plan    PT Assessment Patient needs continued PT services  PT Problem List Decreased strength;Decreased mobility;Decreased activity tolerance;Decreased balance;Pain       PT Treatment Interventions DME instruction;Therapeutic exercise;Gait training;Functional mobility training;Therapeutic activities;Patient/family education;Balance training    PT Goals (Current goals can be found in the Care Plan section)  Acute Rehab PT Goals Patient Stated Goal: to improve wrist pain, return  home if able PT Goal Formulation: With patient Time For Goal Achievement: 04/29/21 Potential to Achieve Goals: Fair    Frequency Min 2X/week     Co-evaluation PT/OT/SLP Co-Evaluation/Treatment: Yes Reason for Co-Treatment: For patient/therapist safety;To address functional/ADL transfers PT goals addressed during session: Mobility/safety with mobility;Balance OT goals addressed during session: ADL's and self-care       AM-PAC PT "6 Clicks" Mobility  Outcome Measure Help needed turning from your back to your side while in a flat bed without using bedrails?: A Little Help needed moving from lying on your back to sitting on the side of a flat bed without using bedrails?: A Little Help needed moving to and from a bed to a chair (including a wheelchair)?: A Little Help needed standing up from a chair using your arms (e.g., wheelchair or bedside chair)?: A Little Help needed to walk in hospital room?: A Lot Help needed climbing 3-5 steps with a railing? : A Lot 6 Click Score: 16    End of Session Equipment Utilized During Treatment: Oxygen Activity Tolerance: Patient limited by fatigue;Patient limited by pain Patient left: in bed;with call bell/phone within reach;with nursing/sitter in room (OT present) Nurse Communication: Mobility status PT Visit Diagnosis: Unsteadiness on feet (R26.81);Other abnormalities of gait and mobility (R26.89);Muscle weakness (generalized) (M62.81);Pain;Difficulty in walking, not elsewhere classified (R26.2) Pain - Right/Left: Left Pain - part of body: Hand    Time: 9211-9417 PT Time Calculation (min) (ACUTE  ONLY): 17 min   Charges:   PT Evaluation $PT Eval Moderate Complexity: 1 Mod          Doriann Zuch, PT, GCS 04/15/21,3:26 PM

## 2021-04-15 NOTE — Progress Notes (Signed)
PROGRESS NOTE  Katrina Ortiz  BWI:[id number] DOB: 04-08-1955 DOA: 04/14/2021 PCP: Lowella Bandy, MD   Brief Narrative: Katrina Ortiz is a 66 y.o. female with medical history significant of Diastolic CHF, CAD with history of CABG, HTN, DM hospitalized from 2/17 to 2/19 with unresponsiveness related to hypoglycemia with blood sugar 28 as well as AKI, and hypoxia, resolving with D10 infusion who presents to the ED with nausea, shortness of breath, generalized weakness and also complains of pain and redness of the left wrist and the right antecubital area.  On presentation she was hypertensive.  Lab work showed leukocytosis, elevated BNP.  UA was suspicious for UTI.  Chest x-ray showed bilateral pleural effusion.  Right upper extremity venous Doppler negative for DVT.  Patient was started on ceftriaxone for the cellulitis of right upper extremity,left wrist .  Cardiology consulted for new onset CHF exacerbation.  Assessment & Plan:  Principal Problem:   Acute on chronic diastolic CHF (congestive heart failure) (HCC) Active Problems:   Type 2 diabetes mellitus (HCC)   Cellulitis of left wrist   UTI (urinary tract infection)   Cellulitis of right forearm   CKD (chronic kidney disease) stage 4, GFR 15-29 ml/min (HCC)   Anemia of chronic kidney failure, stage 4 (severe) (HCC)   Hx of CABG   Physical deconditioning   Assessment and Plan: * Acute on chronic diastolic CHF (congestive heart failure) (HCC) Elevated BNP.  Started on Lasix 20 mg IV twice daily Continue home carvedilol.  Hold valsartan due to renal function Daily weights with intake and output monitoring Echocardiogram is pending Cardiology consulted  Physical deconditioning Might benefit from short-term rehab We will consult PT/OT.  Lives alone  Hx of CABG Continue aspirin and statins Troponins negative, EKG nonacute and no complaints of chest pain  Anemia of chronic kidney failure, stage 4 (severe) (HCC) At  baseline  CKD (chronic kidney disease) stage 4, GFR 15-29 ml/min (HCC) Appears to be at baseline  Cellulitis of right forearm Possible thrombophlebitis given location and right antecubital fossa, site of location of recent IV injection IV Rocephin Pain control Negative venous ultrasound of right upper extremity for DVT but showed occlusive cephalic vein thrombosis in the right antecubital region   UTI (urinary tract infection) UA was suspicious for UTI.  IV Rocephin and follow cultures  Cellulitis of left wrist Started on ceftriaxone. Unclear etiology.  Patient has history of psoriasis.  X-ray of the wrist did not show any fracture or dislocation but showed a small radiopaque in the soft tissue.  She has cellulitis changes on the right wrist with severe tenderness and edema. Elevated ESR, CRP.Pending  rheumatoid factor, ANA.  Uric acid level is normal.  Started on prednisone.  Continue pain management, supportive care.  Leukocytosis worsened today, likely secondary to steroids. We also consulted orthopedics today.   Type 2 diabetes mellitus (HCC) Sliding scale insulin coverage.  Monitor blood sugars              DVT prophylaxis:enoxaparin (LOVENOX) injection 40 mg Start: 04/15/21 0800     Code Status: Full Code  Family Communication: None at bedside  Patient status:inpatient  Patient is from :home  Anticipated discharge UL:AGTX  Estimated DC date:2-3 days.  Still has significant left wrist pain, orthopedics evaluation pending   Consultants: cardiology  Procedures:none  Antimicrobials:  Anti-infectives (From admission, onward)    Start     Dose/Rate Route Frequency Ordered Stop   04/15/21 0600  cefTRIAXone (ROCEPHIN) 1 g in  sodium chloride 0.9 % 100 mL IVPB        1 g 200 mL/hr over 30 Minutes Intravenous Every 24 hours 04/14/21 0418     04/14/21 0345  cefTRIAXone (ROCEPHIN) 1 g in sodium chloride 0.9 % 100 mL IVPB        1 g 200 mL/hr over 30 Minutes  Intravenous  Once 04/14/21 9147 04/14/21 0445       Subjective:  Patient seen and examined at the bedside this morning.  Hemodynamically stable.  She feels better today but she still has significant pain on the wrist. pain is better than yesterday though.  Redness, edema on  the left wrist has significantly improved  Objective: Vitals:   04/15/21 0026 04/15/21 0302 04/15/21 0500 04/15/21 0802  BP: (!) 153/45 (!) 165/68  (!) 187/77  Pulse: 85 87  95  Resp: 19 18    Temp: 98.4 F (36.9 C) 98 F (36.7 C)  (!) 97.5 F (36.4 C)  TempSrc:      SpO2: 97% 100%  100%  Weight:   94.7 kg   Height:        Intake/Output Summary (Last 24 hours) at 04/15/2021 1036 Last data filed at 04/15/2021 0856 Gross per 24 hour  Intake --  Output 1450 ml  Net -1450 ml   Filed Weights   04/13/21 2347 04/15/21 0500  Weight: 94.3 kg 94.7 kg    Examination:  General exam: Overall comfortable today, obese HEENT: PERRL Respiratory system:  no wheezes or crackles  Cardiovascular system: S1 & S2 heard, RRR.  Gastrointestinal system: Abdomen is nondistended, soft and nontender. Central nervous system: Alert and oriented Extremities: No lower extremity edema, thrombophlebitis changes on the right cubital fossa,  tenderness/erythematous/edema of the  left wrist Skin: No rashes, no ulcers,no icterus     Data Reviewed: I have personally reviewed following labs and imaging studies  CBC: Recent Labs  Lab 04/09/21 1328 04/10/21 0653 04/11/21 0640 04/12/21 0449 04/13/21 2354 04/15/21 0608  WBC 7.4 7.3 7.4 7.3 11.3* 15.4*  NEUTROABS 6.3  --   --   --   --  14.4*  HGB 10.0* 8.3* 8.2* 8.7* 9.8* 9.8*  HCT 33.1* 25.8* 26.2* 28.1* 30.4* 31.1*  MCV 87.8 86.9 86.2 85.9 84.2 85.4  PLT 199 190 163 172 161 829*   Basic Metabolic Panel: Recent Labs  Lab 04/10/21 0653 04/11/21 0640 04/12/21 0449 04/13/21 2354 04/14/21 0240 04/15/21 0608  NA 140 140 139 133*  --  134*  K 4.9 5.1 4.9 4.6  --  4.8   CL 113* 112* 109 103  --  104  CO2 18* 19* 21* 20*  --  17*  GLUCOSE 216* 124* 118* 217*  --  245*  BUN 47* 41* 40* 36*  --  40*  CREATININE 2.69* 2.43* 2.36* 2.32* 2.29* 2.38*  CALCIUM 7.5* 8.1* 8.4* 8.4*  --  8.2*     Recent Results (from the past 240 hour(s))  Urine Culture     Status: None (Preliminary result)   Collection Time: 04/12/21  7:35 PM   Specimen: Urine, Clean Catch  Result Value Ref Range Status   Specimen Description   Final    URINE, CLEAN CATCH Performed at [facility], [address] [place], [place] [address]    Special Requests   Final    NONE Performed at [facility], [address], [place], [place] [address]    Culture   Final  CULTURE REINCUBATED FOR BETTER GROWTH Performed at [facility], [place] [address], [place], [place] [address]    Report Status PENDING  Incomplete  Resp Panel by RT-PCR (Flu A&B, Covid) Nasopharyngeal Swab     Status: None   Collection Time: 04/14/21  2:40 AM   Specimen: Nasopharyngeal Swab; Nasopharyngeal(NP) swabs in vial transport medium  Result Value Ref Range Status   SARS Coronavirus 2 by RT PCR NEGATIVE NEGATIVE Final    Comment: (NOTE) SARS-CoV-2 target nucleic acids are NOT DETECTED.  The SARS-CoV-2 RNA is generally detectable in upper respiratory specimens during the acute phase of infection. The lowest concentration of SARS-CoV-2 viral copies this assay can detect is 138 copies/mL. A negative result does not preclude SARS-Cov-2 infection and should not be used as the sole basis for treatment or other patient management decisions. A negative result may occur with  improper specimen collection/handling, submission of specimen other than nasopharyngeal swab, presence of viral mutation(s) within the areas targeted by this assay, and inadequate number of viral copies(<138 copies/mL). A negative result must be combined with clinical observations, patient history, and  epidemiological information. The expected result is Negative.  Fact Sheet for Patients:  EntrepreneurPulse.com.au  Fact Sheet for Healthcare Providers:  IncredibleEmployment.be  This test is no t yet approved or cleared by the [place] FDA and  has been authorized for detection and/or diagnosis of SARS-CoV-2 by FDA under an Emergency Use Authorization (EUA). This EUA will remain  in effect (meaning this test can be used) for the duration of the COVID-19 declaration under Section 564(b)(1) of the Act, 21 U.S.C.section 360bbb-3(b)(1), unless the authorization is terminated  or revoked sooner.       Influenza A by PCR NEGATIVE NEGATIVE Final   Influenza B by PCR NEGATIVE NEGATIVE Final    Comment: (NOTE) The Xpert Xpress SARS-CoV-2/FLU/RSV plus assay is intended as an aid in the diagnosis of influenza from Nasopharyngeal swab specimens and should not be used as a sole basis for treatment. Nasal washings and aspirates are unacceptable for Xpert Xpress SARS-CoV-2/FLU/RSV testing.  Fact Sheet for Patients: EntrepreneurPulse.com.au  Fact Sheet for Healthcare Providers: IncredibleEmployment.be  This test is not yet approved or cleared by the [place] FDA and has been authorized for detection and/or diagnosis of SARS-CoV-2 by FDA under an Emergency Use Authorization (EUA). This EUA will remain in effect (meaning this test can be used) for the duration of the COVID-19 declaration under Section 564(b)(1) of the Act, 21 U.S.C. section 360bbb-3(b)(1), unless the authorization is terminated or revoked.  Performed at [facility], [address], [place], [place] [address]      Radiology Studies: DG Chest 2 View  Result Date: 04/14/2021 CLINICAL DATA:  Dizziness and weakness. EXAM: CHEST - 2 VIEW COMPARISON:  April 09, 2021 FINDINGS: Multiple sternal wires are seen. Decreased lung volumes  are noted with mild, diffuse, chronic appearing increased interstitial lung markings. Mild atelectasis is seen along the medial aspect of the left lung base. A very small left pleural effusion is noted. This is mildly decreased in size when compared to the prior exam. No pneumothorax is identified. The cardiac silhouette is mildly enlarged and unchanged in size. Moderate severity calcification of the aortic arch is noted. Degenerative changes are seen throughout the thoracic spine. IMPRESSION: 1. Evidence of prior median sternotomy/CABG. 2. Chronic appearing increased interstitial lung markings with mild left basilar atelectasis. 3. Small left pleural effusion, mildly decreased in size when compared to the prior study.  Electronically Signed   By: Virgina Norfolk M.D.   On: 04/14/2021 00:22   DG Wrist Complete Left  Result Date: 04/14/2021 CLINICAL DATA:  Dizziness, weakness and left arm pain. EXAM: LEFT WRIST - COMPLETE 3+ VIEW COMPARISON:  None. FINDINGS: There is no evidence of fracture or dislocation. Marked severity degenerative changes are seen involving the carpometacarpal articulation of the left thumb. There is mild diffuse soft tissue swelling with a small, thin linear radiopaque soft tissue foreign body seen within the soft tissues adjacent to the lateral aspect of the distal left radius. IMPRESSION: Marked severity degenerative changes, without evidence of an acute osseous abnormality. Electronically Signed   By: Virgina Norfolk M.D.   On: 04/14/2021 00:25   US Venous Img Upper Uni Right(DVT)  Result Date: 04/14/2021 CLINICAL DATA:  Right arm swelling for 1 day EXAM: RIGHT UPPER EXTREMITY VENOUS DOPPLER ULTRASOUND TECHNIQUE: Gray-scale sonography with graded compression, as well as color Doppler and duplex ultrasound were performed to evaluate the upper extremity deep venous system from the level of the subclavian vein and including the jugular, axillary, basilic, radial, ulnar and upper  cephalic vein. Spectral Doppler was utilized to evaluate flow at rest and with distal augmentation maneuvers. COMPARISON:  None. FINDINGS: Contralateral Subclavian Vein: Respiratory phasicity is normal and symmetric with the symptomatic side. No evidence of thrombus. Normal compressibility. Internal Jugular Vein: No evidence of thrombus. Subclavian Vein: No evidence of thrombus. Axillary Vein: No evidence of thrombus. Cephalic Vein: Occlusive thrombus at the level of the antecubital fossa involving a segment at least 3 cm in length. This is in the superficial system. Basilic Vein: No evidence of thrombus. Brachial Veins: No evidence of thrombus. Radial Veins: No evidence of thrombus. Ulnar Veins: No evidence of thrombus. Venous Reflux:  None visualized. IMPRESSION: Occlusive cephalic vein thrombosis in the right antecubital region, reportedly site of prior IV access. Electronically Signed   By: Jorje Guild M.D.   On: 04/14/2021 04:24   CT CHEST ABDOMEN PELVIS WO CONTRAST  Result Date: 04/14/2021 CLINICAL DATA:  Shortness of breath, weakness and nausea. EXAM: CT CHEST, ABDOMEN AND PELVIS WITHOUT CONTRAST TECHNIQUE: Multidetector CT imaging of the chest, abdomen and pelvis was performed following the standard protocol without IV contrast. RADIATION DOSE REDUCTION: This exam was performed according to the departmental dose-optimization program which includes automated exposure control, adjustment of the mA and/or kV according to patient size and/or use of iterative reconstruction technique. COMPARISON:  Chest CT without contrast 04/09/2021, CT abdomen pelvis with IV contrast 02/27/2019 FINDINGS: CT CHEST FINDINGS Cardiovascular: The cardiac size is upper-normal. Pulmonary veins are normal caliber and there is no significant pericardial effusion. There is heavy native three-vessel calcific CAD with sternotomy changes and old CABG. There is aortic atherosclerosis without aneurysm. The pulmonary arteries are  normal caliber. Mediastinum/Nodes: The lower poles of the thyroid gland, trachea, thoracic esophagus are unremarkable. Axillary spaces are clear. No intrathoracic adenopathy is seen. Lungs/Pleura: There is a moderate-sized left and small right layering pleural effusions, both increased. Adjacent consolidation or atelectasis in left lower lobe appears similar to the previous exam as well as posterior atelectasis in the right lower lobe. Scar-like opacities are again seen in the right middle lobe with additional ground-glass pneumonitis or atelectasis posteriorly in the right upper lobe also unchanged in appearance. Left upper lobe is clear except for atelectasis in the lingular base. The central airways are clear. 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 Osteopenia. CT ABDOMEN PELVIS  FINDINGS Hepatobiliary: 19 cm length mildly steatotic liver, otherwise unremarkable without contrast. Gallbladder and bile ducts are unremarkable. Pancreas: Partially atrophic, otherwise unremarkable without contrast. Spleen: Normal in size and noncontrast attenuation. Adrenals/Urinary Tract: There is no adrenal mass. Wedge-shaped scar-like defect noted in the left upper renal cortex, otherwise no focal abnormality in the unenhanced renal cortex is seen. Perinephric stranding is similar to prior study as well as trace perinephric fluid. There is no evidence of urinary stone or obstruction. The bladder wall is thickened but not fully distended and there is air in the bladder. Stomach/Bowel: No dilatation or wall thickening including the appendix. Scattered colonic diverticula without focal inflammation. Vascular/Lymphatic: Aortic atherosclerosis. No enlarged abdominal or pelvic lymph nodes. Reproductive: The uterus is intact. There are BTL changes and a subserosal 3.1 cm fundal fibroid again noted of the right. No adnexal mass is seen. The endometrium is more prominent than usually seen at  this age, measuring 8 mm. A similar finding was seen previously. Nonemergent ultrasound follow-up is recommended. Other: Small umbilical fat hernia. There is trace posterior deep pelvic ascites. There is no free air, hemorrhage or abscess. Musculoskeletal: Advanced facet hypertrophy lower lumbar spine with chronic grade 1 L4-5 degenerative anterolisthesis and acquired spinal stenosis. Similar severe spinal stenosis L3-4. No destructive bone lesion is seen. IMPRESSION: 1. Moderate left and small layering right pleural effusions, both increased from 5 days ago. Adjacent atelectasis or consolidation in the left lower lobe appears similar. 2. Resolution of small ground-glass infiltrates in the posterior left upper lobe previously with similar appearance of ground-glass atelectasis or infiltrates in the posterior right upper lobe. 3. Prior CABG, with upper-normal heart size, aortic and coronary artery atherosclerosis. 4. Cystitis versus bladder nondistention, with air in the bladder which could be due to instrumentation or infectious process. Correlate clinically with urinalysis. 5. Additional chronic findings in the abdomen and pelvis with no other new abnormality. 6. Prominent endometrium for age. A similar finding was noted previously. Nonemergent ultrasound follow-up recommended. 7. Trace ascites. 8. Osteopenia and degenerative change with severe acquired spinal stenosis L3-4 and L4-5. Electronically Signed   By: Telford Nab M.D.   On: 04/14/2021 03:32    Scheduled Meds:  enoxaparin (LOVENOX) injection  40 mg Subcutaneous Q24H   furosemide  20 mg Intravenous BID   insulin aspart  0-20 Units Subcutaneous TID WC   insulin aspart  0-5 Units Subcutaneous QHS   predniSONE  50 mg Oral Q breakfast   Continuous Infusions:  cefTRIAXone (ROCEPHIN)  IV 1 g (04/15/21 0522)     LOS: 1 day   Shelly Coss, MD [facility] P2/23/2023, 10:36 AM

## 2021-04-15 NOTE — Progress Notes (Signed)
Inpatient Diabetes Program Recommendations  AACE/ADA: New Consensus Statement on Inpatient Glycemic Control   Target Ranges:  Prepandial:   less than 140 mg/dL      Peak postprandial:   less than 180 mg/dL (1-2 hours)      Critically ill patients:  140 - 180 mg/dL    Latest Reference Range & Units 04/15/21 08:03 04/15/21 11:08  Glucose-Capillary 70 - 99 mg/dL 203 (H) 287 (H)    Latest Reference Range & Units 04/14/21 07:18 04/14/21 11:40 04/14/21 17:05 04/14/21 22:44  Glucose-Capillary 70 - 99 mg/dL 179 (H) 125 (H) 138 (H) 180 (H)   Review of Glycemic Control  Diabetes history: DM2 Outpatient Diabetes medications: Jardiance 10 mg QAM, Lantus 10 units QHS,  Current orders for Inpatient glycemic control: Novolog 0-20 units TID with meals, Novolog 0-5 units QHS; Prednisone 50 mg daily  Inpatient Diabetes Program Recommendations:    Insulin: If steroids are continued, please consider ordering Semglee 9 units Q24H.   Thanks, Barnie Alderman, RN, MSN, CDE Diabetes Coordinator Inpatient Diabetes Program [phone] (Team Pager from 8am to 5pm)

## 2021-04-15 NOTE — Consult Note (Signed)
ORTHOPAEDIC CONSULTATION  REQUESTING PHYSICIAN: Shelly Coss, MD  Chief Complaint:   Left dorsal wrist pain and swelling.  History of Present Illness: Katrina Ortiz is a 67 y.o. female with multiple medical problems including coronary artery disease, chronic diastolic dysfunction CHF, hypertension, psoriasis, and diabetes.  The patient was admitted to [facility] 6 days ago for hypoxia and hypoglycemia after being found unresponsive in her home.  She was treated for acute kidney injury which responded to IV fluids.  In addition, her hypoglycemia and hypoxia were treated appropriately.  She was able to be discharged home on 2/21, then returned to the hospital later that day complaining of weakness and dizziness.  She also complains of left wrist pain as well as right antecubital fossa pain.  The patient was readmitted to the hospitalist service and started on IV antibiotics for presumed left dorsal wrist cellulitis as well as for a probable thrombophlebitis of the right antecubital fossa where she had an IV at her prior hospitalization.  I have been asked to evaluate the patient for her left dorsal wrist/hand pain and swelling.  The patient notes that the symptoms have been present for 2 days.  She denies any specific injury to the area, and denies any IVs or other penetrating injury to this area.  She denies any numbness or paresthesias to her hand.  She does feel that her symptoms have improved somewhat over the past 24 hours since being on the IV antibiotics.  Past Medical History:  Diagnosis Date   Diabetes mellitus without complication ([place])    Hypertension    Psoriasis    Past Surgical History:  Procedure Laterality Date   CESAREAN SECTION     2 c-sections   CORONARY ARTERY BYPASS GRAFT     CORONARY STENT PLACEMENT     HERNIA REPAIR     TUBAL LIGATION     Social History   Socioeconomic History   Marital status: Married     Spouse name: Not on file   Number of children: Not on file   Years of education: Not on file   Highest education level: Not on file  Occupational History   Not on file  Tobacco Use   Smoking status: Former    Types: Cigarettes    Quit date: 11/22/2002    Years since quitting: 18.4   Smokeless tobacco: Never  Substance and Sexual Activity   Alcohol use: No   Drug use: No   Sexual activity: Not on file  Other Topics Concern   Not on file  Social History Narrative   Not on file   Social Determinants of Health   Financial Resource Strain: Not on file  Food Insecurity: Not on file  Transportation Needs: Not on file  Physical Activity: Not on file  Stress: Not on file  Social Connections: Not on file   History reviewed. No pertinent family history. No Known Allergies Prior to Admission medications   Medication Sig Start Date End Date Taking? Authorizing Provider  amLODipine (NORVASC) 10 MG tablet Take 10 mg by mouth daily. 12/28/20   [provider]  carvedilol (COREG) 25 MG tablet Take 25 mg by mouth 2 (two) times daily. 03/07/19   [provider]  clopidogrel (PLAVIX) 75 MG tablet Take 75 mg by mouth daily. 03/28/19   [provider]  DULoxetine (CYMBALTA) 60 MG capsule Take 60 mg by mouth daily. 03/28/19   [provider]  furosemide (LASIX) 40 MG tablet Take 40 mg by mouth  2 (two) times daily. 12/03/20   [provider]  gabapentin (NEURONTIN) 300 MG capsule Take 300 mg by mouth at bedtime. 12/29/20   [provider]  JARDIANCE 10 MG TABS tablet Take 10 mg by mouth every morning. 02/25/21   [provider]  LANTUS SOLOSTAR 100 UNIT/ML Solostar Pen Inject 10 Units into the skin at bedtime. Hold if sugar < 100 04/11/21   Max Sane, MD  omeprazole (PRILOSEC) 20 MG capsule Take 20 mg by mouth daily at 12 noon.    [provider]  pravastatin (PRAVACHOL) 80 MG tablet Take 80 mg by mouth daily. 03/07/19   [provider]  valsartan (DIOVAN) 40 MG tablet Take 40 mg by mouth daily. 01/28/21   [provider]   DG Chest 2 View  Result Date: 04/14/2021 CLINICAL DATA:  Dizziness and weakness. EXAM: CHEST - 2 VIEW COMPARISON:  April 09, 2021 FINDINGS: Multiple sternal wires are seen. Decreased lung volumes are noted with mild, diffuse, chronic appearing increased interstitial lung markings. Mild atelectasis is seen along the medial aspect of the left lung base. A very small left pleural effusion is noted. This is mildly decreased in size when compared to the prior exam. No pneumothorax is identified. The cardiac silhouette is mildly enlarged and unchanged in size. Moderate severity calcification of the aortic arch is noted. Degenerative changes are seen throughout the thoracic spine. IMPRESSION: 1. Evidence of prior median sternotomy/CABG. 2. Chronic appearing increased interstitial lung markings with mild left basilar atelectasis. 3. Small left pleural effusion, mildly decreased in size when compared to the prior study. Electronically Signed   By: Virgina Norfolk M.D.   On: 04/14/2021 00:22   DG Wrist Complete Left  Result Date: 04/14/2021 CLINICAL DATA:  Dizziness, weakness and left arm pain. EXAM: LEFT WRIST - COMPLETE 3+ VIEW COMPARISON:  None. FINDINGS: There is no evidence of fracture or dislocation. Marked severity degenerative changes are seen involving the carpometacarpal articulation of the left thumb. There is mild diffuse soft tissue swelling with a small, thin linear radiopaque soft tissue foreign body seen within the soft tissues adjacent to the lateral aspect of the distal left radius. IMPRESSION: Marked severity degenerative changes, without evidence of an acute osseous abnormality. Electronically Signed   By: Virgina Norfolk M.D.   On: 04/14/2021 00:25   US Venous Img Upper Uni Right(DVT)  Result Date: 04/14/2021 CLINICAL DATA:  Right arm swelling for 1 day EXAM: RIGHT UPPER  EXTREMITY VENOUS DOPPLER ULTRASOUND TECHNIQUE: Gray-scale sonography with graded compression, as well as color Doppler and duplex ultrasound were performed to evaluate the upper extremity deep venous system from the level of the subclavian vein and including the jugular, axillary, basilic, radial, ulnar and upper cephalic vein. Spectral Doppler was utilized to evaluate flow at rest and with distal augmentation maneuvers. COMPARISON:  None. FINDINGS: Contralateral Subclavian Vein: Respiratory phasicity is normal and symmetric with the symptomatic side. No evidence of thrombus. Normal compressibility. Internal Jugular Vein: No evidence of thrombus. Subclavian Vein: No evidence of thrombus. Axillary Vein: No evidence of thrombus. Cephalic Vein: Occlusive thrombus at the level of the antecubital fossa involving a segment at least 3 cm in length. This is in the superficial system. Basilic Vein: No evidence of thrombus. Brachial Veins: No evidence of thrombus. Radial Veins: No evidence of thrombus. Ulnar Veins: No evidence of thrombus. Venous Reflux:  None visualized. IMPRESSION: Occlusive cephalic vein thrombosis in the right antecubital region, reportedly site of prior IV access. Electronically  Signed   By: Jorje Guild M.D.   On: 04/14/2021 04:24   ECHOCARDIOGRAM COMPLETE  Result Date: 04/15/2021    ECHOCARDIOGRAM REPORT   Patient Name:   Katrina Ortiz Date of Exam: 04/15/2021 Medical Rec #:  [id number]      Height:       63.0 in Accession #:    [id number]     Weight:       208.8 lb Date of Birth:  10/17/1955     BSA:          1.969 m Patient Age:    75 years       BP:           165/68 mmHg Patient Gender: F              HR:           95 bpm. Exam Location:  [facility] Procedure: 2D Echo, Cardiac Doppler, Color Doppler and Intracardiac            Opacification Agent Indications:     M19.62 Acute Diastolic Heart Failure  History:         Patient has prior history of Echocardiogram examinations, most                   recent 04/11/2019. Risk Factors:Hypertension and Diabetes.  Sonographer:     Cresenciano Lick RDCS Referring Phys:  [id number] Kate Sable Diagnosing Phys: Ida Rogue MD IMPRESSIONS  1. Left ventricular ejection fraction, by estimation, is 60 to 65%. The left ventricle has normal function. The left ventricle has no regional wall motion abnormalities. Left ventricular diastolic parameters are consistent with Grade I diastolic dysfunction (impaired relaxation).  2. Right ventricular systolic function is normal. The right ventricular size is normal. Tricuspid regurgitation signal is inadequate for assessing PA pressure.  3. The mitral valve is normal in structure. Mild mitral valve regurgitation. No evidence of mitral stenosis.  4. The aortic valve is normal in structure. Aortic valve regurgitation is not visualized. No aortic stenosis is present.  5. The inferior vena cava is normal in size with greater than 50% respiratory variability, suggesting right atrial pressure of 3 mmHg. FINDINGS  Left Ventricle: Left ventricular ejection fraction, by estimation, is 60 to 65%. The left ventricle has normal function. The left ventricle has no regional wall motion abnormalities. Definity contrast agent was given IV to delineate the left ventricular  endocardial borders. The left ventricular internal cavity size was normal in size. There is no left ventricular hypertrophy. Left ventricular diastolic parameters are consistent with Grade I diastolic dysfunction (impaired relaxation). Right Ventricle: The right ventricular size is normal. No increase in right ventricular wall thickness. Right ventricular systolic function is normal. Tricuspid regurgitation signal is inadequate for assessing PA pressure. Left Atrium: Left atrial size was normal in size. Right Atrium: Right atrial size was normal in size. Pericardium: There is no evidence of pericardial effusion. Mitral Valve: The mitral valve is normal in structure. Mild  mitral valve regurgitation. No evidence of mitral valve stenosis. Tricuspid Valve: The tricuspid valve is normal in structure. Tricuspid valve regurgitation is not demonstrated. No evidence of tricuspid stenosis. Aortic Valve: The aortic valve is normal in structure. Aortic valve regurgitation is not visualized. No aortic stenosis is present. Pulmonic Valve: The pulmonic valve was normal in structure. Pulmonic valve regurgitation is not visualized. No evidence of pulmonic stenosis. Aorta: The aortic root is normal in size and structure. Venous: The inferior vena cava is normal  in size with greater than 50% respiratory variability, suggesting right atrial pressure of 3 mmHg. IAS/Shunts: No atrial level shunt detected by color flow Doppler.  LEFT VENTRICLE PLAX 2D LVIDd:         4.40 cm   Diastology LVIDs:         3.00 cm   LV e' medial:    5.72 cm/s LV PW:         1.10 cm   LV E/e' medial:  12.4 LV IVS:        1.00 cm   LV e' lateral:   8.45 cm/s LVOT diam:     1.90 cm   LV E/e' lateral: 8.4 LV SV:         67 LV SV Index:   34 LVOT Area:     2.84 cm  RIGHT VENTRICLE            IVC RV Basal diam:  3.90 cm    IVC diam: 1.90 cm RV S prime:     9.98 cm/s TAPSE (M-mode): 1.7 cm LEFT ATRIUM             Index        RIGHT ATRIUM           Index LA diam:        4.40 cm 2.23 cm/m   RA Area:     15.30 cm LA Vol (A2C):   62.3 ml 31.64 ml/m  RA Volume:   41.80 ml  21.23 ml/m LA Vol (A4C):   42.1 ml 21.38 ml/m LA Biplane Vol: 51.6 ml 26.20 ml/m  AORTIC VALVE LVOT Vmax:   105.00 cm/s LVOT Vmean:  79.000 cm/s LVOT VTI:    0.238 m  AORTA Ao Root diam: 3.00 cm MITRAL VALVE MV Area (PHT): 4.39 cm     SHUNTS MV Decel Time: 173 msec     Systemic VTI:  0.24 m MV E velocity: 70.70 cm/s   Systemic Diam: 1.90 cm MV A velocity: 110.00 cm/s MV E/A ratio:  0.64 Ida Rogue MD Electronically signed by Ida Rogue MD Signature Date/Time: 04/15/2021/1:11:36 PM    Final    CT CHEST ABDOMEN PELVIS WO CONTRAST  Result Date:  04/14/2021 CLINICAL DATA:  Shortness of breath, weakness and nausea. EXAM: CT CHEST, ABDOMEN AND PELVIS WITHOUT CONTRAST TECHNIQUE: Multidetector CT imaging of the chest, abdomen and pelvis was performed following the standard protocol without IV contrast. RADIATION DOSE REDUCTION: This exam was performed according to the departmental dose-optimization program which includes automated exposure control, adjustment of the mA and/or kV according to patient size and/or use of iterative reconstruction technique. COMPARISON:  Chest CT without contrast 04/09/2021, CT abdomen pelvis with IV contrast 02/27/2019 FINDINGS: CT CHEST FINDINGS Cardiovascular: The cardiac size is upper-normal. Pulmonary veins are normal caliber and there is no significant pericardial effusion. There is heavy native three-vessel calcific CAD with sternotomy changes and old CABG. There is aortic atherosclerosis without aneurysm. The pulmonary arteries are normal caliber. Mediastinum/Nodes: The lower poles of the thyroid gland, trachea, thoracic esophagus are unremarkable. Axillary spaces are clear. No intrathoracic adenopathy is seen. Lungs/Pleura: There is a moderate-sized left and small right layering pleural effusions, both increased. Adjacent consolidation or atelectasis in left lower lobe appears similar to the previous exam as well as posterior atelectasis in the right lower lobe. Scar-like opacities are again seen in the right middle lobe with additional ground-glass pneumonitis or atelectasis posteriorly in the right upper lobe also unchanged in appearance.  Left upper lobe is clear except for atelectasis in the lingular base. The central airways are clear. 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 Osteopenia. CT ABDOMEN PELVIS FINDINGS Hepatobiliary: 19 cm length mildly steatotic liver, otherwise unremarkable without contrast. Gallbladder and bile ducts are unremarkable. Pancreas: Partially  atrophic, otherwise unremarkable without contrast. Spleen: Normal in size and noncontrast attenuation. Adrenals/Urinary Tract: There is no adrenal mass. Wedge-shaped scar-like defect noted in the left upper renal cortex, otherwise no focal abnormality in the unenhanced renal cortex is seen. Perinephric stranding is similar to prior study as well as trace perinephric fluid. There is no evidence of urinary stone or obstruction. The bladder wall is thickened but not fully distended and there is air in the bladder. Stomach/Bowel: No dilatation or wall thickening including the appendix. Scattered colonic diverticula without focal inflammation. Vascular/Lymphatic: Aortic atherosclerosis. No enlarged abdominal or pelvic lymph nodes. Reproductive: The uterus is intact. There are BTL changes and a subserosal 3.1 cm fundal fibroid again noted of the right. No adnexal mass is seen. The endometrium is more prominent than usually seen at this age, measuring 8 mm. A similar finding was seen previously. Nonemergent ultrasound follow-up is recommended. Other: Small umbilical fat hernia. There is trace posterior deep pelvic ascites. There is no free air, hemorrhage or abscess. Musculoskeletal: Advanced facet hypertrophy lower lumbar spine with chronic grade 1 L4-5 degenerative anterolisthesis and acquired spinal stenosis. Similar severe spinal stenosis L3-4. No destructive bone lesion is seen. IMPRESSION: 1. Moderate left and small layering right pleural effusions, both increased from 5 days ago. Adjacent atelectasis or consolidation in the left lower lobe appears similar. 2. Resolution of small ground-glass infiltrates in the posterior left upper lobe previously with similar appearance of ground-glass atelectasis or infiltrates in the posterior right upper lobe. 3. Prior CABG, with upper-normal heart size, aortic and coronary artery atherosclerosis. 4. Cystitis versus bladder nondistention, with air in the bladder which could be  due to instrumentation or infectious process. Correlate clinically with urinalysis. 5. Additional chronic findings in the abdomen and pelvis with no other new abnormality. 6. Prominent endometrium for age. A similar finding was noted previously. Nonemergent ultrasound follow-up recommended. 7. Trace ascites. 8. Osteopenia and degenerative change with severe acquired spinal stenosis L3-4 and L4-5. Electronically Signed   By: Telford Nab M.D.   On: 04/14/2021 03:32    Positive ROS: All other systems have been reviewed and were otherwise negative with the exception of those mentioned in the HPI and as above.  Physical Exam: General:  Alert, no acute distress Psychiatric:  Patient is competent for consent with normal mood and affect   Cardiovascular:  No pedal edema Respiratory:  No wheezing, non-labored breathing GI:  Abdomen is soft and non-tender Skin:  No lesions in the area of chief complaint Neurologic:  Sensation intact distally Lymphatic:  No axillary or cervical lymphadenopathy  Orthopedic Exam:  Orthopedic examination is limited to the left wrist and hand.  Skin inspection is notable for moderate swelling and erythema over the dorsal aspect of the wrist extending to the mid metacarpal region of the dorsal aspect of the hand as well as to both the radial and ulnar margins of the wrist.  In addition, there is an old well-healed longitudinal incision over the volar aspect of the forearm resulting from a prior vein harvest.  No areas of skin breakdown, ecchymosis, or other skin abnormalities are identified.  There is moderate tenderness to palpation over the dorsal and  dorsal radial aspects of the wrist.  However, no obvious effusion is noted.  She is able to active flex and extend all digits without any pain or triggering.  She is able to actively flex and extend the wrist through a range of approximately 40 degrees without pain.  She is neurovascularly intact to all digits.  X-rays:  Recent  AP, lateral, and oblique views of the left wrist are available for review and have been reviewed by myself.  These films demonstrate moderate to severe degenerative changes involving the thumb CMC joint, but otherwise unremarkable.  No fractures, lytic lesions, or other areas of significant degenerative changes are identified.  There is a small metallic fragment in the volar radial soft tissues.  Assessment: Dorsal left hand cellulitis.  Plan: The treatment options have been discussed with the patient.  Given that the patient's symptoms appear to be improving and that she is able to actively flex and extend her wrist without significant pain argues against a septic wrist.  Therefore, I do not think there is any indication for surgical intervention at this time.  I recommend that she continue to be managed with IV antibiotics and I will continue to observe her over the next few days to be sure that she continues to improve.  She may gradually progress in her activities as symptoms permit, and receive pain medication as deemed appropriate medically.  Thank you for asking me to participate in the care of this most pleasant yet unfortunate woman.  I will be happy to follow her with you.   Pascal Lux, MD  Beeper #:  [phone]  04/15/2021 2:40 PM

## 2021-04-15 NOTE — TOC CM/SW Note (Signed)
CSW acknowledges heart failure consult. Heart failure nurse navigator is aware.   Dayton Scrape, [place]

## 2021-04-15 NOTE — Care Management Important Message (Signed)
Important Message  Patient Details  Name: Katrina Ortiz MRN: [id number] Date of Birth: 11-12-1955   Medicare Important Message Given:  N/A - LOS <3 / Initial given by admissions     Dannette Barbara 04/15/2021, 5:05 PM

## 2021-04-15 NOTE — Plan of Care (Signed)
  Problem: Education: Goal: Knowledge of General Education information will improve Description Including pain rating scale, medication(s)/side effects and non-pharmacologic comfort measures Outcome: Progressing   Problem: Health Behavior/Discharge Planning: Goal: Ability to manage health-related needs will improve Outcome: Progressing   

## 2021-04-15 NOTE — Evaluation (Signed)
Occupational Therapy Evaluation Patient Details Name: Katrina Ortiz MRN: [id number] DOB: 05-22-1955 Today's Date: 04/15/2021   History of Present Illness Katrina Ortiz is a 66 y.o. female with medical history significant of Diastolic CHF, CAD with history of CABG, HTN, DM hospitalized from 2/17 to 2/19 with unresponsiveness related to hypoglycemia, AKI, and hypoxia. Pt presents to the ED with nausea, shortness of breath, generalized weakness and also complains of pain and redness of the left wrist and the right antecubital area. Negative venous ultrasound of right upper extremity for DVT but showed occlusive cephalic vein thrombosis in the right antecubital region   Clinical Impression   Katrina Ortiz was seen for OT evaluation this date. Prior to hospital admission, pt was MOD I for mobility and ADLs using RW. Pt lives alone in home c ramped entrance. Pt presents to acute OT demonstrating impaired ADL performance and functional mobility 2/2 decreased activity tolerance and functional strength/ROM/balance deficits. Pt currently requires SETUP + SUPERVISION don/doff B socks sitting EOB. MIN A x2 + HHA for sit<>stand and in room mobility ~20 ft, +2 for safety and equipment mgmt. Pt instructed on LUE HEP within pain free ROM and positioning for edema control. Pt would benefit from skilled OT to address noted impairments and functional limitations (see below for any additional details). Upon hospital discharge, recommend STR to maximize pt safety and return to PLOF.      Recommendations for follow up therapy are one component of a multi-disciplinary discharge planning process, led by the attending physician.  Recommendations may be updated based on patient status, additional functional criteria and insurance authorization.   Follow Up Recommendations  Skilled nursing-short term rehab (<3 hours/day)    Assistance Recommended at Discharge Intermittent Supervision/Assistance  Patient can return home with  the following A little help with walking and/or transfers;A little help with bathing/dressing/bathroom;Assistance with cooking/housework;Help with stairs or ramp for entrance    Functional Status Assessment  Patient has had a recent decline in their functional status and demonstrates the ability to make significant improvements in function in a reasonable and predictable amount of time.  Equipment Recommendations  BSC/3in1    Recommendations for Other Services       Precautions / Restrictions Precautions Precautions: Fall Restrictions Weight Bearing Restrictions: No Other Position/Activity Restrictions: L wrist treated NWBing for comfort      Mobility Bed Mobility Overal bed mobility: Needs Assistance Bed Mobility: Supine to Sit, Sit to Supine     Supine to sit: Min assist Sit to supine: Supervision        Transfers Overall transfer level: Needs assistance Equipment used: 2 person hand held assist Transfers: Sit to/from Stand Sit to Stand: Min assist, +2 safety/equipment                  Balance Overall balance assessment: Needs assistance Sitting-balance support: No upper extremity supported, Feet supported Sitting balance-Leahy Scale: Good     Standing balance support: Single extremity supported Standing balance-Leahy Scale: Fair                             ADL either performed or assessed with clinical judgement   ADL Overall ADL's : Needs assistance/impaired                                       General ADL Comments: SETUP + SUPERVISION don/doff  B socks sitting EOB. MIN A x2 + HHA for ADL t/f      Pertinent Vitals/Pain Pain Assessment Pain Assessment: 0-10 Pain Score: 6  Pain Location: L wrist Pain Descriptors / Indicators: Aching, Discomfort, Dull, Grimacing Pain Intervention(s): Limited activity within patient's tolerance, Repositioned     Hand Dominance Right   Extremity/Trunk Assessment Upper Extremity  Assessment Upper Extremity Assessment: LUE deficits/detail LUE Deficits / Details: elbow/shoulder WFL. wrist flexion/extension, composite digit flexion, and supination limited by pain LUE: Unable to fully assess due to pain   Lower Extremity Assessment Lower Extremity Assessment: Generalized weakness       Communication Communication Communication: No difficulties   Cognition Arousal/Alertness: Awake/alert Behavior During Therapy: WFL for tasks assessed/performed Overall Cognitive Status: Within Functional Limits for tasks assessed                                       General Comments  SpO2 90s t/o session on 2L [place]     Home Living Family/patient expects to be discharged to:: Private residence Living Arrangements: Alone Available Help at Discharge: Friend(s);Available PRN/intermittently Type of Home: House Home Access: Ramped entrance     Home Layout: One level               Home Equipment: Conservation officer, nature (2 wheels)          Prior Functioning/Environment Prior Level of Function : Independent/Modified Independent;Driving             Mobility Comments: RW household mobility, uses grocery store motorozied carts for shopping          OT Problem List: Decreased strength;Decreased range of motion;Impaired balance (sitting and/or standing);Decreased activity tolerance;Decreased safety awareness;Impaired UE functional use      OT Treatment/Interventions: Self-care/ADL training;Therapeutic exercise;Energy conservation;DME and/or AE instruction;Therapeutic activities;Balance training;Patient/family education    OT Goals(Current goals can be found in the care plan section) Acute Rehab OT Goals Patient Stated Goal: to improve pain OT Goal Formulation: With patient Time For Goal Achievement: 04/29/21 Potential to Achieve Goals: Good ADL Goals Pt Will Perform Grooming: with modified independence;standing Pt Will Perform Lower Body Dressing: with  modified independence;sit to/from stand Pt Will Transfer to Toilet: with modified independence;ambulating;regular height toilet  OT Frequency: Min 2X/week    Co-evaluation PT/OT/SLP Co-Evaluation/Treatment: Yes Reason for Co-Treatment: For patient/therapist safety;To address functional/ADL transfers PT goals addressed during session: Mobility/safety with mobility;Balance OT goals addressed during session: ADL's and self-care      AM-PAC OT "6 Clicks" Daily Activity     Outcome Measure Help from another person eating meals?: A Little Help from another person taking care of personal grooming?: A Little Help from another person toileting, which includes using toliet, bedpan, or urinal?: A Little Help from another person bathing (including washing, rinsing, drying)?: A Lot Help from another person to put on and taking off regular upper body clothing?: A Little Help from another person to put on and taking off regular lower body clothing?: A Little 6 Click Score: 17   End of Session Equipment Utilized During Treatment: Oxygen Nurse Communication: Mobility status  Activity Tolerance: Patient tolerated treatment well Patient left: in bed;with call bell/phone within reach;with bed alarm set  OT Visit Diagnosis: Other abnormalities of gait and mobility (R26.89);Muscle weakness (generalized) (M62.81)                Time: 0300-9233 OT Time Calculation (min): 21  min Charges:  OT General Charges $OT Visit: 1 Visit OT Evaluation $OT Eval Low Complexity: 1 Low OT Treatments $Self Care/Home Management : 8-22 mins  Dessie Coma, M.S. OTR/L  04/15/21, 2:49 PM  ascom [phone]

## 2021-04-16 DIAGNOSIS — I5033 Acute on chronic diastolic (congestive) heart failure: Secondary | ICD-10-CM | POA: Diagnosis not present

## 2021-04-16 DIAGNOSIS — I10 Essential (primary) hypertension: Secondary | ICD-10-CM

## 2021-04-16 LAB — CBC WITH DIFFERENTIAL/PLATELET
Abs Immature Granulocytes: 0.13 10*3/uL — ABNORMAL HIGH (ref 0.00–0.07)
Basophils Absolute: 0 10*3/uL (ref 0.0–0.1)
Basophils Relative: 0 %
Eosinophils Absolute: 0 10*3/uL (ref 0.0–0.5)
Eosinophils Relative: 0 %
HCT: 25.5 % — ABNORMAL LOW (ref 36.0–46.0)
Hemoglobin: 8.3 g/dL — ABNORMAL LOW (ref 12.0–15.0)
Immature Granulocytes: 1 %
Lymphocytes Relative: 4 %
Lymphs Abs: 0.6 10*3/uL — ABNORMAL LOW (ref 0.7–4.0)
MCH: 27.5 pg (ref 26.0–34.0)
MCHC: 32.5 g/dL (ref 30.0–36.0)
MCV: 84.4 fL (ref 80.0–100.0)
Monocytes Absolute: 1.2 10*3/uL — ABNORMAL HIGH (ref 0.1–1.0)
Monocytes Relative: 8 %
Neutro Abs: 13.3 10*3/uL — ABNORMAL HIGH (ref 1.7–7.7)
Neutrophils Relative %: 87 %
Platelets: 170 10*3/uL (ref 150–400)
RBC: 3.02 MIL/uL — ABNORMAL LOW (ref 3.87–5.11)
RDW: 15.2 % (ref 11.5–15.5)
WBC: 15.2 10*3/uL — ABNORMAL HIGH (ref 4.0–10.5)
nRBC: 0 % (ref 0.0–0.2)

## 2021-04-16 LAB — GLUCOSE, CAPILLARY
Glucose-Capillary: 313 mg/dL — ABNORMAL HIGH (ref 70–99)
Glucose-Capillary: 342 mg/dL — ABNORMAL HIGH (ref 70–99)
Glucose-Capillary: 323 mg/dL — ABNORMAL HIGH (ref 70–99)
Glucose-Capillary: 203 mg/dL — ABNORMAL HIGH (ref 70–99)

## 2021-04-16 LAB — BASIC METABOLIC PANEL
Anion gap: 10 (ref 5–15)
BUN: 59 mg/dL — ABNORMAL HIGH (ref 8–23)
CO2: 18 mmol/L — ABNORMAL LOW (ref 22–32)
Calcium: 7.6 mg/dL — ABNORMAL LOW (ref 8.9–10.3)
Chloride: 102 mmol/L (ref 98–111)
Creatinine, Ser: 2.62 mg/dL — ABNORMAL HIGH (ref 0.44–1.00)
GFR, Estimated: 20 mL/min — ABNORMAL LOW (ref >60)
Glucose, Bld: 315 mg/dL — ABNORMAL HIGH (ref 70–99)
Potassium: 4.6 mmol/L (ref 3.5–5.1)
Sodium: 130 mmol/L — ABNORMAL LOW (ref 135–145)

## 2021-04-16 LAB — URINE CULTURE: Culture: >=100000 — AB

## 2021-04-16 LAB — ANA W/REFLEX: Anti Nuclear Antibody (ANA): NEGATIVE

## 2021-04-16 LAB — HEMOGLOBIN A1C
Hgb A1c MFr Bld: 7.5 % — ABNORMAL HIGH (ref 4.8–5.6)
Mean Plasma Glucose: 169 mg/dL

## 2021-04-16 LAB — RHEUMATOID FACTOR: Rheumatoid fact SerPl-aCnc: 16.2 IU/mL — ABNORMAL HIGH (ref <14.0)

## 2021-04-16 MED ORDER — PREDNISONE 20 MG PO TABS
20.0000 mg | ORAL_TABLET | Freq: Every day | ORAL | Status: AC
  Administered 2021-04-17 – 2021-04-19 (×3): 20 mg via ORAL
  Filled 2021-04-16 (×3): qty 1

## 2021-04-16 MED ORDER — INSULIN GLARGINE-YFGN 100 UNIT/ML ~~LOC~~ SOLN
10.0000 [IU] | Freq: Every day | SUBCUTANEOUS | Status: DC
Start: 2021-04-16 — End: 2021-04-19
  Administered 2021-04-16 – 2021-04-19 (×4): 10 [IU] via SUBCUTANEOUS
  Filled 2021-04-16 (×5): qty 0.1

## 2021-04-16 MED ORDER — INSULIN ASPART 100 UNIT/ML IJ SOLN
4.0000 [IU] | Freq: Three times a day (TID) | INTRAMUSCULAR | Status: DC
  Administered 2021-04-16 – 2021-04-19 (×8): 4 [IU] via SUBCUTANEOUS
  Filled 2021-04-16 (×8): qty 1

## 2021-04-16 MED ORDER — PREDNISONE 10 MG PO TABS
10.0000 mg | ORAL_TABLET | Freq: Every day | ORAL | Status: DC
  Administered 2021-04-20: 10 mg via ORAL
  Filled 2021-04-16: qty 1

## 2021-04-16 MED ORDER — ENOXAPARIN SODIUM 30 MG/0.3ML IJ SOSY
30.0000 mg | PREFILLED_SYRINGE | INTRAMUSCULAR | Status: DC
  Administered 2021-04-17 – 2021-04-20 (×4): 30 mg via SUBCUTANEOUS
  Filled 2021-04-16 (×4): qty 0.3

## 2021-04-16 NOTE — Progress Notes (Signed)
Inpatient Diabetes Program Recommendations  AACE/ADA: New Consensus Statement on Inpatient Glycemic Control (2015)  Target Ranges:  Prepandial:   less than 140 mg/dL      Peak postprandial:   less than 180 mg/dL (1-2 hours)      Critically ill patients:  140 - 180 mg/dL    Latest Reference Range & Units 04/16/21 08:18 04/16/21 12:20  Glucose-Capillary 70 - 99 mg/dL 313 (H) 342 (H)  (H): Data is abnormally high    Home DM Meds: Jardiance 10 mg QAM      Lantus 10 units QHS  Current Orders: Semglee 10 units Daily      Novolog Resistant Correction Scale/ SSI (0-20 units) TID AC + HS    MD- Note Prednisone reduced for tomorrow--Semglee started this AM.  Pt having significant afternoon glucose elevations.  Please consider starting Novolog Meal Coverage: Novolog 4 units TID with meals HOLD if pt eats <50% meals    --Will follow patient during hospitalization--  Wyn Quaker RN, MSN, CDE Diabetes Coordinator Inpatient Glycemic Control Team Team Pager: [phone] (8a-5p)

## 2021-04-16 NOTE — Progress Notes (Signed)
Patient ID: Katrina Ortiz, female   DOB: 09-09-1955, 66 y.o.   MRN: [id number]  Subjective: The patient notes further improvement in her left wrist and hand symptoms, although she still has pain.  She remains able to flex and extend her fingers and continues to try to move her fingers frequently.  She has no new complaints.   Objective: Vital signs in last 24 hours: Temp:  [97.8 F (36.6 C)-98.9 F (37.2 C)] 98.9 F (37.2 C) (02/24 0416) Pulse Rate:  [78-96] 78 (02/24 0416) Resp:  [14-18] 14 (02/24 0416) BP: (117-169)/(35-67) 134/61 (02/24 0416) SpO2:  [95 %-99 %] 98 % (02/24 0416) Weight:  [97.4 kg] 97.4 kg (02/24 0416)  Intake/Output from previous day: 02/23 0701 - 02/24 0700 In: 580 [P.O.:480; IV Piggyback:100] Out: 951 [Urine:951] Intake/Output this shift: No intake/output data recorded.  Recent Labs    04/13/21 2354 04/15/21 0608 04/16/21 0632  HGB 9.8* 9.8* 8.3*   Recent Labs    04/15/21 0608 04/16/21 0632  WBC 15.4* 15.2*  RBC 3.64* 3.02*  HCT 31.1* 25.5*  PLT 144* 170   Recent Labs    04/15/21 0608 04/16/21 0632  NA 134* 130*  K 4.8 4.6  CL 104 102  CO2 17* 18*  BUN 40* 59*  CREATININE 2.38* 2.62*  GLUCOSE 245* 315*  CALCIUM 8.2* 7.6*   No results for input(s): LABPT, INR in the last 72 hours.  Physical Exam: Orthopedic examination again is limited to the left wrist and hand.  Her swelling and erythema appear to be improved today as compared to yesterday as skin wrinkles can now be observed on the dorsal aspect of her wrist.  She still has mild/moderate tenderness to palpation over the dorsal aspect of the wrist, but even feels that this is improved as compared to yesterday.  She again is able to actively dorsiflex and plantarflex her wrist through a range of 30 to 40 degrees with only minimal discomfort.  She is able to actively flex and extend all digits without any pain or triggering, although she still is not yet able to make a tight fist due to  dorsal hand and wrist "tightness".  She remains neurovascularly intact to the left hand.  Assessment: Left dorsal wrist cellulitis, improving symptomatically.  Plan: Please continue her IV antibiotics as the dorsal left wrist cellulitis appears to be responding to this treatment.  She is to keep the hand elevated, but may be mobilized with physical therapy as symptoms permit, avoiding weightbearing through the left wrist and hand.  Marshall Cork Kassadi Presswood 04/16/2021, 8:17 AM

## 2021-04-16 NOTE — NC FL2 (Signed)
[place] LEVEL OF CARE SCREENING TOOL     IDENTIFICATION  Patient Name: Katrina Ortiz Birthdate: May 29, 1955 Sex: female Admission Date (Current Location): 04/14/2021  [place] and [place] Number:  Engineering geologist and Address:  [facility], [address] [place], [place] [address]      Provider Number: [id number]  Attending Physician Name and Address:  Shelly Coss, MD  Relative Name and Phone Number:       Current Level of Care: Hospital Recommended Level of Care: [place] Prior Approval Number:    Date Approved/Denied:   PASRR Number: [id number]  Discharge Plan: SNF    Current Diagnoses: Patient Active Problem List   Diagnosis Date Noted   Essential hypertension 04/16/2021   CHF exacerbation ([place]) 04/14/2021   Cellulitis of left wrist    UTI (urinary tract infection)    Cellulitis of right forearm    CKD (chronic kidney disease) stage 4, GFR 15-29 ml/min (HCC)    Anemia of chronic kidney failure, stage 4 (severe) (HCC)    Hx of CABG    Physical deconditioning    Nausea and vomiting 04/11/2021   SOB (shortness of breath)    AKI (acute kidney injury) ([place]) 04/09/2021   Acute on chronic diastolic CHF (congestive heart failure) ([place]) 04/09/2021   Hypothermia 04/09/2021   Type 2 diabetes mellitus ([place]) 25/06/3974   Acute diastolic CHF (congestive heart failure) ([place]) 04/14/2019   Hypoglycemia 04/14/2019   Acute respiratory failure ([place]) 04/10/2019   Coronary artery disease 05/23/2010    Orientation RESPIRATION BLADDER Height & Weight     Self, Time, Situation, Place  O2 (Nasal Cannula 2 L) Continent, External catheter Weight: 214 lb 11.7 oz (97.4 kg) Height:  5\' 3"  (160 cm)  BEHAVIORAL SYMPTOMS/MOOD NEUROLOGICAL BOWEL NUTRITION STATUS   (None)  (None) Continent Diet (Heart healthy/carb modified.)  AMBULATORY STATUS COMMUNICATION OF NEEDS Skin   Limited Assist Verbally Skin abrasions, Bruising,  Other (Comment) (Scratch marks.)                       Personal Care Assistance Level of Assistance  Bathing, Feeding, Dressing Bathing Assistance: Limited assistance Feeding assistance: Limited assistance Dressing Assistance: Limited assistance     Functional Limitations Info  Sight, Hearing, Speech Sight Info: Adequate Hearing Info: Adequate Speech Info: Adequate    SPECIAL CARE FACTORS FREQUENCY  PT (By licensed PT), OT (By licensed OT)     PT Frequency: 5 x week OT Frequency: 5 x week            Contractures Contractures Info: Not present    Additional Factors Info  Code Status, Allergies Code Status Info: 5 x week Allergies Info: 5 x week           Current Medications (04/16/2021):  This is the current hospital active medication list Current Facility-Administered Medications  Medication Dose Route Frequency Provider Last Rate Last Admin   acetaminophen (TYLENOL) tablet 650 mg  650 mg Oral Q6H PRN Athena Masse, MD       Or   acetaminophen (TYLENOL) suppository 650 mg  650 mg Rectal Q6H PRN Athena Masse, MD       amLODipine (NORVASC) tablet 10 mg  10 mg Oral Daily Shelly Coss, MD   10 mg at 04/16/21 0916   carvedilol (COREG) tablet 25 mg  25 mg Oral BID Shelly Coss, MD   25 mg at 04/16/21 0917   cefTRIAXone (ROCEPHIN)  1 g in sodium chloride 0.9 % 100 mL IVPB  1 g Intravenous Q24H Judd Gaudier V, MD 200 mL/hr at 04/16/21 0515 1 g at 04/16/21 0515   clopidogrel (PLAVIX) tablet 75 mg  75 mg Oral Daily Theora Gianotti, NP   75 mg at 04/16/21 0916   DULoxetine (CYMBALTA) DR capsule 60 mg  60 mg Oral Daily Shelly Coss, MD   60 mg at 04/16/21 0916   enoxaparin (LOVENOX) injection 40 mg  40 mg Subcutaneous Q24H Lorna Dibble, RPH   40 mg at 04/16/21 7619   furosemide (LASIX) tablet 40 mg  40 mg Oral BID Theora Gianotti, NP   40 mg at 04/16/21 5093   gabapentin (NEURONTIN) capsule 300 mg  300 mg Oral QHS Shelly Coss, MD    300 mg at 04/15/21 2154   hydrALAZINE (APRESOLINE) injection 10 mg  10 mg Intravenous Q6H PRN Shelly Coss, MD   10 mg at 04/15/21 0848   hydrALAZINE (APRESOLINE) tablet 25 mg  25 mg Oral Q8H Theora Gianotti, NP   25 mg at 04/16/21 1255   HYDROmorphone (DILAUDID) injection 0.5 mg  0.5 mg Intravenous Q4H PRN Shelly Coss, MD       insulin aspart (novoLOG) injection 0-20 Units  0-20 Units Subcutaneous TID WC Athena Masse, MD   15 Units at 04/16/21 1255   insulin aspart (novoLOG) injection 0-5 Units  0-5 Units Subcutaneous QHS Athena Masse, MD   3 Units at 04/15/21 2154   insulin aspart (novoLOG) injection 4 Units  4 Units Subcutaneous TID WC Adhikari, Amrit, MD       insulin glargine-yfgn (SEMGLEE) injection 10 Units  10 Units Subcutaneous Daily Shelly Coss, MD   10 Units at 04/16/21 1021   ondansetron (ZOFRAN) tablet 4 mg  4 mg Oral Q6H PRN Athena Masse, MD       Or   ondansetron [facility]) injection 4 mg  4 mg Intravenous Q6H PRN Athena Masse, MD       oxyCODONE-acetaminophen (PERCOCET/ROXICET) 5-325 MG per tablet 1 tablet  1 tablet Oral Q6H PRN Shelly Coss, MD   1 tablet at 04/15/21 2153   pantoprazole (PROTONIX) EC tablet 40 mg  40 mg Oral Daily Shelly Coss, MD   40 mg at 04/16/21 0916   pravastatin (PRAVACHOL) tablet 80 mg  80 mg Oral Daily Shelly Coss, MD   80 mg at 04/16/21 0916   [START ON 04/20/2021] predniSONE (DELTASONE) tablet 10 mg  10 mg Oral Q breakfast Shelly Coss, MD       Derrill Memo ON 04/17/2021] predniSONE (DELTASONE) tablet 20 mg  20 mg Oral Q breakfast Shelly Coss, MD         Discharge Medications: Please see discharge summary for a list of discharge medications.  Relevant Imaging Results:  Relevant Lab Results:   Additional Information SS#: [id number]  Candie Chroman, LCSW

## 2021-04-16 NOTE — Progress Notes (Signed)
Cardiology Progress Note   Patient Name: Katrina Ortiz Date of Encounter: 04/16/2021  Primary Cardiologist: [facility]  Subjective   No chest pain, shortness of breath or leg edema.  She continues to complain of left hand and wrist tenderness.  Inpatient Medications    Scheduled Meds:  amLODipine  10 mg Oral Daily   carvedilol  25 mg Oral BID   clopidogrel  75 mg Oral Daily   DULoxetine  60 mg Oral Daily   enoxaparin (LOVENOX) injection  40 mg Subcutaneous Q24H   furosemide  40 mg Oral BID   gabapentin  300 mg Oral QHS   hydrALAZINE  25 mg Oral Q8H   insulin aspart  0-20 Units Subcutaneous TID WC   insulin aspart  0-5 Units Subcutaneous QHS   insulin glargine-yfgn  10 Units Subcutaneous Daily   pantoprazole  40 mg Oral Daily   pravastatin  80 mg Oral Daily   [START ON 04/20/2021] predniSONE  10 mg Oral Q breakfast   [START ON 04/17/2021] predniSONE  20 mg Oral Q breakfast   Continuous Infusions:  cefTRIAXone (ROCEPHIN)  IV 1 g (04/16/21 0515)   PRN Meds: acetaminophen **OR** acetaminophen, hydrALAZINE, HYDROmorphone (DILAUDID) injection, ondansetron **OR** ondansetron (ZOFRAN) IV, oxyCODONE-acetaminophen   Vital Signs    Vitals:   04/15/21 1928 04/15/21 2323 04/16/21 0416 04/16/21 0817  BP: (!) 120/35 (!) 117/45 134/61 (!) 153/65  Pulse: 80 82 78 82  Resp: 18 16 14 18   Temp: 98.1 F (36.7 C) 97.8 F (36.6 C) 98.9 F (37.2 C) 97.8 F (36.6 C)  TempSrc:      SpO2: 98% 96% 98% 97%  Weight:   97.4 kg   Height:        Intake/Output Summary (Last 24 hours) at 04/16/2021 1133 Last data filed at 04/16/2021 1000 Gross per 24 hour  Intake 820 ml  Output 501 ml  Net 319 ml    Filed Weights   04/13/21 2347 04/15/21 0500 04/16/21 0416  Weight: 94.3 kg 94.7 kg 97.4 kg    Physical Exam   GEN: Obese, in no acute distress.  HEENT: Grossly normal.  Neck: Supple, obese, difficult to gauge JVP.  No carotid bruits, or masses. Cardiac: RRR, no murmurs, rubs, or gallops. No  clubbing, cyanosis, edema.  Radials 2+, DP/PT 2+ and equal bilaterally.  Respiratory:  Respirations regular and unlabored, clear to auscultation bilaterally. GI: Obese, soft, nontender, nondistended, BS + x 4. MS: no deformity or atrophy. Skin: warm and dry, no rash. Neuro:  Strength and sensation are intact. Psych: AAOx3.  Normal affect.  Labs    Chemistry Recent Labs  Lab 04/13/21 2354 04/14/21 0240 04/15/21 0608 04/16/21 0632  NA 133*  --  134* 130*  K 4.6  --  4.8 4.6  CL 103  --  104 102  CO2 20*  --  17* 18*  GLUCOSE 217*  --  245* 315*  BUN 36*  --  40* 59*  CREATININE 2.32* 2.29* 2.38* 2.62*  CALCIUM 8.4*  --  8.2* 7.6*  GFRNONAA 23* 23* 22* 20*  ANIONGAP 10  --  13 10      Hematology Recent Labs  Lab 04/13/21 2354 04/15/21 0608 04/16/21 0632  WBC 11.3* 15.4* 15.2*  RBC 3.61* 3.64* 3.02*  HGB 9.8* 9.8* 8.3*  HCT 30.4* 31.1* 25.5*  MCV 84.2 85.4 84.4  MCH 27.1 26.9 27.5  MCHC 32.2 31.5 32.5  RDW 14.8 15.0 15.2  PLT 161 144* 170  Cardiac Enzymes  Recent Labs  Lab 04/09/21 1328 04/09/21 1839 04/13/21 2354 04/14/21 0240  TROPONINIHS 4 4 9 11        BNP Recent Labs  Lab 04/13/21 2354  BNP 505.2*    HbA1c  Lab Results  Component Value Date   HGBA1C 7.5 (H) 04/14/2021    Radiology    DG Chest 2 View  Result Date: 04/14/2021 CLINICAL DATA:  Dizziness and weakness. EXAM: CHEST - 2 VIEW COMPARISON:  April 09, 2021 FINDINGS: Multiple sternal wires are seen. Decreased lung volumes are noted with mild, diffuse, chronic appearing increased interstitial lung markings. Mild atelectasis is seen along the medial aspect of the left lung base. A very small left pleural effusion is noted. This is mildly decreased in size when compared to the prior exam. No pneumothorax is identified. The cardiac silhouette is mildly enlarged and unchanged in size. Moderate severity calcification of the aortic arch is noted. Degenerative changes are seen throughout  the thoracic spine. IMPRESSION: 1. Evidence of prior median sternotomy/CABG. 2. Chronic appearing increased interstitial lung markings with mild left basilar atelectasis. 3. Small left pleural effusion, mildly decreased in size when compared to the prior study. Electronically Signed   By: Virgina Norfolk M.D.   On: 04/14/2021 00:22   DG Wrist Complete Left  Result Date: 04/14/2021 CLINICAL DATA:  Dizziness, weakness and left arm pain. EXAM: LEFT WRIST - COMPLETE 3+ VIEW COMPARISON:  None. FINDINGS: There is no evidence of fracture or dislocation. Marked severity degenerative changes are seen involving the carpometacarpal articulation of the left thumb. There is mild diffuse soft tissue swelling with a small, thin linear radiopaque soft tissue foreign body seen within the soft tissues adjacent to the lateral aspect of the distal left radius. IMPRESSION: Marked severity degenerative changes, without evidence of an acute osseous abnormality. Electronically Signed   By: Virgina Norfolk M.D.   On: 04/14/2021 00:25   US Venous Img Upper Uni Right(DVT)  Result Date: 04/14/2021 CLINICAL DATA:  Right arm swelling for 1 day EXAM: RIGHT UPPER EXTREMITY VENOUS DOPPLER ULTRASOUND TECHNIQUE: Gray-scale sonography with graded compression, as well as color Doppler and duplex ultrasound were performed to evaluate the upper extremity deep venous system from the level of the subclavian vein and including the jugular, axillary, basilic, radial, ulnar and upper cephalic vein. Spectral Doppler was utilized to evaluate flow at rest and with distal augmentation maneuvers. COMPARISON:  None. FINDINGS: Contralateral Subclavian Vein: Respiratory phasicity is normal and symmetric with the symptomatic side. No evidence of thrombus. Normal compressibility. Internal Jugular Vein: No evidence of thrombus. Subclavian Vein: No evidence of thrombus. Axillary Vein: No evidence of thrombus. Cephalic Vein: Occlusive thrombus at the level of  the antecubital fossa involving a segment at least 3 cm in length. This is in the superficial system. Basilic Vein: No evidence of thrombus. Brachial Veins: No evidence of thrombus. Radial Veins: No evidence of thrombus. Ulnar Veins: No evidence of thrombus. Venous Reflux:  None visualized. IMPRESSION: Occlusive cephalic vein thrombosis in the right antecubital region, reportedly site of prior IV access. Electronically Signed   By: Jorje Guild M.D.   On: 04/14/2021 04:24   ECHOCARDIOGRAM COMPLETE  Result Date: 04/15/2021    ECHOCARDIOGRAM REPORT   Patient Name:   ALILAH MCMEANS Date of Exam: 04/15/2021 Medical Rec #:  [id number]      Height:       63.0 in Accession #:    [id number]     Weight:  208.8 lb Date of Birth:  01-30-1956     BSA:          1.969 m Patient Age:    20 years       BP:           165/68 mmHg Patient Gender: F              HR:           95 bpm. Exam Location:  [facility] Procedure: 2D Echo, Cardiac Doppler, Color Doppler and Intracardiac            Opacification Agent Indications:     E95.28 Acute Diastolic Heart Failure  History:         Patient has prior history of Echocardiogram examinations, most                  recent 04/11/2019. Risk Factors:Hypertension and Diabetes.  Sonographer:     Cresenciano Lick RDCS Referring Phys:  [id number] Kate Sable Diagnosing Phys: Ida Rogue MD IMPRESSIONS  1. Left ventricular ejection fraction, by estimation, is 60 to 65%. The left ventricle has normal function. The left ventricle has no regional wall motion abnormalities. Left ventricular diastolic parameters are consistent with Grade I diastolic dysfunction (impaired relaxation).  2. Right ventricular systolic function is normal. The right ventricular size is normal. Tricuspid regurgitation signal is inadequate for assessing PA pressure.  3. The mitral valve is normal in structure. Mild mitral valve regurgitation. No evidence of mitral stenosis.  4. The aortic valve is normal in  structure. Aortic valve regurgitation is not visualized. No aortic stenosis is present.  5. The inferior vena cava is normal in size with greater than 50% respiratory variability, suggesting right atrial pressure of 3 mmHg. FINDINGS  Left Ventricle: Left ventricular ejection fraction, by estimation, is 60 to 65%. The left ventricle has normal function. The left ventricle has no regional wall motion abnormalities. Definity contrast agent was given IV to delineate the left ventricular  endocardial borders. The left ventricular internal cavity size was normal in size. There is no left ventricular hypertrophy. Left ventricular diastolic parameters are consistent with Grade I diastolic dysfunction (impaired relaxation). Right Ventricle: The right ventricular size is normal. No increase in right ventricular wall thickness. Right ventricular systolic function is normal. Tricuspid regurgitation signal is inadequate for assessing PA pressure. Left Atrium: Left atrial size was normal in size. Right Atrium: Right atrial size was normal in size. Pericardium: There is no evidence of pericardial effusion. Mitral Valve: The mitral valve is normal in structure. Mild mitral valve regurgitation. No evidence of mitral valve stenosis. Tricuspid Valve: The tricuspid valve is normal in structure. Tricuspid valve regurgitation is not demonstrated. No evidence of tricuspid stenosis. Aortic Valve: The aortic valve is normal in structure. Aortic valve regurgitation is not visualized. No aortic stenosis is present. Pulmonic Valve: The pulmonic valve was normal in structure. Pulmonic valve regurgitation is not visualized. No evidence of pulmonic stenosis. Aorta: The aortic root is normal in size and structure. Venous: The inferior vena cava is normal in size with greater than 50% respiratory variability, suggesting right atrial pressure of 3 mmHg. IAS/Shunts: No atrial level shunt detected by color flow Doppler.  LEFT VENTRICLE PLAX 2D LVIDd:          4.40 cm   Diastology LVIDs:         3.00 cm   LV e' medial:    5.72 cm/s LV PW:  1.10 cm   LV E/e' medial:  12.4 LV IVS:        1.00 cm   LV e' lateral:   8.45 cm/s LVOT diam:     1.90 cm   LV E/e' lateral: 8.4 LV SV:         67 LV SV Index:   34 LVOT Area:     2.84 cm  RIGHT VENTRICLE            IVC RV Basal diam:  3.90 cm    IVC diam: 1.90 cm RV S prime:     9.98 cm/s TAPSE (M-mode): 1.7 cm LEFT ATRIUM             Index        RIGHT ATRIUM           Index LA diam:        4.40 cm 2.23 cm/m   RA Area:     15.30 cm LA Vol (A2C):   62.3 ml 31.64 ml/m  RA Volume:   41.80 ml  21.23 ml/m LA Vol (A4C):   42.1 ml 21.38 ml/m LA Biplane Vol: 51.6 ml 26.20 ml/m  AORTIC VALVE LVOT Vmax:   105.00 cm/s LVOT Vmean:  79.000 cm/s LVOT VTI:    0.238 m  AORTA Ao Root diam: 3.00 cm MITRAL VALVE MV Area (PHT): 4.39 cm     SHUNTS MV Decel Time: 173 msec     Systemic VTI:  0.24 m MV E velocity: 70.70 cm/s   Systemic Diam: 1.90 cm MV A velocity: 110.00 cm/s MV E/A ratio:  0.64 Ida Rogue MD Electronically signed by Ida Rogue MD Signature Date/Time: 04/15/2021/1:11:36 PM    Final    CT CHEST ABDOMEN PELVIS WO CONTRAST  Result Date: 04/14/2021 CLINICAL DATA:  Shortness of breath, weakness and nausea. EXAM: CT CHEST, ABDOMEN AND PELVIS WITHOUT CONTRAST TECHNIQUE: Multidetector CT imaging of the chest, abdomen and pelvis was performed following the standard protocol without IV contrast. RADIATION DOSE REDUCTION: This exam was performed according to the departmental dose-optimization program which includes automated exposure control, adjustment of the mA and/or kV according to patient size and/or use of iterative reconstruction technique. COMPARISON:  Chest CT without contrast 04/09/2021, CT abdomen pelvis with IV contrast 02/27/2019 FINDINGS: CT CHEST FINDINGS Cardiovascular: The cardiac size is upper-normal. Pulmonary veins are normal caliber and there is no significant pericardial effusion. There is heavy  native three-vessel calcific CAD with sternotomy changes and old CABG. There is aortic atherosclerosis without aneurysm. The pulmonary arteries are normal caliber. Mediastinum/Nodes: The lower poles of the thyroid gland, trachea, thoracic esophagus are unremarkable. Axillary spaces are clear. No intrathoracic adenopathy is seen. Lungs/Pleura: There is a moderate-sized left and small right layering pleural effusions, both increased. Adjacent consolidation or atelectasis in left lower lobe appears similar to the previous exam as well as posterior atelectasis in the right lower lobe. Scar-like opacities are again seen in the right middle lobe with additional ground-glass pneumonitis or atelectasis posteriorly in the right upper lobe also unchanged in appearance. Left upper lobe is clear except for atelectasis in the lingular base. The central airways are clear. 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 Osteopenia. CT ABDOMEN PELVIS FINDINGS Hepatobiliary: 19 cm length mildly steatotic liver, otherwise unremarkable without contrast. Gallbladder and bile ducts are unremarkable. Pancreas: Partially atrophic, otherwise unremarkable without contrast. Spleen: Normal in size and noncontrast attenuation. Adrenals/Urinary Tract: There is no adrenal mass. Wedge-shaped scar-like defect  noted in the left upper renal cortex, otherwise no focal abnormality in the unenhanced renal cortex is seen. Perinephric stranding is similar to prior study as well as trace perinephric fluid. There is no evidence of urinary stone or obstruction. The bladder wall is thickened but not fully distended and there is air in the bladder. Stomach/Bowel: No dilatation or wall thickening including the appendix. Scattered colonic diverticula without focal inflammation. Vascular/Lymphatic: Aortic atherosclerosis. No enlarged abdominal or pelvic lymph nodes. Reproductive: The uterus is intact. There are BTL  changes and a subserosal 3.1 cm fundal fibroid again noted of the right. No adnexal mass is seen. The endometrium is more prominent than usually seen at this age, measuring 8 mm. A similar finding was seen previously. Nonemergent ultrasound follow-up is recommended. Other: Small umbilical fat hernia. There is trace posterior deep pelvic ascites. There is no free air, hemorrhage or abscess. Musculoskeletal: Advanced facet hypertrophy lower lumbar spine with chronic grade 1 L4-5 degenerative anterolisthesis and acquired spinal stenosis. Similar severe spinal stenosis L3-4. No destructive bone lesion is seen. IMPRESSION: 1. Moderate left and small layering right pleural effusions, both increased from 5 days ago. Adjacent atelectasis or consolidation in the left lower lobe appears similar. 2. Resolution of small ground-glass infiltrates in the posterior left upper lobe previously with similar appearance of ground-glass atelectasis or infiltrates in the posterior right upper lobe. 3. Prior CABG, with upper-normal heart size, aortic and coronary artery atherosclerosis. 4. Cystitis versus bladder nondistention, with air in the bladder which could be due to instrumentation or infectious process. Correlate clinically with urinalysis. 5. Additional chronic findings in the abdomen and pelvis with no other new abnormality. 6. Prominent endometrium for age. A similar finding was noted previously. Nonemergent ultrasound follow-up recommended. 7. Trace ascites. 8. Osteopenia and degenerative change with severe acquired spinal stenosis L3-4 and L4-5. Electronically Signed   By: Telford Nab M.D.   On: 04/14/2021 03:32    Telemetry    RSR - Personally Reviewed  Cardiac Studies   2D Echocardiogram 02.2021    1. Left ventricular ejection fraction, by estimation, is 60 to 65%. The  left ventricle has normal function. The left ventricle has no regional  wall motion abnormalities. Left ventricular diastolic parameters are   consistent with Grade I diastolic  dysfunction (impaired relaxation).   2. Right ventricular systolic function is normal. The right ventricular  size is normal. Tricuspid regurgitation signal is inadequate for assessing  PA pressure.   Patient Profile     66 y.o. female w/ a h/o CAD s/p CABG, HFpEF, HTN, HL, CKD IV, and DMII, who was recently admitted 2/2 hypoglycemia/unresponsiveness/AKI/hypoxia (discharged 2/19), who presented 2/22 w/ nausea, wkns, dyspnea, and bilat arm pain/redness, found to have BNP of 505, nl HsTrops, and pleural effusions.   Assessment & Plan    1.  Acute on chronic HFpEF:  Admitted w/ dyspnea and edema.  The patient received IV furosemide but was switched back to oral furosemide 40 mg twice daily yesterday.  She does appear to be euvolemic.   Continue blood pressure control.  Continue carvedilol and amlodipine.  She was prev on valsartan 40 daily, but this was d/c'd 2/17 in the setting of AKI.  Review of historical creatinines in [facility] records, shows that she's currently around her baseline dating back to 09/2020.  Hydralazine was added yesterday and blood pressure seems to be reasonably controlled.    2.  Cellulitis Bilat upper ext:  Abx per IM.  3.  CAD:  s/p prior CABG.  No chest pain.  HsTrops nl.  Cont med rx - ? blocker and statin. Was also on plavix @ home - resume.  4.   HL:  On pravastatin.  5.  CKD IV:  As above, creat relatively stable.  We will sign off.  Please call with questions.  Signed, Kathlyn Sacramento, MD  04/16/2021, 11:33 AM    For questions or updates, please contact   Please consult [email or web address] for contact info under Cardiology/STEMI.

## 2021-04-16 NOTE — Progress Notes (Signed)
PROGRESS NOTE  Katrina Ortiz  OYD:[id number] DOB: April 10, 1955 DOA: 04/14/2021 PCP: Lowella Bandy, MD   Brief Narrative: Katrina Ortiz is a 66 y.o. female with medical history significant of Diastolic CHF, CAD with history of CABG, HTN, DM hospitalized from 2/17 to 2/19 with unresponsiveness related to hypoglycemia with blood sugar 28 as well as AKI, and hypoxia, resolving with D10 infusion who presents to the ED with nausea, shortness of breath, generalized weakness and also complains of pain and redness of the left wrist and the right antecubital area.  On presentation she was hypertensive.  Lab work showed leukocytosis, elevated BNP.  UA was suspicious for UTI.  Chest x-ray showed bilateral pleural effusion.  Right upper extremity venous Doppler negative for DVT.  Patient was started on ceftriaxone for the cellulitis of right upper extremity,left wrist .  Cardiology consulted for new onset CHF exacerbation.  PT/OT recommending skilled nursing facility on  discharge.  Patient is medically stable for discharge to skilled nursing facility as soon as bed is available  Assessment & Plan:  Principal Problem:   Acute on chronic diastolic CHF (congestive heart failure) (HCC) Active Problems:   Cellulitis of left wrist   Physical deconditioning   UTI (urinary tract infection)   CKD (chronic kidney disease) stage 4, GFR 15-29 ml/min (HCC)   Type 2 diabetes mellitus (HCC)   Cellulitis of right forearm   Anemia of chronic kidney failure, stage 4 (severe) (HCC)   Hx of CABG   Essential hypertension   Assessment and Plan: * Acute on chronic diastolic CHF (congestive heart failure) (HCC) Elevated BNP.  Started on Lasix 20 mg IV twice daily,now chnaged to lasix 40 mg BID PO Continue home carvedilol.  Daily weights with intake and output monitoring Echocardiogram showed EF of 60 to 67%, grade 1 diastolic dysfunction.  Cardiology was following   Cellulitis of left wrist Started on  ceftriaxone. Unclear etiology.  Patient has history of psoriasis.  X-ray of the wrist did not show any fracture or dislocation but showed a small radiopaque in the soft tissue.  She has cellulitis changes on the right wrist with severe tenderness and edema. Elevated ESR, CRP.Pending  rheumatoid factor, ANA.  Uric acid level is normal.  Started on prednisone for possibility of inflammatory arthritis.  Continue pain management, supportive care.  Leukocytosis worsened today, likely secondary to steroids. We also consulted orthopedics .  No plan for further intervention.  Recommended to continue antibiotics.   Physical deconditioning PT/OT recommending skilled nursing  facility on discharge  UTI (urinary tract infection) UA was suspicious for UTI.  IV Rocephin and follow cultures, showing gram-negative rods  CKD (chronic kidney disease) stage 4, GFR 15-29 ml/min (HCC) Creatinine trended up.  Continue to monitor  Essential hypertension Continue Coreg, amlodipine.  Added hydralazine  Hx of CABG Continue aspirin and statins Troponins negative, EKG nonacute and no complaints of chest pain  Anemia of chronic kidney failure, stage 4 (severe) (HCC) Currently hemoglobin stable  Cellulitis of right forearm Possible thrombophlebitis given location and right antecubital fossa, site of location of recent IV injection IV Rocephin Pain control Negative venous ultrasound of right upper extremity for DVT but showed occlusive cephalic vein thrombosis in the right antecubital region   Type 2 diabetes mellitus ([place]) Hyperglycemia likely secondary to steroids.  Continue long-acting and sliding scale              DVT prophylaxis:enoxaparin (LOVENOX) injection 40 mg Start: 04/15/21 0800     Code  Status: Full Code  Family Communication: None at bedside  Patient status:inpatient  Patient is from :home  Anticipated discharge to:SNF  Estimated DC date:as soon as bed is  available   Consultants: cardiology  Procedures:none  Antimicrobials:  Anti-infectives (From admission, onward)    Start     Dose/Rate Route Frequency Ordered Stop   04/15/21 0600  cefTRIAXone (ROCEPHIN) 1 g in sodium chloride 0.9 % 100 mL IVPB        1 g 200 mL/hr over 30 Minutes Intravenous Every 24 hours 04/14/21 0418     04/14/21 0345  cefTRIAXone (ROCEPHIN) 1 g in sodium chloride 0.9 % 100 mL IVPB        1 g 200 mL/hr over 30 Minutes Intravenous  Once 04/14/21 1740 04/14/21 0445       Subjective:  Patient seen and examined at the bedside this morning.  Hemodynamically stable.  She feels better today but she still has significant pain on the wrist. pain is better than yesterday though.  Redness, edema on  the left wrist has significantly improved  Objective: Vitals:   04/15/21 1928 04/15/21 2323 04/16/21 0416 04/16/21 0817  BP: (!) 120/35 (!) 117/45 134/61 (!) 153/65  Pulse: 80 82 78 82  Resp: $Remo'18 16 14 18  'YnZfx$ Temp: 98.1 F (36.7 C) 97.8 F (36.6 C) 98.9 F (37.2 C) 97.8 F (36.6 C)  TempSrc:      SpO2: 98% 96% 98% 97%  Weight:   97.4 kg   Height:        Intake/Output Summary (Last 24 hours) at 04/16/2021 1116 Last data filed at 04/16/2021 1000 Gross per 24 hour  Intake 820 ml  Output 501 ml  Net 319 ml   Filed Weights   04/13/21 2347 04/15/21 0500 04/16/21 0416  Weight: 94.3 kg 94.7 kg 97.4 kg    Examination:  General exam: Overall comfortable today, obese HEENT: PERRL Respiratory system:  no wheezes or crackles  Cardiovascular system: S1 & S2 heard, RRR.  Gastrointestinal system: Abdomen is nondistended, soft and nontender. Central nervous system: Alert and oriented Extremities: No lower extremity edema, thrombophlebitis changes on the right cubital fossa,  tenderness/erythematous/edema of the  left wrist Skin: No rashes, no ulcers,no icterus     Data Reviewed: I have personally reviewed following labs and imaging studies  CBC: Recent Labs  Lab  04/09/21 1328 04/10/21 0653 04/11/21 0640 04/12/21 0449 04/13/21 2354 04/15/21 0608 04/16/21 0632  WBC 7.4   < > 7.4 7.3 11.3* 15.4* 15.2*  NEUTROABS 6.3  --   --   --   --  14.4* 13.3*  HGB 10.0*   < > 8.2* 8.7* 9.8* 9.8* 8.3*  HCT 33.1*   < > 26.2* 28.1* 30.4* 31.1* 25.5*  MCV 87.8   < > 86.2 85.9 84.2 85.4 84.4  PLT 199   < > 163 172 161 144* 170   < > = values in this interval not displayed.   Basic Metabolic Panel: Recent Labs  Lab 04/11/21 0640 04/12/21 0449 04/13/21 2354 04/14/21 0240 04/15/21 0608 04/16/21 0632  NA 140 139 133*  --  134* 130*  K 5.1 4.9 4.6  --  4.8 4.6  CL 112* 109 103  --  104 102  CO2 19* 21* 20*  --  17* 18*  GLUCOSE 124* 118* 217*  --  245* 315*  BUN 41* 40* 36*  --  40* 59*  CREATININE 2.43* 2.36* 2.32* 2.29* 2.38* 2.62*  CALCIUM 8.1* 8.4*  8.4*  --  8.2* 7.6*     Recent Results (from the past 240 hour(s))  Urine Culture     Status: Abnormal   Collection Time: 04/12/21  7:35 PM   Specimen: Urine, Clean Catch  Result Value Ref Range Status   Specimen Description   Final    URINE, CLEAN CATCH Performed at [facility], [address], [place], [place] [address]    Special Requests   Final    NONE Performed at [facility], [place], [place] [address]    Culture >=100,000 COLONIES/mL ESCHERICHIA COLI (A)  Final   Report Status 04/16/2021 FINAL  Final   Organism ID, Bacteria ESCHERICHIA COLI (A)  Final      Susceptibility   Escherichia coli - MIC*    AMPICILLIN <=2 SENSITIVE Sensitive     CEFAZOLIN <=4 SENSITIVE Sensitive     CEFEPIME <=0.12 SENSITIVE Sensitive     CEFTRIAXONE <=0.25 SENSITIVE Sensitive     CIPROFLOXACIN <=0.25 SENSITIVE Sensitive     GENTAMICIN <=1 SENSITIVE Sensitive     IMIPENEM <=0.25 SENSITIVE Sensitive     NITROFURANTOIN <=16 SENSITIVE Sensitive     TRIMETH/SULFA <=20 SENSITIVE Sensitive     AMPICILLIN/SULBACTAM <=2 SENSITIVE Sensitive     PIP/TAZO <=4 SENSITIVE  Sensitive     * >=100,000 COLONIES/mL ESCHERICHIA COLI  Resp Panel by RT-PCR (Flu A&B, Covid) Nasopharyngeal Swab     Status: None   Collection Time: 04/14/21  2:40 AM   Specimen: Nasopharyngeal Swab; Nasopharyngeal(NP) swabs in vial transport medium  Result Value Ref Range Status   SARS Coronavirus 2 by RT PCR NEGATIVE NEGATIVE Final    Comment: (NOTE) SARS-CoV-2 target nucleic acids are NOT DETECTED.  The SARS-CoV-2 RNA is generally detectable in upper respiratory specimens during the acute phase of infection. The lowest concentration of SARS-CoV-2 viral copies this assay can detect is 138 copies/mL. A negative result does not preclude SARS-Cov-2 infection and should not be used as the sole basis for treatment or other patient management decisions. A negative result may occur with  improper specimen collection/handling, submission of specimen other than nasopharyngeal swab, presence of viral mutation(s) within the areas targeted by this assay, and inadequate number of viral copies(<138 copies/mL). A negative result must be combined with clinical observations, patient history, and epidemiological information. The expected result is Negative.  Fact Sheet for Patients:  EntrepreneurPulse.com.au  Fact Sheet for Healthcare Providers:  IncredibleEmployment.be  This test is no t yet approved or cleared by the [place] FDA and  has been authorized for detection and/or diagnosis of SARS-CoV-2 by FDA under an Emergency Use Authorization (EUA). This EUA will remain  in effect (meaning this test can be used) for the duration of the COVID-19 declaration under Section 564(b)(1) of the Act, 21 U.S.C.section 360bbb-3(b)(1), unless the authorization is terminated  or revoked sooner.       Influenza A by PCR NEGATIVE NEGATIVE Final   Influenza B by PCR NEGATIVE NEGATIVE Final    Comment: (NOTE) The Xpert Xpress SARS-CoV-2/FLU/RSV plus assay is  intended as an aid in the diagnosis of influenza from Nasopharyngeal swab specimens and should not be used as a sole basis for treatment. Nasal washings and aspirates are unacceptable for Xpert Xpress SARS-CoV-2/FLU/RSV testing.  Fact Sheet for Patients: EntrepreneurPulse.com.au  Fact Sheet for Healthcare Providers: IncredibleEmployment.be  This test is not yet approved or cleared by the [place] FDA and has been authorized for detection and/or diagnosis of SARS-CoV-2  by FDA under an Emergency Use Authorization (EUA). This EUA will remain in effect (meaning this test can be used) for the duration of the COVID-19 declaration under Section 564(b)(1) of the Act, 21 U.S.C. section 360bbb-3(b)(1), unless the authorization is terminated or revoked.  Performed at [facility], [address], [place], [place] [address]      Radiology Studies: ECHOCARDIOGRAM COMPLETE  Result Date: 04/15/2021    ECHOCARDIOGRAM REPORT   Patient Name:   Katrina Ortiz Date of Exam: 04/15/2021 Medical Rec #:  [id number]      Height:       63.0 in Accession #:    [id number]     Weight:       208.8 lb Date of Birth:  December 15, 1955     BSA:          1.969 m Patient Age:    65 years       BP:           165/68 mmHg Patient Gender: F              HR:           95 bpm. Exam Location:  [facility] Procedure: 2D Echo, Cardiac Doppler, Color Doppler and Intracardiac            Opacification Agent Indications:     J62.83 Acute Diastolic Heart Failure  History:         Patient has prior history of Echocardiogram examinations, most                  recent 04/11/2019. Risk Factors:Hypertension and Diabetes.  Sonographer:     Cresenciano Lick RDCS Referring Phys:  [id number] Kate Sable Diagnosing Phys: Ida Rogue MD IMPRESSIONS  1. Left ventricular ejection fraction, by estimation, is 60 to 65%. The left ventricle has normal function. The left ventricle has no regional wall motion  abnormalities. Left ventricular diastolic parameters are consistent with Grade I diastolic dysfunction (impaired relaxation).  2. Right ventricular systolic function is normal. The right ventricular size is normal. Tricuspid regurgitation signal is inadequate for assessing PA pressure.  3. The mitral valve is normal in structure. Mild mitral valve regurgitation. No evidence of mitral stenosis.  4. The aortic valve is normal in structure. Aortic valve regurgitation is not visualized. No aortic stenosis is present.  5. The inferior vena cava is normal in size with greater than 50% respiratory variability, suggesting right atrial pressure of 3 mmHg. FINDINGS  Left Ventricle: Left ventricular ejection fraction, by estimation, is 60 to 65%. The left ventricle has normal function. The left ventricle has no regional wall motion abnormalities. Definity contrast agent was given IV to delineate the left ventricular  endocardial borders. The left ventricular internal cavity size was normal in size. There is no left ventricular hypertrophy. Left ventricular diastolic parameters are consistent with Grade I diastolic dysfunction (impaired relaxation). Right Ventricle: The right ventricular size is normal. No increase in right ventricular wall thickness. Right ventricular systolic function is normal. Tricuspid regurgitation signal is inadequate for assessing PA pressure. Left Atrium: Left atrial size was normal in size. Right Atrium: Right atrial size was normal in size. Pericardium: There is no evidence of pericardial effusion. Mitral Valve: The mitral valve is normal in structure. Mild mitral valve regurgitation. No evidence of mitral valve stenosis. Tricuspid Valve: The tricuspid valve is normal in structure. Tricuspid valve regurgitation is not demonstrated. No evidence of tricuspid stenosis. Aortic Valve: The aortic valve is normal in structure.  Aortic valve regurgitation is not visualized. No aortic stenosis is present.  Pulmonic Valve: The pulmonic valve was normal in structure. Pulmonic valve regurgitation is not visualized. No evidence of pulmonic stenosis. Aorta: The aortic root is normal in size and structure. Venous: The inferior vena cava is normal in size with greater than 50% respiratory variability, suggesting right atrial pressure of 3 mmHg. IAS/Shunts: No atrial level shunt detected by color flow Doppler.  LEFT VENTRICLE PLAX 2D LVIDd:         4.40 cm   Diastology LVIDs:         3.00 cm   LV e' medial:    5.72 cm/s LV PW:         1.10 cm   LV E/e' medial:  12.4 LV IVS:        1.00 cm   LV e' lateral:   8.45 cm/s LVOT diam:     1.90 cm   LV E/e' lateral: 8.4 LV SV:         67 LV SV Index:   34 LVOT Area:     2.84 cm  RIGHT VENTRICLE            IVC RV Basal diam:  3.90 cm    IVC diam: 1.90 cm RV S prime:     9.98 cm/s TAPSE (M-mode): 1.7 cm LEFT ATRIUM             Index        RIGHT ATRIUM           Index LA diam:        4.40 cm 2.23 cm/m   RA Area:     15.30 cm LA Vol (A2C):   62.3 ml 31.64 ml/m  RA Volume:   41.80 ml  21.23 ml/m LA Vol (A4C):   42.1 ml 21.38 ml/m LA Biplane Vol: 51.6 ml 26.20 ml/m  AORTIC VALVE LVOT Vmax:   105.00 cm/s LVOT Vmean:  79.000 cm/s LVOT VTI:    0.238 m  AORTA Ao Root diam: 3.00 cm MITRAL VALVE MV Area (PHT): 4.39 cm     SHUNTS MV Decel Time: 173 msec     Systemic VTI:  0.24 m MV E velocity: 70.70 cm/s   Systemic Diam: 1.90 cm MV A velocity: 110.00 cm/s MV E/A ratio:  0.64 Ida Rogue MD Electronically signed by Ida Rogue MD Signature Date/Time: 04/15/2021/1:11:36 PM    Final     Scheduled Meds:  amLODipine  10 mg Oral Daily   carvedilol  25 mg Oral BID   clopidogrel  75 mg Oral Daily   DULoxetine  60 mg Oral Daily   enoxaparin (LOVENOX) injection  40 mg Subcutaneous Q24H   furosemide  40 mg Oral BID   gabapentin  300 mg Oral QHS   hydrALAZINE  25 mg Oral Q8H   insulin aspart  0-20 Units Subcutaneous TID WC   insulin aspart  0-5 Units Subcutaneous QHS   insulin  glargine-yfgn  10 Units Subcutaneous Daily   pantoprazole  40 mg Oral Daily   pravastatin  80 mg Oral Daily   [START ON 04/20/2021] predniSONE  10 mg Oral Q breakfast   [START ON 04/17/2021] predniSONE  20 mg Oral Q breakfast   Continuous Infusions:  cefTRIAXone (ROCEPHIN)  IV 1 g (04/16/21 0515)     LOS: 2 days   Shelly Coss, MD [facility] P2/24/2023, 11:16 AM

## 2021-04-16 NOTE — Progress Notes (Addendum)
Physical Therapy Treatment Patient Details Name: Katrina Ortiz MRN: [id number] DOB: 22-Feb-1955 Today's Date: 04/16/2021   History of Present Illness Katrina Ortiz is a 66 y.o. female with medical history significant of Diastolic CHF, CAD with history of CABG, HTN, DM hospitalized from 2/17 to 2/19 with unresponsiveness related to hypoglycemia, AKI, and hypoxia. Pt presents to the ED with nausea, shortness of breath, generalized weakness and also complains of pain and redness of the left wrist and the right antecubital area. Negative venous ultrasound of right upper extremity for DVT but showed occlusive cephalic vein thrombosis in the right antecubital region    PT Comments    Pt awake and alert resting in bed upon PT entrance into room today. She reports current L wrist pain is 7/10 at rest. She is able to perform bed mobility w/ CGA due to L wrist pain. Once seated EOB, she was able to stand w/ CGA using RW and left platform. She was able to ambulate ~42ft w/ CGA using RW before increased c/o fatigue and L wrist pain. Pt required 2L of O2 throughout session, and does report she uses PRN at night at her baseline. Pt will benefit from continued skilled PT in order to improve LE strength, mobility, gait, endurance, decrease c/o pain, and restore PLOF. Current discharge recommendation remains appropriate due to the level of assistance required by the patient as well as deconditioning/decreased activity tolerance to ensure safety and improve overall function.   Recommendations for follow up therapy are one component of a multi-disciplinary discharge planning process, led by the attending physician.  Recommendations may be updated based on patient status, additional functional criteria and insurance authorization.  Follow Up Recommendations  Skilled nursing-short term rehab (<3 hours/day)     Assistance Recommended at Discharge Intermittent Supervision/Assistance  Patient can return home with the  following Assistance with feeding;Assistance with cooking/housework;Help with stairs or ramp for entrance;A little help with walking and/or transfers;A little help with bathing/dressing/bathroom;Assist for transportation   Equipment Recommendations  Other (comment) (left platform for rolling walker)    Recommendations for Other Services       Precautions / Restrictions Precautions Precautions: Fall Restrictions Weight Bearing Restrictions: No Other Position/Activity Restrictions: L wrist treated [place] for comfort     Mobility  Bed Mobility Overal bed mobility: Needs Assistance Bed Mobility: Supine to Sit     Supine to sit: Min guard     General bed mobility comments: CGA for L UE support due to pain    Transfers Overall transfer level: Needs assistance Equipment used: Rolling walker (2 wheels) Transfers: Sit to/from Stand Sit to Stand: Min guard                Ambulation/Gait Ambulation/Gait assistance: Counsellor (Feet): 30 Feet               Stairs             Wheelchair Mobility    Modified Rankin (Stroke Patients Only)       Balance Overall balance assessment: Needs assistance Sitting-balance support: Feet supported Sitting balance-Leahy Scale: Good     Standing balance support: Bilateral upper extremity supported, During functional activity Standing balance-Leahy Scale: Fair                              Cognition Arousal/Alertness: Awake/alert Behavior During Therapy: WFL for tasks assessed/performed Overall Cognitive Status: Within Functional Limits for tasks assessed  Exercises      General Comments        Pertinent Vitals/Pain Pain Assessment Pain Assessment: 0-10 Pain Score: 7  Pain Location: L wrist Pain Descriptors / Indicators: Aching, Discomfort, Dull, Grimacing Pain Intervention(s): Monitored during session, Limited activity  within patient's tolerance, Repositioned    Home Living Family/patient expects to be discharged to:: Private residence Living Arrangements: Spouse/significant other                      Prior Function            PT Goals (current goals can now be found in the care plan section) Progress towards PT goals: Progressing toward goals    Frequency    Min 2X/week      PT Plan Current plan remains appropriate    Co-evaluation              AM-PAC PT "6 Clicks" Mobility   Outcome Measure  Help needed turning from your back to your side while in a flat bed without using bedrails?: A Little Help needed moving from lying on your back to sitting on the side of a flat bed without using bedrails?: A Little Help needed moving to and from a bed to a chair (including a wheelchair)?: A Little Help needed standing up from a chair using your arms (e.g., wheelchair or bedside chair)?: A Little Help needed to walk in hospital room?: A Little Help needed climbing 3-5 steps with a railing? : A Lot 6 Click Score: 17    End of Session Equipment Utilized During Treatment: Gait belt;Oxygen Activity Tolerance: Patient limited by fatigue;Patient tolerated treatment well Patient left: in chair;with call bell/phone within reach;with chair alarm set Nurse Communication: Mobility status PT Visit Diagnosis: Unsteadiness on feet (R26.81);Other abnormalities of gait and mobility (R26.89);Muscle weakness (generalized) (M62.81);Pain;Difficulty in walking, not elsewhere classified (R26.2) Pain - Right/Left: Left Pain - part of body: Hand;Arm     Time: 7169-6789 PT Time Calculation (min) (ACUTE ONLY): 29 min  Charges:                         Jonnie Kind, SPT 04/16/2021, 4:42 PM

## 2021-04-16 NOTE — Assessment & Plan Note (Signed)
Continue Coreg, amlodipine.  Added hydralazine

## 2021-04-16 NOTE — TOC Initial Note (Signed)
Transition of Care [facility]) - Initial/Assessment Note    Patient Details  Name: Katrina Ortiz MRN: [id number] Date of Birth: 01/16/1956  Transition of Care [facility]) CM/SW Contact:    Candie Chroman, LCSW Phone Number: 04/16/2021, 10:44 AM  Clinical Narrative:  Readmission prevention screen complete. CSW met with patient. No supports at bedside. CSW introduced role and explained that discharge planning would be discussed. PCP is Mammie Lorenzo, MD at [facility]. She drives herself to appointments. [facility] [place]. No issues obtaining medications. No home health prior to admission. She has a RW and shower chair at home. She also has a bedside commode available at home but has never had to use it. Patient is on prn QHS oxygen at home through [facility]. PT recommending SNF placement. Patient said they are supposed to come by today to work with her using left platform for walker. She wants to wait and see how she does today before agreeing to SNF referral. If she does go to SNF, first preference is [facility]. No further concerns. CSW encouraged patient to contact CSW as needed. CSW will continue to follow patient for support and facilitate discharge once medically stable.                Expected Discharge Plan:  (SNF vs HH) Barriers to Discharge: Continued Medical Work up   Patient Goals and CMS Choice   CMS Medicare.gov Compare Post Acute Care list provided to:: Patient    Expected Discharge Plan and Services Expected Discharge Plan:  (SNF vs [facility])     Post Acute Care Choice:  (TBD) Living arrangements for the past 2 months: Single Family Home                                      Prior Living Arrangements/Services Living arrangements for the past 2 months: Single Family Home Lives with:: Self Patient language and need for interpreter reviewed:: Yes Do you feel safe going back to the place where you live?: Yes      Need for Family  Participation in Patient Care: Yes (Comment) Care giver support system in place?: Yes (comment) Current home services: DME Criminal Activity/Legal Involvement Pertinent to Current Situation/Hospitalization: No - Comment as needed  Activities of Daily Living Home Assistive Devices/Equipment: Gilford Rile (specify type) ADL Screening (condition at time of admission) Patient's cognitive ability adequate to safely complete daily activities?: Yes Is the patient deaf or have difficulty hearing?: No Does the patient have difficulty seeing, even when wearing glasses/contacts?: No Does the patient have difficulty concentrating, remembering, or making decisions?: No Patient able to express need for assistance with ADLs?: Yes Does the patient have difficulty dressing or bathing?: No Independently performs ADLs?: Yes (appropriate for developmental age) Does the patient have difficulty walking or climbing stairs?: Yes Weakness of Legs: None Weakness of Arms/Hands: None  Permission Sought/Granted                  Emotional Assessment Appearance:: Appears stated age Attitude/Demeanor/Rapport: Engaged, Gracious Affect (typically observed): Accepting, Appropriate, Calm, Pleasant Orientation: : Oriented to Self, Oriented to Place, Oriented to  Time, Oriented to Situation Alcohol / Substance Use: Not Applicable Psych Involvement: No (comment)  Admission diagnosis:  Diarrhea of presumed infectious origin [R19.7] Arm swelling [M79.89] CHF exacerbation (HCC) [I50.9] Acute cystitis with hematuria [N30.01] Acute respiratory failure with hypoxia (HCC) [J96.01] Pain and swelling  of left wrist [M25.532, M25.432] Superficial thrombophlebitis of right upper extremity [I80.8] Acute on chronic congestive heart failure, unspecified heart failure type [facility]) [I50.9] Patient Active Problem List   Diagnosis Date Noted   Essential hypertension 04/16/2021   CHF exacerbation ([place]) 04/14/2021   Cellulitis of left  wrist    UTI (urinary tract infection)    Cellulitis of right forearm    CKD (chronic kidney disease) stage 4, GFR 15-29 ml/min (HCC)    Anemia of chronic kidney failure, stage 4 (severe) (HCC)    Hx of CABG    Physical deconditioning    Nausea and vomiting 04/11/2021   SOB (shortness of breath)    AKI (acute kidney injury) ([place]) 04/09/2021   Acute on chronic diastolic CHF (congestive heart failure) ([place]) 04/09/2021   Hypothermia 04/09/2021   Type 2 diabetes mellitus ([place]) 01/60/1093   Acute diastolic CHF (congestive heart failure) ([place]) 04/14/2019   Hypoglycemia 04/14/2019   Acute respiratory failure ([place]) 04/10/2019   Coronary artery disease 05/23/2010   PCP:  Lowella Bandy, MD [facility]   [id number] [place] [id number]: [phone]: [phone]    Social Determinants of Health (SDOH) Interventions    Readmission Risk Interventions Readmission Risk Prevention Plan 04/16/2021  Transportation Screening Complete  PCP or Specialist Appt within 3-5 Days Complete  Social Work Consult for RAntonitoPlanning/Counseling Complete  Palliative Care Screening Not Applicable  Medication Review (Press photographer Complete  Some recent data might be hidden

## 2021-04-17 LAB — BASIC METABOLIC PANEL
Anion gap: 11 (ref 5–15)
BUN: 74 mg/dL — ABNORMAL HIGH (ref 8–23)
CO2: 20 mmol/L — ABNORMAL LOW (ref 22–32)
Calcium: 8 mg/dL — ABNORMAL LOW (ref 8.9–10.3)
Chloride: 101 mmol/L (ref 98–111)
Creatinine, Ser: 2.49 mg/dL — ABNORMAL HIGH (ref 0.44–1.00)
GFR, Estimated: 21 mL/min — ABNORMAL LOW (ref >60)
Glucose, Bld: 138 mg/dL — ABNORMAL HIGH (ref 70–99)
Potassium: 4.9 mmol/L (ref 3.5–5.1)
Sodium: 132 mmol/L — ABNORMAL LOW (ref 135–145)

## 2021-04-17 LAB — CBC WITH DIFFERENTIAL/PLATELET
Abs Immature Granulocytes: 0.16 10*3/uL — ABNORMAL HIGH (ref 0.00–0.07)
Basophils Absolute: 0 10*3/uL (ref 0.0–0.1)
Basophils Relative: 0 %
Eosinophils Absolute: 0 10*3/uL (ref 0.0–0.5)
Eosinophils Relative: 0 %
HCT: 27.7 % — ABNORMAL LOW (ref 36.0–46.0)
Hemoglobin: 8.7 g/dL — ABNORMAL LOW (ref 12.0–15.0)
Immature Granulocytes: 1 %
Lymphocytes Relative: 3 %
Lymphs Abs: 0.5 10*3/uL — ABNORMAL LOW (ref 0.7–4.0)
MCH: 26.8 pg (ref 26.0–34.0)
MCHC: 31.4 g/dL (ref 30.0–36.0)
MCV: 85.2 fL (ref 80.0–100.0)
Monocytes Absolute: 0.6 10*3/uL (ref 0.1–1.0)
Monocytes Relative: 5 %
Neutro Abs: 12.7 10*3/uL — ABNORMAL HIGH (ref 1.7–7.7)
Neutrophils Relative %: 91 %
Platelets: 208 10*3/uL (ref 150–400)
RBC: 3.25 MIL/uL — ABNORMAL LOW (ref 3.87–5.11)
RDW: 14.9 % (ref 11.5–15.5)
WBC: 14 10*3/uL — ABNORMAL HIGH (ref 4.0–10.5)
nRBC: 0 % (ref 0.0–0.2)

## 2021-04-17 LAB — GLUCOSE, CAPILLARY
Glucose-Capillary: 166 mg/dL — ABNORMAL HIGH (ref 70–99)
Glucose-Capillary: 224 mg/dL — ABNORMAL HIGH (ref 70–99)
Glucose-Capillary: 272 mg/dL — ABNORMAL HIGH (ref 70–99)
Glucose-Capillary: 273 mg/dL — ABNORMAL HIGH (ref 70–99)

## 2021-04-17 MED ORDER — CEPHALEXIN 500 MG PO CAPS
500.0000 mg | ORAL_CAPSULE | Freq: Three times a day (TID) | ORAL | Status: DC
  Administered 2021-04-18: 500 mg via ORAL
  Filled 2021-04-17: qty 1

## 2021-04-17 NOTE — Progress Notes (Signed)
Patient ID: Katrina Ortiz, female   DOB: September 21, 1955, 66 y.o.   MRN: [id number]  Subjective: The patient notes further improvement in her left wrist symptoms this morning.  She continues to work on actively moving her wrist and fingers to try to improve her range of motion and function.  She was able to tolerate ambulation in the room with her platform walker, but notes that her endurance was limited due to her breathing.  She has no new complaints regarding her wrist or hand.   Objective: Vital signs in last 24 hours: Temp:  [97.6 F (36.4 C)-98.4 F (36.9 C)] 97.9 F (36.6 C) (02/25 0813) Pulse Rate:  [68-82] 70 (02/25 0813) Resp:  [16-18] 18 (02/25 0813) BP: (131-153)/(43-65) 140/56 (02/25 0813) SpO2:  [97 %-100 %] 99 % (02/25 0813) Weight:  [95.8 kg] 95.8 kg (02/25 0508)  Intake/Output from previous day: 02/24 0701 - 02/25 0700 In: 720 [P.O.:720] Out: 503 [Urine:503] Intake/Output this shift: No intake/output data recorded.  Recent Labs    04/15/21 0608 04/16/21 0632 04/17/21 0332  HGB 9.8* 8.3* 8.7*   Recent Labs    04/16/21 0632 04/17/21 0332  WBC 15.2* 14.0*  RBC 3.02* 3.25*  HCT 25.5* 27.7*  PLT 170 208   Recent Labs    04/16/21 0632 04/17/21 0332  NA 130* 132*  K 4.6 4.9  CL 102 101  CO2 18* 20*  BUN 59* 74*  CREATININE 2.62* 2.49*  GLUCOSE 315* 138*  CALCIUM 7.6* 8.0*   No results for input(s): LABPT, INR in the last 72 hours.  Physical Exam: Orthopedic examination again is limited to the left wrist and hand.  The swelling and erythema over the dorsal aspect of her wrist continues to improve.  She has only mild tenderness to palpation over the dorsal aspect of the wrist.  She is able to active flexion and then her fingers freely and is almost able to make a fist.  She also is able to actively flex and extend her wrist through an arc of 35 to 40 degrees, again without any discomfort.  She also is now radially and ulnarly deviating her wrist actively  without pain.  She remains neurovascularly intact to the left hand.  Assessment: Left dorsal wrist cellulitis, improving symptomatically.  Plan: The treatment options have been reviewed to the patient.  I recommend that she continue to receive IV antibiotics for another 24 to 48 hours, then be converted to p.o. antibiotics such as Keflex or doxycycline.  She may continue to be mobilized with physical therapy, limiting weightbearing through the left wrist and hand with her platform walker.   Katrina Ortiz 04/17/2021, 8:14 AM

## 2021-04-17 NOTE — Progress Notes (Signed)
PROGRESS NOTE  Katrina Ortiz  AYO:[id number] DOB: 12-21-1955 DOA: 04/14/2021 PCP: Lowella Bandy, MD   Brief Narrative: Katrina Ortiz is a 66 y.o. female with medical history significant of Diastolic CHF, CAD with history of CABG, HTN, DM hospitalized from 2/17 to 2/19 with unresponsiveness related to hypoglycemia with blood sugar 28 as well as AKI, and hypoxia, resolving with D10 infusion who presents to the ED with nausea, shortness of breath, generalized weakness and also complains of pain and redness of the left wrist and the right antecubital area.  On presentation she was hypertensive.  Lab work showed leukocytosis, elevated BNP.  UA was suspicious for UTI.  Chest x-ray showed bilateral pleural effusion.  Right upper extremity venous Doppler negative for DVT.  Patient was started on ceftriaxone for the cellulitis of right upper extremity,left wrist .  Cardiology consulted for new onset CHF exacerbation.  PT/OT recommending skilled nursing facility on  discharge.  Patient is medically stable for discharge to skilled nursing facility as soon as bed is available  Assessment & Plan:  Principal Problem:   Acute on chronic diastolic CHF (congestive heart failure) (HCC) Active Problems:   Cellulitis of left wrist   Physical deconditioning   UTI (urinary tract infection)   CKD (chronic kidney disease) stage 4, GFR 15-29 ml/min (HCC)   Type 2 diabetes mellitus (HCC)   Cellulitis of right forearm   Anemia of chronic kidney failure, stage 4 (severe) (HCC)   Hx of CABG   Essential hypertension   Assessment and Plan: * Acute on chronic diastolic CHF (congestive heart failure) (HCC) Elevated BNP.  Started on Lasix 20 mg IV twice daily,now chnaged to lasix 40 mg BID PO Continue home carvedilol.  Daily weights with intake and output monitoring Echocardiogram showed EF of 60 to 23%, grade 1 diastolic dysfunction.  Cardiology was following   Cellulitis of left wrist Started on  ceftriaxone. Unclear etiology.  Patient has history of psoriasis.  X-ray of the wrist did not show any fracture or dislocation but showed a small radiopaque in the soft tissue.  She has cellulitis changes on the right wrist with severe tenderness and edema. Elevated ESR, CRP.Pending  rheumatoid factor, ANA.  Uric acid level is normal.  Started on prednisone for possibility of inflammatory arthritis, now being tapered.  Continue pain management, supportive care.  Leukocytosis is likely secondary to steroids, improving. We also consulted orthopedics .  No plan for further intervention.  Recommended to continue antibiotics.  Antibiotics changed to oral today   Physical deconditioning PT/OT recommending skilled nursing  facility on discharge  UTI (urinary tract infection) UA was suspicious for UTI.  Urine culture shows pansensitive E. coli.  On Keflex  CKD (chronic kidney disease) stage 4, GFR 15-29 ml/min (HCC) Creatinine trended up.  Continue to monitor  Essential hypertension Continue Coreg, amlodipine.  Added hydralazine  Hx of CABG Continue aspirin and statins Troponins negative, EKG nonacute and no complaints of chest pain  Anemia of chronic kidney failure, stage 4 (severe) (HCC) Currently hemoglobin stable  Cellulitis of right forearm Possible thrombophlebitis given location and right antecubital fossa, site of location of recent IV injection IV Rocephin Pain control Negative venous ultrasound of right upper extremity for DVT but showed occlusive cephalic vein thrombosis in the right antecubital region   Type 2 diabetes mellitus ([place]) Hyperglycemia likely secondary to steroids.  Continue long-acting and sliding scale              DVT prophylaxis:enoxaparin (LOVENOX) injection  30 mg Start: 04/17/21 0800     Code Status: Full Code  Family Communication: Called and discussed with the son on phone on 2/25  Patient status:inpatient  Patient is from  :home  Anticipated discharge to:SNF  Estimated DC date:as soon as bed is available   Consultants: cardiology  Procedures:none  Antimicrobials:  Anti-infectives (From admission, onward)    Start     Dose/Rate Route Frequency Ordered Stop   04/15/21 0600  cefTRIAXone (ROCEPHIN) 1 g in sodium chloride 0.9 % 100 mL IVPB        1 g 200 mL/hr over 30 Minutes Intravenous Every 24 hours 04/14/21 0418     04/14/21 0345  cefTRIAXone (ROCEPHIN) 1 g in sodium chloride 0.9 % 100 mL IVPB        1 g 200 mL/hr over 30 Minutes Intravenous  Once 04/14/21 8144 04/14/21 0445       Subjective:  Patient seen and examined at the bedside this morning.  Hemodynamically stable.  Looks comfortable today.  Denies any new complaints.  Left wrist cellulitis looking significantly better.  Objective: Vitals:   04/16/21 1937 04/16/21 2341 04/17/21 0508 04/17/21 0813  BP: (!) 136/49 (!) 134/49 (!) 131/43 (!) 140/56  Pulse: 71 73 71 70  Resp: _0 Temp: 97.6 F (36.4 C) 97.6 F (36.4 C) 97.6 F (36.4 C) 97.9 F (36.6 C)  TempSrc:      SpO2: 99% 99% 98% 99%  Weight:   95.8 kg   Height:        Intake/Output Summary (Last 24 hours) at 04/17/2021 0815 Last data filed at 04/16/2021 2006 Gross per 24 hour  Intake 720 ml  Output 503 ml  Net 217 ml   Filed Weights   04/15/21 0500 04/16/21 0416 04/17/21 0508  Weight: 94.7 kg 97.4 kg 95.8 kg    Examination:   General exam: Overall comfortable, not in distress,obese HEENT: PERRL Respiratory system:  no wheezes or crackles  Cardiovascular system: S1 & S2 heard, RRR.  Gastrointestinal system: Abdomen is nondistended, soft and nontender. Central nervous system: Alert and oriented Extremities: No edema, no clubbing ,no cyanosis, significantly improved cellulitis on the left wrist Skin: No rashes, no ulcers,no icterus    Data Reviewed: I have personally reviewed following labs and imaging studies  CBC: Recent Labs  Lab 04/12/21 0449  04/13/21 2354 04/15/21 0608 04/16/21 0632 04/17/21 0332  WBC 7.3 11.3* 15.4* 15.2* 14.0*  NEUTROABS  --   --  14.4* 13.3* 12.7*  HGB 8.7* 9.8* 9.8* 8.3* 8.7*  HCT 28.1* 30.4* 31.1* 25.5* 27.7*  MCV 85.9 84.2 85.4 84.4 85.2  PLT 172 161 144* 170 818   Basic Metabolic Panel: Recent Labs  Lab 04/12/21 0449 04/13/21 2354 04/14/21 0240 04/15/21 0608 04/16/21 0632 04/17/21 0332  NA 139 133*  --  134* 130* 132*  K 4.9 4.6  --  4.8 4.6 4.9  CL 109 103  --  104 102 101  CO2 21* 20*  --  17* 18* 20*  GLUCOSE 118* 217*  --  245* 315* 138*  BUN 40* 36*  --  40* 59* 74*  CREATININE 2.36* 2.32* 2.29* 2.38* 2.62* 2.49*  CALCIUM 8.4* 8.4*  --  8.2* 7.6* 8.0*     Recent Results (from the past 240 hour(s))  Urine Culture     Status: Abnormal   Collection Time: 04/12/21  7:35 PM   Specimen: Urine, Clean Catch  Result Value Ref Range Status  Specimen Description   Final    URINE, CLEAN CATCH Performed at [facility], [address] [place], [place] [address]    Special Requests   Final    NONE Performed at [facility], [address] [place],  [address]    Culture >=100,000 COLONIES/mL ESCHERICHIA COLI (A)  Final   Report Status 04/16/2021 FINAL  Final   Organism ID, Bacteria ESCHERICHIA COLI (A)  Final      Susceptibility   Escherichia coli - MIC*    AMPICILLIN <=2 SENSITIVE Sensitive     CEFAZOLIN <=4 SENSITIVE Sensitive     CEFEPIME <=0.12 SENSITIVE Sensitive     CEFTRIAXONE <=0.25 SENSITIVE Sensitive     CIPROFLOXACIN <=0.25 SENSITIVE Sensitive     GENTAMICIN <=1 SENSITIVE Sensitive     IMIPENEM <=0.25 SENSITIVE Sensitive     NITROFURANTOIN <=16 SENSITIVE Sensitive     TRIMETH/SULFA <=20 SENSITIVE Sensitive     AMPICILLIN/SULBACTAM <=2 SENSITIVE Sensitive     PIP/TAZO <=4 SENSITIVE Sensitive     * >=100,000 COLONIES/mL ESCHERICHIA COLI  Resp Panel by RT-PCR (Flu A&B, Covid) Nasopharyngeal Swab     Status: None   Collection Time: 04/14/21   2:40 AM   Specimen: Nasopharyngeal Swab; Nasopharyngeal(NP) swabs in vial transport medium  Result Value Ref Range Status   SARS Coronavirus 2 by RT PCR NEGATIVE NEGATIVE Final    Comment: (NOTE) SARS-CoV-2 target nucleic acids are NOT DETECTED.  The SARS-CoV-2 RNA is generally detectable in upper respiratory specimens during the acute phase of infection. The lowest concentration of SARS-CoV-2 viral copies this assay can detect is 138 copies/mL. A negative result does not preclude SARS-Cov-2 infection and should not be used as the sole basis for treatment or other patient management decisions. A negative result may occur with  improper specimen collection/handling, submission of specimen other than nasopharyngeal swab, presence of viral mutation(s) within the areas targeted by this assay, and inadequate number of viral copies(<138 copies/mL). A negative result must be combined with clinical observations, patient history, and epidemiological information. The expected result is Negative.  Fact Sheet for Patients:  EntrepreneurPulse.com.au  Fact Sheet for Healthcare Providers:  IncredibleEmployment.be  This test is no t yet approved or cleared by the [place] FDA and  has been authorized for detection and/or diagnosis of SARS-CoV-2 by FDA under an Emergency Use Authorization (EUA). This EUA will remain  in effect (meaning this test can be used) for the duration of the COVID-19 declaration under Section 564(b)(1) of the Act, 21 U.S.C.section 360bbb-3(b)(1), unless the authorization is terminated  or revoked sooner.       Influenza A by PCR NEGATIVE NEGATIVE Final   Influenza B by PCR NEGATIVE NEGATIVE Final    Comment: (NOTE) The Xpert Xpress SARS-CoV-2/FLU/RSV plus assay is intended as an aid in the diagnosis of influenza from Nasopharyngeal swab specimens and should not be used as a sole basis for treatment. Nasal washings and aspirates  are unacceptable for Xpert Xpress SARS-CoV-2/FLU/RSV testing.  Fact Sheet for Patients: EntrepreneurPulse.com.au  Fact Sheet for Healthcare Providers: IncredibleEmployment.be  This test is not yet approved or cleared by the [place] FDA and has been authorized for detection and/or diagnosis of SARS-CoV-2 by FDA under an Emergency Use Authorization (EUA). This EUA will remain in effect (meaning this test can be used) for the duration of the COVID-19 declaration under Section 564(b)(1) of the Act, 21 U.S.C. section 360bbb-3(b)(1), unless the authorization is terminated or revoked.  Performed at [employer]  [facility], [address] [place], [place] [address]      Radiology Studies: ECHOCARDIOGRAM COMPLETE  Result Date: 04/15/2021    ECHOCARDIOGRAM REPORT   Patient Name:   Katrina Ortiz Date of Exam: 04/15/2021 Medical Rec #:  [id number]      Height:       63.0 in Accession #:    [id number]     Weight:       208.8 lb Date of Birth:  1955-03-25     BSA:          1.969 m Patient Age:    17 years       BP:           165/68 mmHg Patient Gender: F              HR:           95 bpm. Exam Location:  [facility] Procedure: 2D Echo, Cardiac Doppler, Color Doppler and Intracardiac            Opacification Agent Indications:     B51.02 Acute Diastolic Heart Failure  History:         Patient has prior history of Echocardiogram examinations, most                  recent 04/11/2019. Risk Factors:Hypertension and Diabetes.  Sonographer:     Cresenciano Lick RDCS Referring Phys:  [id number] Kate Sable Diagnosing Phys: Ida Rogue MD IMPRESSIONS  1. Left ventricular ejection fraction, by estimation, is 60 to 65%. The left ventricle has normal function. The left ventricle has no regional wall motion abnormalities. Left ventricular diastolic parameters are consistent with Grade I diastolic dysfunction (impaired relaxation).  2. Right ventricular systolic function is  normal. The right ventricular size is normal. Tricuspid regurgitation signal is inadequate for assessing PA pressure.  3. The mitral valve is normal in structure. Mild mitral valve regurgitation. No evidence of mitral stenosis.  4. The aortic valve is normal in structure. Aortic valve regurgitation is not visualized. No aortic stenosis is present.  5. The inferior vena cava is normal in size with greater than 50% respiratory variability, suggesting right atrial pressure of 3 mmHg. FINDINGS  Left Ventricle: Left ventricular ejection fraction, by estimation, is 60 to 65%. The left ventricle has normal function. The left ventricle has no regional wall motion abnormalities. Definity contrast agent was given IV to delineate the left ventricular  endocardial borders. The left ventricular internal cavity size was normal in size. There is no left ventricular hypertrophy. Left ventricular diastolic parameters are consistent with Grade I diastolic dysfunction (impaired relaxation). Right Ventricle: The right ventricular size is normal. No increase in right ventricular wall thickness. Right ventricular systolic function is normal. Tricuspid regurgitation signal is inadequate for assessing PA pressure. Left Atrium: Left atrial size was normal in size. Right Atrium: Right atrial size was normal in size. Pericardium: There is no evidence of pericardial effusion. Mitral Valve: The mitral valve is normal in structure. Mild mitral valve regurgitation. No evidence of mitral valve stenosis. Tricuspid Valve: The tricuspid valve is normal in structure. Tricuspid valve regurgitation is not demonstrated. No evidence of tricuspid stenosis. Aortic Valve: The aortic valve is normal in structure. Aortic valve regurgitation is not visualized. No aortic stenosis is present. Pulmonic Valve: The pulmonic valve was normal in structure. Pulmonic valve regurgitation is not visualized. No evidence of pulmonic stenosis. Aorta: The aortic root is  normal in size and structure. Venous: The inferior vena cava is  normal in size with greater than 50% respiratory variability, suggesting right atrial pressure of 3 mmHg. IAS/Shunts: No atrial level shunt detected by color flow Doppler.  LEFT VENTRICLE PLAX 2D LVIDd:         4.40 cm   Diastology LVIDs:         3.00 cm   LV e' medial:    5.72 cm/s LV PW:         1.10 cm   LV E/e' medial:  12.4 LV IVS:        1.00 cm   LV e' lateral:   8.45 cm/s LVOT diam:     1.90 cm   LV E/e' lateral: 8.4 LV SV:         67 LV SV Index:   34 LVOT Area:     2.84 cm  RIGHT VENTRICLE            IVC RV Basal diam:  3.90 cm    IVC diam: 1.90 cm RV S prime:     9.98 cm/s TAPSE (M-mode): 1.7 cm LEFT ATRIUM             Index        RIGHT ATRIUM           Index LA diam:        4.40 cm 2.23 cm/m   RA Area:     15.30 cm LA Vol (A2C):   62.3 ml 31.64 ml/m  RA Volume:   41.80 ml  21.23 ml/m LA Vol (A4C):   42.1 ml 21.38 ml/m LA Biplane Vol: 51.6 ml 26.20 ml/m  AORTIC VALVE LVOT Vmax:   105.00 cm/s LVOT Vmean:  79.000 cm/s LVOT VTI:    0.238 m  AORTA Ao Root diam: 3.00 cm MITRAL VALVE MV Area (PHT): 4.39 cm     SHUNTS MV Decel Time: 173 msec     Systemic VTI:  0.24 m MV E velocity: 70.70 cm/s   Systemic Diam: 1.90 cm MV A velocity: 110.00 cm/s MV E/A ratio:  0.64 Ida Rogue MD Electronically signed by Ida Rogue MD Signature Date/Time: 04/15/2021/1:11:36 PM    Final     Scheduled Meds:  amLODipine  10 mg Oral Daily   carvedilol  25 mg Oral BID   clopidogrel  75 mg Oral Daily   DULoxetine  60 mg Oral Daily   enoxaparin (LOVENOX) injection  30 mg Subcutaneous Q24H   furosemide  40 mg Oral BID   gabapentin  300 mg Oral QHS   hydrALAZINE  25 mg Oral Q8H   insulin aspart  0-20 Units Subcutaneous TID WC   insulin aspart  0-5 Units Subcutaneous QHS   insulin aspart  4 Units Subcutaneous TID WC   insulin glargine-yfgn  10 Units Subcutaneous Daily   pantoprazole  40 mg Oral Daily   pravastatin  80 mg Oral Daily   [START ON  04/20/2021] predniSONE  10 mg Oral Q breakfast   predniSONE  20 mg Oral Q breakfast   Continuous Infusions:  cefTRIAXone (ROCEPHIN)  IV 1 g (04/17/21 0517)     LOS: 3 days   Shelly Coss, MD [facility] P2/25/2023, 8:15 AM

## 2021-04-17 NOTE — TOC Progression Note (Addendum)
Transition of Care [facility]) - Progression Note    Patient Details  Name: Katrina Ortiz MRN: [id number] Date of Birth: [birth year]  Transition of Care [facility]) CM/SW [place], LCSW Phone Number: 04/17/2021, 9:44 AM  Clinical Narrative:   Spoke with patient who confirmed she does want to proceed with SNF. Patient has a bed offer at [facility] which is her top choice. Per MD, patient is medically ready to DC to SNF. Called [employer], spoke with Representative Debbie and started auth for [facility] and [facility].  Reached out to Tammy at [facility] to see if they can take patient over the weekend pending auth, per Tammy they can take patient today if auth and DC Summary are in before 3 pm. If not, it would be Monday due to their Pharmacy hours.   2:10- Call from [place] with [facility] approved for SNF and EMS. Called [facility]my at [facility] to confirm they can still take patient today, she is checking with their DON.   2:27- Per Tammy with [facility] they cannot take patient until Monday.   Expected Discharge Plan:  (SNF vs HH) Barriers to Discharge: Continued Medical Work up  Expected Discharge Plan and Services Expected Discharge Plan:  (SNF vs [facility])     Post Acute Care Choice:  (TBD) Living arrangements for the past 2 months: Single Family Home                                       Social Determinants of Health (SDOH) Interventions    Readmission Risk Interventions Readmission Risk Prevention Plan 04/16/2021  Transportation Screening Complete  PCP or Specialist Appt within 3-5 Days Complete  Social Work Consult for [place] Planning/Counseling Complete  Palliative Care Screening Not Applicable  Medication Review Press photographer) Complete  Some recent data might be hidden

## 2021-04-18 LAB — CBC WITH DIFFERENTIAL/PLATELET
Abs Immature Granulocytes: 0.2 10*3/uL — ABNORMAL HIGH (ref 0.00–0.07)
Basophils Absolute: 0 10*3/uL (ref 0.0–0.1)
Basophils Relative: 0 %
Eosinophils Absolute: 0 10*3/uL (ref 0.0–0.5)
Eosinophils Relative: 0 %
HCT: 27.9 % — ABNORMAL LOW (ref 36.0–46.0)
Hemoglobin: 8.8 g/dL — ABNORMAL LOW (ref 12.0–15.0)
Immature Granulocytes: 2 %
Lymphocytes Relative: 7 %
Lymphs Abs: 0.8 10*3/uL (ref 0.7–4.0)
MCH: 26.8 pg (ref 26.0–34.0)
MCHC: 31.5 g/dL (ref 30.0–36.0)
MCV: 85.1 fL (ref 80.0–100.0)
Monocytes Absolute: 0.9 10*3/uL (ref 0.1–1.0)
Monocytes Relative: 8 %
Neutro Abs: 9.3 10*3/uL — ABNORMAL HIGH (ref 1.7–7.7)
Neutrophils Relative %: 83 %
Platelets: 238 10*3/uL (ref 150–400)
RBC: 3.28 MIL/uL — ABNORMAL LOW (ref 3.87–5.11)
RDW: 14.7 % (ref 11.5–15.5)
WBC: 11.2 10*3/uL — ABNORMAL HIGH (ref 4.0–10.5)
nRBC: 0 % (ref 0.0–0.2)

## 2021-04-18 LAB — GLUCOSE, CAPILLARY
Glucose-Capillary: 235 mg/dL — ABNORMAL HIGH (ref 70–99)
Glucose-Capillary: 266 mg/dL — ABNORMAL HIGH (ref 70–99)
Glucose-Capillary: 296 mg/dL — ABNORMAL HIGH (ref 70–99)
Glucose-Capillary: 139 mg/dL — ABNORMAL HIGH (ref 70–99)
Glucose-Capillary: 151 mg/dL — ABNORMAL HIGH (ref 70–99)

## 2021-04-18 LAB — BASIC METABOLIC PANEL
Anion gap: 8 (ref 5–15)
BUN: 81 mg/dL — ABNORMAL HIGH (ref 8–23)
CO2: 21 mmol/L — ABNORMAL LOW (ref 22–32)
Calcium: 7.8 mg/dL — ABNORMAL LOW (ref 8.9–10.3)
Chloride: 102 mmol/L (ref 98–111)
Creatinine, Ser: 2.63 mg/dL — ABNORMAL HIGH (ref 0.44–1.00)
GFR, Estimated: 19 mL/min — ABNORMAL LOW (ref >60)
Glucose, Bld: 247 mg/dL — ABNORMAL HIGH (ref 70–99)
Potassium: 5.3 mmol/L — ABNORMAL HIGH (ref 3.5–5.1)
Sodium: 131 mmol/L — ABNORMAL LOW (ref 135–145)

## 2021-04-18 MED ORDER — LIDOCAINE HCL (PF) 1 % IJ SOLN
2.0000 mL | Freq: Once | INTRAMUSCULAR | Status: DC
  Filled 2021-04-18: qty 2

## 2021-04-18 MED ORDER — LIDOCAINE HCL (PF) 1 % IJ SOLN
5.0000 mL | Freq: Once | INTRAMUSCULAR | Status: AC
  Administered 2021-04-18: 5 mL
  Filled 2021-04-18 (×2): qty 5

## 2021-04-18 MED ORDER — SODIUM ZIRCONIUM CYCLOSILICATE 5 G PO PACK
5.0000 g | PACK | Freq: Once | ORAL | Status: AC
  Administered 2021-04-18: 5 g via ORAL
  Filled 2021-04-18: qty 1

## 2021-04-18 MED ORDER — DOXYCYCLINE HYCLATE 100 MG PO TABS
100.0000 mg | ORAL_TABLET | Freq: Two times a day (BID) | ORAL | Status: DC
  Administered 2021-04-18 – 2021-04-20 (×5): 100 mg via ORAL
  Filled 2021-04-18 (×5): qty 1

## 2021-04-18 NOTE — Plan of Care (Signed)

## 2021-04-18 NOTE — Progress Notes (Signed)
PROGRESS NOTE  Katrina Ortiz  MQK:[id number] DOB: 1955-08-21 DOA: 04/14/2021 PCP: Lowella Bandy, MD   Brief Narrative: Katrina Ortiz is a 66 y.o. female with medical history significant of Diastolic CHF, CAD with history of CABG, HTN, DM hospitalized from 2/17 to 2/19 with unresponsiveness related to hypoglycemia with blood sugar 28 as well as AKI, and hypoxia, resolving with D10 infusion who presents to the ED with nausea, shortness of breath, generalized weakness and also complains of pain and redness of the left wrist and the right antecubital area.  On presentation she was hypertensive.  Lab work showed leukocytosis, elevated BNP.  UA was suspicious for UTI.  Chest x-ray showed bilateral pleural effusion.  Right upper extremity venous Doppler negative for DVT.  Patient was started on ceftriaxone for the cellulitis of right upper extremity,left wrist .  Cardiology consulted for new onset CHF exacerbation.  PT/OT recommending skilled nursing facility on  discharge.  Patient is medically stable for discharge to skilled nursing facility as soon as bed is available  Assessment & Plan:  Principal Problem:   Acute on chronic diastolic CHF (congestive heart failure) (HCC) Active Problems:   Cellulitis of left wrist   Physical deconditioning   UTI (urinary tract infection)   CKD (chronic kidney disease) stage 4, GFR 15-29 ml/min (HCC)   Type 2 diabetes mellitus (HCC)   Cellulitis of right forearm   Anemia of chronic kidney failure, stage 4 (severe) (HCC)   Hx of CABG   Essential hypertension   Assessment and Plan: * Acute on chronic diastolic CHF (congestive heart failure) (HCC) Elevated BNP.  Started on Lasix 20 mg IV twice daily,now chnaged to lasix 40 mg BID PO Continue home carvedilol.  Daily weights with intake and output monitoring Echocardiogram showed EF of 60 to 65%, grade 1 diastolic dysfunction.  Cardiology was following   Cellulitis of left wrist Started on  ceftriaxone. Unclear etiology.  Patient has history of psoriasis.  X-ray of the wrist did not show any fracture or dislocation but showed a small radiopaque in the soft tissue.  She has cellulitis changes on the right wrist with severe tenderness and edema. Elevated ESR, CRP.Pending  rheumatoid factor, ANA.  Uric acid level is normal.  Started on prednisone for possibility of inflammatory arthritis, now being tapered.  Continue pain management, supportive care.  Leukocytosis is likely secondary to steroids, improving. We also consulted orthopedics .  No plan for further intervention.  Recommended to continue antibiotics.  Antibiotics changed to oral  Physical deconditioning PT/OT recommending skilled nursing  facility on discharge  UTI (urinary tract infection) UA was suspicious for UTI.  Urine culture shows pansensitive E. coli.  On Keflex  CKD (chronic kidney disease) stage 4, GFR 15-29 ml/min (HCC) Creatinine trended up.  Continue to monitor  Essential hypertension Continue Coreg, amlodipine.  Added hydralazine  Hx of CABG Continue aspirin and statins Troponins negative, EKG nonacute and no complaints of chest pain  Anemia of chronic kidney failure, stage 4 (severe) (HCC) Currently hemoglobin stable  Cellulitis of right forearm Possible thrombophlebitis given location and right antecubital fossa, site of location of recent IV injection IV Rocephin Pain control Negative venous ultrasound of right upper extremity for DVT but showed occlusive cephalic vein thrombosis in the right antecubital region   Type 2 diabetes mellitus ([place]) Hyperglycemia likely secondary to steroids.  Continue long-acting and sliding scale              DVT prophylaxis:enoxaparin (LOVENOX) injection 30 mg  Start: 04/17/21 0800     Code Status: Full Code  Family Communication: Called and discussed with the son on phone on 2/25  Patient status:inpatient  Patient is from :home  Anticipated  discharge to:SNF  Estimated DC date:as soon as bed is available   Consultants: cardiology  Procedures:none  Antimicrobials:  Anti-infectives (From admission, onward)    Start     Dose/Rate Route Frequency Ordered Stop   04/18/21 0600  cephALEXin (KEFLEX) capsule 500 mg        500 mg Oral Every 8 hours 04/17/21 0816 04/21/21 0559   04/15/21 0600  cefTRIAXone (ROCEPHIN) 1 g in sodium chloride 0.9 % 100 mL IVPB  Status:  Discontinued        1 g 200 mL/hr over 30 Minutes Intravenous Every 24 hours 04/14/21 0418 04/17/21 0816   04/14/21 0345  cefTRIAXone (ROCEPHIN) 1 g in sodium chloride 0.9 % 100 mL IVPB        1 g 200 mL/hr over 30 Minutes Intravenous  Once 04/14/21 6237 04/14/21 0445       Subjective:  Patient seen and examined at the bedside this morning.  Hemodynamically stable.  Left forearm cellulitis looks better but she still has some discomfort.  There is still significant tenderness on the left wrist.  No other new complaints  Objective: Vitals:   04/17/21 1940 04/17/21 2346 04/18/21 0513 04/18/21 0813  BP: (!) 141/54 (!) 123/49 (!) 141/50 (!) 150/53  Pulse: 74 72 72 72  Resp: $Remo'16 16 18 18  'dQFqc$ Temp: 97.7 F (36.5 C) 97.8 F (36.6 C) 98.3 F (36.8 C) 98.1 F (36.7 C)  TempSrc:      SpO2: 99% 98% 98% 100%  Weight:   96.7 kg   Height:        Intake/Output Summary (Last 24 hours) at 04/18/2021 1118 Last data filed at 04/18/2021 1015 Gross per 24 hour  Intake 380 ml  Output 1300 ml  Net -920 ml   Filed Weights   04/16/21 0416 04/17/21 0508 04/18/21 0513  Weight: 97.4 kg 95.8 kg 96.7 kg    Examination:   General exam: Overall comfortable, not in distress,obese HEENT: PERRL Respiratory system:  no wheezes or crackles  Cardiovascular system: S1 & S2 heard, RRR.  Gastrointestinal system: Abdomen is nondistended, soft and nontender. Central nervous system: Alert and oriented Extremities: No edema, no clubbing ,no cyanosis, significantly improved cellulitis on  the left wrist but still tender Skin: No rashes, no ulcers,no icterus    Data Reviewed: I have personally reviewed following labs and imaging studies  CBC: Recent Labs  Lab 04/13/21 2354 04/15/21 0608 04/16/21 0632 04/17/21 0332 04/18/21 0642  WBC 11.3* 15.4* 15.2* 14.0* 11.2*  NEUTROABS  --  14.4* 13.3* 12.7* 9.3*  HGB 9.8* 9.8* 8.3* 8.7* 8.8*  HCT 30.4* 31.1* 25.5* 27.7* 27.9*  MCV 84.2 85.4 84.4 85.2 85.1  PLT 161 144* 170 208 628   Basic Metabolic Panel: Recent Labs  Lab 04/13/21 2354 04/14/21 0240 04/15/21 0608 04/16/21 0632 04/17/21 0332 04/18/21 0642  NA 133*  --  134* 130* 132* 131*  K 4.6  --  4.8 4.6 4.9 5.3*  CL 103  --  104 102 101 102  CO2 20*  --  17* 18* 20* 21*  GLUCOSE 217*  --  245* 315* 138* 247*  BUN 36*  --  40* 59* 74* 81*  CREATININE 2.32* 2.29* 2.38* 2.62* 2.49* 2.63*  CALCIUM 8.4*  --  8.2* 7.6* 8.0* 7.8*  Recent Results (from the past 240 hour(s))  Urine Culture     Status: Abnormal   Collection Time: 04/12/21  7:35 PM   Specimen: Urine, Clean Catch  Result Value Ref Range Status   Specimen Description   Final    URINE, CLEAN CATCH Performed at [facility] [address] [place], [place] [address]    Special Requests   Final    NONE Performed at [facility], [place], [place] [address]    Culture >=100,000 COLONIES/mL ESCHERICHIA COLI (A)  Final   Report Status 04/16/2021 FINAL  Final   Organism ID, Bacteria ESCHERICHIA COLI (A)  Final      Susceptibility   Escherichia coli - MIC*    AMPICILLIN <=2 SENSITIVE Sensitive     CEFAZOLIN <=4 SENSITIVE Sensitive     CEFEPIME <=0.12 SENSITIVE Sensitive     CEFTRIAXONE <=0.25 SENSITIVE Sensitive     CIPROFLOXACIN <=0.25 SENSITIVE Sensitive     GENTAMICIN <=1 SENSITIVE Sensitive     IMIPENEM <=0.25 SENSITIVE Sensitive     NITROFURANTOIN <=16 SENSITIVE Sensitive     TRIMETH/SULFA <=20 SENSITIVE Sensitive     AMPICILLIN/SULBACTAM <=2 SENSITIVE  Sensitive     PIP/TAZO <=4 SENSITIVE Sensitive     * >=100,000 COLONIES/mL ESCHERICHIA COLI  Resp Panel by RT-PCR (Flu A&B, Covid) Nasopharyngeal Swab     Status: None   Collection Time: 04/14/21  2:40 AM   Specimen: Nasopharyngeal Swab; Nasopharyngeal(NP) swabs in vial transport medium  Result Value Ref Range Status   SARS Coronavirus 2 by RT PCR NEGATIVE NEGATIVE Final    Comment: (NOTE) SARS-CoV-2 target nucleic acids are NOT DETECTED.  The SARS-CoV-2 RNA is generally detectable in upper respiratory specimens during the acute phase of infection. The lowest concentration of SARS-CoV-2 viral copies this assay can detect is 138 copies/mL. A negative result does not preclude SARS-Cov-2 infection and should not be used as the sole basis for treatment or other patient management decisions. A negative result may occur with  improper specimen collection/handling, submission of specimen other than nasopharyngeal swab, presence of viral mutation(s) within the areas targeted by this assay, and inadequate number of viral copies(<138 copies/mL). A negative result must be combined with clinical observations, patient history, and epidemiological information. The expected result is Negative.  Fact Sheet for Patients:  EntrepreneurPulse.com.au  Fact Sheet for Healthcare Providers:  IncredibleEmployment.be  This test is no t yet approved or cleared by the [place] FDA and  has been authorized for detection and/or diagnosis of SARS-CoV-2 by FDA under an Emergency Use Authorization (EUA). This EUA will remain  in effect (meaning this test can be used) for the duration of the COVID-19 declaration under Section 564(b)(1) of the Act, 21 U.S.C.section 360bbb-3(b)(1), unless the authorization is terminated  or revoked sooner.       Influenza A by PCR NEGATIVE NEGATIVE Final   Influenza B by PCR NEGATIVE NEGATIVE Final    Comment: (NOTE) The Xpert Xpress  SARS-CoV-2/FLU/RSV plus assay is intended as an aid in the diagnosis of influenza from Nasopharyngeal swab specimens and should not be used as a sole basis for treatment. Nasal washings and aspirates are unacceptable for Xpert Xpress SARS-CoV-2/FLU/RSV testing.  Fact Sheet for Patients: EntrepreneurPulse.com.au  Fact Sheet for Healthcare Providers: IncredibleEmployment.be  This test is not yet approved or cleared by the [place] FDA and has been authorized for detection and/or diagnosis of SARS-CoV-2 by FDA under an Emergency Use Authorization (EUA). This EUA  will remain in effect (meaning this test can be used) for the duration of the COVID-19 declaration under Section 564(b)(1) of the Act, 21 U.S.C. section 360bbb-3(b)(1), unless the authorization is terminated or revoked.  Performed at [facility], [address] [place], [place] [address]      Radiology Studies: No results found.  Scheduled Meds:  amLODipine  10 mg Oral Daily   carvedilol  25 mg Oral BID   cephALEXin  500 mg Oral Q8H   clopidogrel  75 mg Oral Daily   DULoxetine  60 mg Oral Daily   enoxaparin (LOVENOX) injection  30 mg Subcutaneous Q24H   furosemide  40 mg Oral BID   gabapentin  300 mg Oral QHS   hydrALAZINE  25 mg Oral Q8H   insulin aspart  0-20 Units Subcutaneous TID WC   insulin aspart  0-5 Units Subcutaneous QHS   insulin aspart  4 Units Subcutaneous TID WC   insulin glargine-yfgn  10 Units Subcutaneous Daily   pantoprazole  40 mg Oral Daily   pravastatin  80 mg Oral Daily   [START ON 04/20/2021] predniSONE  10 mg Oral Q breakfast   predniSONE  20 mg Oral Q breakfast   sodium zirconium cyclosilicate  5 g Oral Once   Continuous Infusions:     LOS: 4 days   Shelly Coss, MD [facility] P2/26/2023, 11:18 AM

## 2021-04-18 NOTE — Progress Notes (Signed)
Patient ID: Katrina Ortiz, female   DOB: 26-Dec-1955, 66 y.o.   MRN: [id number]  Subjective: The patient feels that her wrist symptoms are worse this morning.  She has increased pain in the wrist both the palpation and with range of motion.  She denies any injury to the wrist, and denies any numbness or paresthesias to her fingers.   Objective: Vital signs in last 24 hours: Temp:  [97.7 F (36.5 C)-98.3 F (36.8 C)] 98.1 F (36.7 C) (02/26 0813) Pulse Rate:  [72-75] 72 (02/26 0813) Resp:  [16-18] 18 (02/26 0813) BP: (123-150)/(49-60) 150/53 (02/26 0813) SpO2:  [98 %-100 %] 100 % (02/26 0813) Weight:  [96.7 kg] 96.7 kg (02/26 0513)  Intake/Output from previous day: 02/25 0701 - 02/26 0700 In: 380 [P.O.:380] Out: 1301 [Urine:1301] Intake/Output this shift: Total I/O In: 240 [P.O.:240] Out: -   Recent Labs    04/16/21 0632 04/17/21 0332 04/18/21 0642  HGB 8.3* 8.7* 8.8*   Recent Labs    04/17/21 0332 04/18/21 0642  WBC 14.0* 11.2*  RBC 3.25* 3.28*  HCT 27.7* 27.9*  PLT 208 238   Recent Labs    04/17/21 0332 04/18/21 0642  NA 132* 131*  K 4.9 5.3*  CL 101 102  CO2 20* 21*  BUN 74* 81*  CREATININE 2.49* 2.63*  GLUCOSE 138* 247*  CALCIUM 8.0* 7.8*   No results for input(s): LABPT, INR in the last 72 hours.  Physical Exam: Orthopedic examination again is limited to the left wrist and hand.  There is slightly increased swelling in the wrist area as compared to yesterday.  There may also be a trace effusion.  She has moderate tenderness to palpation over the dorsal and radial aspects of the wrist.  She has increased pain with wrist range of motion, tolerating approximate 10 degrees of extension to 15 degrees of flexion.  She is neurovascularly intact to the left hand.  Assessment: Dorsal left wrist cellulitis.  Plan: The treatment options have been discussed with the patient.  Although her white count continues to decrease and she remains afebrile, given her  worsening symptoms today as compared to yesterday, I have explained to her that I think it would be best to perform a wrist aspiration as a precaution to rule out left wrist sepsis.  After obtaining verbal consent, the dorsal aspect of the left wrist is injected sterilely with 1 cc of 1% lidocaine for anesthesia before an attempt was made to aspirate the wrist joint itself.  Although the patient tolerated the procedure well, no fluid could be aspirated from the wrist, even with a large bore needle and a small syringe.   It is possible that her increased symptoms may be due to the fact that she has been switched from IV to oral antibiotics.  I have taken the liberty of switching her from Keflex to doxycycline as this may be more beneficial in treating a MRSA infection if present.  In addition, I will keep her n.p.o. after midnight tonight just in case her symptoms continue to worsen.  I will reevaluate her in the morning.  If her wrist pain does not show signs of improvement, I will consider taking her to the operating room tomorrow for formal irrigation and debridement of the left wrist.   Marshall Cork Jocelyn Nold 04/18/2021, 11:21 AM

## 2021-04-19 DIAGNOSIS — E875 Hyperkalemia: Secondary | ICD-10-CM

## 2021-04-19 LAB — BASIC METABOLIC PANEL
Anion gap: 12 (ref 5–15)
BUN: 90 mg/dL — ABNORMAL HIGH (ref 8–23)
CO2: 21 mmol/L — ABNORMAL LOW (ref 22–32)
Calcium: 8.2 mg/dL — ABNORMAL LOW (ref 8.9–10.3)
Chloride: 101 mmol/L (ref 98–111)
Creatinine, Ser: 2.41 mg/dL — ABNORMAL HIGH (ref 0.44–1.00)
GFR, Estimated: 22 mL/min — ABNORMAL LOW (ref >60)
Glucose, Bld: 249 mg/dL — ABNORMAL HIGH (ref 70–99)
Potassium: 6.4 mmol/L (ref 3.5–5.1)
Sodium: 134 mmol/L — ABNORMAL LOW (ref 135–145)

## 2021-04-19 LAB — CBC WITH DIFFERENTIAL/PLATELET
Abs Immature Granulocytes: 0.24 10*3/uL — ABNORMAL HIGH (ref 0.00–0.07)
Basophils Absolute: 0 10*3/uL (ref 0.0–0.1)
Basophils Relative: 0 %
Eosinophils Absolute: 0 10*3/uL (ref 0.0–0.5)
Eosinophils Relative: 0 %
HCT: 29.9 % — ABNORMAL LOW (ref 36.0–46.0)
Hemoglobin: 9.3 g/dL — ABNORMAL LOW (ref 12.0–15.0)
Immature Granulocytes: 2 %
Lymphocytes Relative: 3 %
Lymphs Abs: 0.4 10*3/uL — ABNORMAL LOW (ref 0.7–4.0)
MCH: 26.7 pg (ref 26.0–34.0)
MCHC: 31.1 g/dL (ref 30.0–36.0)
MCV: 85.9 fL (ref 80.0–100.0)
Monocytes Absolute: 0.5 10*3/uL (ref 0.1–1.0)
Monocytes Relative: 4 %
Neutro Abs: 10.8 10*3/uL — ABNORMAL HIGH (ref 1.7–7.7)
Neutrophils Relative %: 91 %
Platelets: 251 10*3/uL (ref 150–400)
RBC: 3.48 MIL/uL — ABNORMAL LOW (ref 3.87–5.11)
RDW: 14.6 % (ref 11.5–15.5)
WBC: 11.9 10*3/uL — ABNORMAL HIGH (ref 4.0–10.5)
nRBC: 0 % (ref 0.0–0.2)

## 2021-04-19 LAB — GLUCOSE, CAPILLARY
Glucose-Capillary: 275 mg/dL — ABNORMAL HIGH (ref 70–99)
Glucose-Capillary: 353 mg/dL — ABNORMAL HIGH (ref 70–99)
Glucose-Capillary: 321 mg/dL — ABNORMAL HIGH (ref 70–99)
Glucose-Capillary: 322 mg/dL — ABNORMAL HIGH (ref 70–99)
Glucose-Capillary: 307 mg/dL — ABNORMAL HIGH (ref 70–99)

## 2021-04-19 LAB — POTASSIUM: Potassium: 5.6 mmol/L — ABNORMAL HIGH (ref 3.5–5.1)

## 2021-04-19 MED ORDER — SODIUM ZIRCONIUM CYCLOSILICATE 10 G PO PACK
10.0000 g | PACK | Freq: Once | ORAL | Status: AC
  Administered 2021-04-19: 10 g via ORAL
  Filled 2021-04-19: qty 1

## 2021-04-19 MED ORDER — CALCIUM GLUCONATE-NACL 2-0.675 GM/100ML-% IV SOLN
2.0000 g | Freq: Once | INTRAVENOUS | Status: AC
Start: 2021-04-19 — End: 2021-04-19
  Administered 2021-04-19: 2000 mg via INTRAVENOUS
  Filled 2021-04-19: qty 100

## 2021-04-19 MED ORDER — SENNA 8.6 MG PO TABS
1.0000 | ORAL_TABLET | Freq: Two times a day (BID) | ORAL | Status: DC
  Administered 2021-04-19 – 2021-04-20 (×2): 8.6 mg via ORAL
  Filled 2021-04-19 (×2): qty 1

## 2021-04-19 MED ORDER — POLYETHYLENE GLYCOL 3350 17 G PO PACK
17.0000 g | PACK | Freq: Every day | ORAL | Status: DC
  Administered 2021-04-19 – 2021-04-20 (×2): 17 g via ORAL
  Filled 2021-04-19 (×2): qty 1

## 2021-04-19 MED ORDER — INSULIN ASPART 100 UNIT/ML IJ SOLN
10.0000 [IU] | Freq: Once | INTRAMUSCULAR | Status: AC
  Administered 2021-04-19: 10 [IU] via SUBCUTANEOUS
  Filled 2021-04-19: qty 1

## 2021-04-19 MED ORDER — INSULIN ASPART 100 UNIT/ML IJ SOLN
6.0000 [IU] | Freq: Three times a day (TID) | INTRAMUSCULAR | Status: DC
  Administered 2021-04-19 – 2021-04-20 (×4): 6 [IU] via SUBCUTANEOUS

## 2021-04-19 MED ORDER — DEXTROSE 50 % IV SOLN
25.0000 g | Freq: Once | INTRAVENOUS | Status: AC
  Administered 2021-04-19: 25 g via INTRAVENOUS
  Filled 2021-04-19: qty 50

## 2021-04-19 MED ORDER — INSULIN GLARGINE-YFGN 100 UNIT/ML ~~LOC~~ SOLN
12.0000 [IU] | Freq: Every day | SUBCUTANEOUS | Status: DC
  Administered 2021-04-20: 12 [IU] via SUBCUTANEOUS
  Filled 2021-04-19 (×2): qty 0.12

## 2021-04-19 MED ORDER — BISACODYL 10 MG RE SUPP
10.0000 mg | Freq: Once | RECTAL | Status: DC
  Administered 2021-04-19: 10 mg via RECTAL
  Filled 2021-04-19: qty 1

## 2021-04-19 NOTE — Progress Notes (Signed)
° °      CROSS COVER NOTE  NAME: Katrina Ortiz MRN: [id number] DOB : 07-22-1955   Critical lab result K 6.4. Treated with Calcium, Insulin, D50. EKG and q4H potassium ordered.  Neomia Glass MHA, MSN, FNP-BC Nurse Practitioner [facility] Pager [phone]

## 2021-04-19 NOTE — Progress Notes (Signed)
Occupational Therapy Treatment Patient Details Name: Katrina Ortiz MRN: [id number] DOB: 09-25-1955 Today's Date: 04/19/2021   History of present illness Chaslyn Eisen is a 66 y.o. female with medical history significant of Diastolic CHF, CAD with history of CABG, HTN, DM hospitalized from 2/17 to 2/19 with unresponsiveness related to hypoglycemia, AKI, and hypoxia. Pt presents to the ED with nausea, shortness of breath, generalized weakness and also complains of pain and redness of the left wrist and the right antecubital area. Negative venous ultrasound of right upper extremity for DVT but showed occlusive cephalic vein thrombosis in the right antecubital region   OT comments  Katrina Ortiz was seen for OT treatment on this date. Upon arrival to room pt reclined in bed, agreeable to tx. Pt reports completing LUE HEP, reviewed with good recall. Pt requires MIN A don/doff briefs. MIN A for BSC t/f and SUPERVISION pericare in sitting. SETUP don/doff socks in sitting. SETUP self-feeding sitting in chair. Pt making good progress toward goals. Pt continues to benefit from skilled OT services to maximize return to PLOF and minimize risk of future falls, injury, caregiver burden, and readmission. Will continue to follow POC. Discharge recommendation remains appropriate.     Recommendations for follow up therapy are one component of a multi-disciplinary discharge planning process, led by the attending physician.  Recommendations may be updated based on patient status, additional functional criteria and insurance authorization.    Follow Up Recommendations  Skilled nursing-short term rehab (<3 hours/day)    Assistance Recommended at Discharge Intermittent Supervision/Assistance  Patient can return home with the following  A little help with walking and/or transfers;A little help with bathing/dressing/bathroom;Assistance with cooking/housework;Help with stairs or ramp for entrance   Equipment  Recommendations  BSC/3in1    Recommendations for Other Services      Precautions / Restrictions Precautions Precautions: Fall Restrictions Weight Bearing Restrictions: No       Mobility Bed Mobility Overal bed mobility: Needs Assistance Bed Mobility: Supine to Sit     Supine to sit: Min guard          Transfers Overall transfer level: Needs assistance Equipment used: Rolling walker (2 wheels) Transfers: Sit to/from Stand Sit to Stand: Min assist                 Balance Overall balance assessment: Needs assistance Sitting-balance support: Feet supported Sitting balance-Leahy Scale: Good     Standing balance support: No upper extremity supported, During functional activity Standing balance-Leahy Scale: Fair                             ADL either performed or assessed with clinical judgement   ADL Overall ADL's : Needs assistance/impaired                                       General ADL Comments: MIN A don/doff briefs. MIN A for BSC t/f and SUPERVISION pericare in sitting. SETUP don/doff socks in sitting. SETUP self-feeding sitting in chair      Cognition Arousal/Alertness: Awake/alert Behavior During Therapy: WFL for tasks assessed/performed Overall Cognitive Status: Within Functional Limits for tasks assessed  General Comments reviewed LUE HEP    Pertinent Vitals/ Pain       Pain Assessment Pain Assessment: 0-10 Pain Score: 6  Pain Location: left wrist Pain Descriptors / Indicators: Discomfort Pain Intervention(s): Limited activity within patient's tolerance   Frequency  Min 2X/week        Progress Toward Goals  OT Goals(current goals can now be found in the care plan section)  Progress towards OT goals: Progressing toward goals  Acute Rehab OT Goals Patient Stated Goal: to get better OT Goal Formulation: With patient Time For Goal  Achievement: 04/29/21 Potential to Achieve Goals: Good ADL Goals Pt Will Perform Grooming: with modified independence;standing Pt Will Perform Lower Body Dressing: with modified independence;sit to/from stand Pt Will Transfer to Toilet: with modified independence;ambulating;regular height toilet  Plan Discharge plan remains appropriate;Frequency remains appropriate    Co-evaluation                 AM-PAC OT "6 Clicks" Daily Activity     Outcome Measure   Help from another person eating meals?: A Little Help from another person taking care of personal grooming?: A Little Help from another person toileting, which includes using toliet, bedpan, or urinal?: A Little Help from another person bathing (including washing, rinsing, drying)?: A Lot Help from another person to put on and taking off regular upper body clothing?: A Little Help from another person to put on and taking off regular lower body clothing?: A Little 6 Click Score: 17    End of Session Equipment Utilized During Treatment: Rolling walker (2 wheels)  OT Visit Diagnosis: Other abnormalities of gait and mobility (R26.89);Muscle weakness (generalized) (M62.81)   Activity Tolerance Patient tolerated treatment well   Patient Left in chair;with call bell/phone within reach;with chair alarm set   Nurse Communication          Time: 1341-1406 OT Time Calculation (min): 25 min  Charges: OT General Charges $OT Visit: 1 Visit OT Treatments $Self Care/Home Management : 23-37 mins  Dessie Coma, M.S. OTR/L  04/19/21, 4:10 PM  ascom [phone]

## 2021-04-19 NOTE — Progress Notes (Signed)
Patient ID: Katrina Ortiz, female   DOB: [birth year], 66 y.o.   MRN: [id number]  Subjective: The patient notes improvement in her wrist symptoms as compared to yesterday.  She feels that she is able to move her fingers more freely and to move her wrist better as well. She has no new complaints pertaining to her left wrist or hand.  Objective: Vital signs in last 24 hours: Temp:  [97.6 F (36.4 C)-98.2 F (36.8 C)] 98.2 F (36.8 C) (02/27 0743) Pulse Rate:  [64-80] 80 (02/27 0743) Resp:  [16-20] 16 (02/27 0658) BP: (115-152)/(37-72) 151/72 (02/27 0743) SpO2:  [93 %-100 %] 93 % (02/27 0743) Weight:  [96.3 kg] 96.3 kg (02/27 0004)  Intake/Output from previous day: 02/26 0701 - 02/27 0700 In: 380 [P.O.:380] Out: 450 [Urine:450] Intake/Output this shift: No intake/output data recorded.  Recent Labs    04/17/21 0332 04/18/21 0642 04/19/21 0342  HGB 8.7* 8.8* 9.3*   Recent Labs    04/18/21 0642 04/19/21 0342  WBC 11.2* 11.9*  RBC 3.28* 3.48*  HCT 27.9* 29.9*  PLT 238 251   Recent Labs    04/18/21 0642 04/19/21 0342  NA 131* 134*  K 5.3* 6.4*  CL 102 101  CO2 21* 21*  BUN 81* 90*  CREATININE 2.63* 2.41*  GLUCOSE 247* 138*  CALCIUM 7.8* 8.2*   No results for input(s): LABPT, INR in the last 72 hours.  Physical Exam: Orthopedic examination is limited to the left hand and wrist.  On skin inspection, the mildly increased swelling and erythema noted yesterday appears to be improved.  There is much better "wrinkling" of the skin this morning, and she has only mild tenderness to palpation over the dorsal aspect of the wrist.  She is able to tolerate 15 to 20 degrees of wrist extension and 20 degrees of wrist flexion with mild reproduction of her pain at the extremes of both flexion and extension.  She is able to actively flex and extend all digits without any pain or triggering, and feels that she is able to flex her fingers better today.  She is neurovascularly intact to all  digits.  Assessment: Dorsal left wrist cellulitis, somewhat improved as compared to yesterday.  Plan: The treatment options have been reviewed with the patient.  Given the patient's symptomatic improvement today, I feel we can hold off on surgical intervention as hopefully the change in oral antibiotics to doxycycline is helping.  I will defer surgical intervention today and allow her to resume eating.  We will continue the oral antibiotics and recheck her in the morning.  Again, as a precaution, I will keep her n.p.o. after midnight in case her symptoms and examination again worsen.   Marshall Cork Decorey Wahlert 04/19/2021, 7:50 AM

## 2021-04-19 NOTE — Progress Notes (Signed)
Lab called with critical lab result K 6.4, on-call notified, new order. EGK done, D50 given. Please see MAR. We continue to monitor.

## 2021-04-19 NOTE — Plan of Care (Signed)

## 2021-04-19 NOTE — Progress Notes (Addendum)
PROGRESS NOTE  Katrina Ortiz  YSA:[id number] DOB: [birth year] DOA: 04/14/2021 PCP: Lowella Bandy, MD   Brief Narrative: Katrina Ortiz is a 66 y.o. female with medical history significant of Diastolic CHF, CAD with history of CABG, HTN, DM hospitalized from 2/17 to 2/19 with unresponsiveness related to hypoglycemia with blood sugar 28 as well as AKI, and hypoxia, resolving with D10 infusion who presents to the ED with nausea, shortness of breath, generalized weakness and also complains of pain and redness of the left wrist and the right antecubital area.  On presentation she was hypertensive.  Lab work showed leukocytosis, elevated BNP.  UA was suspicious for UTI.  Chest x-ray showed bilateral pleural effusion.  Right upper extremity venous Doppler negative for DVT.  Patient was started on ceftriaxone for the cellulitis of right upper extremity,left wrist .  Cardiology consulted for new onset CHF exacerbation.  PT/OT recommending skilled nursing facility on  discharge.  Orthopedics closely following for left wrist cellulitis.  Assessment & Plan:  Principal Problem:   Acute on chronic diastolic CHF (congestive heart failure) (HCC) Active Problems:   Cellulitis of left wrist   Physical deconditioning   Hyperkalemia   UTI (urinary tract infection)   CKD (chronic kidney disease) stage 4, GFR 15-29 ml/min (HCC)   Type 2 diabetes mellitus (HCC)   Cellulitis of right forearm   Anemia of chronic kidney failure, stage 4 (severe) (HCC)   Hx of CABG   Essential hypertension   Assessment and Plan: * Acute on chronic diastolic CHF (congestive heart failure) (HCC) Elevated BNP.  Started on Lasix 20 mg IV twice daily,now chnaged to lasix 40 mg BID PO Continue home carvedilol.  Daily weights with intake and output monitoring Echocardiogram showed EF of 60 to 32%, grade 1 diastolic dysfunction.  Cardiology was following   Cellulitis of left wrist Started on ceftriaxone. Unclear etiology.   Patient has history of psoriasis.  X-ray of the wrist did not show any fracture or dislocation but showed a small radiopaque in the soft tissue.  She has cellulitis changes on the right wrist with severe tenderness and edema. Elevated ESR, CRP.ANA negative but rheumatoid factor came out to be positive.  Uric acid level is normal.  Started on prednisone for possibility of inflammatory arthritis, now being tapered.  Continue pain management, supportive care.  Leukocytosis is likely secondary to steroids, improving.  Inflammatory arthritis is a possibility.  She needs to follow-up with rheumatology as an outpatient We also consulted orthopedics . Recommended to continue antibiotics.  Antibiotics changed to doxycycline.  Orthopedics closely monitoring for need of I&D if no further improvement in the wrist pain.  Hyperkalemia Treated with Lokelma, insulin, calcium gluconate.  Repeat potassium level later today   Physical deconditioning PT/OT recommending skilled nursing  facility on discharge  UTI (urinary tract infection) UA was suspicious for UTI.  Urine culture shows pansensitive E. coli.  Was treated with cephalosporin.  CKD (chronic kidney disease) stage 4, GFR 15-29 ml/min (HCC) Currently kidney function at baseline  Essential hypertension Continue Coreg, amlodipine.  Added hydralazine  Hx of CABG Continue aspirin and statins Troponins negative, EKG nonacute and no complaints of chest pain  Anemia of chronic kidney failure, stage 4 (severe) (HCC) Currently hemoglobin stable  Cellulitis of right forearm Possible thrombophlebitis given location and right antecubital fossa, site of location of recent IV injection IV Rocephin Pain control Negative venous ultrasound of right upper extremity for DVT but showed occlusive cephalic vein thrombosis in the right  antecubital region   Type 2 diabetes mellitus ([place]) Hyperglycemia likely secondary to steroids.  Continue long-acting and sliding  scale           DVT prophylaxis:enoxaparin (LOVENOX) injection 30 mg Start: 04/17/21 0800     Code Status: Full Code  Family Communication: Called and discussed with the son on phone on 2/25  Patient status:inpatient  Patient is from :home  Anticipated discharge to:SNF  Estimated DC date: 1 to 2 days   Consultants: cardiology  Procedures:none  Antimicrobials:  Anti-infectives (From admission, onward)    Start     Dose/Rate Route Frequency Ordered Stop   04/18/21 1245  doxycycline (VIBRA-TABS) tablet 100 mg        100 mg Oral Every 12 hours 04/18/21 1158     04/18/21 0600  cephALEXin (KEFLEX) capsule 500 mg  Status:  Discontinued        500 mg Oral Every 8 hours 04/17/21 0816 04/18/21 1158   04/15/21 0600  cefTRIAXone (ROCEPHIN) 1 g in sodium chloride 0.9 % 100 mL IVPB  Status:  Discontinued        1 g 200 mL/hr over 30 Minutes Intravenous Every 24 hours 04/14/21 0418 04/17/21 0816   04/14/21 0345  cefTRIAXone (ROCEPHIN) 1 g in sodium chloride 0.9 % 100 mL IVPB        1 g 200 mL/hr over 30 Minutes Intravenous  Once 04/14/21 6283 04/14/21 0445       Subjective:  Patient seen and examined at the bedside this morning.  Hemodynamically stable.  Eating her breakfast.  She looked comfortable today.  Her wrist pain is slightly better today but not entirely gone  Objective: Vitals:   04/19/21 0658 04/19/21 0743 04/19/21 0850 04/19/21 0945  BP: (!) 152/62 (!) 151/72 (!) 148/69 (!) 137/51  Pulse: 76 80 82 78  Resp: 16     Temp: 97.8 F (36.6 C) 98.2 F (36.8 C) 98.2 F (36.8 C) 98.1 F (36.7 C)  TempSrc:  Oral Oral Oral  SpO2: 95% 93% 95% 98%  Weight:      Height:        Intake/Output Summary (Last 24 hours) at 04/19/2021 1107 Last data filed at 04/19/2021 1030 Gross per 24 hour  Intake 448.63 ml  Output 850 ml  Net -401.37 ml   Filed Weights   04/17/21 0508 04/18/21 0513 04/19/21 0004  Weight: 95.8 kg 96.7 kg 96.3 kg    Examination:   General  exam: Overall comfortable, not in distress,obese HEENT: PERRL Respiratory system:  no wheezes or crackles  Cardiovascular system: S1 & S2 heard, RRR.  Gastrointestinal system: Abdomen is nondistended, soft and nontender. Central nervous system: Alert and oriented Extremities: No edema, no clubbing ,no cyanosis, tenderness on left wrist Skin: No rashes, no ulcers,no icterus    Data Reviewed: I have personally reviewed following labs and imaging studies  CBC: Recent Labs  Lab 04/15/21 0608 04/16/21 0632 04/17/21 0332 04/18/21 0642 04/19/21 0342  WBC 15.4* 15.2* 14.0* 11.2* 11.9*  NEUTROABS 14.4* 13.3* 12.7* 9.3* 10.8*  HGB 9.8* 8.3* 8.7* 8.8* 9.3*  HCT 31.1* 25.5* 27.7* 27.9* 29.9*  MCV 85.4 84.4 85.2 85.1 85.9  PLT 144* 170 208 238 662   Basic Metabolic Panel: Recent Labs  Lab 04/15/21 0608 04/16/21 0632 04/17/21 0332 04/18/21 0642 04/19/21 0342  NA 134* 130* 132* 131* 134*  K 4.8 4.6 4.9 5.3* 6.4*  CL 104 102 101 102 101  CO2 17* 18* 20* 21* 21*  GLUCOSE 245* 315* 138* 247* 138*  BUN 40* 59* 74* 81* 90*  CREATININE 2.38* 2.62* 2.49* 2.63* 2.41*  CALCIUM 8.2* 7.6* 8.0* 7.8* 8.2*     Recent Results (from the past 240 hour(s))  Urine Culture     Status: Abnormal   Collection Time: 04/12/21  7:35 PM   Specimen: Urine, Clean Catch  Result Value Ref Range Status   Specimen Description   Final    URINE, CLEAN CATCH Performed at [facility], [address], [place], [place] [address]    Special Requests   Final    NONE Performed at [facility], [address] [place], [place] [address]    Culture >=100,000 COLONIES/mL ESCHERICHIA COLI (A)  Final   Report Status 04/16/2021 FINAL  Final   Organism ID, Bacteria ESCHERICHIA COLI (A)  Final      Susceptibility   Escherichia coli - MIC*    AMPICILLIN <=2 SENSITIVE Sensitive     CEFAZOLIN <=4 SENSITIVE Sensitive     CEFEPIME <=0.12 SENSITIVE Sensitive     CEFTRIAXONE <=0.25 SENSITIVE Sensitive      CIPROFLOXACIN <=0.25 SENSITIVE Sensitive     GENTAMICIN <=1 SENSITIVE Sensitive     IMIPENEM <=0.25 SENSITIVE Sensitive     NITROFURANTOIN <=16 SENSITIVE Sensitive     TRIMETH/SULFA <=20 SENSITIVE Sensitive     AMPICILLIN/SULBACTAM <=2 SENSITIVE Sensitive     PIP/TAZO <=4 SENSITIVE Sensitive     * >=100,000 COLONIES/mL ESCHERICHIA COLI  Resp Panel by RT-PCR (Flu A&B, Covid) Nasopharyngeal Swab     Status: None   Collection Time: 04/14/21  2:40 AM   Specimen: Nasopharyngeal Swab; Nasopharyngeal(NP) swabs in vial transport medium  Result Value Ref Range Status   SARS Coronavirus 2 by RT PCR NEGATIVE NEGATIVE Final    Comment: (NOTE) SARS-CoV-2 target nucleic acids are NOT DETECTED.  The SARS-CoV-2 RNA is generally detectable in upper respiratory specimens during the acute phase of infection. The lowest concentration of SARS-CoV-2 viral copies this assay can detect is 138 copies/mL. A negative result does not preclude SARS-Cov-2 infection and should not be used as the sole basis for treatment or other patient management decisions. A negative result may occur with  improper specimen collection/handling, submission of specimen other than nasopharyngeal swab, presence of viral mutation(s) within the areas targeted by this assay, and inadequate number of viral copies(<138 copies/mL). A negative result must be combined with clinical observations, patient history, and epidemiological information. The expected result is Negative.  Fact Sheet for Patients:  EntrepreneurPulse.com.au  Fact Sheet for Healthcare Providers:  IncredibleEmployment.be  This test is no t yet approved or cleared by the [place] FDA and  has been authorized for detection and/or diagnosis of SARS-CoV-2 by FDA under an Emergency Use Authorization (EUA). This EUA will remain  in effect (meaning this test can be used) for the duration of the COVID-19 declaration under Section  564(b)(1) of the Act, 21 U.S.C.section 360bbb-3(b)(1), unless the authorization is terminated  or revoked sooner.       Influenza A by PCR NEGATIVE NEGATIVE Final   Influenza B by PCR NEGATIVE NEGATIVE Final    Comment: (NOTE) The Xpert Xpress SARS-CoV-2/FLU/RSV plus assay is intended as an aid in the diagnosis of influenza from Nasopharyngeal swab specimens and should not be used as a sole basis for treatment. Nasal washings and aspirates are unacceptable for Xpert Xpress SARS-CoV-2/FLU/RSV testing.  Fact Sheet for Patients: EntrepreneurPulse.com.au  Fact Sheet for Healthcare Providers: IncredibleEmployment.be  This test  is not yet approved or cleared by the [place] and has been authorized for detection and/or diagnosis of SARS-CoV-2 by FDA under an Emergency Use Authorization (EUA). This EUA will remain in effect (meaning this test can be used) for the duration of the COVID-19 declaration under Section 564(b)(1) of the Act, 21 U.S.C. section 360bbb-3(b)(1), unless the authorization is terminated or revoked.  Performed at [facility], [address], [place], [place] [address]      Radiology Studies: No results found.  Scheduled Meds:  amLODipine  10 mg Oral Daily   carvedilol  25 mg Oral BID   clopidogrel  75 mg Oral Daily   doxycycline  100 mg Oral Q12H   DULoxetine  60 mg Oral Daily   enoxaparin (LOVENOX) injection  30 mg Subcutaneous Q24H   furosemide  40 mg Oral BID   gabapentin  300 mg Oral QHS   hydrALAZINE  25 mg Oral Q8H   insulin aspart  0-20 Units Subcutaneous TID WC   insulin aspart  0-5 Units Subcutaneous QHS   insulin aspart  6 Units Subcutaneous TID WC   [START ON 04/20/2021] insulin glargine-yfgn  12 Units Subcutaneous Daily   pantoprazole  40 mg Oral Daily   pravastatin  80 mg Oral Daily   [START ON 04/20/2021] predniSONE  10 mg Oral Q breakfast   Continuous Infusions:     LOS: 5 days    Shelly Coss, MD [facility] P2/27/2023, 11:07 AM

## 2021-04-19 NOTE — Progress Notes (Signed)
Physical Therapy Treatment Patient Details Name: Katrina Ortiz MRN: [id number] DOB: December 13, 1955 Today's Date: 04/19/2021   History of Present Illness Katrina Ortiz is a 66 y.o. female with medical history significant of Diastolic CHF, CAD with history of CABG, HTN, DM hospitalized from 2/17 to 2/19 with unresponsiveness related to hypoglycemia, AKI, and hypoxia. Pt presents to the ED with nausea, shortness of breath, generalized weakness and also complains of pain and redness of the left wrist and the right antecubital area. Negative venous ultrasound of right upper extremity for DVT but showed occlusive cephalic vein thrombosis in the right antecubital region    PT Comments    Patient is agreeable to PT. She reports left wrist pain has improved, but still painful with limited use with functional mobility. Patient ambulated into hallway with left platform walker with Min guard and occasional cues for safety. Activity tolerance limited by fatigue. Sp02 92% on room air during walking and 88% after walk. Sp02 quickly increased to the low 90's with seated rest break and cues for breathing techniques. Recommend to continue PT to maximize independence and facilitate return to prior level of function.    Recommendations for follow up therapy are one component of a multi-disciplinary discharge planning process, led by the attending physician.  Recommendations may be updated based on patient status, additional functional criteria and insurance authorization.  Follow Up Recommendations  Skilled nursing-short term rehab (<3 hours/day)     Assistance Recommended at Discharge Intermittent Supervision/Assistance  Patient can return home with the following A little help with walking and/or transfers;A little help with bathing/dressing/bathroom;Assist for transportation;Help with stairs or ramp for entrance   Equipment Recommendations  Other (comment) (left platform rolling walker)    Recommendations for  Other Services       Precautions / Restrictions Precautions Precautions: Fall Restrictions Weight Bearing Restrictions: No     Mobility  Bed Mobility Overal bed mobility: Needs Assistance Bed Mobility: Supine to Sit     Supine to sit: Min guard Sit to supine: Supervision   General bed mobility comments: CGA for safety. patient does not use LUE for pushing or pulling with bed mobility or transfers    Transfers Overall transfer level: Needs assistance Equipment used: Rolling walker (2 wheels) Transfers: Sit to/from Stand Sit to Stand: Min guard           General transfer comment: Min guard for safety using platform rolling walker    Ambulation/Gait Ambulation/Gait assistance: Min guard Gait Distance (Feet): 80 Feet Assistive device: Left platform walker Gait Pattern/deviations: Decreased step length - left, Step-through pattern (external rotation LLE with noted in stance phase of gait) Gait velocity: decreased     General Gait Details: patient ambulated into hallway. verbal cues for technique and safety. Sp02 92% on room air with ambulation. Patient at 88% after walking in seated position and increased quickly to the low 90's with cues for breathing techniques.   Stairs             Wheelchair Mobility    Modified Rankin (Stroke Patients Only)       Balance Overall balance assessment: Needs assistance Sitting-balance support: Feet supported Sitting balance-Leahy Scale: Good     Standing balance support: Bilateral upper extremity supported, During functional activity Standing balance-Leahy Scale: Fair Standing balance comment: using platform walker, no loss of balance noted  Cognition Arousal/Alertness: Awake/alert Behavior During Therapy: WFL for tasks assessed/performed Overall Cognitive Status: Within Functional Limits for tasks assessed                                           Exercises General Exercises - Upper Extremity Wrist Extension: AAROM, Strengthening, Left, 10 reps (gentle AROM through partial ROM (limited by pain). encouraged patient to elevate on pillow for comfort)    General Comments General comments (skin integrity, edema, etc.): patient with high potassium this morning- was cleared for mobility via secure chat with MD (patient has been treated for potassium already). mobility performed without oxygen in place (it was off on arrival to room). she requested to place the oxygen back on at end of session at 2 L02 for comfort      Pertinent Vitals/Pain Pain Assessment Faces Pain Scale: Hurts a little bit Pain Location: left wrist Pain Descriptors / Indicators: Discomfort Pain Intervention(s): Limited activity within patient's tolerance    Home Living                          Prior Function            PT Goals (current goals can now be found in the care plan section) Acute Rehab PT Goals Patient Stated Goal: to go to rehab if needed, then home PT Goal Formulation: With patient Time For Goal Achievement: 04/29/21 Potential to Achieve Goals: Fair Progress towards PT goals: Progressing toward goals    Frequency    Min 2X/week      PT Plan Current plan remains appropriate    Co-evaluation              AM-PAC PT "6 Clicks" Mobility   Outcome Measure  Help needed turning from your back to your side while in a flat bed without using bedrails?: A Little Help needed moving from lying on your back to sitting on the side of a flat bed without using bedrails?: A Little Help needed moving to and from a bed to a chair (including a wheelchair)?: A Little Help needed standing up from a chair using your arms (e.g., wheelchair or bedside chair)?: A Little Help needed to walk in hospital room?: A Little Help needed climbing 3-5 steps with a railing? : A Lot 6 Click Score: 17    End of Session Equipment Utilized During Treatment:  Oxygen Activity Tolerance: Patient limited by fatigue Patient left: in bed;with call bell/phone within reach;with bed alarm set Nurse Communication:  (discussed with nursing student) PT Visit Diagnosis: Unsteadiness on feet (R26.81);Other abnormalities of gait and mobility (R26.89);Muscle weakness (generalized) (M62.81);Pain;Difficulty in walking, not elsewhere classified (R26.2) Pain - Right/Left: Left Pain - part of body: Hand;Arm     Time: 1111-1135 PT Time Calculation (min) (ACUTE ONLY): 24 min  Charges:  $Gait Training: 8-22 mins $Therapeutic Activity: 8-22 mins                     Minna Merritts, PT, MPT    Percell Locus 04/19/2021, 1:08 PM

## 2021-04-19 NOTE — Progress Notes (Signed)
Inpatient Diabetes Program Recommendations  AACE/ADA: New Consensus Statement on Inpatient Glycemic Control   Target Ranges:  Prepandial:   less than 140 mg/dL      [facility] postprandial:   less than 180 mg/dL (1-2 hours)      Critically ill patients:  140 - 180 mg/dL    Latest Reference Range & Units 04/19/21 06:54 04/19/21 07:56  Glucose-Capillary 70 - 99 mg/dL 275 (H)  Novolog 10 units  D50 25 g 353 (H)  Novolog 24 units    Prednisone 20 mg    Latest Reference Range & Units 04/18/21 08:14 04/18/21 12:02 04/18/21 17:16 04/18/21 20:28  Glucose-Capillary 70 - 99 mg/dL 266 (H)  Novolog 15 units  Semglee 10 units  Prednisone 20 mg 296 (H)  Novolog 15 units 139 (H)  Novolog 7 units 151 (H)   Review of Glycemic Control  Diabetes history: DM2 Outpatient Diabetes medications: Jardiance 10 mg QAM, Lantus 10 units QHS Current orders for Inpatient glycemic control: Semglee 10 units daily, Novolog 0-20 units TID with meals, Novolog 0-5 units QHS;, Novolog 4 units TID with meals; Prednisone 10 mg daily  Inpatient Diabetes Program Recommendations:    Insulin: If steroids are continued as ordered, please consider increasing Semglee to 12 units daily and meal coverage to Novolog 6 units TID with meals.  Thanks, Barnie Alderman, RN, MSN, CDE Diabetes Coordinator Inpatient Diabetes Program [phone] (Team Pager from 8am to 5pm)

## 2021-04-19 NOTE — Assessment & Plan Note (Signed)
Treated with Lokelma, insulin, calcium gluconate.  Repeat potassium level later today

## 2021-04-20 LAB — CBC WITH DIFFERENTIAL/PLATELET
Abs Immature Granulocytes: 0.29 10*3/uL — ABNORMAL HIGH (ref 0.00–0.07)
Basophils Absolute: 0 10*3/uL (ref 0.0–0.1)
Basophils Relative: 0 %
Eosinophils Absolute: 0.1 10*3/uL (ref 0.0–0.5)
Eosinophils Relative: 1 %
HCT: 27.2 % — ABNORMAL LOW (ref 36.0–46.0)
Hemoglobin: 8.4 g/dL — ABNORMAL LOW (ref 12.0–15.0)
Immature Granulocytes: 3 %
Lymphocytes Relative: 7 %
Lymphs Abs: 0.8 10*3/uL (ref 0.7–4.0)
MCH: 26.3 pg (ref 26.0–34.0)
MCHC: 30.9 g/dL (ref 30.0–36.0)
MCV: 85 fL (ref 80.0–100.0)
Monocytes Absolute: 1.1 10*3/uL — ABNORMAL HIGH (ref 0.1–1.0)
Monocytes Relative: 10 %
Neutro Abs: 9 10*3/uL — ABNORMAL HIGH (ref 1.7–7.7)
Neutrophils Relative %: 79 %
Platelets: 273 10*3/uL (ref 150–400)
RBC: 3.2 MIL/uL — ABNORMAL LOW (ref 3.87–5.11)
RDW: 14.5 % (ref 11.5–15.5)
WBC: 11.3 10*3/uL — ABNORMAL HIGH (ref 4.0–10.5)
nRBC: 0 % (ref 0.0–0.2)

## 2021-04-20 LAB — BASIC METABOLIC PANEL
Anion gap: 8 (ref 5–15)
BUN: 93 mg/dL — ABNORMAL HIGH (ref 8–23)
CO2: 23 mmol/L (ref 22–32)
Calcium: 8.8 mg/dL — ABNORMAL LOW (ref 8.9–10.3)
Chloride: 106 mmol/L (ref 98–111)
Creatinine, Ser: 2.21 mg/dL — ABNORMAL HIGH (ref 0.44–1.00)
GFR, Estimated: 24 mL/min — ABNORMAL LOW (ref >60)
Glucose, Bld: 197 mg/dL — ABNORMAL HIGH (ref 70–99)
Potassium: 5.4 mmol/L — ABNORMAL HIGH (ref 3.5–5.1)
Sodium: 137 mmol/L (ref 135–145)

## 2021-04-20 LAB — GLUCOSE, CAPILLARY
Glucose-Capillary: 184 mg/dL — ABNORMAL HIGH (ref 70–99)
Glucose-Capillary: 226 mg/dL — ABNORMAL HIGH (ref 70–99)

## 2021-04-20 LAB — POTASSIUM: Potassium: 5.1 mmol/L (ref 3.5–5.1)

## 2021-04-20 MED ORDER — PREDNISONE 10 MG PO TABS
10.0000 mg | ORAL_TABLET | Freq: Every day | ORAL | 0 refills | Status: DC
Start: 2021-04-22 — End: 2023-09-01

## 2021-04-20 MED ORDER — OXYCODONE-ACETAMINOPHEN 5-325 MG PO TABS: 1.0000 | ORAL_TABLET | Freq: Three times a day (TID) | ORAL | 0 refills | Status: DC | PRN

## 2021-04-20 MED ORDER — LOKELMA 5 G PO PACK
5.0000 g | PACK | Freq: Every day | ORAL | 0 refills | Status: AC
Start: 2021-04-20 — End: 2021-04-27

## 2021-04-20 MED ORDER — POLYETHYLENE GLYCOL 3350 17 G PO PACK
17.0000 g | PACK | Freq: Every day | ORAL | 0 refills | Status: DC
Start: 2021-04-21 — End: 2023-09-01

## 2021-04-20 MED ORDER — SENNA 8.6 MG PO TABS
1.0000 | ORAL_TABLET | Freq: Two times a day (BID) | ORAL | 0 refills | Status: DC
Start: 2021-04-20 — End: 2021-05-12

## 2021-04-20 MED ORDER — HYDRALAZINE HCL 25 MG PO TABS: 25.0000 mg | ORAL_TABLET | Freq: Three times a day (TID) | ORAL | Status: DC

## 2021-04-20 MED ORDER — DOXYCYCLINE HYCLATE 100 MG PO TABS
100.0000 mg | ORAL_TABLET | Freq: Two times a day (BID) | ORAL | 0 refills | Status: AC
Start: 2021-04-20 — End: 2021-04-30

## 2021-04-20 MED ORDER — SODIUM ZIRCONIUM CYCLOSILICATE 10 G PO PACK
10.0000 g | PACK | Freq: Once | ORAL | Status: AC
  Administered 2021-04-20: 10 g via ORAL
  Filled 2021-04-20: qty 1

## 2021-04-20 NOTE — Progress Notes (Signed)
Called report to [facility].

## 2021-04-20 NOTE — Progress Notes (Signed)
PIV removed. Discharge instructions completed and sent to facility. Patient belongings gathered and packed to discharge. Will call report to [facility].

## 2021-04-20 NOTE — TOC Progression Note (Signed)
Transition of Care [facility]) - Progression Note    Patient Details  Name: Katrina Ortiz MRN: [id number] Date of Birth: Jul 16, 1955  Transition [facility]) CM/SW [place], LCSW Phone Number: 04/20/2021, 11:18 AM  Clinical Narrative:  [facility] can accept patient today if stable. She does not need a new COVID test. MD is going to check potassium before discharging her.   Expected Discharge Plan:  (SNF vs HH) Barriers to Discharge: Continued Medical Work up  Expected Discharge Plan and Services Expected Discharge Plan:  (SNF vs [facility])     Post Acute Care Choice:  (TBD) Living arrangements for the past 2 months: Single Family Home                                       Social Determinants of Health (SDOH) Interventions    Readmission Risk Interventions Readmission Risk Prevention Plan 04/16/2021  Transportation Screening Complete  PCP or Specialist Appt within 3-5 Days Complete  Social Work Consult for [place] Planning/Counseling Complete  Palliative Care Screening Not Applicable  Medication Review Press photographer) Complete  Some recent data might be hidden

## 2021-04-20 NOTE — Progress Notes (Signed)
Patient ID: Katrina Ortiz, female   DOB: 1955-06-11, 66 y.o.   MRN: [id number]  Subjective: The patient still notes discomfort in her wrist, especially along the radial more so than the ulnar aspect of her wrist, but feels that her wrist motion and finger motion continues to improve.  She denies any new complaints pertaining to her left wrist, and denies any numbness or paresthesias to her fingers.   Objective: Vital signs in last 24 hours: Temp:  [97.5 F (36.4 C)-98.1 F (36.7 C)] 97.8 F (36.6 C) (02/28 0723) Pulse Rate:  [68-76] 68 (02/28 0723) Resp:  [16-20] 16 (02/28 0723) BP: (119-158)/(40-59) 142/50 (02/28 0723) SpO2:  [99 %-100 %] 99 % (02/28 0723) Weight:  [96.1 kg] 96.1 kg (02/28 0241)  Intake/Output from previous day: 02/27 0701 - 02/28 0700 In: 308.6 [P.O.:240; IV Piggyback:68.6] Out: 2350 [Urine:2350] Intake/Output this shift: No intake/output data recorded.  Recent Labs    04/18/21 0642 04/19/21 0342 04/20/21 0601  HGB 8.8* 9.3* 8.4*   Recent Labs    04/19/21 0342 04/20/21 0601  WBC 11.9* 11.3*  RBC 3.48* 3.20*  HCT 29.9* 27.2*  PLT 251 273   Recent Labs    04/19/21 0342 04/19/21 1312 04/20/21 0601  NA 134*  --  137  K 6.4* 5.6* 5.4*  CL 101  --  106  CO2 21*  --  23  BUN 90*  --  93*  CREATININE 2.41*  --  2.21*  GLUCOSE 249*  --  197*  CALCIUM 8.2*  --  8.8*   No results for input(s): LABPT, INR in the last 72 hours.  Physical Exam: Orthopedic examination again is limited to the left wrist and hand.  The swelling appears to be essentially resolved from the wrist area, although there is a small amount of erythema remaining along the radial aspect of the wrist.  She notes mild-moderate tenderness to palpation in this area, but has little if any tenderness over the dorsal aspect of the wrist.  She is able to extend her wrist to 30 degrees and flex her wrist to 20 degrees with only mild discomfort at the extremes of flexion and extension.  She is  able to flex and extend all digits, and is able to extend her thumb, all without pain.  She still lacks approximately 1.5 cm from achieving her fingertips to the proximal palmar crease.  She is neurovascularly intact to her left hand.  Assessment: Left dorsal wrist cellulitis, slowly improving symptomatically.  Plan: The treatment options have been reviewed with the patient.  I feel that there is nothing surgical that needs to be done at this time.  Therefore, the patient may resume her heart healthy/carb modified diet, and may go to rehab as planned if cleared medically.  She is encouraged to continue to work her wrist and fingers.  I would recommend that she continue on her oral antibiotic regimen, specifically doxycycline, 100 mg twice daily for another 10 days.   Thank you for asking me to participate in the care of this most delightful woman.  I will sign off at this time.  If you have further orthopedic concerns during this hospitalization, please reconsult me.  Please make arrangements for her to follow-up in our office in 8 to 10 days for a repeat evaluation of her wrist.   Marshall Cork Omayra Tulloch 04/20/2021, 10:02 AM

## 2021-04-20 NOTE — TOC Transition Note (Signed)
Transition of Care [facility]) - CM/SW Discharge Note   Patient Details  Name: Katrina Ortiz MRN: [id number] Date of Birth: 25-Apr-1955  Transition of Care [facility]) CM/SW Contact:  Candie Chroman, LCSW Phone Number: 04/20/2021, 3:02 PM   Clinical Narrative:   Patient has orders to discharge to [facility] today. RN will call report to [phone] (Room 602B). EMS transport has been arranged and she is 5th on the list. No further concerns. CSW signing off.  Final next level of care: Skilled Nursing Facility Barriers to Discharge: Barriers Resolved   Patient Goals and CMS Choice   CMS Medicare.gov Compare Post Acute Care list provided to:: Patient Choice offered to / list presented to : Patient  Discharge Placement   Existing PASRR number confirmed : 04/16/21          Patient chooses bed at: [facility] [place] Patient to be transferred to facility by: EMS Name of family member notified: Patient declined. Patient and family notified of of transfer: 04/20/21  Discharge Plan and Services     Post Acute Care Choice:  (TBD)                               Social Determinants of Health (SDOH) Interventions     Readmission Risk Interventions Readmission Risk Prevention Plan 04/16/2021  Transportation Screening Complete  PCP or Specialist Appt within 3-5 Days Complete  Social Work Consult for [place] Planning/Counseling [place] Not Applicable  Medication Review [employer] photographer) Complete  Some recent data might be hidden

## 2021-04-20 NOTE — Care Management Important Message (Signed)
Important Message  Patient Details  Name: Katrina Ortiz MRN: [id number] Date of Birth: [birth year]   Medicare Important Message Given:  Yes     Dannette Barbara 04/20/2021, 11:17 AM

## 2021-04-20 NOTE — Discharge Summary (Signed)
Physician Discharge Summary  Katrina Ortiz JOA:[id number] DOB: 1955/12/14 DOA: 04/14/2021  PCP: Lowella Bandy, MD  Admit date: 04/14/2021 Discharge date: 04/20/2021  Admitted From: Home Disposition:  Home  Discharge Condition:Stable CODE STATUS:FULL Diet recommendation: Heart Healthy    Brief/Interim Summary: Katrina Ortiz is a 66 y.o. female with medical history significant of Diastolic CHF, CAD with history of CABG, HTN, DM hospitalized from 2/17 to 2/19 with unresponsiveness related to hypoglycemia with blood sugar 28 as well as AKI, and hypoxia, resolving with D10 infusion who presents to the ED with nausea, shortness of breath, generalized weakness and also complains of pain and redness of the left wrist and the right antecubital area.  On presentation she was hypertensive.  Lab work showed leukocytosis, elevated BNP.  UA was suspicious for UTI.  Chest x-ray showed bilateral pleural effusion.  Right upper extremity venous Doppler negative for DVT.  Patient was started on ceftriaxone for the cellulitis of right upper extremity,left wrist .  Cardiology consulted for new onset CHF exacerbation.  PT/OT recommending skilled nursing facility on  discharge.  Orthopedics was closely following for left wrist cellulitis.  She is medically stable for discharge to skilled nursing  facility with oral antibiotics today.  She needs to follow-up with orthopedics as an outpatient.  Following problems were addressed during her hospitalization:  Acute on chronic diastolic CHF (congestive heart failure) (HCC) Elevated BNP.  Started on Lasix 20 mg IV twice daily,now chnaged to lasix 40 mg BID PO Continue home carvedilol.  Echocardiogram showed EF of 60 to 60%, grade 1 diastolic dysfunction. Cardiology was following.  We recommend outpatient cardiology follow-up   Cellulitis of left wrist Started on ceftriaxone. Unclear etiology.  Patient has history of psoriasis.  X-ray of the wrist did not show any  fracture or dislocation but showed a small radiopaque in the soft tissue.  She has cellulitis changes on the right wrist with severe tenderness and edema. Elevated ESR, CRP.ANA negative but rheumatoid factor came out to be positive.  Uric acid level is normal.  Started on prednisone for possibility of inflammatory arthritis, now being tapered.  Continue pain management, supportive care.  Leukocytosis is likely secondary to steroids, improved.  Inflammatory arthritis is a possibility.  She needs to follow-up with rheumatology as an outpatient We also consulted orthopedics . Recommended to continue antibiotics.  Antibiotics changed to doxycycline.  Orthopedics recommending outpatient follow-up.    Hyperkalemia Persistent hyperkalemia.  Most likely secondary to CKD.  Continue Lokelma 5 mg daily for 7 days.  Check BMP in 3 days   Physical deconditioning PT/OT recommending skilled nursing  facility on discharge   UTI (urinary tract infection) UA was suspicious for UTI.  Urine culture shows pansensitive E. coli.  Was treated with antibiotic   CKD (chronic kidney disease) stage 4, GFR 15-29 ml/min (HCC) Currently kidney function at baseline.  Continues to follow-up with her nephrologist as an outpatient.  Follows with [facility] nephrology   Essential hypertension Continue Coreg, amlodipine.  Added hydralazine   Hx of CABG Continue aspirin and statin Troponins negative, EKG nonacute and no complaints of chest pain   Anemia of chronic kidney failure, stage 4 (severe) (HCC) Currently hemoglobin stable   Cellulitis of right forearm Possible thrombophlebitis given location and right antecubital fossa, site of location of recent IV injection Negative venous ultrasound of right upper extremity for DVT but showed occlusive cephalic vein thrombosis in the right antecubital region.  Resolved     Discharge Diagnoses:  Principal  Problem:   Acute on chronic diastolic CHF (congestive heart failure)  (HCC) Active Problems:   Cellulitis of left wrist   Physical deconditioning   Hyperkalemia   UTI (urinary tract infection)   CKD (chronic kidney disease) stage 4, GFR 15-29 ml/min (HCC)   Type 2 diabetes mellitus (HCC)   Cellulitis of right forearm   Anemia of chronic kidney failure, stage 4 (severe) (HCC)   Hx of CABG   Essential hypertension    Discharge Instructions  Discharge Instructions     Diet - low sodium heart healthy   Complete by: As directed    Discharge instructions   Complete by: As directed    1)Please take prescribed medications as instructed 2)Follow up with orthopedics in 2 weeks.  Name and number of the provider has been attached 3)Follow up with your cardiologist as an outpatient.  Follow-up with your nephrologist as an outpatient 4)Follow up with a rheumatologist as an outpatient for your psoriasis and possible inflammatory arthritis 5)Do a CBC and BMP test in 3 days   Increase activity slowly   Complete by: As directed       Allergies as of 04/20/2021   No Known Allergies      Medication List     STOP taking these medications    valsartan 40 MG tablet Commonly known as: DIOVAN       TAKE these medications    amLODipine 10 MG tablet Commonly known as: NORVASC Take 10 mg by mouth daily.   carvedilol 25 MG tablet Commonly known as: COREG Take 25 mg by mouth 2 (two) times daily.   clopidogrel 75 MG tablet Commonly known as: PLAVIX Take 75 mg by mouth daily.   doxycycline 100 MG tablet Commonly known as: VIBRA-TABS Take 1 tablet (100 mg total) by mouth every 12 (twelve) hours for 10 days.   DULoxetine 60 MG capsule Commonly known as: CYMBALTA Take 60 mg by mouth daily.   furosemide 40 MG tablet Commonly known as: LASIX Take 40 mg by mouth 2 (two) times daily.   gabapentin 300 MG capsule Commonly known as: NEURONTIN Take 300 mg by mouth at bedtime.   hydrALAZINE 25 MG tablet Commonly known as: APRESOLINE Take 1 tablet  (25 mg total) by mouth every 8 (eight) hours.   Jardiance 10 MG Tabs tablet Generic drug: empagliflozin Take 10 mg by mouth every morning.   Lantus SoloStar 100 UNIT/ML Solostar Pen Generic drug: insulin glargine Inject 10 Units into the skin at bedtime. Hold if sugar < 100   Lokelma 5 g packet Generic drug: sodium zirconium cyclosilicate Take 5 g by mouth daily for 7 days.   omeprazole 20 MG capsule Commonly known as: PRILOSEC Take 20 mg by mouth daily at 12 noon.   oxyCODONE-acetaminophen 5-325 MG tablet Commonly known as: PERCOCET/ROXICET Take 1 tablet by mouth every 8 (eight) hours as needed for moderate pain.   polyethylene glycol 17 g packet Commonly known as: MIRALAX / GLYCOLAX Take 17 g by mouth daily. Start taking on: April 21, 2021   pravastatin 80 MG tablet Commonly known as: PRAVACHOL Take 80 mg by mouth daily.   predniSONE 10 MG tablet Commonly known as: DELTASONE Take 1 tablet (10 mg total) by mouth daily with breakfast. Start taking on: April 22, 2021   senna 8.6 MG Tabs tablet Commonly known as: SENOKOT Take 1 tablet (8.6 mg total) by mouth 2 (two) times daily.        Contact information for follow-up  providers     Poggi, Marshall Cork, MD. Schedule an appointment as soon as possible for a visit in 2 week(s).   Specialty: Orthopedic Surgery Contact information: [place] [address] [phone]              Contact information for after-discharge care     Destination     [facility] Preferred SNF .   Service: Skilled Nursing Contact information: [address] [place] [phone]                    No Known Allergies  Consultations: Cardiology, orthopedics   Procedures/Studies: DG Chest 2 View  Result Date: 04/14/2021 CLINICAL DATA:  Dizziness and weakness. EXAM: CHEST - 2 VIEW COMPARISON:  April 09, 2021 FINDINGS: Multiple  sternal wires are seen. Decreased lung volumes are noted with mild, diffuse, chronic appearing increased interstitial lung markings. Mild atelectasis is seen along the medial aspect of the left lung base. A very small left pleural effusion is noted. This is mildly decreased in size when compared to the prior exam. No pneumothorax is identified. The cardiac silhouette is mildly enlarged and unchanged in size. Moderate severity calcification of the aortic arch is noted. Degenerative changes are seen throughout the thoracic spine. IMPRESSION: 1. Evidence of prior median sternotomy/CABG. 2. Chronic appearing increased interstitial lung markings with mild left basilar atelectasis. 3. Small left pleural effusion, mildly decreased in size when compared to the prior study. Electronically Signed   By: Virgina Norfolk M.D.   On: 04/14/2021 00:22   DG Wrist Complete Left  Result Date: 04/14/2021 CLINICAL DATA:  Dizziness, weakness and left arm pain. EXAM: LEFT WRIST - COMPLETE 3+ VIEW COMPARISON:  None. FINDINGS: There is no evidence of fracture or dislocation. Marked severity degenerative changes are seen involving the carpometacarpal articulation of the left thumb. There is mild diffuse soft tissue swelling with a small, thin linear radiopaque soft tissue foreign body seen within the soft tissues adjacent to the lateral aspect of the distal left radius. IMPRESSION: Marked severity degenerative changes, without evidence of an acute osseous abnormality. Electronically Signed   By: Virgina Norfolk M.D.   On: 04/14/2021 00:25   DG Abd 1 View  Result Date: 04/11/2021 CLINICAL DATA:  Hypoxia and hypoglycemia.  Nausea. EXAM: ABDOMEN - 1 VIEW COMPARISON:  None. FINDINGS: Bowel gas pattern is normal. No evidence of ileus or obstruction. No abnormal calcifications. Tubal ligation clips in the pelvis. Chronic degenerative changes of the lumbar spine. IMPRESSION: No acute or significant finding. Electronically Signed   By:  Nelson Chimes M.D.   On: 04/11/2021 15:55   CT CHEST WO CONTRAST  Result Date: 04/09/2021 CLINICAL DATA:  Pneumonia. Complication suspected. Found unresponsive. EXAM: CT CHEST WITHOUT CONTRAST TECHNIQUE: Multidetector CT imaging of the chest was performed following the standard protocol without IV contrast. RADIATION DOSE REDUCTION: This exam was performed according to the departmental dose-optimization program which includes automated exposure control, adjustment of the mA and/or kV according to patient size and/or use of iterative reconstruction technique. COMPARISON:  Chest radiography same day. FINDINGS: Cardiovascular: Mild cardiomegaly. No pericardial fluid. Previous median sternotomy and CABG. Aortic atherosclerosis without aneurysm. Mediastinum/Nodes: No mediastinal mass or lymphadenopathy visible on this study done without contrast. Normal size mediastinal nodes. Lungs/Pleura: Small bilateral effusions layering dependently. Mild atelectasis of the dependent right upper lobe. Minimal peripheral scarring in the right middle lobe. Mild dependent atelectasis in the right  lower lobe. In the left lung, there is more extensive infiltrate and collapse in the lower lobe consistent with bronchopneumonia. Follow-up to clearing is recommended. Left upper lobe shows a few scattered patchy opacities consistent with patchy inflammatory infiltrate. Upper Abdomen: Negative Musculoskeletal: Chronic ankylosis throughout the thoracic region, with exception of T7-8 which retains some mobility. IMPRESSION: Bilateral pleural effusions layering dependently, larger on left than the right. Mild dependent pulmonary atelectasis. More extensive infiltrate and volume loss in the left lower lobe suggesting infectious bronchopneumonia. Few other minimal scattered patchy inflammatory type densities also in the left upper lobe. Follow-up to clearing recommended. Aortic Atherosclerosis (ICD10-I70.0). Electronically Signed   By: Nelson Chimes M.D.   On: 04/09/2021 17:09   US Venous Img Upper Uni Right(DVT)  Result Date: 04/14/2021 CLINICAL DATA:  Right arm swelling for 1 day EXAM: RIGHT UPPER EXTREMITY VENOUS DOPPLER ULTRASOUND TECHNIQUE: Gray-scale sonography with graded compression, as well as color Doppler and duplex ultrasound were performed to evaluate the upper extremity deep venous system from the level of the subclavian vein and including the jugular, axillary, basilic, radial, ulnar and upper cephalic vein. Spectral Doppler was utilized to evaluate flow at rest and with distal augmentation maneuvers. COMPARISON:  None. FINDINGS: Contralateral Subclavian Vein: Respiratory phasicity is normal and symmetric with the symptomatic side. No evidence of thrombus. Normal compressibility. Internal Jugular Vein: No evidence of thrombus. Subclavian Vein: No evidence of thrombus. Axillary Vein: No evidence of thrombus. Cephalic Vein: Occlusive thrombus at the level of the antecubital fossa involving a segment at least 3 cm in length. This is in the superficial system. Basilic Vein: No evidence of thrombus. Brachial Veins: No evidence of thrombus. Radial Veins: No evidence of thrombus. Ulnar Veins: No evidence of thrombus. Venous Reflux:  None visualized. IMPRESSION: Occlusive cephalic vein thrombosis in the right antecubital region, reportedly site of prior IV access. Electronically Signed   By: Jorje Guild M.D.   On: 04/14/2021 04:24   DG Chest Port 1 View  Result Date: 04/09/2021 CLINICAL DATA:  Unresponsive. EXAM: PORTABLE CHEST 1 VIEW COMPARISON:  04/11/2019 FINDINGS: 1506 hours. The cardio pericardial silhouette is enlarged. Interstitial markings are diffusely coarsened with chronic features. Retrocardiac collapse/consolidation is associated with a small left pleural effusion. The visualized bony structures of the thorax show no acute abnormality. Telemetry leads overlie the chest. IMPRESSION: Retrocardiac collapse/consolidation with  small left pleural effusion. Electronically Signed   By: Misty Stanley M.D.   On: 04/09/2021 15:18   ECHOCARDIOGRAM COMPLETE  Result Date: 04/15/2021    ECHOCARDIOGRAM REPORT   Patient Name:   Katrina Ortiz Date of Exam: 04/15/2021 Medical Rec #:  [id number]      Height:       63.0 in Accession #:    [id number]     Weight:       208.8 lb Date of Birth:  1955/07/31     BSA:          1.969 m Patient Age:    39 years       BP:           165/68 mmHg Patient Gender: F              HR:           95 bpm. Exam Location:  [facility] Procedure: 2D Echo, Cardiac Doppler, Color Doppler and Intracardiac            Opacification Agent Indications:     J03.00 Acute Diastolic Heart Failure  History:         Patient has prior history of Echocardiogram examinations, most                  recent 04/11/2019. Risk Factors:Hypertension and Diabetes.  Sonographer:     Cresenciano Lick RDCS Referring Phys:  [id number] Kate Sable Diagnosing Phys: Ida Rogue MD IMPRESSIONS  1. Left ventricular ejection fraction, by estimation, is 60 to 65%. The left ventricle has normal function. The left ventricle has no regional wall motion abnormalities. Left ventricular diastolic parameters are consistent with Grade I diastolic dysfunction (impaired relaxation).  2. Right ventricular systolic function is normal. The right ventricular size is normal. Tricuspid regurgitation signal is inadequate for assessing PA pressure.  3. The mitral valve is normal in structure. Mild mitral valve regurgitation. No evidence of mitral stenosis.  4. The aortic valve is normal in structure. Aortic valve regurgitation is not visualized. No aortic stenosis is present.  5. The inferior vena cava is normal in size with greater than 50% respiratory variability, suggesting right atrial pressure of 3 mmHg. FINDINGS  Left Ventricle: Left ventricular ejection fraction, by estimation, is 60 to 65%. The left ventricle has normal function. The left ventricle has no regional  wall motion abnormalities. Definity contrast agent was given IV to delineate the left ventricular  endocardial borders. The left ventricular internal cavity size was normal in size. There is no left ventricular hypertrophy. Left ventricular diastolic parameters are consistent with Grade I diastolic dysfunction (impaired relaxation). Right Ventricle: The right ventricular size is normal. No increase in right ventricular wall thickness. Right ventricular systolic function is normal. Tricuspid regurgitation signal is inadequate for assessing PA pressure. Left Atrium: Left atrial size was normal in size. Right Atrium: Right atrial size was normal in size. Pericardium: There is no evidence of pericardial effusion. Mitral Valve: The mitral valve is normal in structure. Mild mitral valve regurgitation. No evidence of mitral valve stenosis. Tricuspid Valve: The tricuspid valve is normal in structure. Tricuspid valve regurgitation is not demonstrated. No evidence of tricuspid stenosis. Aortic Valve: The aortic valve is normal in structure. Aortic valve regurgitation is not visualized. No aortic stenosis is present. Pulmonic Valve: The pulmonic valve was normal in structure. Pulmonic valve regurgitation is not visualized. No evidence of pulmonic stenosis. Aorta: The aortic root is normal in size and structure. Venous: The inferior vena cava is normal in size with greater than 50% respiratory variability, suggesting right atrial pressure of 3 mmHg. IAS/Shunts: No atrial level shunt detected by color flow Doppler.  LEFT VENTRICLE PLAX 2D LVIDd:         4.40 cm   Diastology LVIDs:         3.00 cm   LV e' medial:    5.72 cm/s LV PW:         1.10 cm   LV E/e' medial:  12.4 LV IVS:        1.00 cm   LV e' lateral:   8.45 cm/s LVOT diam:     1.90 cm   LV E/e' lateral: 8.4 LV SV:         67 LV SV Index:   34 LVOT Area:     2.84 cm  RIGHT VENTRICLE            IVC RV Basal diam:  3.90 cm    IVC diam: 1.90 cm RV S prime:     9.98 cm/s  TAPSE (M-mode): 1.7 cm LEFT ATRIUM  Index        RIGHT ATRIUM           Index LA diam:        4.40 cm 2.23 cm/m   RA Area:     15.30 cm LA Vol (A2C):   62.3 ml 31.64 ml/m  RA Volume:   41.80 ml  21.23 ml/m LA Vol (A4C):   42.1 ml 21.38 ml/m LA Biplane Vol: 51.6 ml 26.20 ml/m  AORTIC VALVE LVOT Vmax:   105.00 cm/s LVOT Vmean:  79.000 cm/s LVOT VTI:    0.238 m  AORTA Ao Root diam: 3.00 cm MITRAL VALVE MV Area (PHT): 4.39 cm     SHUNTS MV Decel Time: 173 msec     Systemic VTI:  0.24 m MV E velocity: 70.70 cm/s   Systemic Diam: 1.90 cm MV A velocity: 110.00 cm/s MV E/A ratio:  0.64 Ida Rogue MD Electronically signed by Ida Rogue MD Signature Date/Time: 04/15/2021/1:11:36 PM    Final    CT CHEST ABDOMEN PELVIS WO CONTRAST  Result Date: 04/14/2021 CLINICAL DATA:  Shortness of breath, weakness and nausea. EXAM: CT CHEST, ABDOMEN AND PELVIS WITHOUT CONTRAST TECHNIQUE: Multidetector CT imaging of the chest, abdomen and pelvis was performed following the standard protocol without IV contrast. RADIATION DOSE REDUCTION: This exam was performed according to the departmental dose-optimization program which includes automated exposure control, adjustment of the mA and/or kV according to patient size and/or use of iterative reconstruction technique. COMPARISON:  Chest CT without contrast 04/09/2021, CT abdomen pelvis with IV contrast 02/27/2019 FINDINGS: CT CHEST FINDINGS Cardiovascular: The cardiac size is upper-normal. Pulmonary veins are normal caliber and there is no significant pericardial effusion. There is heavy native three-vessel calcific CAD with sternotomy changes and old CABG. There is aortic atherosclerosis without aneurysm. The pulmonary arteries are normal caliber. Mediastinum/Nodes: The lower poles of the thyroid gland, trachea, thoracic esophagus are unremarkable. Axillary spaces are clear. No intrathoracic adenopathy is seen. Lungs/Pleura: There is a moderate-sized left and small  right layering pleural effusions, both increased. Adjacent consolidation or atelectasis in left lower lobe appears similar to the previous exam as well as posterior atelectasis in the right lower lobe. Scar-like opacities are again seen in the right middle lobe with additional ground-glass pneumonitis or atelectasis posteriorly in the right upper lobe also unchanged in appearance. Left upper lobe is clear except for atelectasis in the lingular base. The central airways are clear. 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 Osteopenia. CT ABDOMEN PELVIS FINDINGS Hepatobiliary: 19 cm length mildly steatotic liver, otherwise unremarkable without contrast. Gallbladder and bile ducts are unremarkable. Pancreas: Partially atrophic, otherwise unremarkable without contrast. Spleen: Normal in size and noncontrast attenuation. Adrenals/Urinary Tract: There is no adrenal mass. Wedge-shaped scar-like defect noted in the left upper renal cortex, otherwise no focal abnormality in the unenhanced renal cortex is seen. Perinephric stranding is similar to prior study as well as trace perinephric fluid. There is no evidence of urinary stone or obstruction. The bladder wall is thickened but not fully distended and there is air in the bladder. Stomach/Bowel: No dilatation or wall thickening including the appendix. Scattered colonic diverticula without focal inflammation. Vascular/Lymphatic: Aortic atherosclerosis. No enlarged abdominal or pelvic lymph nodes. Reproductive: The uterus is intact. There are BTL changes and a subserosal 3.1 cm fundal fibroid again noted of the right. No adnexal mass is seen. The endometrium is more prominent than usually seen at this age, measuring 8 mm. A similar  finding was seen previously. Nonemergent ultrasound follow-up is recommended. Other: Small umbilical fat hernia. There is trace posterior deep pelvic ascites. There is no free air, hemorrhage or abscess.  Musculoskeletal: Advanced facet hypertrophy lower lumbar spine with chronic grade 1 L4-5 degenerative anterolisthesis and acquired spinal stenosis. Similar severe spinal stenosis L3-4. No destructive bone lesion is seen. IMPRESSION: 1. Moderate left and small layering right pleural effusions, both increased from 5 days ago. Adjacent atelectasis or consolidation in the left lower lobe appears similar. 2. Resolution of small ground-glass infiltrates in the posterior left upper lobe previously with similar appearance of ground-glass atelectasis or infiltrates in the posterior right upper lobe. 3. Prior CABG, with upper-normal heart size, aortic and coronary artery atherosclerosis. 4. Cystitis versus bladder nondistention, with air in the bladder which could be due to instrumentation or infectious process. Correlate clinically with urinalysis. 5. Additional chronic findings in the abdomen and pelvis with no other new abnormality. 6. Prominent endometrium for age. A similar finding was noted previously. Nonemergent ultrasound follow-up recommended. 7. Trace ascites. 8. Osteopenia and degenerative change with severe acquired spinal stenosis L3-4 and L4-5. Electronically Signed   By: Telford Nab M.D.   On: 04/14/2021 03:32   US ABDOMEN LIMITED RUQ (LIVER/GB)  Result Date: 04/11/2021 CLINICAL DATA:  66 year old female with nausea and vomiting for 1 day EXAM: ULTRASOUND ABDOMEN LIMITED RIGHT UPPER QUADRANT COMPARISON:  02/27/2019 CT. FINDINGS: Gallbladder: A few mobile gallstones are noted, the largest measuring 5 mm. There is no evidence of gallbladder wall thickening, pericholecystic fluid or sonographic Murphy sign. Common bile duct: Diameter: 3.2 mm. No intrahepatic or extrahepatic biliary dilatation identified. Liver: No focal lesion identified. Within normal limits in parenchymal echogenicity. Portal vein is patent on color Doppler imaging with normal direction of blood flow towards the liver. Other: None.  IMPRESSION: 1. Cholelithiasis without evidence of acute cholecystitis. 2. No other significant abnormalities.  No biliary dilatation. Electronically Signed   By: Margarette Canada M.D.   On: 04/11/2021 18:11      Subjective: Patient seen and examined at the bedside this morning.  Hemodynamically stable for discharge today.  Discharge Exam: Vitals:   04/20/21 0723 04/20/21 1153  BP: (!) 142/50 (!) 127/44  Pulse: 68 69  Resp: 16 20  Temp: 97.8 F (36.6 C) 97.8 F (36.6 C)  SpO2: 99% 100%   Vitals:   04/20/21 0407 04/20/21 0618 04/20/21 0723 04/20/21 1153  BP: (!) 119/41 (!) 139/59 (!) 142/50 (!) 127/44  Pulse: 72 69 68 69  Resp: _0 Temp: 97.7 F (36.5 C)  97.8 F (36.6 C) 97.8 F (36.6 C)  TempSrc: Oral  Oral Oral  SpO2: 99%  99% 100%  Weight:      Height:        General: Pt is alert, awake, not in acute distress Cardiovascular: RRR, S1/S2 +, no rubs, no gallops Respiratory: CTA bilaterally, no wheezing, no rhonchi Abdominal: Soft, NT, ND, bowel sounds + Extremities: no edema, no cyanosis    The results of significant diagnostics from this hospitalization (including imaging, microbiology, ancillary and laboratory) are listed below for reference.     Microbiology: Recent Results (from the past 240 hour(s))  Urine Culture     Status: Abnormal   Collection Time: 04/12/21  7:35 PM   Specimen: Urine, Clean Catch  Result Value Ref Range Status   Specimen Description   Final    URINE, CLEAN CATCH Performed at [facility], [address]  [place], [place] [address]    Special Requests   Final    NONE Performed at [facility], [address] [place], [place] [address]    Culture >=100,000 COLONIES/mL ESCHERICHIA COLI (A)  Final   Report Status 04/16/2021 FINAL  Final   Organism ID, Bacteria ESCHERICHIA COLI (A)  Final      Susceptibility   Escherichia coli - MIC*    AMPICILLIN <=2 SENSITIVE Sensitive     CEFAZOLIN <=4 SENSITIVE Sensitive      CEFEPIME <=0.12 SENSITIVE Sensitive     CEFTRIAXONE <=0.25 SENSITIVE Sensitive     CIPROFLOXACIN <=0.25 SENSITIVE Sensitive     GENTAMICIN <=1 SENSITIVE Sensitive     IMIPENEM <=0.25 SENSITIVE Sensitive     NITROFURANTOIN <=16 SENSITIVE Sensitive     TRIMETH/SULFA <=20 SENSITIVE Sensitive     AMPICILLIN/SULBACTAM <=2 SENSITIVE Sensitive     PIP/TAZO <=4 SENSITIVE Sensitive     * >=100,000 COLONIES/mL ESCHERICHIA COLI  Resp Panel by RT-PCR (Flu A&B, Covid) Nasopharyngeal Swab     Status: None   Collection Time: 04/14/21  2:40 AM   Specimen: Nasopharyngeal Swab; Nasopharyngeal(NP) swabs in vial transport medium  Result Value Ref Range Status   SARS Coronavirus 2 by RT PCR NEGATIVE NEGATIVE Final    Comment: (NOTE) SARS-CoV-2 target nucleic acids are NOT DETECTED.  The SARS-CoV-2 RNA is generally detectable in upper respiratory specimens during the acute phase of infection. The lowest concentration of SARS-CoV-2 viral copies this assay can detect is 138 copies/mL. A negative result does not preclude SARS-Cov-2 infection and should not be used as the sole basis for treatment or other patient management decisions. A negative result may occur with  improper specimen collection/handling, submission of specimen other than nasopharyngeal swab, presence of viral mutation(s) within the areas targeted by this assay, and inadequate number of viral copies(<138 copies/mL). A negative result must be combined with clinical observations, patient history, and epidemiological information. The expected result is Negative.  Fact Sheet for Patients:  EntrepreneurPulse.com.au  Fact Sheet for Healthcare Providers:  IncredibleEmployment.be  This test is no t yet approved or cleared by the [place] FDA and  has been authorized for detection and/or diagnosis of SARS-CoV-2 by FDA under an Emergency Use Authorization (EUA). This EUA will remain  in effect  (meaning this test can be used) for the duration of the COVID-19 declaration under Section 564(b)(1) of the Act, 21 U.S.C.section 360bbb-3(b)(1), unless the authorization is terminated  or revoked sooner.       Influenza A by PCR NEGATIVE NEGATIVE Final   Influenza B by PCR NEGATIVE NEGATIVE Final    Comment: (NOTE) The Xpert Xpress SARS-CoV-2/FLU/RSV plus assay is intended as an aid in the diagnosis of influenza from Nasopharyngeal swab specimens and should not be used as a sole basis for treatment. Nasal washings and aspirates are unacceptable for Xpert Xpress SARS-CoV-2/FLU/RSV testing.  Fact Sheet for Patients: EntrepreneurPulse.com.au  Fact Sheet for Healthcare Providers: IncredibleEmployment.be  This test is not yet approved or cleared by the [place] FDA and has been authorized for detection and/or diagnosis of SARS-CoV-2 by FDA under an Emergency Use Authorization (EUA). This EUA will remain in effect (meaning this test can be used) for the duration of the COVID-19 declaration under Section 564(b)(1) of the Act, 21 U.S.C. section 360bbb-3(b)(1), unless the authorization is terminated or revoked.  Performed at [facility], [address] [place], [place] [address]      Labs: BNP (last 3 results) Recent  Labs    04/13/21 2354  BNP 161.0*   Basic Metabolic Panel: Recent Labs  Lab 04/16/21 9604 04/17/21 0332 04/18/21 0642 04/19/21 0342 04/19/21 1312 04/20/21 0601 04/20/21 1155  NA 130* 132* 131* 134*  --  137  --   K 4.6 4.9 5.3* 6.4* 5.6* 5.4* 5.1  CL 102 101 102 101  --  106  --   CO2 18* 20* 21* 21*  --  23  --   GLUCOSE 315* 138* 247* 249*  --  197*  --   BUN 59* 74* 81* 90*  --  93*  --   CREATININE 2.62* 2.49* 2.63* 2.41*  --  2.21*  --   CALCIUM 7.6* 8.0* 7.8* 8.2*  --  8.8*  --    Liver Function Tests: No results for input(s): AST, ALT, ALKPHOS, BILITOT, PROT, ALBUMIN in the last 168  hours. No results for input(s): LIPASE, AMYLASE in the last 168 hours. No results for input(s): AMMONIA in the last 168 hours. CBC: Recent Labs  Lab 04/16/21 0632 04/17/21 0332 04/18/21 0642 04/19/21 0342 04/20/21 0601  WBC 15.2* 14.0* 11.2* 11.9* 11.3*  NEUTROABS 13.3* 12.7* 9.3* 10.8* 9.0*  HGB 8.3* 8.7* 8.8* 9.3* 8.4*  HCT 25.5* 27.7* 27.9* 29.9* 27.2*  MCV 84.4 85.2 85.1 85.9 85.0  PLT 170 208 238 251 273   Cardiac Enzymes: No results for input(s): CKTOTAL, CKMB, CKMBINDEX, TROPONINI in the last 168 hours. BNP: Invalid input(s): POCBNP CBG: Recent Labs  Lab 04/19/21 1305 04/19/21 1816 04/19/21 2022 04/20/21 0724 04/20/21 1154  GLUCAP 321* 322* 307* 184* 226*   D-Dimer No results for input(s): DDIMER in the last 72 hours. Hgb A1c No results for input(s): HGBA1C in the last 72 hours. Lipid Profile No results for input(s): CHOL, HDL, LDLCALC, TRIG, CHOLHDL, LDLDIRECT in the last 72 hours. Thyroid function studies No results for input(s): TSH, T4TOTAL, T3FREE, THYROIDAB in the last 72 hours.  Invalid input(s): FREET3 Anemia work up No results for input(s): VITAMINB12, FOLATE, FERRITIN, TIBC, IRON, RETICCTPCT in the last 72 hours. Urinalysis    Component Value Date/Time   COLORURINE YELLOW (A) 04/12/2021 1935   APPEARANCEUR HAZY (A) 04/12/2021 1935   LABSPEC 1.010 04/12/2021 1935   PHURINE 5.0 04/12/2021 1935   GLUCOSEU 150 (A) 04/12/2021 1935   HGBUR MODERATE (A) 04/12/2021 [place] NEGATIVE 04/12/2021 1935   KETONESUR 5 (A) 04/12/2021 1935   PROTEINUR 100 (A) 04/12/2021 1935   NITRITE NEGATIVE 04/12/2021 1935   LEUKOCYTESUR TRACE (A) 04/12/2021 1935   Sepsis Labs Invalid input(s): PROCALCITONIN,  WBC,  LACTICIDVEN Microbiology Recent Results (from the past 240 hour(s))  Urine Culture     Status: Abnormal   Collection Time: 04/12/21  7:35 PM   Specimen: Urine, Clean Catch  Result Value Ref Range Status   Specimen Description   Final     URINE, CLEAN CATCH Performed at [facility], [address] [place], [place] [address]    Special Requests   Final    NONE Performed at [facility], [address] [place], [place] [address]    Culture >=100,000 COLONIES/mL ESCHERICHIA COLI (A)  Final   Report Status 04/16/2021 FINAL  Final   Organism ID, Bacteria ESCHERICHIA COLI (A)  Final      Susceptibility   Escherichia coli - MIC*    AMPICILLIN <=2 SENSITIVE Sensitive     CEFAZOLIN <=4 SENSITIVE Sensitive     CEFEPIME <=0.12 SENSITIVE Sensitive  CEFTRIAXONE <=0.25 SENSITIVE Sensitive     CIPROFLOXACIN <=0.25 SENSITIVE Sensitive     GENTAMICIN <=1 SENSITIVE Sensitive     IMIPENEM <=0.25 SENSITIVE Sensitive     NITROFURANTOIN <=16 SENSITIVE Sensitive     TRIMETH/SULFA <=20 SENSITIVE Sensitive     AMPICILLIN/SULBACTAM <=2 SENSITIVE Sensitive     PIP/TAZO <=4 SENSITIVE Sensitive     * >=100,000 COLONIES/mL ESCHERICHIA COLI  Resp Panel by RT-PCR (Flu A&B, Covid) Nasopharyngeal Swab     Status: None   Collection Time: 04/14/21  2:40 AM   Specimen: Nasopharyngeal Swab; Nasopharyngeal(NP) swabs in vial transport medium  Result Value Ref Range Status   SARS Coronavirus 2 by RT PCR NEGATIVE NEGATIVE Final    Comment: (NOTE) SARS-CoV-2 target nucleic acids are NOT DETECTED.  The SARS-CoV-2 RNA is generally detectable in upper respiratory specimens during the acute phase of infection. The lowest concentration of SARS-CoV-2 viral copies this assay can detect is 138 copies/mL. A negative result does not preclude SARS-Cov-2 infection and should not be used as the sole basis for treatment or other patient management decisions. A negative result may occur with  improper specimen collection/handling, submission of specimen other than nasopharyngeal swab, presence of viral mutation(s) within the areas targeted by this assay, and inadequate number of viral copies(<138 copies/mL). A negative result must be  combined with clinical observations, patient history, and epidemiological information. The expected result is Negative.  Fact Sheet for Patients:  EntrepreneurPulse.com.au  Fact Sheet for Healthcare Providers:  IncredibleEmployment.be  This test is no t yet approved or cleared by the [place] FDA and  has been authorized for detection and/or diagnosis of SARS-CoV-2 by FDA under an Emergency Use Authorization (EUA). This EUA will remain  in effect (meaning this test can be used) for the duration of the COVID-19 declaration under Section 564(b)(1) of the Act, 21 U.S.C.section 360bbb-3(b)(1), unless the authorization is terminated  or revoked sooner.       Influenza A by PCR NEGATIVE NEGATIVE Final   Influenza B by PCR NEGATIVE NEGATIVE Final    Comment: (NOTE) The Xpert Xpress SARS-CoV-2/FLU/RSV plus assay is intended as an aid in the diagnosis of influenza from Nasopharyngeal swab specimens and should not be used as a sole basis for treatment. Nasal washings and aspirates are unacceptable for Xpert Xpress SARS-CoV-2/FLU/RSV testing.  Fact Sheet for Patients: EntrepreneurPulse.com.au  Fact Sheet for Healthcare Providers: IncredibleEmployment.be  This test is not yet approved or cleared by the [place] FDA and has been authorized for detection and/or diagnosis of SARS-CoV-2 by FDA under an Emergency Use Authorization (EUA). This EUA will remain in effect (meaning this test can be used) for the duration of the COVID-19 declaration under Section 564(b)(1) of the Act, 21 U.S.C. section 360bbb-3(b)(1), unless the authorization is terminated or revoked.  Performed at [facility], [address] [place], [place] [address]     Please note: You were cared for by a hospitalist during your hospital stay. Once you are discharged, your primary care physician will handle any further medical  issues. Please note that NO REFILLS for any discharge medications will be authorized once you are discharged, as it is imperative that you return to your primary care physician (or establish a relationship with a primary care physician if you do not have one) for your post hospital discharge needs so that they can reassess your need for medications and monitor your lab values.    Time coordinating discharge: 40 minutes  SIGNED:  Shelly Coss, MD  [facility] 04/20/2021, 1:28 PM Pager [phone]  If 7PM-7AM, please contact night-coverage [email or web address] Password [email or web address]

## 2021-04-23 ENCOUNTER — Ambulatory Visit: Payer: HMO | Admitting: Family

## 2021-05-02 NOTE — Progress Notes (Deleted)
Patient:  Katrina Ortiz   DOB:  [birth year]   MRN:  [id number]  Katrina Ortiz is a 66 yo female with a PMHx: DM, psoriasis, HTN, chronic diastolic HF, CKD IV, CABG.   Admitted 2/23-2/28/23 with HF exacerbation, cellulitis of the L wrist, and UTI. She was diurese and treated with IV abx, ultimately d/c'd to a SNF for rehab.  Echo from 04/15/21 reviewed and showed: EF 60-65%. The left ventricle has normal function. The left ventricle has no  regional wall motion abnormalities.  There is no left ventricular hypertrophy. Left ventricular diastolic parameters are consistent with Grade I diastolic dysfunction (impaired relaxation).   She presents today for her initial visit to the heart failure clinic with a chief complaint of  Past Medical History:  Diagnosis Date   Diabetes mellitus without complication ([place])    Hypertension    Psoriasis    Past Surgical History:  Procedure Laterality Date   CESAREAN SECTION     2 c-sections   CORONARY ARTERY BYPASS GRAFT     CORONARY STENT PLACEMENT     HERNIA REPAIR     TUBAL LIGATION     No family history on file. Social History   Tobacco Use   Smoking status: Former    Types: Cigarettes    Quit date: 11/22/2002    Years since quitting: 18.4   Smokeless tobacco: Never  Substance Use Topics   Alcohol use: No   No Known Allergies Prior to Admission medications   Medication Sig Start Date End Date Taking? Authorizing Provider  amLODipine (NORVASC) 10 MG tablet Take 10 mg by mouth daily. 12/28/20   [provider]  carvedilol (COREG) 25 MG tablet Take 25 mg by mouth 2 (two) times daily. 03/07/19   [provider]  clopidogrel (PLAVIX) 75 MG tablet Take 75 mg by mouth daily. 03/28/19   [provider]  DULoxetine (CYMBALTA) 60 MG capsule Take 60 mg by mouth daily. 03/28/19   [provider]  furosemide (LASIX) 40 MG tablet Take 40 mg by mouth 2 (two) times daily. 12/03/20   [provider]  gabapentin  (NEURONTIN) 300 MG capsule Take 300 mg by mouth at bedtime. 12/29/20   [provider]  hydrALAZINE (APRESOLINE) 25 MG tablet Take 1 tablet (25 mg total) by mouth every 8 (eight) hours. 04/20/21   Shelly Coss, MD  JARDIANCE 10 MG TABS tablet Take 10 mg by mouth every morning. 02/25/21   [provider]  LANTUS SOLOSTAR 100 UNIT/ML Solostar Pen Inject 10 Units into the skin at bedtime. Hold if sugar < 100 04/11/21   Max Sane, MD  omeprazole (PRILOSEC) 20 MG capsule Take 20 mg by mouth daily at 12 noon.    [provider]  oxyCODONE-acetaminophen (PERCOCET/ROXICET) 5-325 MG tablet Take 1 tablet by mouth every 8 (eight) hours as needed for moderate pain. 04/20/21   Shelly Coss, MD  polyethylene glycol (MIRALAX / GLYCOLAX) 17 g packet Take 17 g by mouth daily. 04/21/21   Shelly Coss, MD  pravastatin (PRAVACHOL) 80 MG tablet Take 80 mg by mouth daily. 03/07/19   [provider]  predniSONE (DELTASONE) 10 MG tablet Take 1 tablet (10 mg total) by mouth daily with breakfast. 04/22/21   Shelly Coss, MD  senna (SENOKOT) 8.6 MG TABS tablet Take 1 tablet (8.6 mg total) by mouth 2 (two) times daily. 04/20/21   Shelly Coss, MD   ROS  Physical Exam  There were no vitals filed for this visit.  Lab Results  Component Value Date   CREATININE 2.21 (H) 04/20/2021   CREATININE 2.41 (H) 04/19/2021   CREATININE 2.63 (H) 04/18/2021    Vitals with BMI 04/20/2021 04/20/2021 04/20/2021  Height - - -  Weight - - -  BMI - - -  Systolic 550 016 429  Diastolic 46 44 50  Pulse 72 69 68     Assessment and Plan: Heart failure with preserved EF - NYHA - jardiance, norvasc, coreg - BNP 505.2   2. HTN - coreg, hydralazine - BMP reviewed from 04/20/21, Na 137, K 5.4, Cr 2.21, gfr 24 - PCP (Katrina Ortiz)    3. CKD IV - saw nephrology Katrina Ortiz) 04/29/21; follows up with [facility] (Katrina Ortiz) as well - on lokelma daily

## 2021-05-03 ENCOUNTER — Ambulatory Visit: Payer: HMO | Admitting: Family

## 2021-05-04 ENCOUNTER — Other Ambulatory Visit (HOSPITAL_COMMUNITY): Payer: Self-pay | Admitting: Orthopaedic Surgery

## 2021-05-04 ENCOUNTER — Other Ambulatory Visit: Payer: Self-pay | Admitting: Orthopaedic Surgery

## 2021-05-04 DIAGNOSIS — M25432 Effusion, left wrist: Secondary | ICD-10-CM

## 2021-05-10 NOTE — Progress Notes (Signed)
? Patient ID: Katrina Ortiz, female    DOB: December 21, 1955, 66 y.o.   MRN: [id number] ? ?HPI ? ?Katrina Ortiz is a 66 y/o female with a history of CAD (stent), DM, HTN, CKD, anemia, DISH, GERD, hyperkalemia, psoriasis, previous tobacco use and chronic heart failure.  ? ?Echo report from 04/15/21 reviewed and showed an EF of 60-65% along with mild MR. ? ?Admitted 04/14/21 due to nausea, shortness of breath, generalized weakness and also complains of pain and redness of the left wrist and the right antecubital area. Right upper extremity negative for DVT. Bilateral pleural effusions noted. Antibiotics started due to wrist cellulitis. Cardiology and orthopaedics consults obtained. Initially given IV lasix with transition to oral diuretics. Lokelma given for hyperkalemia. OT/PT evaluations done. Discharged after 6 days to SNF. Admitted 04/09/21 due to hypoxia and hypoglycemia at home. She was found to have AKI. Nephrology and GI consults obtained. IVF given for AKI. Discharged after 4 days.   ? ?She presents today for her initial visit with a chief complaint of abdominal distention. She says this has been chronic in nature although says that it's worsened since her diuretic was decreased. She has associated pedal edema and left wrist pain along with this. She denies any difficulty sleeping, dizziness, palpitations, chest pain, shortness of breath, cough or fatigue.  ? ?Says that she's not being weighed daily. Has seen nephrology recently and is taking lokelma daily due to elevated potassium  ? ?Past Medical History:  ?Diagnosis Date  ? Anemia   ? CHF (congestive heart failure) ([place])   ? Chronic kidney disease   ? Coronary artery disease   ? Diabetes mellitus without complication ([place])   ? DISH (diffuse idiopathic skeletal hyperostosis)   ? GERD (gastroesophageal reflux disease)   ? Hyperkalemia   ? Hypertension   ? Psoriasis   ? Psoriasis   ? ?Past Surgical History:  ?Procedure Laterality Date  ? CESAREAN SECTION    ? 2  c-sections  ? CORONARY ARTERY BYPASS GRAFT    ? CORONARY STENT PLACEMENT    ? HERNIA REPAIR    ? TUBAL LIGATION    ? ?History reviewed. No pertinent family history. ?Social History  ? ?Tobacco Use  ? Smoking status: Former  ?  Types: Cigarettes  ?  Quit date: 11/22/2002  ?  Years since quitting: 18.4  ? Smokeless tobacco: Never  ?Substance Use Topics  ? Alcohol use: No  ? ?No Known Allergies ?Prior to Admission medications   ?Medication Sig Start Date End Date Taking? Authorizing Provider  ?carvedilol (COREG) 25 MG tablet Take 25 mg by mouth 2 (two) times daily. 03/07/19  Yes [provider]  ?clopidogrel (PLAVIX) 75 MG tablet Take 75 mg by mouth daily. 03/28/19  Yes [provider]  ?colchicine 0.6 MG tablet Take 0.6 mg by mouth 3 (three) times a week. On M, W, F   Yes [provider]  ?Dulaglutide (TRULICITY) 7.89 FY/1.0FB SOPN Inject 0.75 mg into the skin once a week. On Tuesday   Yes [provider]  ?DULoxetine (CYMBALTA) 60 MG capsule Take 60 mg by mouth daily. 03/28/19  Yes [provider]  ?furosemide (LASIX) 20 MG tablet Take 20 mg by mouth 2 (two) times daily. 12/03/20  Yes [provider]  ?gabapentin (NEURONTIN) 300 MG capsule Take 300 mg by mouth at bedtime. 12/29/20  Yes [provider]  ?hydrALAZINE (APRESOLINE) 25 MG tablet Take 1 tablet (25 mg total) by mouth every 8 (eight) hours. 04/20/21  Yes Shelly Coss, MD  ?insulin lispro (HUMALOG) 100 UNIT/ML injection Inject into the skin 3 (three) times daily before meals. SSI   Yes [provider]  ?LANTUS SOLOSTAR 100 UNIT/ML Solostar Pen Inject 10 Units into the skin at bedtime. Hold if sugar < 100 ?Patient taking differently: Inject 7 Units into the skin 2 (two) times daily. Hold if sugar < 100 04/11/21  Yes Max Sane, MD  ?leflunomide (ARAVA) 10 MG tablet Take 10 mg by mouth daily.   Yes [provider]  ?lisinopril (ZESTRIL) 5 MG tablet Take 5 mg by mouth daily.   Yes  [provider]  ?omeprazole (PRILOSEC) 20 MG capsule Take 20 mg by mouth daily at 12 noon.   Yes [provider]  ?oxyCODONE-acetaminophen (PERCOCET/ROXICET) 5-325 MG tablet Take 1 tablet by mouth every 8 (eight) hours as needed for moderate pain. 04/20/21  Yes Shelly Coss, MD  ?polyethylene glycol (MIRALAX / GLYCOLAX) 17 g packet Take 17 g by mouth daily. 04/21/21  Yes Shelly Coss, MD  ?pravastatin (PRAVACHOL) 80 MG tablet Take 80 mg by mouth daily. 03/07/19  Yes [provider]  ?predniSONE (DELTASONE) 10 MG tablet Take 1 tablet (10 mg total) by mouth daily with breakfast. 04/22/21  Yes Shelly Coss, MD  ?sodium zirconium cyclosilicate (LOKELMA) 10 g PACK packet Take 10 g by mouth daily.   Yes [provider]  ? ? ?Review of Systems  ?Constitutional:  Negative for appetite change and fatigue.  ?HENT:  Negative for congestion, postnasal drip and sore throat.   ?Eyes: Negative.   ?Respiratory:  Negative for cough, chest tightness and shortness of breath.   ?Cardiovascular:  Positive for leg swelling. Negative for chest pain and palpitations.  ?Gastrointestinal:  Positive for abdominal distention. Negative for abdominal pain.  ?Endocrine: Negative.   ?Genitourinary: Negative.   ?Musculoskeletal:  Positive for arthralgias (left wrist).  ?Skin: Negative.   ?Allergic/Immunologic: Negative.   ?Neurological:  Negative for dizziness and light-headedness.  ?Hematological:  Negative for adenopathy. Does not bruise/bleed easily.  ?Psychiatric/Behavioral:  Negative for dysphoric mood and sleep disturbance (sleeping on 2 pillows). The patient is not nervous/anxious.   ? ?Vitals:  ? 05/12/21 1354  ?BP: (!) 158/64  ?Pulse: 79  ?Resp: 18  ?SpO2: 98%  ?Weight: 218 lb 6 oz (99.1 kg)  ?Height: 5\' 3"  (1.6 m)  ? ?Wt Readings from Last 3 Encounters:  ?05/12/21 218 lb 6 oz (99.1 kg)  ?04/20/21 211 lb 13.8 oz (96.1 kg)  ?04/09/21 208 lb (94.3 kg)  ? ?Lab Results  ?Component Value Date  ?  CREATININE 2.21 (H) 04/20/2021  ? CREATININE 2.41 (H) 04/19/2021  ? CREATININE 2.63 (H) 04/18/2021  ? ?Physical Exam ?Constitutional:   ?   Appearance: Normal appearance.  ?HENT:  ?   Head: Normocephalic and atraumatic.  ?Cardiovascular:  ?   Rate and Rhythm: Normal rate and regular rhythm.  ?Pulmonary:  ?   Effort: Pulmonary effort is normal. No respiratory distress.  ?   Breath sounds: No wheezing or rales.  ?Abdominal:  ?   General: There is distension.  ?   Palpations: Abdomen is soft.  ?Musculoskeletal:     ?   General: No tenderness.  ?   Cervical back: Normal range of motion and neck supple.  ?   Right lower leg: Edema (trace pitting) present.  ?   Left lower leg: Edema (trace pitting) present.  ?Skin: ?   General: Skin is warm and dry.  ?Neurological:  ?  General: No focal deficit present.  ?   Mental Status: She is alert and oriented to person, place, and time.  ?Psychiatric:     ?   Mood and Affect: Mood normal.     ?   Behavior: Behavior normal.     ?   Thought Content: Thought content normal.  ? ?Assessment & Plan: ? ?1: Chronic heart failure with preserved ejection fraction without structural changes- ?- NYHA class I ?- euvolemic today ?- not being weighed daily at [facility]; weight log shows weight 215-220 pounds; order written for her to be weighed daily and to call for an overnight weight gain of > 2 pounds or a weekly weight gain of > 5 pounds ?- not adding salt to her food at home or at the facility ?- currently working with PT ?- order written for compression socks to be put on every morning with removal at bedtime ?- BNP 04/13/21 was 505.2 ? ?2: HTN with CKD- ?- BP mildly elevated (158/64) ?- saw nephrology ([place]) 05/05/21 ?- BMP 05/05/21 reviewed and showed sodium 137, potassium 6.0, creatinine 2.24 & GFR 24; currently receiving daily lokelma  ? ?3: DM- ?- saw PCP ([place]) 01/27/21; now currently seeing PCP at [facility] ?- A1c 04/14/21 was 7.5% ?- glucose 284 2 days ago ? ?4: Anemia- ?- Hg  05/05/21 was 8.8 ? ?5: Hyperkalemia- ?- being followed by  nephrology and getting daily lokelma ?- renal function will limit diuretic titration ? ? ?Facility medication list reviewed.  ? ?Return in 2 months, sooner if needed.

## 2021-05-12 ENCOUNTER — Encounter: Payer: Self-pay | Admitting: Family

## 2021-05-12 ENCOUNTER — Ambulatory Visit: Payer: HMO | Attending: Family | Admitting: Family

## 2021-05-12 ENCOUNTER — Other Ambulatory Visit: Payer: Self-pay

## 2021-05-12 VITALS — BP 158/64 | HR 79 | Resp 18 | Ht 63.0 in | Wt 218.4 lb

## 2021-05-12 DIAGNOSIS — K219 Gastro-esophageal reflux disease without esophagitis: Secondary | ICD-10-CM | POA: Insufficient documentation

## 2021-05-12 DIAGNOSIS — N184 Chronic kidney disease, stage 4 (severe): Secondary | ICD-10-CM | POA: Diagnosis not present

## 2021-05-12 DIAGNOSIS — Z955 Presence of coronary angioplasty implant and graft: Secondary | ICD-10-CM | POA: Insufficient documentation

## 2021-05-12 DIAGNOSIS — I130 Hypertensive heart and chronic kidney disease with heart failure and stage 1 through stage 4 chronic kidney disease, or unspecified chronic kidney disease: Secondary | ICD-10-CM | POA: Insufficient documentation

## 2021-05-12 DIAGNOSIS — I2510 Atherosclerotic heart disease of native coronary artery without angina pectoris: Secondary | ICD-10-CM | POA: Diagnosis not present

## 2021-05-12 DIAGNOSIS — N189 Chronic kidney disease, unspecified: Secondary | ICD-10-CM | POA: Diagnosis not present

## 2021-05-12 DIAGNOSIS — I10 Essential (primary) hypertension: Secondary | ICD-10-CM

## 2021-05-12 DIAGNOSIS — D631 Anemia in chronic kidney disease: Secondary | ICD-10-CM | POA: Diagnosis not present

## 2021-05-12 DIAGNOSIS — I340 Nonrheumatic mitral (valve) insufficiency: Secondary | ICD-10-CM | POA: Insufficient documentation

## 2021-05-12 DIAGNOSIS — E1122 Type 2 diabetes mellitus with diabetic chronic kidney disease: Secondary | ICD-10-CM | POA: Diagnosis not present

## 2021-05-12 DIAGNOSIS — Z79899 Other long term (current) drug therapy: Secondary | ICD-10-CM | POA: Diagnosis not present

## 2021-05-12 DIAGNOSIS — M4819 Ankylosing hyperostosis [Forestier], multiple sites in spine: Secondary | ICD-10-CM | POA: Diagnosis not present

## 2021-05-12 DIAGNOSIS — L409 Psoriasis, unspecified: Secondary | ICD-10-CM | POA: Diagnosis not present

## 2021-05-12 DIAGNOSIS — E875 Hyperkalemia: Secondary | ICD-10-CM | POA: Diagnosis not present

## 2021-05-12 DIAGNOSIS — I5032 Chronic diastolic (congestive) heart failure: Secondary | ICD-10-CM

## 2021-05-12 NOTE — Patient Instructions (Signed)
Start weighing daily and call for an overnight weight gain of 3 pounds or more or a weekly weight gain of more than 5 pounds. ? ?

## 2021-05-24 ENCOUNTER — Ambulatory Visit: Admission: RE | Admit: 2021-05-24 | Payer: HMO | Source: Ambulatory Visit

## 2021-07-11 NOTE — Progress Notes (Deleted)
Patient ID: Katrina Ortiz, female    DOB: 1955/09/10, 66 y.o.   MRN: [id number]  HPI  Katrina Ortiz is a 65 y/o female with a history of CAD (stent), DM, HTN, CKD, anemia, DISH, GERD, hyperkalemia, psoriasis, previous tobacco use and chronic heart failure.   Echo report from 04/15/21 reviewed and showed an EF of 60-65% along with mild MR.  Admitted 05/27/21 due to 05/27/21 due to hypertensive urgency with substernal chest pain with radiation. Renal duplex US while in patient without evidence of renal artery stenosis. Elevated troponin thought to be due to demand ischemia. HTN medications adjusted. Discharged after 7 days. Admitted 04/14/21 due to nausea, shortness of breath, generalized weakness and also complains of pain and redness of the left wrist and the right antecubital area. Right upper extremity negative for DVT. Bilateral pleural effusions noted. Antibiotics started due to wrist cellulitis. Cardiology and orthopaedics consults obtained. Initially given IV lasix with transition to oral diuretics. Lokelma given for hyperkalemia. OT/PT evaluations done. Discharged after 6 days to SNF. Admitted 04/09/21 due to hypoxia and hypoglycemia at home. She was found to have AKI. Nephrology and GI consults obtained. IVF given for AKI. Discharged after 4 days.    She presents today for a follow-up visit with a chief complaint of   Past Medical History:  Diagnosis Date   Anemia    CHF (congestive heart failure) ([place])    Chronic kidney disease    Coronary artery disease    Diabetes mellitus without complication (HCC)    DISH (diffuse idiopathic skeletal hyperostosis)    GERD (gastroesophageal reflux disease)    Hyperkalemia    Hypertension    Psoriasis    Psoriasis    Past Surgical History:  Procedure Laterality Date   CESAREAN SECTION     2 c-sections   CORONARY ARTERY BYPASS GRAFT     CORONARY STENT PLACEMENT     HERNIA REPAIR     TUBAL LIGATION     No family history on file. Social History    Tobacco Use   Smoking status: Former    Types: Cigarettes    Quit date: 11/22/2002    Years since quitting: 18.6   Smokeless tobacco: Never  Substance Use Topics   Alcohol use: No   No Known Allergies   Review of Systems  Constitutional:  Negative for appetite change and fatigue.  HENT:  Negative for congestion, postnasal drip and sore throat.   Eyes: Negative.   Respiratory:  Negative for cough, chest tightness and shortness of breath.   Cardiovascular:  Positive for leg swelling. Negative for chest pain and palpitations.  Gastrointestinal:  Positive for abdominal distention. Negative for abdominal pain.  Endocrine: Negative.   Genitourinary: Negative.   Musculoskeletal:  Positive for arthralgias (left wrist).  Skin: Negative.   Allergic/Immunologic: Negative.   Neurological:  Negative for dizziness and light-headedness.  Hematological:  Negative for adenopathy. Does not bruise/bleed easily.  Psychiatric/Behavioral:  Negative for dysphoric mood and sleep disturbance (sleeping on 2 pillows). The patient is not nervous/anxious.      Physical Exam Constitutional:      Appearance: Normal appearance.  HENT:     Head: Normocephalic and atraumatic.  Cardiovascular:     Rate and Rhythm: Normal rate and regular rhythm.  Pulmonary:     Effort: Pulmonary effort is normal. No respiratory distress.     Breath sounds: No wheezing or rales.  Abdominal:     General: There is distension.  Palpations: Abdomen is soft.  Musculoskeletal:        General: No tenderness.     Cervical back: Normal range of motion and neck supple.     Right lower leg: Edema (trace pitting) present.     Left lower leg: Edema (trace pitting) present.  Skin:    General: Skin is warm and dry.  Neurological:     General: No focal deficit present.     Mental Status: She is alert and oriented to person, place, and time.  Psychiatric:        Mood and Affect: Mood normal.        Behavior: Behavior normal.         Thought Content: Thought content normal.   Assessment & Plan:  1: Chronic heart failure with preserved ejection fraction without structural changes- - NYHA class I - euvolemic today - getting weighed daily; reminded to call for an overnight weight gain of > 2 pounds or a weekly weight gain of > 5 pounds - weight 218.6 from last visit here 2 months ago - not adding salt to her food at home or at the facility - currently working with PT - BNP 05/27/21 was 423  2: HTN with CKD- - BP  - saw nephrology ([place]) 05/05/21 - BMP 06/16/21 reviewed and showed sodium 138, potassium 5.7, creatinine 2.77 & GFR 18   3: DM- - saw PCP ([place]) 07/06/21 - A1c 04/14/21 was 7.5% - glucose   4: Anemia- - Hg 05/30/21 was 8.7  5: Hyperkalemia- - being followed by  nephrology and getting daily lokelma - renal function will limit diuretic titration   Facility medication list reviewed.

## 2021-07-12 ENCOUNTER — Ambulatory Visit: Payer: HMO | Admitting: Family

## 2021-07-14 ENCOUNTER — Ambulatory Visit: Payer: HMO | Admitting: Family

## 2023-06-02 ENCOUNTER — Encounter: Payer: Self-pay | Admitting: Emergency Medicine

## 2023-06-02 ENCOUNTER — Other Ambulatory Visit: Payer: Self-pay

## 2023-06-02 ENCOUNTER — Emergency Department

## 2023-06-02 ENCOUNTER — Emergency Department
Admission: EM | Admit: 2023-06-02 | Discharge: 2023-06-02 | Disposition: A | Discharge status: Home / Self Care | Attending: Emergency Medicine | Admitting: Emergency Medicine

## 2023-06-02 DIAGNOSIS — I509 Heart failure, unspecified: Secondary | ICD-10-CM | POA: Diagnosis not present

## 2023-06-02 DIAGNOSIS — R1084 Generalized abdominal pain: Secondary | ICD-10-CM | POA: Insufficient documentation

## 2023-06-02 DIAGNOSIS — K5900 Constipation, unspecified: Secondary | ICD-10-CM | POA: Diagnosis present

## 2023-06-02 DIAGNOSIS — J159 Unspecified bacterial pneumonia: Secondary | ICD-10-CM | POA: Insufficient documentation

## 2023-06-02 DIAGNOSIS — R531 Weakness: Secondary | ICD-10-CM | POA: Diagnosis not present

## 2023-06-02 DIAGNOSIS — R110 Nausea: Secondary | ICD-10-CM | POA: Diagnosis not present

## 2023-06-02 DIAGNOSIS — Z992 Dependence on renal dialysis: Secondary | ICD-10-CM | POA: Diagnosis not present

## 2023-06-02 DIAGNOSIS — I132 Hypertensive heart and chronic kidney disease with heart failure and with stage 5 chronic kidney disease, or end stage renal disease: Secondary | ICD-10-CM | POA: Diagnosis not present

## 2023-06-02 DIAGNOSIS — N186 End stage renal disease: Secondary | ICD-10-CM | POA: Insufficient documentation

## 2023-06-02 LAB — BASIC METABOLIC PANEL WITH GFR
Anion gap: 13 (ref 5–15)
BUN: 55 mg/dL — ABNORMAL HIGH (ref 8–23)
CO2: 25 mmol/L (ref 22–32)
Calcium: 8.5 mg/dL — ABNORMAL LOW (ref 8.9–10.3)
Chloride: 94 mmol/L — ABNORMAL LOW (ref 98–111)
Creatinine, Ser: 1.85 mg/dL — ABNORMAL HIGH (ref 0.44–1.00)
GFR, Estimated: 29 mL/min — ABNORMAL LOW (ref >60)
Glucose, Bld: 404 mg/dL — ABNORMAL HIGH (ref 70–99)
Potassium: 3.7 mmol/L (ref 3.5–5.1)
Sodium: 132 mmol/L — ABNORMAL LOW (ref 135–145)

## 2023-06-02 LAB — CBC
HCT: 24.7 % — ABNORMAL LOW (ref 36.0–46.0)
Hemoglobin: 8 g/dL — ABNORMAL LOW (ref 12.0–15.0)
MCH: 30.5 pg (ref 26.0–34.0)
MCHC: 32.4 g/dL (ref 30.0–36.0)
MCV: 94.3 fL (ref 80.0–100.0)
Platelets: 317 10*3/uL (ref 150–400)
RBC: 2.62 MIL/uL — ABNORMAL LOW (ref 3.87–5.11)
RDW: 14.4 % (ref 11.5–15.5)
WBC: 12.9 10*3/uL — ABNORMAL HIGH (ref 4.0–10.5)
nRBC: 0 % (ref 0.0–0.2)

## 2023-06-02 MED ORDER — ONDANSETRON HCL 4 MG/2ML IJ SOLN
4.0000 mg | Freq: Once | INTRAMUSCULAR | Status: AC
  Administered 2023-06-02: 4 mg via INTRAVENOUS
  Filled 2023-06-02: qty 2

## 2023-06-02 MED ORDER — MAGNESIUM CITRATE PO SOLN: 1.0000 | Freq: Once | ORAL | 0 refills | Status: AC

## 2023-06-02 MED ORDER — ACETAMINOPHEN 500 MG PO TABS
1000.0000 mg | ORAL_TABLET | Freq: Once | ORAL | Status: AC
  Administered 2023-06-02: 1000 mg via ORAL
  Filled 2023-06-02: qty 2

## 2023-06-02 MED ORDER — AMOXICILLIN-POT CLAVULANATE 875-125 MG PO TABS
1.0000 | ORAL_TABLET | Freq: Once | ORAL | Status: AC
  Administered 2023-06-02: 1 via ORAL
  Filled 2023-06-02: qty 1

## 2023-06-02 MED ORDER — AMOXICILLIN-POT CLAVULANATE 875-125 MG PO TABS: 1.0000 | ORAL_TABLET | Freq: Two times a day (BID) | ORAL | 0 refills | Status: AC

## 2023-06-02 MED ORDER — SODIUM CHLORIDE 0.9 % IV BOLUS
1000.0000 mL | Freq: Once | INTRAVENOUS | Status: AC
  Administered 2023-06-02: 1000 mL via INTRAVENOUS

## 2023-06-02 MED ORDER — SENNOSIDES-DOCUSATE SODIUM 8.6-50 MG PO TABS
1.0000 | ORAL_TABLET | Freq: Once | ORAL | Status: AC
  Administered 2023-06-02: 1 via ORAL
  Filled 2023-06-02: qty 1

## 2023-06-02 MED ORDER — SENNOSIDES-DOCUSATE SODIUM 8.6-50 MG PO TABS: 1.0000 | ORAL_TABLET | Freq: Every day | ORAL | 0 refills | Status: DC

## 2023-06-02 NOTE — Discharge Instructions (Signed)
 I have sent printed scripts back with her that need to be filled to help treat her constipation as well as treat a potential pneumonia that was seen today.  Please have her follow-up with her PCP.

## 2023-06-02 NOTE — ED Provider Notes (Signed)
 [facility] Provider Note    Event Date/Time   First MD Initiated Contact with Patient 06/02/23 1931     (approximate)   History   Nausea and Weakness   HPI Katrina Ortiz is a 68 y.o. female with history of ESRD on dialysis, HFpEF, HTN presenting today for nausea and weakness.  Patient states she has chronic abdominal pain and nausea symptoms.  She had her first dialysis session today and afterwards felt more fatigued with worsening nausea.  Generalized abdominal pain but also has chronic history of this.  Occasional diarrhea recently.  Essentially does not make any urine at all at this point and denies any dysuria symptoms recently.  No fevers or chills, no chest pain or shortness of breath.     Physical Exam   Triage Vital Signs: ED Triage Vitals  Encounter Vitals Group     BP 06/02/23 1759 (!) 109/44     Systolic BP Percentile --      Diastolic BP Percentile --      Pulse Rate 06/02/23 1759 93     Resp 06/02/23 1759 20     Temp 06/02/23 1759 98.4 F (36.9 C)     Temp Source 06/02/23 1759 Oral     SpO2 06/02/23 1759 100 %     Weight 06/02/23 1801 218 lb 7.6 oz (99.1 kg)     Height --      Head Circumference --      Peak Flow --      Pain Score --      Pain Loc --      Pain Education --      Exclude from Growth Chart --     Most recent vital signs: Vitals:   06/02/23 1759  BP: (!) 109/44  Pulse: 93  Resp: 20  Temp: 98.4 F (36.9 C)  SpO2: 100%   Physical Exam: I have reviewed the vital signs and nursing notes. General: Awake, alert, no acute distress.  Nontoxic appearing. Head:  Atraumatic, normocephalic.   ENT:  EOM intact, PERRL. Oral mucosa is pink and moist with no lesions. Neck: Neck is supple with full range of motion, No meningeal signs. Cardiovascular:  RRR, No murmurs. Peripheral pulses palpable and equal bilaterally. Respiratory:  Symmetrical chest wall expansion.  No rhonchi, rales, or wheezes.  Good air movement  throughout.  No use of accessory muscles.   Musculoskeletal:  No cyanosis or edema. Moving extremities with full ROM Abdomen:  Soft, generalized tenderness palpation throughout the abdomen, nondistended. Neuro:  GCS 15, moving all four extremities, interacting appropriately. Speech clear. Psych:  Calm, appropriate.   Skin:  Warm, dry, no rash.    ED Results / Procedures / Treatments   Labs (all labs ordered are listed, but only abnormal results are displayed) Labs Reviewed  BASIC METABOLIC PANEL WITH GFR - Abnormal; Notable for the following components:      Result Value   Sodium 132 (*)    Chloride 94 (*)    Glucose, Bld 404 (*)    BUN 55 (*)    Creatinine, Ser 1.85 (*)    Calcium 8.5 (*)    GFR, Estimated 29 (*)    All other components within normal limits  CBC - Abnormal; Notable for the following components:   WBC 12.9 (*)    RBC 2.62 (*)    Hemoglobin 8.0 (*)    HCT 24.7 (*)    All other components within normal limits  URINALYSIS,  ROUTINE W REFLEX MICROSCOPIC  CBG MONITORING, ED     EKG My EKG interpretation: Rate of 83, normal sinus rhythm, normal axis, normal intervals.  No acute ST elevations or depressions   RADIOLOGY Independently interpreted CT abdomen/pelvis with no acute intra-abdominal pathology but constipation seen.  Questionable right-sided pneumonia   PROCEDURES:  Critical Care performed: No  Procedures   MEDICATIONS ORDERED IN ED: Medications  amoxicillin-clavulanate (AUGMENTIN) 875-125 MG per tablet 1 tablet (has no administration in time range)  senna-docusate (Senokot-S) tablet 1 tablet (has no administration in time range)  ondansetron (ZOFRAN) injection 4 mg (4 mg Intravenous Given 06/02/23 2001)  sodium chloride 0.9 % bolus 1,000 mL (1,000 mLs Intravenous New Bag/Given 06/02/23 2003)  acetaminophen (TYLENOL) tablet 1,000 mg (1,000 mg Oral Given 06/02/23 1959)     IMPRESSION / MDM / ASSESSMENT AND PLAN / ED COURSE  I reviewed the  triage vital signs and the nursing notes.                              Differential diagnosis includes, but is not limited to, SBO, colitis, enteritis, dialysis related fatigue, dehydration  Patient's presentation is most consistent with acute complicated illness / injury requiring diagnostic workup.  Patient is a 68 year old female with history of chronic abdominal pain and nausea presenting today for worsening of those symptoms with weakness.  Vital signs on arrival are stable.  Just had dialysis today which may explain why she is weaker than her normal as it was her first episode.  EKG reassuring.  Laboratory workup with CBC and BMP are largely consistent with her baseline.  No acute immediate pathology seen on that.  Given she has generalized tenderness palpation throughout the abdomen which she feels is worse than her baseline, will get CT abdomen/pelvis for further evaluation.  She was given 1 L of fluids, Zofran, and Tylenol as she did not want opioids initially.  CT abdomen/pelvis shows no acute intra-abdominal infections or blockages.  There is notable constipation for which we will start her on a bowel regimen including Peri-Colace and mag citrate.  There was 1 small spot of possible pneumonia on the right side.  She is not having any significant cough or congestion symptoms or any shortness of breath from her baseline.  However, given her comorbidities will empirically treat with Augmentin.  She is otherwise stable for discharge back to her facility.  Given strict return precautions.  The patient is on the cardiac monitor to evaluate for evidence of arrhythmia and/or significant heart rate changes.     FINAL CLINICAL IMPRESSION(S) / ED DIAGNOSES   Final diagnoses:  Constipation, unspecified constipation type  Bacterial pneumonia     Rx / DC Orders   ED Discharge Orders          Ordered    amoxicillin-clavulanate (AUGMENTIN) 875-125 MG tablet  2 times daily        06/02/23 2118     magnesium citrate SOLN   Once        06/02/23 2118    senna-docusate (SENOKOT-S) 8.6-50 MG tablet  Daily        06/02/23 2118             Note:  This document was prepared using Dragon voice recognition software and may include unintentional dictation errors.   Janith Lima, MD 06/02/23 2120

## 2023-06-02 NOTE — ED Triage Notes (Signed)
 Pt in from [facility] via [facility]  with nausea and weakness - baseline WC bound. Pt states she had her first ever dialysis trx this morning through R chest permacath. Reports feeling constant nausea, and "just not right" since. Pt got 0.5mg  of Ativan from [facility] staff PTA.  CBG 394 140/72 HR90 97%2LNC  RR22

## 2023-08-10 NOTE — Unmapped External Note (Addendum)
 [facility] - Operative Note   Post-Procedure Note  Procedure Name: Right internal jugular vein tunneled dialysis catheter exchange and central venogram.  Pre-Op Diagnosis: ESRD patient with malfunctioning right internal jugular vein tunneled hemodialysis catheter  Post-Op Diagnosis: Same as pre-operative diagnosis  Attending Physician:  Roetta Gatha Said, MD  Time out: Prior to the procedure, a time out was performed with all team members present. During the time out, the patient, procedure and procedure site when applicable were verbally verified by the team members and Roetta Gatha Said, MD.  Description of procedure: Successful exchange of right internal jugular vein tunneled hemodialysis catheter with a new 19 cm glidepath 14.5 French hemodialysis catheter with its tip in the right atrium.  Central venogram was performed and demonstrated no fibrin sheath in the right innominate vein and superior vena cava.  Plan: 1.  Okay to use the hemodialysis catheter immediately for hemodialysis. 2. Erythema noted at the exit site at the site of the previous suture. Apply antibiotic ointment dressing after each dialysis for the next 3 to 4 weeks until the erythema resolves.  Sedation:Moderate sedation  Estimated Blood Loss: approximately less than 10 mL Complications: None  See detailed procedure note with images in PACS [facility]).  The patient tolerated the procedure well without incident or complication and left the room in stable condition.  Roetta Gatha Said, MD 08/10/2023 9:28 AM

## 2023-08-10 NOTE — H&P (Addendum)
 [facility]- Pre Procedure H/P Patient name: Katrina Ortiz CSN: [id number] MRN: [id number] Date of Procedure: 08/10/2023  Assessment/Plan:   Katrina Ortiz is a 68 y.o. female who will undergo Tunneled dialysis catheter exchange with possible angioplasty of fibrin sheath under moderate sedation and intervention nephrology at [facility] endovascular center.   Consent obtained in the Pre Op holding area by Dr. Lucienne.  Risks, benefits, and alternatives including but not limited to risks like bleeding, blood clots, infection, embolism, thromboembolism, pain, vascular injury were discussed with patient/patient's representative. All questions were answered to patient/patient's representative satisfaction.  Patient/Patient's representative consents and would like to proceed with the procedure.  --The patient will accept blood products in an emergent situation. --The patient has a Do Not Resuscitate order in effect, which will be suspended during the procedure.   HPI: Katrina Ortiz is a 68 y.o. female with ESRD on intermittent hemodialysis via right IJ tunneled hemodialysis catheter comes in for an exchange of the tunneled dialysis catheter due to malfunctioning catheter.  Patient denies any other complaints today.  Past Medical History[1]  Past Surgical History[2]   Allergies:  Allergies[3]  Medications:  No relevant medications, please see full medication list in Epic.  ASA Grade: ASA 3 - Patient with moderate systemic disease with functional limitations  PE:   Vitals:   08/10/23 0817  BP: 154/67  Pulse: 71  Resp: 14  Temp: 36.4 C (97.5 F)  SpO2: 100%   General: female in NAD. Airway assessment: Class 3 - Can visualize soft palate Lungs: Respirations nonlabored.  Right IJ tunneled dialysis catheter in place.         [1] Past Medical History: Diagnosis Date  . Complicated UTI (urinary tract infection) 07/24/2016   Klebsiella sepsis/lower extremity  cellulitis (hospitalized 6-3-> 08-04-2016)  . Coronary artery disease    last cath 9-05-irreg RCA  . Coronary artery disease involving native coronary artery of native heart 05/26/2021   Last Assessment & Plan: Formatting of this note might be different from the original. Continue aspirin  and statins Troponins negative, EKG nonacute and no complaints of chest pain  . COVID 10/05/2020  . Diabetes mellitus      . DISH (diffuse idiopathic skeletal hyperostosis)    bilateral hips/spine  . GERD (gastroesophageal reflux disease)   . Klebsiella sepsis      . Left fibular fracture 01/2010  . Menopause 2011  . Obesity   . Personal history of colonic polyps 07/20/2006   colonoscopy-sigmoid polyps/lipoma  . Psoriasis   . Status post insertion of drug eluting coronary artery stent 07/15/2002   left main coronary artery  [2] Past Surgical History: Procedure Laterality Date  . bilateral tubal ligation    . CATARACT EXTRACTION Right 10/29/2019  . CESAREAN SECTION  x2  . CORONARY ARTERY BYPASS GRAFT  2003   2V-LIMA->LAD, Radial->OM  . PR ADJ TISS XFER TRUNK <10 SQCM Right 08/21/2018   Procedure: Adjacent Tiss Transf Trunk; Defect 10 Sq Cm/Less;  Surgeon: Garnette Lonni Craven, DPM;  Location: [facility];  Service: Vascular  . PR AMPUTATION TOE,I-P JT Left 07/27/2017   Procedure: AMPUTATION, TOE; INTERPHALANGEAL JOINT;  Surgeon: Garnette Lonni Craven, DPM;  Location: MAIN OR [facility];  Service: Vascular  . PR ANASTOMOSIS,AV,ANY SITE Right 07/21/2022   Procedure: ARTERIOVENOUS ANASTOMOSIS, OPEN; DIRECT, ANY SITE, UPPER EXTREMITY;  Surgeon: Marchelle Kitchens, MD;  [facility] OR [facility];  Service: General Surgery  . PR FUSION BIG TOE,MT-P JT Right 08/21/2018   Procedure: PRIORITY Right  MPJ fusion;  Surgeon: Garnette Lonni Craven, DPM;  Location: MAIN OR [facility];  Service: Vascular  . PR INCISION & DRAINAGE ABSCESS COMPLICATED/MULTIPLE Right 07/27/2017   Procedure: Incision & Drainage Of  Abscess; Complicated right foot with necrosis of soft tissue 1st MPJ;  Surgeon: Garnette Lonni Craven, DPM;  Location: MAIN OR [facility];  Service: Vascular  . PR PART REMV BONE METATARSAL HEAD,EA Left 09/05/2017   Procedure: Ostectomy Partial Exostectomy Or Condylectomy, Metatarasal Head,  Ea Metatarsal Head;  Surgeon: Garnette Lonni Craven, DPM;  Location: MAIN OR [facility];  Service: Vascular  . PR PERQ DRAINAGE PLEURA INSERT CATH W/IMAGING N/A 03/17/2023   Procedure: PLEURAL DRAINAGE, PERC, W INSERTION OF INDWELLING CATHETER; W IMAGING GUIDANCE;  Surgeon: Fontaine Selinda Beam, MD;  Location: BRONCH PROCEDURE LAB [facility];  Service: Pulmonary  . PR REMOV SESAMOID BONE,1ST TOE Right 09/05/2017   Procedure: Right foot sesamoidectomy;  Surgeon: Garnette Lonni Craven, DPM;  Location: MAIN OR [facility];  Service: Vascular  . PR REPAIR OF HAMMERTOE,ONE Right 08/21/2018   Procedure: Correction Hammertoe Hallux;  Surgeon: Garnette Lonni Craven, DPM;  Location: MAIN OR [facility];  Service: Vascular  . PR TEMPORAL ARTERY LIGATN OR BX Left 10/21/2021   Procedure: LIGATION OR BIOPSY, TEMPORAL ARTERY;  Surgeon: Franky Alm Daring, MD;  Location: MAIN OR [facility];  Service: ENT  . PR UPPER GI ENDOSCOPY,BIOPSY N/A 03/22/2023   Procedure: UGI ENDOSCOPY; WITH BIOPSY, SINGLE OR MULTIPLE;  Surgeon: Georgine Arlean Min, MD;  Location: OR [facility] [place] [facility];  Service: Gastroenterology  . PR XCAPSL CTRC RMVL INSJ IO LENS PROSTH W/O ECP Right 10/29/2019   Procedure: EXTRACAPSULAR CATARACT REMOVAL W/INSERTION OF INTRAOCULAR LENS PROSTHESIS, MANUAL OR MECHANICAL TECHNIQUE WITHOUT ENDOSCOPIC CYCLOPHOTOCOAGULATION;  Surgeon: Bernerd Rhina Silversmith, MD;  Location: [facility] OR [facility];  Service: Ophthalmology  . PR XCAPSL CTRC RMVL INSJ IO LENS PROSTH W/O ECP Left 12/17/2019   Procedure: EXTRACAPSULAR CATARACT REMOVAL W/INSERTION OF INTRAOCULAR LENS PROSTHESIS, MANUAL OR MECHANICAL TECHNIQUE WITHOUT ENDOSCOPIC CYCLOPHOTOCOAGULATION;  Surgeon:  Bernerd Rhina Silversmith, MD;  Location: [facility] OR [facility];  Service: Ophthalmology  . SKIN BIOPSY    [3] No Known Allergies

## 2023-08-28 ENCOUNTER — Emergency Department

## 2023-08-28 ENCOUNTER — Inpatient Hospital Stay

## 2023-08-28 ENCOUNTER — Other Ambulatory Visit: Payer: Self-pay

## 2023-08-28 ENCOUNTER — Inpatient Hospital Stay
Admission: EM | Admit: 2023-08-28 | Discharge: 2023-09-01 | DRG: 871 | Disposition: A | Discharge status: Skilled Nursing Facility | Source: Skilled Nursing Facility | Attending: Internal Medicine | Admitting: Internal Medicine

## 2023-08-28 DIAGNOSIS — I959 Hypotension, unspecified: Secondary | ICD-10-CM | POA: Diagnosis present

## 2023-08-28 DIAGNOSIS — E66812 Obesity, class 2: Secondary | ICD-10-CM | POA: Diagnosis present

## 2023-08-28 DIAGNOSIS — N12 Tubulo-interstitial nephritis, not specified as acute or chronic: Secondary | ICD-10-CM | POA: Diagnosis present

## 2023-08-28 DIAGNOSIS — Z951 Presence of aortocoronary bypass graft: Secondary | ICD-10-CM | POA: Diagnosis not present

## 2023-08-28 DIAGNOSIS — N2581 Secondary hyperparathyroidism of renal origin: Secondary | ICD-10-CM | POA: Diagnosis present

## 2023-08-28 DIAGNOSIS — J9601 Acute respiratory failure with hypoxia: Secondary | ICD-10-CM

## 2023-08-28 DIAGNOSIS — K8020 Calculus of gallbladder without cholecystitis without obstruction: Secondary | ICD-10-CM | POA: Diagnosis present

## 2023-08-28 DIAGNOSIS — N10 Acute tubulo-interstitial nephritis: Secondary | ICD-10-CM | POA: Diagnosis not present

## 2023-08-28 DIAGNOSIS — I132 Hypertensive heart and chronic kidney disease with heart failure and with stage 5 chronic kidney disease, or end stage renal disease: Secondary | ICD-10-CM | POA: Diagnosis present

## 2023-08-28 DIAGNOSIS — Z992 Dependence on renal dialysis: Secondary | ICD-10-CM | POA: Diagnosis not present

## 2023-08-28 DIAGNOSIS — A4151 Sepsis due to Escherichia coli [E. coli]: Secondary | ICD-10-CM | POA: Diagnosis not present

## 2023-08-28 DIAGNOSIS — N186 End stage renal disease: Secondary | ICD-10-CM | POA: Diagnosis present

## 2023-08-28 DIAGNOSIS — Z7982 Long term (current) use of aspirin: Secondary | ICD-10-CM

## 2023-08-28 DIAGNOSIS — E119 Type 2 diabetes mellitus without complications: Secondary | ICD-10-CM

## 2023-08-28 DIAGNOSIS — J9621 Acute and chronic respiratory failure with hypoxia: Secondary | ICD-10-CM | POA: Diagnosis present

## 2023-08-28 DIAGNOSIS — I509 Heart failure, unspecified: Secondary | ICD-10-CM | POA: Diagnosis present

## 2023-08-28 DIAGNOSIS — Z6833 Body mass index (BMI) 33.0-33.9, adult: Secondary | ICD-10-CM

## 2023-08-28 DIAGNOSIS — Z794 Long term (current) use of insulin: Secondary | ICD-10-CM

## 2023-08-28 DIAGNOSIS — I2510 Atherosclerotic heart disease of native coronary artery without angina pectoris: Secondary | ICD-10-CM | POA: Diagnosis present

## 2023-08-28 DIAGNOSIS — Z7902 Long term (current) use of antithrombotics/antiplatelets: Secondary | ICD-10-CM

## 2023-08-28 DIAGNOSIS — D631 Anemia in chronic kidney disease: Secondary | ICD-10-CM | POA: Diagnosis present

## 2023-08-28 DIAGNOSIS — I10 Essential (primary) hypertension: Secondary | ICD-10-CM | POA: Diagnosis present

## 2023-08-28 DIAGNOSIS — E1122 Type 2 diabetes mellitus with diabetic chronic kidney disease: Secondary | ICD-10-CM | POA: Diagnosis present

## 2023-08-28 DIAGNOSIS — R6521 Severe sepsis with septic shock: Secondary | ICD-10-CM | POA: Diagnosis not present

## 2023-08-28 DIAGNOSIS — R112 Nausea with vomiting, unspecified: Secondary | ICD-10-CM | POA: Diagnosis present

## 2023-08-28 DIAGNOSIS — A419 Sepsis, unspecified organism: Principal | ICD-10-CM | POA: Diagnosis present

## 2023-08-28 DIAGNOSIS — Z955 Presence of coronary angioplasty implant and graft: Secondary | ICD-10-CM

## 2023-08-28 DIAGNOSIS — K219 Gastro-esophageal reflux disease without esophagitis: Secondary | ICD-10-CM | POA: Diagnosis present

## 2023-08-28 DIAGNOSIS — Z87891 Personal history of nicotine dependence: Secondary | ICD-10-CM | POA: Diagnosis not present

## 2023-08-28 LAB — COMPREHENSIVE METABOLIC PANEL WITH GFR
ALT: 8 U/L (ref 0–44)
AST: 19 U/L (ref 15–41)
Albumin: 2.7 g/dL — ABNORMAL LOW (ref 3.5–5.0)
Alkaline Phosphatase: 164 U/L — ABNORMAL HIGH (ref 38–126)
Anion gap: 15 (ref 5–15)
BUN: 63 mg/dL — ABNORMAL HIGH (ref 8–23)
CO2: 21 mmol/L — ABNORMAL LOW (ref 22–32)
Calcium: 8.4 mg/dL — ABNORMAL LOW (ref 8.9–10.3)
Chloride: 96 mmol/L — ABNORMAL LOW (ref 98–111)
Creatinine, Ser: 3.36 mg/dL — ABNORMAL HIGH (ref 0.44–1.00)
GFR, Estimated: 14 mL/min — ABNORMAL LOW (ref >60)
Glucose, Bld: 106 mg/dL — ABNORMAL HIGH (ref 70–99)
Potassium: 5.8 mmol/L — ABNORMAL HIGH (ref 3.5–5.1)
Sodium: 132 mmol/L — ABNORMAL LOW (ref 135–145)
Total Bilirubin: 1 mg/dL (ref 0.0–1.2)
Total Protein: 6.1 g/dL — ABNORMAL LOW (ref 6.5–8.1)

## 2023-08-28 LAB — CBC
HCT: 28.7 % — ABNORMAL LOW (ref 36.0–46.0)
HCT: 28.4 % — ABNORMAL LOW (ref 36.0–46.0)
Hemoglobin: 9.1 g/dL — ABNORMAL LOW (ref 12.0–15.0)
Hemoglobin: 8.9 g/dL — ABNORMAL LOW (ref 12.0–15.0)
MCH: 30.7 pg (ref 26.0–34.0)
MCH: 30.9 pg (ref 26.0–34.0)
MCHC: 31.7 g/dL (ref 30.0–36.0)
MCHC: 31.3 g/dL (ref 30.0–36.0)
MCV: 97 fL (ref 80.0–100.0)
MCV: 98.6 fL (ref 80.0–100.0)
Platelets: 202 K/uL (ref 150–400)
Platelets: 192 K/uL (ref 150–400)
RBC: 2.96 MIL/uL — ABNORMAL LOW (ref 3.87–5.11)
RBC: 2.88 MIL/uL — ABNORMAL LOW (ref 3.87–5.11)
RDW: 16.9 % — ABNORMAL HIGH (ref 11.5–15.5)
RDW: 17.2 % — ABNORMAL HIGH (ref 11.5–15.5)
WBC: 13.7 K/uL — ABNORMAL HIGH (ref 4.0–10.5)
WBC: 14.3 K/uL — ABNORMAL HIGH (ref 4.0–10.5)
nRBC: 0 % (ref 0.0–0.2)
nRBC: 0 % (ref 0.0–0.2)

## 2023-08-28 LAB — GLUCOSE, CAPILLARY
Glucose-Capillary: 115 mg/dL — ABNORMAL HIGH (ref 70–99)
Glucose-Capillary: 227 mg/dL — ABNORMAL HIGH (ref 70–99)

## 2023-08-28 LAB — LACTIC ACID, PLASMA
Lactic Acid, Venous: 1 mmol/L (ref 0.5–1.9)
Lactic Acid, Venous: 1.1 mmol/L (ref 0.5–1.9)

## 2023-08-28 LAB — LIPASE, BLOOD: Lipase: 22 U/L (ref 11–51)

## 2023-08-28 LAB — HIV ANTIBODY (ROUTINE TESTING W REFLEX): HIV Screen 4th Generation wRfx: NONREACTIVE

## 2023-08-28 LAB — HEPATITIS B SURFACE ANTIGEN: Hepatitis B Surface Ag: NONREACTIVE

## 2023-08-28 MED ORDER — HEPARIN SODIUM (PORCINE) 1000 UNIT/ML DIALYSIS: 1000.0000 [IU] | INTRAMUSCULAR | Status: DC | PRN

## 2023-08-28 MED ORDER — LIDOCAINE-PRILOCAINE 2.5-2.5 % EX CREA: 1.0000 | TOPICAL_CREAM | CUTANEOUS | Status: DC | PRN

## 2023-08-28 MED ORDER — LACTATED RINGERS IV SOLN: 150.0000 mL/h | INTRAVENOUS | Status: DC

## 2023-08-28 MED ORDER — ACETAMINOPHEN 650 MG RE SUPP: 650.0000 mg | Freq: Four times a day (QID) | RECTAL | Status: DC | PRN

## 2023-08-28 MED ORDER — INSULIN GLARGINE-YFGN 100 UNIT/ML ~~LOC~~ SOLN
7.0000 [IU] | Freq: Two times a day (BID) | SUBCUTANEOUS | Status: DC
  Administered 2023-08-28 – 2023-08-29 (×2): 7 [IU] via SUBCUTANEOUS
  Filled 2023-08-28 (×4): qty 0.07

## 2023-08-28 MED ORDER — HEPARIN SODIUM (PORCINE) 1000 UNIT/ML IJ SOLN
INTRAMUSCULAR | Status: AC
  Filled 2023-08-28: qty 10

## 2023-08-28 MED ORDER — INSULIN LISPRO 100 UNIT/ML IJ SOLN: 7.0000 [IU] | Freq: Two times a day (BID) | INTRAMUSCULAR | Status: DC

## 2023-08-28 MED ORDER — CARVEDILOL 25 MG PO TABS
25.0000 mg | ORAL_TABLET | Freq: Two times a day (BID) | ORAL | Status: DC
  Administered 2023-08-28 – 2023-08-29 (×2): 25 mg via ORAL
  Filled 2023-08-28 (×3): qty 1

## 2023-08-28 MED ORDER — MORPHINE SULFATE (PF) 4 MG/ML IV SOLN
4.0000 mg | Freq: Once | INTRAVENOUS | Status: AC
  Administered 2023-08-28: 4 mg via INTRAVENOUS
  Filled 2023-08-28: qty 1

## 2023-08-28 MED ORDER — POLYETHYLENE GLYCOL 3350 17 G PO PACK: 17.0000 g | PACK | Freq: Every day | ORAL | Status: DC | PRN

## 2023-08-28 MED ORDER — HEPARIN SODIUM (PORCINE) 5000 UNIT/ML IJ SOLN
5000.0000 [IU] | Freq: Three times a day (TID) | INTRAMUSCULAR | Status: DC
  Administered 2023-08-28 – 2023-09-01 (×11): 5000 [IU] via SUBCUTANEOUS
  Filled 2023-08-28 (×11): qty 1

## 2023-08-28 MED ORDER — ALTEPLASE 2 MG IJ SOLR: 2.0000 mg | Freq: Once | INTRAMUSCULAR | Status: DC | PRN

## 2023-08-28 MED ORDER — POLYETHYLENE GLYCOL 3350 17 G PO PACK
17.0000 g | PACK | Freq: Every day | ORAL | Status: DC
  Administered 2023-08-28 – 2023-09-01 (×5): 17 g via ORAL
  Filled 2023-08-28 (×5): qty 1

## 2023-08-28 MED ORDER — GABAPENTIN 300 MG PO CAPS
300.0000 mg | ORAL_CAPSULE | Freq: Every day | ORAL | Status: DC
  Administered 2023-08-28 – 2023-08-31 (×4): 300 mg via ORAL
  Filled 2023-08-28: qty 1
  Filled 2023-08-28: qty 3
  Filled 2023-08-28 (×2): qty 1

## 2023-08-28 MED ORDER — SODIUM CHLORIDE 0.9 % IV SOLN
2.0000 g | Freq: Once | INTRAVENOUS | Status: AC
  Administered 2023-08-28: 2 g via INTRAVENOUS
  Filled 2023-08-28: qty 20

## 2023-08-28 MED ORDER — SODIUM CHLORIDE 0.9 % IV BOLUS
500.0000 mL | Freq: Once | INTRAVENOUS | Status: AC
  Administered 2023-08-28: 500 mL via INTRAVENOUS

## 2023-08-28 MED ORDER — LISINOPRIL 5 MG PO TABS
5.0000 mg | ORAL_TABLET | Freq: Every day | ORAL | Status: DC
  Filled 2023-08-28: qty 1

## 2023-08-28 MED ORDER — ASPIRIN 81 MG PO TBEC
81.0000 mg | DELAYED_RELEASE_TABLET | Freq: Every day | ORAL | Status: DC
  Administered 2023-08-28 – 2023-09-01 (×5): 81 mg via ORAL
  Filled 2023-08-28 (×5): qty 1

## 2023-08-28 MED ORDER — OXYCODONE HCL 5 MG PO TABS: 5.0000 mg | ORAL_TABLET | ORAL | Status: DC | PRN

## 2023-08-28 MED ORDER — CHLORHEXIDINE GLUCONATE CLOTH 2 % EX PADS
6.0000 | MEDICATED_PAD | Freq: Every day | CUTANEOUS | Status: DC
  Administered 2023-08-29 – 2023-09-01 (×4): 6 via TOPICAL
  Filled 2023-08-28: qty 6

## 2023-08-28 MED ORDER — SODIUM ZIRCONIUM CYCLOSILICATE 10 G PO PACK
10.0000 g | PACK | Freq: Every day | ORAL | Status: DC
  Administered 2023-08-29 – 2023-08-31 (×3): 10 g via ORAL
  Filled 2023-08-28 (×4): qty 1

## 2023-08-28 MED ORDER — PRAVASTATIN SODIUM 20 MG PO TABS
80.0000 mg | ORAL_TABLET | Freq: Every day | ORAL | Status: DC
  Administered 2023-08-29 – 2023-09-01 (×4): 80 mg via ORAL
  Filled 2023-08-28 (×4): qty 4

## 2023-08-28 MED ORDER — SODIUM CHLORIDE 0.9 % IV SOLN
2.0000 g | INTRAVENOUS | Status: DC
  Administered 2023-08-29 – 2023-09-01 (×4): 2 g via INTRAVENOUS
  Filled 2023-08-28 (×4): qty 20

## 2023-08-28 MED ORDER — ONDANSETRON HCL 4 MG/2ML IJ SOLN: 4.0000 mg | Freq: Four times a day (QID) | INTRAMUSCULAR | Status: DC | PRN

## 2023-08-28 MED ORDER — ONDANSETRON HCL 4 MG/2ML IJ SOLN
4.0000 mg | Freq: Once | INTRAMUSCULAR | Status: AC
  Administered 2023-08-28: 4 mg via INTRAVENOUS
  Filled 2023-08-28: qty 2

## 2023-08-28 MED ORDER — PANTOPRAZOLE SODIUM 40 MG PO TBEC
40.0000 mg | DELAYED_RELEASE_TABLET | Freq: Every day | ORAL | Status: DC
  Administered 2023-08-28 – 2023-09-01 (×5): 40 mg via ORAL
  Filled 2023-08-28 (×5): qty 1

## 2023-08-28 MED ORDER — NALOXONE HCL 2 MG/2ML IJ SOSY
0.4000 mg | PREFILLED_SYRINGE | Freq: Once | INTRAMUSCULAR | Status: DC
  Filled 2023-08-28: qty 2

## 2023-08-28 MED ORDER — PENTAFLUOROPROP-TETRAFLUOROETH EX AERO: 1.0000 | INHALATION_SPRAY | CUTANEOUS | Status: DC | PRN

## 2023-08-28 MED ORDER — DULOXETINE HCL 60 MG PO CPEP: 60.0000 mg | ORAL_CAPSULE | Freq: Every day | ORAL | Status: DC

## 2023-08-28 MED ORDER — ACETAMINOPHEN 325 MG PO TABS
650.0000 mg | ORAL_TABLET | Freq: Four times a day (QID) | ORAL | Status: DC | PRN
  Administered 2023-08-29 – 2023-09-01 (×4): 650 mg via ORAL
  Filled 2023-08-28 (×3): qty 2

## 2023-08-28 MED ORDER — ONDANSETRON HCL 4 MG PO TABS: 4.0000 mg | ORAL_TABLET | Freq: Four times a day (QID) | ORAL | Status: DC | PRN

## 2023-08-28 MED ORDER — SODIUM CHLORIDE 0.9 % IV BOLUS
500.0000 mL | Freq: Once | INTRAVENOUS | Status: AC
Start: 2023-08-28 — End: 2023-08-28
  Administered 2023-08-28: 500 mL via INTRAVENOUS

## 2023-08-28 MED ORDER — INSULIN ASPART 100 UNIT/ML IJ SOLN
0.0000 [IU] | Freq: Three times a day (TID) | INTRAMUSCULAR | Status: DC
  Administered 2023-08-29: 3 [IU] via SUBCUTANEOUS
  Administered 2023-08-29 – 2023-08-30 (×2): 2 [IU] via SUBCUTANEOUS
  Administered 2023-08-31: 3 [IU] via SUBCUTANEOUS
  Administered 2023-08-31: 2 [IU] via SUBCUTANEOUS
  Administered 2023-08-31: 5 [IU] via SUBCUTANEOUS
  Filled 2023-08-28 (×7): qty 1

## 2023-08-28 MED ORDER — HYDROMORPHONE HCL 1 MG/ML IJ SOLN
1.0000 mg | Freq: Once | INTRAMUSCULAR | Status: AC
  Administered 2023-08-28: 1 mg via INTRAVENOUS
  Filled 2023-08-28: qty 1

## 2023-08-28 MED ORDER — SORBITOL 70 % SOLN
30.0000 mL | Freq: Every day | Status: DC | PRN
Start: 2023-08-28 — End: 2023-09-01

## 2023-08-28 MED ORDER — SENNOSIDES-DOCUSATE SODIUM 8.6-50 MG PO TABS
1.0000 | ORAL_TABLET | Freq: Every day | ORAL | Status: DC
  Administered 2023-08-28 – 2023-09-01 (×4): 1 via ORAL
  Filled 2023-08-28 (×6): qty 1

## 2023-08-28 MED ORDER — LIDOCAINE HCL (PF) 1 % IJ SOLN: 5.0000 mL | INTRAMUSCULAR | Status: DC | PRN

## 2023-08-28 NOTE — ED Notes (Signed)
 Pt's O2 saturation noted to be 65% on room air with a BP of 78/48 during shift change. Night shift RN and this RN both entered patient's room to assess patient. Pt encouraged to take deep breaths. O2 via [place] increased to 6 LPM. MD notified by this RN while night shift RN stayed at bedside and readjusted patient's BP cuff. Fluid bolus placed on pump. Pt rapidly imrpvoed. O2 saturation currently 98% on 5L via [place]. BP 95/48. Narcan  not given at this time due to patient's improved status and still current pain.

## 2023-08-28 NOTE — ED Notes (Signed)
 Called CCMD for central monitoring

## 2023-08-28 NOTE — ED Triage Notes (Signed)
 Pt to ED via [facility] from [facility] c/o [facility]Q abd pain. Started about 5 hrs ago and has been getting worse. Endorses some vomiting and diarrhea earlier. Denies CP, SOB, fevers

## 2023-08-28 NOTE — Assessment & Plan Note (Signed)
 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

## 2023-08-28 NOTE — ED Notes (Signed)
 Lab at bedside

## 2023-08-28 NOTE — Consult Note (Signed)
 [facility]  CONSULT NOTE    Date: 08/28/2023                  Patient Name:  Katrina Ortiz  MRN: [id number]  DOB: [birth year]  Age / Sex: 68 y.o., female         PCP: Relda Heron LABOR, MD                 Service Requesting Consult: [facility]                 Reason for Consult: End stage renal disease on hemodialysis            History of Present Illness: Katrina Ortiz is a 68 y.o.  female with past medical history of diabetes, hypertension, CAD, GERD, and end stage renal disease on HD, who was admitted to [facility] on 08/28/2023 for Pyelonephritis [N12] ESRD on hemodialysis (HCC) [N18.6, Z99.2] Sepsis (HCC) [A41.9]  Patient is a resident at [facility] and has been for 4 months. She receives dialysis in-facility on MTWF.  Reports no recently missed treatments.  She presents to the ED with severe left flank pain. Also endorses nausea and loose stools.   Labs on ED arrival concerning for sodium 132, potassium 5.8, serum bicarb 21, BUN 63, creatinine 3.36 with GFR 14, white count 13.7 with hemoglobin 9.1.  CT abdomen pelvis suggestive emphysematous pyelitis.  Chest x-ray shows vascular congestion.  Medications: Outpatient medications: Medications Prior to Admission  Medication Sig Dispense Refill Last Dose/Taking   acetaminophen  (TYLENOL ) 325 MG tablet Take 325 mg by mouth every 6 (six) hours as needed for mild pain (pain score 1-3).   08/27/2023 at  9:30 PM   aspirin  EC 81 MG tablet Take 1 tablet by mouth daily.   08/27/2023 at  8:00 AM   bumetanide (BUMEX) 1 MG tablet Take 4 mg by mouth 2 (two) times daily.   08/27/2023 at  4:00 PM   Carboxymethylcellulose Sodium 1 % GEL Apply 1 drop to eye at bedtime.   08/27/2023 at  8:00 PM   carvedilol  (COREG ) 6.25 MG tablet Take 6.25 mg by mouth 2 (two) times daily.   08/27/2023 at  4:00 PM   cetirizine (ZYRTEC) 10 MG tablet Take 5 mg by mouth daily.   08/27/2023 at  9:00 AM   cholecalciferol (VITAMIN D3) 25 MCG (1000 UNIT) tablet Take  1,000 Units by mouth daily.   08/27/2023 at  8:00 AM   diclofenac Sodium (VOLTAREN) 1 % GEL Apply 4 g topically 3 (three) times daily.   08/27/2023 at  8:00 PM   ezetimibe (ZETIA) 10 MG tablet Take 10 mg by mouth daily.   08/27/2023 at  8:00 AM   ferrous sulfate 325 (65 FE) MG EC tablet Take 325 mg by mouth every other day.   08/27/2023   fluticasone  (FLONASE ) 50 MCG/ACT nasal spray Place 1 spray into both nostrils 2 (two) times daily.   08/27/2023 at  4:00 PM   gabapentin  (NEURONTIN ) 100 MG capsule Take 100 mg by mouth 2 (two) times daily.   08/27/2023 at  2:00 PM   guaiFENesin  (MUCINEX ) 600 MG 12 hr tablet Take 600 mg by mouth 2 (two) times daily.   08/27/2023 at  8:00 PM   insulin  aspart (NOVOLOG ) 100 UNIT/ML injection Inject into the skin. Give before meals and at bedtime per sliding scale. BS < 80 call MD, BS 201 to 250 give 2 units, BS 251 to 300 give 4  units, BS 301 to 350 give 6 units, BS 351 to 400 give 8 units, BS 401 to 450 give 10 units, BS 451 to 500 give 12 units, BS > 500 give 14 units and call MD   08/27/2023 at  9:00 PM   LANTUS  SOLOSTAR 100 UNIT/ML Solostar Pen Inject 10 Units into the skin at bedtime. Hold if sugar < 100 (Patient taking differently: Inject 15 Units into the skin at bedtime. Hold if sugar < 100) 15 mL 11 08/27/2023 at  8:00 PM   latanoprost (XALATAN) 0.005 % ophthalmic solution Place 1 drop into both eyes at bedtime.   08/27/2023 at  4:00 PM   linaclotide (LINZESS) 145 MCG CAPS capsule Take 145 mcg by mouth daily.   08/27/2023 at  4:00 PM   montelukast (SINGULAIR) 10 MG tablet Take 10 mg by mouth daily.   08/27/2023 at  8:00 PM   Multiple Vitamins-Minerals (DECUBI-VITE) CAPS Take 1 capsule by mouth every morning.   08/27/2023 at  8:00 AM   nitroGLYCERIN (NITROSTAT) 0.4 MG SL tablet Place 0.4 mg under the tongue every 5 (five) minutes as needed for chest pain.   Taking As Needed   omeprazole (PRILOSEC) 40 MG capsule Take 40 mg by mouth 2 (two) times daily.   08/27/2023 at  4:00 PM   polyethylene  glycol (MIRALAX  / GLYCOLAX ) 17 g packet Take 17 g by mouth 2 (two) times daily.   08/27/2023 at  4:00 PM   senna-docusate (SENOKOT-S) 8.6-50 MG tablet Take 2 tablets by mouth at bedtime.   08/27/2023 at  8:00 PM   simethicone  (MYLICON) 80 MG chewable tablet Chew 80 mg by mouth every 6 (six) hours as needed for flatulence.   Taking As Needed   zinc oxide 20 % ointment Apply 1 Application topically as needed for irritation.   Taking As Needed   artificial tears ophthalmic solution 1 drop at bedtime. (Patient not taking: Reported on 08/28/2023)   Not Taking   bumetanide (BUMEX) 2 MG tablet Take 2 mg by mouth daily. (Patient not taking: Reported on 08/28/2023)   Not Taking   carvedilol  (COREG ) 25 MG tablet Take 6.25 mg by mouth 2 (two) times daily. (Patient not taking: Reported on 08/28/2023)   Not Taking   cetirizine (ZYRTEC) 10 MG chewable tablet Chew 10 mg by mouth daily. (Patient not taking: Reported on 08/28/2023)   Not Taking   cetirizine (ZYRTEC) 5 MG chewable tablet Chew 5 mg by mouth every 6 (six) hours. (Patient not taking: Reported on 08/28/2023)   Not Taking   clopidogrel  (PLAVIX ) 75 MG tablet Take 75 mg by mouth daily. (Patient not taking: Reported on 08/28/2023)   Not Taking   colchicine 0.6 MG tablet Take 0.6 mg by mouth 3 (three) times a week. On M, W, F (Patient not taking: Reported on 08/28/2023)   Not Taking   doxycycline  (VIBRA -TABS) 100 MG tablet Take 100 mg by mouth 2 (two) times daily. (Patient not taking: Reported on 08/28/2023)   Not Taking   Dulaglutide (TRULICITY) 0.75 MG/0.5ML SOPN Inject 0.75 mg into the skin once a week. On Tuesday (Patient not taking: Reported on 08/28/2023)   Not Taking   DULoxetine  (CYMBALTA ) 60 MG capsule Take 60 mg by mouth daily. (Patient not taking: Reported on 08/28/2023)   Not Taking   furosemide  (LASIX ) 20 MG tablet Take 20 mg by mouth 2 (two) times daily. (Patient not taking: Reported on 08/28/2023)   Not Taking   gabapentin  (NEURONTIN ) 300  MG capsule Take 100 mg by mouth 2  (two) times daily. (Patient not taking: Reported on 08/28/2023)   Not Taking   hydrALAZINE  (APRESOLINE ) 25 MG tablet Take 1 tablet (25 mg total) by mouth every 8 (eight) hours. (Patient not taking: Reported on 08/28/2023)   Not Taking   insulin  lispro (HUMALOG ) 100 UNIT/ML injection Inject into the skin 3 (three) times daily before meals. SSI (Patient not taking: Reported on 08/28/2023)   Not Taking   leflunomide (ARAVA) 10 MG tablet Take 10 mg by mouth daily. (Patient not taking: Reported on 08/28/2023)   Not Taking   lisinopril  (ZESTRIL ) 5 MG tablet Take 5 mg by mouth daily. (Patient not taking: Reported on 08/28/2023)   Not Taking   metolazone (ZAROXOLYN) 5 MG tablet Take 5 mg by mouth as directed. (Patient not taking: Reported on 08/28/2023)   Not Taking   omeprazole (PRILOSEC) 20 MG capsule Take 20 mg by mouth 2 (two) times daily. (Patient not taking: Reported on 08/28/2023)   Not Taking   oxyCODONE -acetaminophen  (PERCOCET/ROXICET) 5-325 MG tablet Take 1 tablet by mouth every 8 (eight) hours as needed for moderate pain. 10 tablet 0    polyethylene glycol (MIRALAX  / GLYCOLAX ) 17 g packet Take 17 g by mouth daily. (Patient not taking: Reported on 08/28/2023) 14 each 0 Not Taking   pravastatin  (PRAVACHOL ) 80 MG tablet Take 80 mg by mouth daily. (Patient not taking: Reported on 08/28/2023)   Not Taking   predniSONE  (DELTASONE ) 10 MG tablet Take 1 tablet (10 mg total) by mouth daily with breakfast. (Patient not taking: Reported on 08/28/2023) 2 tablet 0 Not Taking   prochlorperazine (COMPAZINE) 5 MG tablet Take 5 mg by mouth every 6 (six) hours as needed for nausea or vomiting. (Patient not taking: Reported on 08/28/2023)   Not Taking   senna-docusate (SENOKOT-S) 8.6-50 MG tablet Take 1 tablet by mouth daily. (Patient not taking: Reported on 08/28/2023) 30 tablet 0 Not Taking   sodium zirconium cyclosilicate  (LOKELMA ) 10 g PACK packet Take 10 g by mouth daily. (Patient not taking: Reported on 08/28/2023)   Not Taking     Current medications: Current Facility-Administered Medications  Medication Dose Route Frequency Provider Last Rate Last Admin   acetaminophen  (TYLENOL ) tablet 650 mg  650 mg Oral Q6H PRN Swayze, Ava, DO       Or   acetaminophen  (TYLENOL ) suppository 650 mg  650 mg Rectal Q6H PRN Swayze, Ava, DO       alteplase  (CATHFLO ACTIVASE ) injection 2 mg  2 mg Intracatheter Once PRN Lateef, Munsoor, MD       aspirin  EC tablet 81 mg  81 mg Oral Daily Swayze, Ava, DO       carvedilol  (COREG ) tablet 25 mg  25 mg Oral BID Swayze, Ava, DO       [START ON 08/29/2023] cefTRIAXone  (ROCEPHIN ) 2 g in sodium chloride  0.9 % 100 mL IVPB  2 g Intravenous Q24H Swayze, Ava, DO       Chlorhexidine  Gluconate Cloth 2 % PADS 6 each  6 each Topical Q0600 Lateef, Munsoor, MD       gabapentin  (NEURONTIN ) capsule 300 mg  300 mg Oral QHS Swayze, Ava, DO       heparin  injection 1,000 Units  1,000 Units Dialysis PRN Lateef, Munsoor, MD       heparin  injection 5,000 Units  5,000 Units Subcutaneous Q8H Swayze, Ava, DO       insulin  aspart (novoLOG ) injection 0-15 Units  0-15 Units  Subcutaneous TID WC Lenon Elsie HERO, RPH       insulin  glargine-yfgn (SEMGLEE ) injection 7 Units  7 Units Subcutaneous BID Swayze, Ava, DO       lidocaine  (PF) (XYLOCAINE ) 1 % injection 5 mL  5 mL Intradermal PRN Lateef, Munsoor, MD       lidocaine -prilocaine  (EMLA ) cream 1 Application  1 Application Topical PRN Lateef, Munsoor, MD       lisinopril  (ZESTRIL ) tablet 5 mg  5 mg Oral Daily Swayze, Ava, DO       naloxone  (NARCAN ) injection 0.4 mg  0.4 mg Intravenous Once Viviann Pastor, MD       ondansetron  (ZOFRAN ) tablet 4 mg  4 mg Oral Q6H PRN Swayze, Ava, DO       Or   ondansetron  (ZOFRAN ) injection 4 mg  4 mg Intravenous Q6H PRN Swayze, Ava, DO       oxyCODONE  (Oxy IR/ROXICODONE ) immediate release tablet 5 mg  5 mg Oral Q4H PRN Swayze, Ava, DO       pantoprazole  (PROTONIX ) EC tablet 40 mg  40 mg Oral Daily Swayze, Ava, DO        pentafluoroprop-tetrafluoroeth (GEBAUERS) aerosol 1 Application  1 Application Topical PRN Lateef, Munsoor, MD       polyethylene glycol (MIRALAX  / GLYCOLAX ) packet 17 g  17 g Oral Daily Swayze, Ava, DO       pravastatin  (PRAVACHOL ) tablet 80 mg  80 mg Oral Daily Swayze, Ava, DO       senna-docusate (Senokot-S) tablet 1 tablet  1 tablet Oral Daily Swayze, Ava, DO       sodium zirconium cyclosilicate  (LOKELMA ) packet 10 g  10 g Oral Daily Swayze, Ava, DO       sorbitol  70 % solution 30 mL  30 mL Oral Daily PRN Swayze, Ava, DO          Allergies: No Known Allergies    Past Medical History: Past Medical History:  Diagnosis Date   Anemia    CHF (congestive heart failure) (HCC)    Chronic kidney disease    Coronary artery disease    Diabetes mellitus without complication (HCC)    DISH (diffuse idiopathic skeletal hyperostosis)    GERD (gastroesophageal reflux disease)    Hyperkalemia    Hypertension    Psoriasis    Psoriasis      Past Surgical History: Past Surgical History:  Procedure Laterality Date   CESAREAN SECTION     2 c-sections   CORONARY ARTERY BYPASS GRAFT     CORONARY STENT PLACEMENT     HERNIA REPAIR     TUBAL LIGATION       Family History: History reviewed. No pertinent family history.   Social History: Social History   Socioeconomic History   Marital status: Married    Spouse name: Not on file   Number of children: Not on file   Years of education: Not on file   Highest education level: Not on file  Occupational History   Not on file  Tobacco Use   Smoking status: Former    Current packs/day: 0.00    Types: Cigarettes    Quit date: 11/22/2002    Years since quitting: 20.7   Smokeless tobacco: Never  Substance and Sexual Activity   Alcohol use: No   Drug use: No   Sexual activity: Not on file  Other Topics Concern   Not on file  Social History Narrative   Not on file   Social  Drivers of Corporate investment banker Strain: Low Risk   (07/06/2023)   Received from [facility]   Overall Financial Resource Strain (CARDIA)    Difficulty of Paying Living Expenses: Not hard at all  Food Insecurity: No Food Insecurity (07/06/2023)   Received from [facility]   Hunger Vital Sign    Within the past 12 months, you worried that your food would run out before you got the money to buy more.: Never true    Within the past 12 months, the food you bought just didn't last and you didn't have money to get more.: Never true  Transportation Needs: No Transportation Needs (07/06/2023)   Received from [facility]   PRAPARE - Transportation    Lack of Transportation (Medical): No    Lack of Transportation (Non-Medical): No  Physical Activity: Inactive (07/08/2020)   Received from [facility]   Exercise Vital Sign    On average, how many days per week do you engage in moderate to strenuous exercise (like a brisk walk)?: 0 days    On average, how many minutes do you engage in exercise at this level?: 0 min  Stress: No Stress Concern Present (07/08/2020)   Received from [facility] - Occupational Stress Questionnaire    Feeling of Stress : Not at all  Social Connections: Moderately Isolated (07/08/2020)   Received from [facility]   Social Connection and Isolation Panel    In a typical week, how many times do you talk on the phone with family, friends, or neighbors?: More than three times a week    How often do you get together with friends or relatives?: More than three times a week    How often do you attend church or religious services?: More than 4 times per year    Do you belong to any clubs or organizations such as church groups, unions, fraternal or athletic groups, or school groups?: No    Attends Engineer, structural: Not on file    Are you married, widowed, divorced, separated, never married, or living with a partner?: Widowed  Intimate Partner Violence: Not At  Risk (07/04/2023)   Received from [facility]   Humiliation, Afraid, Rape, and Kick questionnaire    Within the last year, have you been afraid of your partner or ex-partner?: No    Within the last year, have you been humiliated or emotionally abused in other ways by your partner or ex-partner?: No    Within the last year, have you been kicked, hit, slapped, or otherwise physically hurt by your partner or ex-partner?: No    Within the last year, have you been raped or forced to have any kind of sexual activity by your partner or ex-partner?: No     Review of Systems: Review of Systems  Constitutional:  Negative for chills, fever and malaise/fatigue.  HENT:  Negative for congestion, sore throat and tinnitus.   Eyes:  Negative for blurred vision and redness.  Respiratory:  Negative for cough, shortness of breath and wheezing.   Cardiovascular:  Negative for chest pain, palpitations, claudication and leg swelling.  Gastrointestinal:  Positive for diarrhea and nausea. Negative for abdominal pain, blood in stool and vomiting.  Genitourinary:  Negative for flank pain, frequency and hematuria.  Musculoskeletal:  Negative for back pain, falls and myalgias.  Skin:  Negative for rash.  Neurological:  Negative for dizziness, weakness  and headaches.  Endo/Heme/Allergies:  Does not bruise/bleed easily.  Psychiatric/Behavioral:  Negative for depression. The patient is not nervous/anxious and does not have insomnia.     Vital Signs: Blood pressure (!) 120/96, pulse 84, temperature 98 F (36.7 C), temperature source Oral, resp. rate 15, weight 95.3 kg, SpO2 100%.  Weight trends: Filed Weights   08/28/23 1316  Weight: 95.3 kg    Physical Exam: General: NAD  Head: Normocephalic, atraumatic. Moist oral mucosal membranes  Eyes: Anicteric  Neck: Supple  Lungs:  Clear to auscultation, normal effort  Heart: Regular rate and rhythm  Abdomen:  Soft, nontender,   Extremities: Trace peripheral  edema.  Neurologic: Nonfocal, moving all four extremities  Skin: No lesions  Access: Right chest PermCath     Lab results: Basic Metabolic Panel: Recent Labs  Lab 08/28/23 0631  NA 132*  K 5.8*  CL 96*  CO2 21*  GLUCOSE 106*  BUN 63*  CREATININE 3.36*  CALCIUM  8.4*    Liver Function Tests: Recent Labs  Lab 08/28/23 0631  AST 19  ALT 8  ALKPHOS 164*  BILITOT 1.0  PROT 6.1*  ALBUMIN  2.7*   Recent Labs  Lab 08/28/23 0631  LIPASE 22   No results for input(s): AMMONIA in the last 168 hours.  CBC: Recent Labs  Lab 08/28/23 0130 08/28/23 1036  WBC 13.7* 14.3*  HGB 9.1* 8.9*  HCT 28.7* 28.4*  MCV 97.0 98.6  PLT 202 192    Cardiac Enzymes: No results for input(s): CKTOTAL, CKMB, CKMBINDEX, TROPONINI in the last 168 hours.  BNP: Invalid input(s): POCBNP  CBG: No results for input(s): GLUCAP in the last 168 hours.  Microbiology: Results for orders placed or performed during the hospital encounter of 04/14/21  Urine Culture     Status: Abnormal   Collection Time: 04/12/21  7:35 PM   Specimen: Urine, Clean Catch  Result Value Ref Range Status   Specimen Description   Final    URINE, CLEAN CATCH Performed at [facility], [address] [place], [place] [address]    Special Requests   Final    NONE Performed at [facility], [address] [place], [place] [address]    Culture >=100,000 COLONIES/mL ESCHERICHIA COLI (A)  Final   Report Status 04/16/2021 FINAL  Final   Organism ID, Bacteria ESCHERICHIA COLI (A)  Final      Susceptibility   Escherichia coli - MIC*    AMPICILLIN <=2 SENSITIVE Sensitive     CEFAZOLIN <=4 SENSITIVE Sensitive     CEFEPIME <=0.12 SENSITIVE Sensitive     CEFTRIAXONE  <=0.25 SENSITIVE Sensitive     CIPROFLOXACIN <=0.25 SENSITIVE Sensitive     GENTAMICIN <=1 SENSITIVE Sensitive     IMIPENEM <=0.25 SENSITIVE Sensitive     NITROFURANTOIN <=16 SENSITIVE Sensitive     TRIMETH /SULFA  <=20  SENSITIVE Sensitive     AMPICILLIN/SULBACTAM <=2 SENSITIVE Sensitive     PIP/TAZO <=4 SENSITIVE Sensitive     * >=100,000 COLONIES/mL ESCHERICHIA COLI  Resp Panel by RT-PCR (Flu A&B, Covid) Nasopharyngeal Swab     Status: None   Collection Time: 04/14/21  2:40 AM   Specimen: Nasopharyngeal Swab; Nasopharyngeal(NP) swabs in vial transport medium  Result Value Ref Range Status   SARS Coronavirus 2 by RT PCR NEGATIVE NEGATIVE Final    Comment: (NOTE) SARS-CoV-2 target nucleic acids are NOT DETECTED.  The SARS-CoV-2 RNA is generally detectable in upper respiratory specimens during the acute phase of infection. The lowest concentration  of SARS-CoV-2 viral copies this assay can detect is 138 copies/mL. A negative result does not preclude SARS-Cov-2 infection and should not be used as the sole basis for treatment or other patient management decisions. A negative result may occur with  improper specimen collection/handling, submission of specimen other than nasopharyngeal swab, presence of viral mutation(s) within the areas targeted by this assay, and inadequate number of viral copies(<138 copies/mL). A negative result must be combined with clinical observations, patient history, and epidemiological information. The expected result is Negative.  Fact Sheet for Patients:  BloggerCourse.com  Fact Sheet for Healthcare Providers:  SeriousBroker.it  This test is no t yet approved or cleared by the [place]  FDA and  has been authorized for detection and/or diagnosis of SARS-CoV-2 by FDA under an Emergency Use Authorization (EUA). This EUA will remain  in effect (meaning this test can be used) for the duration of the COVID-19 declaration under Section 564(b)(1) of the Act, 21 U.S.C.section 360bbb-3(b)(1), unless the authorization is terminated  or revoked sooner.       Influenza A by PCR NEGATIVE NEGATIVE Final   Influenza B by PCR  NEGATIVE NEGATIVE Final    Comment: (NOTE) The Xpert Xpress SARS-CoV-2/FLU/RSV plus assay is intended as an aid in the diagnosis of influenza from Nasopharyngeal swab specimens and should not be used as a sole basis for treatment. Nasal washings and aspirates are unacceptable for Xpert Xpress SARS-CoV-2/FLU/RSV testing.  Fact Sheet for Patients: BloggerCourse.com  Fact Sheet for Healthcare Providers: SeriousBroker.it  This test is not yet approved or cleared by the [place]  FDA and has been authorized for detection and/or diagnosis of SARS-CoV-2 by FDA under an Emergency Use Authorization (EUA). This EUA will remain in effect (meaning this test can be used) for the duration of the COVID-19 declaration under Section 564(b)(1) of the Act, 21 U.S.C. section 360bbb-3(b)(1), unless the authorization is terminated or revoked.  Performed at [facility], [address] [place], [place] [address]     Coagulation Studies: No results for input(s): LABPROT, INR in the last 72 hours.  Urinalysis: No results for input(s): COLORURINE, LABSPEC, PHURINE, GLUCOSEU, HGBUR, BILIRUBINUR, KETONESUR, PROTEINUR, UROBILINOGEN, NITRITE, LEUKOCYTESUR in the last 72 hours.  Invalid input(s): APPERANCEUR    Imaging: DG Chest Port 1 View Result Date: 08/28/2023 CLINICAL DATA:  Hypoxia. EXAM: PORTABLE CHEST 1 VIEW COMPARISON:  Same day radiographs dated 08/28/2023 at 10:07 a.m. FINDINGS: The heart size and mediastinal contours are unchanged. Prior median sternotomy and CABG. Aortic atherosclerosis. Stable dialysis catheter tip at the superior cavoatrial junction. Central pulmonary vascular congestion. No overt edema, focal consolidation, sizeable pleural effusion, or pneumothorax. Visualized osseous structures are unchanged. IMPRESSION: No significant change compared to the prior exam. Cardiomegaly with central  pulmonary vascular congestion. Electronically Signed   By: Harrietta Sherry M.D.   On: 08/28/2023 12:15   Portable chest 1 View Result Date: 08/28/2023 CLINICAL DATA:  Hypoxia. EXAM: PORTABLE CHEST 1 VIEW COMPARISON:  Chest radiograph dated 04/13/2018 FINDINGS: Dialysis catheter with tip at the cavoatrial junction. There is cardiomegaly with vascular congestion. No focal consolidation, pleural effusion or pneumothorax. Median sternotomy wires. No acute osseous pathology. IMPRESSION: Cardiomegaly with vascular congestion. Electronically Signed   By: Vanetta Chou M.D.   On: 08/28/2023 10:17   CT ABDOMEN PELVIS WO CONTRAST Result Date: 08/28/2023 CLINICAL DATA:  Abdominal pain in the left lower quadrant. Vomiting and diarrhea. EXAM: CT ABDOMEN AND PELVIS WITHOUT CONTRAST TECHNIQUE: Multidetector CT imaging of the abdomen and pelvis was  performed following the standard protocol without IV contrast. RADIATION DOSE REDUCTION: This exam was performed according to the departmental dose-optimization program which includes automated exposure control, adjustment of the mA and/or kV according to patient size and/or use of iterative reconstruction technique. COMPARISON:  06/02/2023 FINDINGS: Lower chest: Dependent collapse/consolidation with stable small volume bilateral dependent chronic pleural thickening/fluid. Hepatobiliary: No suspicious focal abnormality in the liver on this study without intravenous contrast. Tiny gallstones evident. No intrahepatic or extrahepatic biliary dilation. Pancreas: Pancreas is diffusely atrophic without main duct dilatation. Spleen: No splenomegaly. No suspicious focal mass lesion. Adrenals/Urinary Tract: No adrenal nodule or mass. Cortical thinning noted right kidney without evidence for hydroureteronephrosis. Gas is noted in the intrarenal collecting system of the left kidney with mild fullness of the left intrarenal collecting system and ureter. Gas is visible in the urinary bladder.  Stomach/Bowel: Tiny hiatal hernia. Stomach otherwise unremarkable. Duodenum is normally positioned as is the ligament of Treitz. No small bowel wall thickening. No small bowel dilatation. The terminal ileum is normal. The appendix is normal. No gross colonic mass. No colonic wall thickening. Vascular/Lymphatic: There is moderate atherosclerotic calcification of the abdominal aorta without aneurysm. There is no gastrohepatic or hepatoduodenal ligament lymphadenopathy. No retroperitoneal or mesenteric lymphadenopathy. No pelvic sidewall lymphadenopathy. Reproductive: There is no adnexal mass. Other: No intraperitoneal free fluid. Musculoskeletal: No worrisome lytic or sclerotic osseous abnormality. IMPRESSION: 1. Gas in the intrarenal collecting system of the left kidney with mild fullness of the left intrarenal collecting system and ureter. Gas is visible in the urinary bladder. 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 2. Cholelithiasis. 3. Tiny hiatal hernia. 4. Stable small volume bilateral dependent chronic pleural thickening/fluid with dependent collapse/consolidation in the lung bases. 5. Aortic Atherosclerosis (ICD10-I70.0) and Emphysema (ICD10-J43.9). Electronically Signed   By: Camellia Candle M.D.   On: 08/28/2023 05:28     Assessment & Plan: Katrina Ortiz is a 68 y.o.  female with past medical history of diabetes, hypertension, CAD, GERD, and end stage renal disease on HD, who was admitted to [facility] on 08/28/2023 for Pyelonephritis [N12] ESRD on hemodialysis (HCC) [N18.6, Z99.2] Sepsis (HCC) [A41.9]  End-stage renal disease on hemodialysis.  Last treatment completed on Friday.  Patient will receive dialysis today, UF goal 1.5 L as tolerated.  Next treatment scheduled for Wednesday.  Will maintain MWF schedule during this admission.  2. Anemia of chronic kidney disease Lab Results  Component Value Date   HGB 8.9 (L) 08/28/2023     Hemoglobin just below optimal range.  Will continue to monitor and assess need for ESA's.  3. Secondary Hyperparathyroidism: with outpatient labs: Unavailable Lab Results  Component Value Date   CALCIUM  8.4 (L) 08/28/2023   PHOS 3.1 04/13/2019    Will continue to monitor bone minerals during this admission.  4.  Hypertension with chronic kidney disease.  Home regimen includes carvedilol , Bumex, furosemide , hydralazine , lisinopril .   LOS: 0 Dailey Buccheri 7/7/20254:47 PM

## 2023-08-28 NOTE — Assessment & Plan Note (Signed)
 The patient will be started on ceftriaxone  2 gm IV daily. On 07/12/2023 the patient had blood cultures positive for e coli that was pan sensitive. Blood cultures have been ordered.

## 2023-08-28 NOTE — Assessment & Plan Note (Signed)
 Prior to presentation the patient's glucoses were controlled with lantus  7 units bid and humalog  7 units bid. She will also be followed by FSBS and SSI while inpatient.

## 2023-08-28 NOTE — H&P (Signed)
 History and Physical    Patient: Katrina Ortiz FMW:[id number] DOB: 1955-07-18 DOA: 08/28/2023 DOS: the patient was seen and examined on 08/28/2023 PCP: Relda Heron LABOR, MD  Patient coming from: SNF  Chief Complaint:  Chief Complaint  Patient presents with   Abdominal Pain   HPI: Katrina Ortiz is a 68 y.o. female with medical history significant for DM II, Chronic hypoxic respiratory failure (2L) ESRD-HD, AOCD, HTN, CAD, Diffuse idiopathic skeletal hyperostosis, GERD, hyperkalemia.  The patient presented to [facility] ED on 08/28/2023 after she developed severe left flank pain last night. She states that she has had chills, but no fevers that she has noticed. She states that she has had some loose bowel movements. She states that she is nauseated. She is on dialysis and makes very little urine.  The patient states that she had dialysis last Friday and that it was a normal dialysis for her. Nephrology has been consulted, and she is set up for dialysis later today, if they can fit her into the schedule.  In the ED the patient was found to be hypotensive at 85/48, and hypoxic on her usual 2L. She is currently requiring 5L in order to maintain saturations in the 90's.   CT abdomen and pelvis has demonstrated emphysematous pyelonephritis on the left as well as cholelithiasis. The scan also captured the base of her lungs which demonstrated chronic stable small volume bilateral dependent pleural thickening with dependent collapse and consolidation in the lung base.   Blood cultures x 2 have been ordered. The patient will receive Ceftriaxone  2 gm IV daily. The patient had e coli bacteremia in May of this year. It was pan sensitive.   She will be admitted to a telemetry bed. Review of Systems: As mentioned in the history of present illness. All other systems reviewed and are negative. Past Medical History:  Diagnosis Date   Anemia    CHF (congestive heart failure) (HCC)    Chronic kidney disease     Coronary artery disease    Diabetes mellitus without complication (HCC)    DISH (diffuse idiopathic skeletal hyperostosis)    GERD (gastroesophageal reflux disease)    Hyperkalemia    Hypertension    Psoriasis    Psoriasis    Past Surgical History:  Procedure Laterality Date   CESAREAN SECTION     2 c-sections   CORONARY ARTERY BYPASS GRAFT     CORONARY STENT PLACEMENT     HERNIA REPAIR     TUBAL LIGATION     Social History:  reports that she quit smoking about 20 years ago. Her smoking use included cigarettes. She has never used smokeless tobacco. She reports that she does not drink alcohol and does not use drugs.  No Known Allergies  History reviewed. No pertinent family history.  Prior to Admission medications   Medication Sig Start Date End Date Taking? Authorizing Provider  aspirin  EC 81 MG tablet Take 1 tablet by mouth daily. 07/24/22  Yes [provider]  bumetanide (BUMEX) 1 MG tablet Take 4 mg by mouth 2 (two) times daily. 07/11/23  Yes [provider]  bumetanide (BUMEX) 2 MG tablet Take 2 mg by mouth daily. 08/09/23  Yes [provider]  omeprazole (PRILOSEC) 20 MG capsule Take 20 mg by mouth daily at 12 noon.   Yes [provider]  polyethylene glycol (MIRALAX  / GLYCOLAX ) 17 g packet Take 17 g by mouth daily. 04/21/21  Yes Jillian Buttery, MD  acetaminophen  (TYLENOL ) 325 MG tablet  Take 325 mg by mouth every 6 (six) hours as needed for mild pain (pain score 1-3).    [provider]  carvedilol  (COREG ) 25 MG tablet Take 25 mg by mouth 2 (two) times daily. 03/07/19   [provider]  clopidogrel  (PLAVIX ) 75 MG tablet Take 75 mg by mouth daily. Patient not taking: Reported on 08/28/2023 03/28/19   [provider]  colchicine 0.6 MG tablet Take 0.6 mg by mouth 3 (three) times a week. On M, W, F    [provider]  doxycycline  (VIBRA -TABS) 100 MG tablet Take 100 mg by mouth 2 (two) times daily. Patient not taking:  Reported on 08/28/2023 06/03/23   [provider]  Dulaglutide (TRULICITY) 0.75 MG/0.5ML SOPN Inject 0.75 mg into the skin once a week. On Tuesday    [provider]  DULoxetine  (CYMBALTA ) 60 MG capsule Take 60 mg by mouth daily. 03/28/19   [provider]  furosemide  (LASIX ) 20 MG tablet Take 20 mg by mouth 2 (two) times daily. 12/03/20   [provider]  gabapentin  (NEURONTIN ) 300 MG capsule Take 300 mg by mouth at bedtime. 12/29/20   [provider]  hydrALAZINE  (APRESOLINE ) 25 MG tablet Take 1 tablet (25 mg total) by mouth every 8 (eight) hours. 04/20/21   Jillian Buttery, MD  insulin  lispro (HUMALOG ) 100 UNIT/ML injection Inject into the skin 3 (three) times daily before meals. SSI    [provider]  LANTUS  SOLOSTAR 100 UNIT/ML Solostar Pen Inject 10 Units into the skin at bedtime. Hold if sugar < 100 Patient taking differently: Inject 7 Units into the skin 2 (two) times daily. Hold if sugar < 100 04/11/21   Shah, Vipul, MD  leflunomide (ARAVA) 10 MG tablet Take 10 mg by mouth daily.    [provider]  lisinopril  (ZESTRIL ) 5 MG tablet Take 5 mg by mouth daily.    [provider]  oxyCODONE -acetaminophen  (PERCOCET/ROXICET) 5-325 MG tablet Take 1 tablet by mouth every 8 (eight) hours as needed for moderate pain. 04/20/21   Adhikari, Amrit, MD  pravastatin  (PRAVACHOL ) 80 MG tablet Take 80 mg by mouth daily. 03/07/19   [provider]  predniSONE  (DELTASONE ) 10 MG tablet Take 1 tablet (10 mg total) by mouth daily with breakfast. 04/22/21   Jillian Buttery, MD  senna-docusate (SENOKOT-S) 8.6-50 MG tablet Take 1 tablet by mouth daily. 06/02/23   Malvina Alm DASEN, MD  sodium zirconium cyclosilicate  (LOKELMA ) 10 g PACK packet Take 10 g by mouth daily.    [provider]    Physical Exam: Vitals:   08/28/23 0720 08/28/23 0800 08/28/23 0900 08/28/23 1014  BP: (!) 95/48 (!) 120/56 (!) 140/56   Pulse: 73 72 73 71  Resp: 10  (!) 8 (!) 9 (!) 8  Temp:      TempSrc:      SpO2: 98% 100% 100% 100%   Exam:  Constitutional:  The patient is awake, alert, and oriented x 3. She is morbidly obese. She is in mild distress. Eyes:  pupils and irises appear normal Normal lids and conjunctivae ENMT:  grossly normal hearing  Lips appear normal external ears, nose appear normal Oropharynx: mucosa, tongue,posterior pharynx appear normal Neck:  neck appears normal, no masses, normal ROM, supple no thyromegaly Respiratory:  No increased work of breathing. No wheezes, rales, or rhonchi Diminished lung sounds at bases. No tactile fremitus Cardiovascular:  Regular rate and rhythm No murmurs, ectopy, or gallups. No lateral PMI. No thrills. Abdomen:  Abdomen is soft, non-tender, non-distended Morbidly obese No hernias, masses, or organomegaly Normoactive bowel sounds.  Musculoskeletal:  No cyanosis, clubbing, or edema Skin:  No rashes, lesions, ulcers palpation of skin: no induration or nodules Neurologic:  CN 2-12 intact Sensation all 4 extremities intact Psychiatric:  Mental status Mood, affect appropriate Orientation to person, place, time  judgment and insight appear intact  Data Reviewed:  CT abdomen and pelvis CBC CMP  Assessment and Plan: Acute on chronic hypoxic respiratory failure (HCC) The patient is chronically on 2L O2 by nasal cannula. Since being in the ED she is requiring 5L in order to maintain saturations in the 90's.   Anemia of chronic kidney failure, stage 4 (severe) (HCC) Noted. Due to ESRD. Hemoglobin today is 9.1. This is improved from prior hemoglobin on 8.0 obtained on 06/02/2023. Monitor hemoglobin and transfuse for hemoglobin less than 7.0.  Emphysematous pyelonephritis of left kidney The patient will be started on ceftriaxone  2 gm IV daily. On 07/12/2023 the patient had blood cultures positive for e coli that was pan sensitive. Blood cultures have been ordered.  Essential  hypertension Patient has been hypotensive since presentation. Will hold antihypertensives for now. They include coreg  25 mg bid, zestril  5 mg daily. These will be held until her blood pressures require treatment.  Nausea and vomiting The patient states that she has not been able to eat since she developed her flank pain last night. She has been nauseated and has vomited. Antiemetics will be made available. The patient states that she had a normal bowel movement today.  Sepsis (HCC) The patient had hypotension with a blood pressure of 85/48 in the ED, WBC of 13.7 with a known infectious source in the left kidney - emphysematous pyelonephritis.  Type 2 diabetes mellitus (HCC) Prior to presentation the patient's glucoses were controlled with lantus  7 units bid and humalog  7 units bid. She will also be followed by FSBS and SSI while inpatient.      Advance Care Planning:   Code Status: Full Code   Consults: Nephrology  Family Communication: None available  Severity of Illness: The appropriate patient status for this patient is INPATIENT. Inpatient status is judged to be reasonable and necessary in order to provide the required intensity of service to ensure the patient's safety. The patient's presenting symptoms, physical exam findings, and initial radiographic and laboratory data in the context of their chronic comorbidities is felt to place them at high risk for further clinical deterioration. Furthermore, it is not anticipated that the patient will be medically stable for discharge from the hospital within 2 midnights of admission.   * I certify that at the point of admission it is my clinical judgment that the patient will require inpatient hospital care spanning beyond 2 midnights from the point of admission due to high intensity of service, high risk for further deterioration and high frequency of surveillance required.*  Author: Nevea Spiewak, DO 08/28/2023 10:39 AM  For on call review  [email or web address].

## 2023-08-28 NOTE — Assessment & Plan Note (Signed)
 The patient states that she has not been able to eat since she developed her flank pain last night. She has been nauseated and has vomited. Antiemetics will be made available. The patient states that she had a normal bowel movement today.

## 2023-08-28 NOTE — ED Provider Notes (Signed)
 [facility] Provider Note    Event Date/Time   First MD Initiated Contact with Patient 08/28/23 0321     (approximate)   History   Chief Complaint: Abdominal Pain   HPI  Chele Cornell is a 68 y.o. female with a history of hypertension CHF ESRD on hemodialysis diabetes who was sent to the ED from her SNF due to generalized abdominal pain and vomiting that started tonight after dinner.  Patient reports pain is in the left side of the abdomen and radiates around to her back.  No fever.        Past Medical History:  Diagnosis Date   Anemia    CHF (congestive heart failure) (HCC)    Chronic kidney disease    Coronary artery disease    Diabetes mellitus without complication (HCC)    DISH (diffuse idiopathic skeletal hyperostosis)    GERD (gastroesophageal reflux disease)    Hyperkalemia    Hypertension    Psoriasis    Psoriasis     Current Outpatient Rx   Order #: [id number] Class: Historical Med   Order #: [id number] Class: Historical Med   Order #: [id number] Class: Historical Med   Order #: [id number] Class: Historical Med   Order #: [id number] Class: Historical Med   Order #: [id number] Class: Historical Med   Order #: [id number] Class: Historical Med   Order #: [id number] Class: No Print   Order #: [id number] Class: Historical Med   Order #: [id number] Class: Normal   Order #: [id number] Class: Historical Med   Order #: [id number] Class: Historical Med   Order #: [id number] Class: Historical Med   Order #: [id number] Class: Print   Order #: [id number] Class: No Print   Order #: [id number] Class: Historical Med   Order #: [id number] Class: No Print   Order #: [id number] Class: Print   Order #: [id number] Class: Historical Med    Past Surgical History:  Procedure Laterality Date   CESAREAN SECTION     2 c-sections   CORONARY ARTERY BYPASS GRAFT     CORONARY STENT PLACEMENT     HERNIA REPAIR     TUBAL LIGATION      Physical Exam   Triage Vital Signs: ED Triage  Vitals  Encounter Vitals Group     BP 08/28/23 0047 (!) 111/50     Girls Systolic BP Percentile --      Girls Diastolic BP Percentile --      Boys Systolic BP Percentile --      Boys Diastolic BP Percentile --      Pulse Rate 08/28/23 0047 80     Resp 08/28/23 0047 20     Temp 08/28/23 0047 98.6 F (37 C)     Temp Source 08/28/23 0047 Oral     SpO2 08/28/23 0047 97 %     Weight --      Height --      Head Circumference --      Peak Flow --      Pain Score 08/28/23 0042 10     Pain Loc --      Pain Education --      Exclude from Growth Chart --     Most recent vital signs: Vitals:   08/28/23 0047  BP: (!) 111/50  Pulse: 80  Resp: 20  Temp: 98.6 F (37 C)  SpO2: 97%    General: Awake, no distress.  CV:  Good peripheral perfusion.  Regular rate rhythm Resp:  Normal effort.  Clear to auscultation bilaterally Abd:  No distention.  Soft with  generalized tenderness.  No peritonitis. Other:  No significant lower extremity edema.  Tunneled right IJ dialysis catheter in place   ED Results / Procedures / Treatments   Labs (all labs ordered are listed, but only abnormal results are displayed) Labs Reviewed  CBC - Abnormal; Notable for the following components:      Result Value   WBC 13.7 (*)    RBC 2.96 (*)    Hemoglobin 9.1 (*)    HCT 28.7 (*)    RDW 16.9 (*)    All other components within normal limits  URINALYSIS, ROUTINE W REFLEX MICROSCOPIC  COMPREHENSIVE METABOLIC PANEL WITH GFR  LIPASE, BLOOD     EKG Interpreted by me Sinus rhythm rate of 80.  Normal axis and intervals.  Poor R wave progression.  No acute ischemic changes   RADIOLOGY CT abdomen pelvis interpreted by me, negative for free air or bowel obstruction.  Radiology report reviewed noting emphysematous pyelonephritis.   PROCEDURES:  Procedures   MEDICATIONS ORDERED IN ED: Medications  cefTRIAXone  (ROCEPHIN ) 2 g in sodium chloride  0.9 % 100 mL IVPB (2 g Intravenous New Bag/Given 08/28/23  0634)  ondansetron  (ZOFRAN ) injection 4 mg (4 mg Intravenous Given 08/28/23 0408)  morphine  (PF) 4 MG/ML injection 4 mg (4 mg Intravenous Given 08/28/23 0407)  HYDROmorphone  (DILAUDID ) injection 1 mg (1 mg Intravenous Given 08/28/23 0634)  sodium chloride  0.9 % bolus 500 mL (500 mLs Intravenous New Bag/Given 08/28/23 9366)     IMPRESSION / MDM / ASSESSMENT AND PLAN / ED COURSE  I reviewed the triage vital signs and the nursing notes.  DDx: Viral illness, dehydration, kidney stone, UTI, diverticulitis, pancreatitis, gastritis  Patient's presentation is most consistent with acute presentation with potential threat to life or bodily function.  Patient presents with generalized abdominal pain and vomiting starting tonight.  Has multiple comorbidities.  Will give medication for pain and nausea, obtain CT and labs. doubt sepsis.   Clinical Course as of 08/28/23 0654  Mon Aug 28, 2023  9348 Awaiting lab results with plan to admit for pyelonephritis. [PS]    Clinical Course User Index [PS] Viviann Pastor, MD     FINAL CLINICAL IMPRESSION(S) / ED DIAGNOSES   Final diagnoses:  Pyelonephritis  ESRD on hemodialysis [facility])     Rx / DC Orders   ED Discharge Orders     None        Note:  This document was prepared using Dragon voice recognition software and may include unintentional dictation errors.   Viviann Pastor, MD 08/28/23 [phone]

## 2023-08-28 NOTE — Assessment & Plan Note (Signed)
 Patient has been hypotensive since presentation. Will hold antihypertensives for now. They include coreg  25 mg bid, zestril  5 mg daily. These will be held until her blood pressures require treatment.

## 2023-08-28 NOTE — ED Notes (Signed)
 Called lab for blood cultures at this time

## 2023-08-28 NOTE — ED Notes (Signed)
 Bp cuff and pulse ox placed on pt at this time. Call light within reach. Pt has no further needs at this time.

## 2023-08-28 NOTE — Plan of Care (Signed)

## 2023-08-28 NOTE — Assessment & Plan Note (Signed)
 Noted. Due to ESRD. Hemoglobin today is 9.1. This is improved from prior hemoglobin on 8.0 obtained on 06/02/2023. Monitor hemoglobin and transfuse for hemoglobin less than 7.0.

## 2023-08-28 NOTE — ED Notes (Signed)
 Per lab unable to collect blood cultures at this time. Will send another person from lab to attempt again. Lab tech made DO aware.

## 2023-08-28 NOTE — Assessment & Plan Note (Signed)
 The patient is chronically on 2L O2 by nasal cannula. Since being in the ED she is requiring 5L in order to maintain saturations in the 90's.

## 2023-08-29 DIAGNOSIS — N12 Tubulo-interstitial nephritis, not specified as acute or chronic: Secondary | ICD-10-CM

## 2023-08-29 LAB — COMPREHENSIVE METABOLIC PANEL WITH GFR
ALT: 7 U/L (ref 0–44)
AST: 12 U/L — ABNORMAL LOW (ref 15–41)
Albumin: 2.5 g/dL — ABNORMAL LOW (ref 3.5–5.0)
Alkaline Phosphatase: 147 U/L — ABNORMAL HIGH (ref 38–126)
Anion gap: 11 (ref 5–15)
BUN: 36 mg/dL — ABNORMAL HIGH (ref 8–23)
CO2: 26 mmol/L (ref 22–32)
Calcium: 8.4 mg/dL — ABNORMAL LOW (ref 8.9–10.3)
Chloride: 96 mmol/L — ABNORMAL LOW (ref 98–111)
Creatinine, Ser: 2.4 mg/dL — ABNORMAL HIGH (ref 0.44–1.00)
GFR, Estimated: 21 mL/min — ABNORMAL LOW (ref >60)
Glucose, Bld: 180 mg/dL — ABNORMAL HIGH (ref 70–99)
Potassium: 4.9 mmol/L (ref 3.5–5.1)
Sodium: 133 mmol/L — ABNORMAL LOW (ref 135–145)
Total Bilirubin: 0.7 mg/dL (ref 0.0–1.2)
Total Protein: 6.3 g/dL — ABNORMAL LOW (ref 6.5–8.1)

## 2023-08-29 LAB — GLUCOSE, CAPILLARY
Glucose-Capillary: 149 mg/dL — ABNORMAL HIGH (ref 70–99)
Glucose-Capillary: 119 mg/dL — ABNORMAL HIGH (ref 70–99)
Glucose-Capillary: 192 mg/dL — ABNORMAL HIGH (ref 70–99)
Glucose-Capillary: 219 mg/dL — ABNORMAL HIGH (ref 70–99)

## 2023-08-29 LAB — PROTIME-INR
INR: 1.3 — ABNORMAL HIGH (ref 0.8–1.2)
Prothrombin Time: 16.6 s — ABNORMAL HIGH (ref 11.4–15.2)

## 2023-08-29 LAB — CBC
HCT: 25.5 % — ABNORMAL LOW (ref 36.0–46.0)
Hemoglobin: 7.9 g/dL — ABNORMAL LOW (ref 12.0–15.0)
MCH: 30.9 pg (ref 26.0–34.0)
MCHC: 31 g/dL (ref 30.0–36.0)
MCV: 99.6 fL (ref 80.0–100.0)
Platelets: 144 K/uL — ABNORMAL LOW (ref 150–400)
RBC: 2.56 MIL/uL — ABNORMAL LOW (ref 3.87–5.11)
RDW: 17.2 % — ABNORMAL HIGH (ref 11.5–15.5)
WBC: 10.3 K/uL (ref 4.0–10.5)
nRBC: 0 % (ref 0.0–0.2)

## 2023-08-29 LAB — CORTISOL-AM, BLOOD: Cortisol - AM: 21 ug/dL (ref 6.7–22.6)

## 2023-08-29 LAB — HEPATITIS B SURFACE ANTIBODY, QUANTITATIVE: Hep B S AB Quant (Post): <3.5 m[IU]/mL — ABNORMAL LOW

## 2023-08-29 MED ORDER — EPOETIN ALFA-EPBX 10000 UNIT/ML IJ SOLN
10000.0000 [IU] | INTRAMUSCULAR | Status: DC
  Administered 2023-08-30: 10000 [IU] via INTRAVENOUS
  Filled 2023-08-29 (×2): qty 2

## 2023-08-29 MED ORDER — FLUTICASONE PROPIONATE 50 MCG/ACT NA SUSP
1.0000 | Freq: Every day | NASAL | Status: DC
  Administered 2023-08-29 – 2023-09-01 (×4): 1 via NASAL
  Filled 2023-08-29 (×2): qty 16

## 2023-08-29 MED ORDER — CARVEDILOL 6.25 MG PO TABS
3.1250 mg | ORAL_TABLET | Freq: Two times a day (BID) | ORAL | Status: DC
  Administered 2023-08-30 – 2023-09-01 (×5): 3.125 mg via ORAL
  Filled 2023-08-29 (×5): qty 1

## 2023-08-29 MED ORDER — INSULIN GLARGINE-YFGN 100 UNIT/ML ~~LOC~~ SOPN
5.0000 [IU] | PEN_INJECTOR | Freq: Every day | SUBCUTANEOUS | Status: DC
  Filled 2023-08-29: qty 3

## 2023-08-29 MED ORDER — ALBUMIN HUMAN 25 % IV SOLN
50.0000 g | Freq: Once | INTRAVENOUS | Status: AC
  Administered 2023-08-29: 50 g via INTRAVENOUS
  Filled 2023-08-29: qty 200

## 2023-08-29 MED ORDER — DM-GUAIFENESIN ER 30-600 MG PO TB12
1.0000 | ORAL_TABLET | Freq: Two times a day (BID) | ORAL | Status: AC
  Administered 2023-08-29 – 2023-08-30 (×4): 1 via ORAL
  Filled 2023-08-29 (×4): qty 1

## 2023-08-29 MED ORDER — INSULIN GLARGINE-YFGN 100 UNIT/ML ~~LOC~~ SOLN
5.0000 [IU] | Freq: Every day | SUBCUTANEOUS | Status: DC
  Administered 2023-08-29 – 2023-08-31 (×3): 5 [IU] via SUBCUTANEOUS
  Filled 2023-08-29 (×4): qty 0.05

## 2023-08-29 NOTE — Progress Notes (Signed)
 PT Cancellation Note  Patient Details Name: Katrina Ortiz MRN: [id number] DOB: [birth year]   Cancelled Treatment:    Reason Eval/Treat Not Completed: Other (comment) Pt is from LTC at [facility], at baseline uses [place] lift to transfer to [facility] does not ambulate at baseline. PT to sign off this date, consult if status changes.    Carmell Elgin Romero-Perozo, SPT  08/29/2023, 1:24 PM

## 2023-08-29 NOTE — Plan of Care (Signed)

## 2023-08-29 NOTE — Progress Notes (Signed)
 [facility]  ROUNDING NOTE   Subjective:   Patient seen laying in bed Alert Eating breakfast Weaning oxygen, 1L   Objective:  Vital signs in last 24 hours:  Temp:  [97.7 F (36.5 C)-99.5 F (37.5 C)] 99.5 F (37.5 C) (07/08 0729) Pulse Rate:  [70-92] 79 (07/08 0729) Resp:  [9-18] 18 (07/08 0729) BP: (95-151)/(34-96) 109/38 (07/08 0729) SpO2:  [81 %-100 %] 100 % (07/08 0729) Weight:  [94.3 kg-95.3 kg] 94.3 kg (07/07 1640)  Weight change:  Filed Weights   08/28/23 1316 08/28/23 1640  Weight: 95.3 kg 94.3 kg    Intake/Output: I/O last 3 completed shifts: In: 1100 [IV Piggyback:1100] Out: 1000 [Other:1000]   Intake/Output this shift:  No intake/output data recorded.  Physical Exam: General: NAD  Head: Normocephalic, atraumatic. Moist oral mucosal membranes  Eyes: Anicteric  Neck: Supple  Lungs:  Clear to auscultation  Heart: Regular rate and rhythm  Abdomen:  Soft, nontender  Extremities:  peripheral edema.  Neurologic: Awake, alert, conversant  Skin: Warm,dry, no rash  Access: Rt permcath    Basic Metabolic Panel: Recent Labs  Lab 08/28/23 0631 08/29/23 0403  NA 132* 133*  K 5.8* 4.9  CL 96* 96*  CO2 21* 26  GLUCOSE 106* 180*  BUN 63* 36*  CREATININE 3.36* 2.40*  CALCIUM  8.4* 8.4*    Liver Function Tests: Recent Labs  Lab 08/28/23 0631 08/29/23 0403  AST 19 12*  ALT 8 7  ALKPHOS 164* 147*  BILITOT 1.0 0.7  PROT 6.1* 6.3*  ALBUMIN  2.7* 2.5*   Recent Labs  Lab 08/28/23 0631  LIPASE 22   No results for input(s): AMMONIA in the last 168 hours.  CBC: Recent Labs  Lab 08/28/23 0130 08/28/23 1036 08/29/23 0403  WBC 13.7* 14.3* 10.3  HGB 9.1* 8.9* 7.9*  HCT 28.7* 28.4* 25.5*  MCV 97.0 98.6 99.6  PLT 202 192 144*    Cardiac Enzymes: No results for input(s): CKTOTAL, CKMB, CKMBINDEX, TROPONINI in the last 168 hours.  BNP: Invalid input(s): POCBNP  CBG: Recent Labs  Lab 08/28/23 1802 08/28/23 2123  08/29/23 0736 08/29/23 1118  GLUCAP 115* 227* 149* 119*    Microbiology: Results for orders placed or performed during the hospital encounter of 08/28/23  Culture, blood (Routine X 2) w Reflex to ID Panel     Status: None (Preliminary result)   Collection Time: 08/28/23  6:31 PM   Specimen: BLOOD  Result Value Ref Range Status   Specimen Description BLOOD BLOOD LEFT ARM  Final   Special Requests   Final    BOTTLES DRAWN AEROBIC AND ANAEROBIC Blood Culture results may not be optimal due to an inadequate volume of blood received in culture bottles   Culture   Final    NO GROWTH < 12 HOURS Performed at [facility], [address], [place], [place] [address]    Report Status PENDING  Incomplete  Culture, blood (Routine X 2) w Reflex to ID Panel     Status: None (Preliminary result)   Collection Time: 08/28/23  8:27 PM   Specimen: BLOOD  Result Value Ref Range Status   Specimen Description BLOOD BLOOD LEFT ARM LAC  Final   Special Requests   Final    BOTTLES DRAWN AEROBIC AND ANAEROBIC Blood Culture results may not be optimal due to an inadequate volume of blood received in culture bottles   Culture   Final    NO GROWTH < 12 HOURS Performed at [facility], [address] [place], [place] [address]    Report Status PENDING  Incomplete    Coagulation Studies: Recent Labs    08/29/23 0403  LABPROT 16.6*  INR 1.3*    Urinalysis: No results for input(s): COLORURINE, LABSPEC, PHURINE, GLUCOSEU, HGBUR, BILIRUBINUR, KETONESUR, PROTEINUR, UROBILINOGEN, NITRITE, LEUKOCYTESUR in the last 72 hours.  Invalid input(s): APPERANCEUR    Imaging: DG Chest Port 1 View Result Date: 08/28/2023 CLINICAL DATA:  Hypoxia. EXAM: PORTABLE CHEST 1 VIEW COMPARISON:  Same day radiographs dated 08/28/2023 at 10:07 a.m. FINDINGS: The heart size and mediastinal contours are unchanged. Prior median sternotomy and CABG. Aortic atherosclerosis. Stable dialysis  catheter tip at the superior cavoatrial junction. Central pulmonary vascular congestion. No overt edema, focal consolidation, sizeable pleural effusion, or pneumothorax. Visualized osseous structures are unchanged. IMPRESSION: No significant change compared to the prior exam. Cardiomegaly with central pulmonary vascular congestion. Electronically Signed   By: Harrietta Sherry M.D.   On: 08/28/2023 12:15   Portable chest 1 View Result Date: 08/28/2023 CLINICAL DATA:  Hypoxia. EXAM: PORTABLE CHEST 1 VIEW COMPARISON:  Chest radiograph dated 04/13/2018 FINDINGS: Dialysis catheter with tip at the cavoatrial junction. There is cardiomegaly with vascular congestion. No focal consolidation, pleural effusion or pneumothorax. Median sternotomy wires. No acute osseous pathology. IMPRESSION: Cardiomegaly with vascular congestion. Electronically Signed   By: Vanetta Chou M.D.   On: 08/28/2023 10:17   CT ABDOMEN PELVIS WO CONTRAST Result Date: 08/28/2023 CLINICAL DATA:  Abdominal pain in the left lower quadrant. Vomiting and diarrhea. EXAM: CT ABDOMEN AND PELVIS WITHOUT CONTRAST TECHNIQUE: Multidetector CT imaging of the abdomen and pelvis was performed following the standard protocol without IV contrast. RADIATION DOSE REDUCTION: This exam was performed according to the departmental dose-optimization program which includes automated exposure control, adjustment of the mA and/or kV according to patient size and/or use of iterative reconstruction technique. COMPARISON:  06/02/2023 FINDINGS: Lower chest: Dependent collapse/consolidation with stable small volume bilateral dependent chronic pleural thickening/fluid. Hepatobiliary: No suspicious focal abnormality in the liver on this study without intravenous contrast. Tiny gallstones evident. No intrahepatic or extrahepatic biliary dilation. Pancreas: Pancreas is diffusely atrophic without main duct dilatation. Spleen: No splenomegaly. No suspicious focal mass lesion.  Adrenals/Urinary Tract: No adrenal nodule or mass. Cortical thinning noted right kidney without evidence for hydroureteronephrosis. Gas is noted in the intrarenal collecting system of the left kidney with mild fullness of the left intrarenal collecting system and ureter. Gas is visible in the urinary bladder. Stomach/Bowel: Tiny hiatal hernia. Stomach otherwise unremarkable. Duodenum is normally positioned as is the ligament of Treitz. No small bowel wall thickening. No small bowel dilatation. The terminal ileum is normal. The appendix is normal. No gross colonic mass. No colonic wall thickening. Vascular/Lymphatic: There is moderate atherosclerotic calcification of the abdominal aorta without aneurysm. There is no gastrohepatic or hepatoduodenal ligament lymphadenopathy. No retroperitoneal or mesenteric lymphadenopathy. No pelvic sidewall lymphadenopathy. Reproductive: There is no adnexal mass. Other: No intraperitoneal free fluid. Musculoskeletal: No worrisome lytic or sclerotic osseous abnormality. IMPRESSION: 1. Gas in the intrarenal collecting system of the left kidney with mild fullness of the left intrarenal collecting system and ureter. Gas is visible in the urinary bladder. 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 2. Cholelithiasis. 3. Tiny hiatal hernia. 4. Stable small volume bilateral dependent chronic pleural thickening/fluid with dependent collapse/consolidation in the lung bases. 5. Aortic Atherosclerosis (ICD10-I70.0) and Emphysema (ICD10-J43.9). Electronically Signed   By: Camellia Minus HERO.D.  On: 08/28/2023 05:28     Medications:    cefTRIAXone  (ROCEPHIN )  IV      aspirin  EC  81 mg Oral Daily   carvedilol   25 mg Oral BID   Chlorhexidine  Gluconate Cloth  6 each Topical Q0600   dextromethorphan -guaiFENesin   1 tablet Oral BID   fluticasone   1 spray Each Nare Daily   gabapentin   300 mg Oral QHS   heparin   5,000 Units  Subcutaneous Q8H   insulin  aspart  0-15 Units Subcutaneous TID WC   insulin  glargine-yfgn  7 Units Subcutaneous BID   naLOXone  (NARCAN )  injection  0.4 mg Intravenous Once   pantoprazole   40 mg Oral Daily   polyethylene glycol  17 g Oral Daily   pravastatin   80 mg Oral Daily   senna-docusate  1 tablet Oral Daily   sodium zirconium cyclosilicate   10 g Oral Daily   acetaminophen  **OR** acetaminophen , ondansetron  **OR** ondansetron  (ZOFRAN ) IV, oxyCODONE , sorbitol   Assessment/ Plan:  Katrina Ortiz is a 68 y.o.  female with past medical history of diabetes, hypertension, CAD, GERD, and end stage renal disease on HD, who was admitted to [facility] on 08/28/2023 for Pyelonephritis [N12] ESRD on hemodialysis (HCC) [N18.6, Z99.2] Sepsis (HCC) [A41.9]   End-stage renal disease on hemodialysis. Last treatment completed on Friday. Patient will receive dialysis today, UF goal 1.5 L as tolerated. Next treatment scheduled for Wednesday. Will maintain MWF schedule during this admission.   2. Anemia of chronic kidney disease Lab Results  Component Value Date   HGB 7.9 (L) 08/29/2023    Will order Retacrit  10000 units IV with dialysis.   3. Secondary Hyperparathyroidism: with outpatient labs: Unavailable  Lab Results  Component Value Date   CALCIUM  8.4 (L) 08/29/2023   PHOS 3.1 04/13/2019    Calcium  acceptable for this patient.   4.  Hypertension with chronic kidney disease.  Home regimen includes carvedilol , Bumex, furosemide , hydralazine , lisinopril .    LOS: 1 Solon Alban 7/8/202512:38 PM

## 2023-08-29 NOTE — Plan of Care (Signed)
   Problem: Education: Goal: Knowledge of General Education information will improve Description Including pain rating scale, medication(s)/side effects and non-pharmacologic comfort measures Outcome: Progressing

## 2023-08-29 NOTE — Plan of Care (Signed)
  Problem: Respiratory: Goal: Ability to maintain adequate ventilation will improve Outcome: Progressing   Problem: Education: Goal: Knowledge of General Education information will improve Description: Including pain rating scale, medication(s)/side effects and non-pharmacologic comfort measures Outcome: Progressing   Problem: Activity: Goal: Risk for activity intolerance will decrease Outcome: Progressing   Problem: Nutrition: Goal: Adequate nutrition will be maintained Outcome: Progressing   Problem: Coping: Goal: Level of anxiety will decrease Outcome: Progressing   Problem: Elimination: Goal: Will not experience complications related to bowel motility Outcome: Progressing Goal: Will not experience complications related to urinary retention Outcome: Progressing   Problem: Pain Managment: Goal: General experience of comfort will improve and/or be controlled Outcome: Progressing   Problem: Safety: Goal: Ability to remain free from injury will improve Outcome: Progressing

## 2023-08-29 NOTE — Progress Notes (Signed)
 OT Cancellation Note  Patient Details Name: Katrina Ortiz MRN: [id number] DOB: 1955/12/31   Cancelled Treatment:    Reason Eval/Treat Not Completed: OT screened, no needs identified, will sign off. Order received, chart reviewed.  Per chart pt is a resident of a long term care facility. She reports being mostly bedridden and requires total A for ADLs except self feeding where she just requires set up.  No skilled OT needs identified, at baseline. Will sign off. Please re-consult if additional needs arise.   Elston Slot, M.S. OTR/L  08/29/23, 1:59 PM  ascom [phone]

## 2023-08-29 NOTE — Progress Notes (Addendum)
 Progress Note   Patient: Katrina Ortiz FMW:[id number] DOB: [birth year] DOA: 08/28/2023     1 DOS: the patient was seen and examined on 08/29/2023   Brief hospital course: Per H&P HPI   Katrina Ortiz is a 68 y.o. female with medical history significant for DM II, Chronic hypoxic respiratory failure (2L), ESRD-HD, AOCD, HTN, CAD s/p CABG, Diffuse idiopathic skeletal hyperostosis, GERD.   The patient presented to [facility] ED on 08/28/2023 after she developed severe left flank pain last night. She states that she has had chills, but no fevers that she has noticed. She states that she has had some loose bowel movements. She states that she is nauseated. She is on dialysis and makes very little urine.   The patient states that she had dialysis last Friday and that it was a normal dialysis for her. Nephrology has been consulted, and she is set up for dialysis later today, if they can fit her into the schedule.   In the ED the patient was found to be hypotensive at 85/48, and hypoxic on her usual 2L. She is currently requiring 5L in order to maintain saturations in the 90's.    CT abdomen and pelvis has demonstrated emphysematous pyelonephritis on the left as well as cholelithiasis. The scan also captured the base of her lungs which demonstrated chronic stable small volume bilateral dependent pleural thickening with dependent collapse and consolidation in the lung base.    Blood cultures x 2 have been ordered. The patient will receive Ceftriaxone  2 gm IV daily. The patient had e coli bacteremia in May of this year. It was pan sensitive.   Assessment and Plan: Emphysematous pyelonephritis Sepsis POA, resolved  Patient with hypotension, leukocytosis which is now improved, and noninfectious source of left kidney emphysematous pyelonephritis.  Patient remains afebrile.      Latest Ref Rng & Units 08/29/2023    4:03 AM 08/28/2023   10:36 AM 08/28/2023    1:30 AM  CBC  WBC 4.0 - 10.5 K/uL 10.3  14.3  13.7    Hemoglobin 12.0 - 15.0 g/dL 7.9  8.9  9.1   Hematocrit 36.0 - 46.0 % 25.5  28.4  28.7   Platelets 150 - 400 K/uL 144  192  202   - Continue ceftriaxone    Acute on chronic hypoxic respiratory failure Resolved On 2L [place] at home. 05/2023 pulmonology notes: Patient's PFTs suggestive of restrictive process.  Also noted to have decreased DLCO, which was thought to be due to volume overload.  She was to be reevaluated once volume status better managed.  Anemia of chronic kidney disease Hemoglobin down 1 point this a.m. No obvious bleeding.  -F/u iron panel  -ESA per nephrology    HTN On coreg  and zestril  Will hold zestril  given low blood pressures   T2DM A1c 5.3 a year ago.Possibly artificially decreased in the setting of ESRD.   SSI with CBGs. Resume home insulin  if BG uncontrolled.   Isolated Elevated ALP H/o Vitamin D  deficiency  -F/u Vitamin D , will replace as needed.   CAD s/p CABG Denies chest pain. Continue aspirin  and statin Continue Coreg  at reduced dose, titrate to home dose as blood pressure allows  GERD noted  Class 2 obesity  BMI 36 noted      Subjective: Pain in L side of stomach around to the back when came in. Spasms in back at HD. Much improved. Patient makes urine. No burning with urination. Has had back spasms before in same area.  Physical Exam: Vitals:   08/29/23 0428 08/29/23 0515 08/29/23 0729 08/29/23 0800  BP: (!) 95/52 (!) 113/48 (!) 109/38   Pulse: 79 78 79   Resp: 16 18 18    Temp: 98.3 F (36.8 C)  99.5 F (37.5 C)   TempSrc:   Oral   SpO2: 96% 100% 100%   Weight:      Height:    5' 3 (1.6 m)   Physical Exam  Constitutional: In no distress.  Cardiovascular: Normal rate, regular rhythm. No lower extremity edema  Pulmonary: Non labored breathing on room air, no wheezing Rales in RLL Abdominal: Soft. Normal bowel sounds. Non distended and non tender Musculoskeletal: Normal range of motion.     Neurological: Alert and oriented to  person, place, and time. Non focal  Skin: Skin is warm and dry.   Data Reviewed:      Latest Ref Rng & Units 08/29/2023    4:03 AM 08/28/2023    6:31 AM 06/02/2023    6:03 PM  BMP  Glucose 70 - 99 mg/dL 819  893  595   BUN 8 - 23 mg/dL 36  63  55   Creatinine 0.44 - 1.00 mg/dL 7.59  6.63  8.14   Sodium 135 - 145 mmol/L 133  132  132   Potassium 3.5 - 5.1 mmol/L 4.9  5.8  3.7   Chloride 98 - 111 mmol/L 96  96  94   CO2 22 - 32 mmol/L 26  21  25    Calcium  8.9 - 10.3 mg/dL 8.4  8.4  8.5       Latest Ref Rng & Units 08/29/2023    4:03 AM 08/28/2023   10:36 AM 08/28/2023    1:30 AM  CBC  WBC 4.0 - 10.5 K/uL 10.3  14.3  13.7   Hemoglobin 12.0 - 15.0 g/dL 7.9  8.9  9.1   Hematocrit 36.0 - 46.0 % 25.5  28.4  28.7   Platelets 150 - 400 K/uL 144  192  202      Family Communication: Discussed plan with patient.  She verbalized understanding of the plan.  Disposition: Status is: Inpatient Remains inpatient appropriate because: IV antibiotics  Planned Discharge Destination: Home    Time spent: 35 minutes  Author: Alban Pepper, MD 08/29/2023 11:55 AM  For on call review [email or web address].

## 2023-08-29 NOTE — Hospital Course (Signed)
 Call son Katrina Ortiz

## 2023-08-30 DIAGNOSIS — N10 Acute tubulo-interstitial nephritis: Secondary | ICD-10-CM | POA: Diagnosis not present

## 2023-08-30 LAB — GLUCOSE, CAPILLARY
Glucose-Capillary: 146 mg/dL — ABNORMAL HIGH (ref 70–99)
Glucose-Capillary: 118 mg/dL — ABNORMAL HIGH (ref 70–99)
Glucose-Capillary: 144 mg/dL — ABNORMAL HIGH (ref 70–99)

## 2023-08-30 LAB — BASIC METABOLIC PANEL WITH GFR
Anion gap: 14 (ref 5–15)
BUN: 50 mg/dL — ABNORMAL HIGH (ref 8–23)
CO2: 24 mmol/L (ref 22–32)
Calcium: 7.8 mg/dL — ABNORMAL LOW (ref 8.9–10.3)
Chloride: 94 mmol/L — ABNORMAL LOW (ref 98–111)
Creatinine, Ser: 3.37 mg/dL — ABNORMAL HIGH (ref 0.44–1.00)
GFR, Estimated: 14 mL/min — ABNORMAL LOW (ref >60)
Glucose, Bld: 146 mg/dL — ABNORMAL HIGH (ref 70–99)
Potassium: 4.9 mmol/L (ref 3.5–5.1)
Sodium: 132 mmol/L — ABNORMAL LOW (ref 135–145)

## 2023-08-30 LAB — IRON AND TIBC
Iron: 18 ug/dL — ABNORMAL LOW (ref 28–170)
Saturation Ratios: 17 % (ref 10.4–31.8)
TIBC: 108 ug/dL — ABNORMAL LOW (ref 250–450)
UIBC: 90 ug/dL

## 2023-08-30 LAB — CBC
HCT: 21.6 % — ABNORMAL LOW (ref 36.0–46.0)
Hemoglobin: 6.7 g/dL — ABNORMAL LOW (ref 12.0–15.0)
MCH: 30.5 pg (ref 26.0–34.0)
MCHC: 31 g/dL (ref 30.0–36.0)
MCV: 98.2 fL (ref 80.0–100.0)
Platelets: 129 K/uL — ABNORMAL LOW (ref 150–400)
RBC: 2.2 MIL/uL — ABNORMAL LOW (ref 3.87–5.11)
RDW: 16.4 % — ABNORMAL HIGH (ref 11.5–15.5)
WBC: 7.7 K/uL (ref 4.0–10.5)
nRBC: 0 % (ref 0.0–0.2)

## 2023-08-30 LAB — HEMOGLOBIN AND HEMATOCRIT, BLOOD
HCT: 24.5 % — ABNORMAL LOW (ref 36.0–46.0)
Hemoglobin: 7.9 g/dL — ABNORMAL LOW (ref 12.0–15.0)

## 2023-08-30 LAB — ABO/RH: ABO/RH(D): A POS

## 2023-08-30 LAB — HEMOGLOBIN A1C
Hgb A1c MFr Bld: 6.7 % — ABNORMAL HIGH (ref 4.8–5.6)
Mean Plasma Glucose: 146 mg/dL

## 2023-08-30 LAB — PREPARE RBC (CROSSMATCH)

## 2023-08-30 LAB — VITAMIN D 25 HYDROXY (VIT D DEFICIENCY, FRACTURES): Vit D, 25-Hydroxy: 23.92 ng/mL — ABNORMAL LOW (ref 30–100)

## 2023-08-30 LAB — FERRITIN: Ferritin: 769 ng/mL — ABNORMAL HIGH (ref 11–307)

## 2023-08-30 MED ORDER — SODIUM CHLORIDE 0.9% IV SOLUTION: Freq: Once | INTRAVENOUS | Status: AC

## 2023-08-30 MED ORDER — EPOETIN ALFA-EPBX 10000 UNIT/ML IJ SOLN
INTRAMUSCULAR | Status: AC
  Filled 2023-08-30: qty 1

## 2023-08-30 NOTE — Progress Notes (Signed)
 PROGRESS NOTE    Katrina Ortiz  FMW:[id number] DOB: 22-Nov-1955 DOA: 08/28/2023 PCP: Relda Heron LABOR, MD    Assessment & Plan:   Principal Problem:   Sepsis [facility]) Active Problems:   Acute on chronic hypoxic respiratory failure (HCC)   Type 2 diabetes mellitus (HCC)   Nausea and vomiting   Coronary artery disease   Anemia of chronic kidney failure, stage 4 (severe) (HCC)   Essential hypertension   Emphysematous pyelonephritis of left kidney  Assessment and Plan: Emphysematous pyelonephritis: continue on IV rocephin  x 6 days.   Sepsis: POA, resolved. See Dr. Debi note on how pt met sepsis criteria.   Acute on chronic hypoxic respiratory failure: back on baseline oxygen of 2L [place]. PFTs suggestive of restrictive process.   ACD: will transfuse 1 unit of pRBCs. Repeat H&H ordered   HTN: continue on home dose of coreg   DM2: well controlled. Continue on glargine, SSI w/ accuchecks   Elevated alkaline phosphatase: trending down. Will continue to monitor    Hx of CAD: s/p CAB. Continue on aspirin , statin, coreg    GERD: continue on PPI  Obesity: BMI 35.9. Would benefit from weight loss     DVT prophylaxis: SCDs Code Status: full  Family Communication: Disposition Plan: likely d/c back home  Level of care: Telemetry Medical  Status is: Inpatient Remains inpatient appropriate because: severity of illness    Consultants:    Procedures:   Antimicrobials: rocephin     Subjective: Pt c/o malaise   Objective: Vitals:   08/29/23 2047 08/30/23 0423 08/30/23 0532 08/30/23 0817  BP: 114/68 (!) 98/40 (!) 117/51 104/78  Pulse: 65 (!) 58 (!) 59 63  Resp: 16 16  18   Temp: 98.6 F (37 C) 97.6 F (36.4 C)  98 F (36.7 C)  TempSrc: Oral Oral  Oral  SpO2: 97% 100%  96%  Weight:      Height:        Intake/Output Summary (Last 24 hours) at 08/30/2023 1133 Last data filed at 08/29/2023 2153 Gross per 24 hour  Intake 100 ml  Output --  Net 100 ml   Filed  Weights   08/28/23 1316 08/28/23 1640  Weight: 95.3 kg 94.3 kg    Examination:  General exam: Appears calm and comfortable  Respiratory system: Clear to auscultation. Respiratory effort normal. Cardiovascular system: S1 & S2+. No rubs, gallops or clicks.  Gastrointestinal system: Abdomen is obese, soft and nontender. Normal bowel sounds heard. Central nervous system: Alert and oriented. Moves all extremities  Psychiatry: Judgement and insight appear normal. Flat mood and affect    Data Reviewed: I have personally reviewed following labs and imaging studies  CBC: Recent Labs  Lab 08/28/23 0130 08/28/23 1036 08/29/23 0403 08/30/23 0422  WBC 13.7* 14.3* 10.3 7.7  HGB 9.1* 8.9* 7.9* 6.7*  HCT 28.7* 28.4* 25.5* 21.6*  MCV 97.0 98.6 99.6 98.2  PLT 202 192 144* 129*   Basic Metabolic Panel: Recent Labs  Lab 08/28/23 0631 08/29/23 0403 08/30/23 0422  NA 132* 133* 132*  K 5.8* 4.9 4.9  CL 96* 96* 94*  CO2 21* 26 24  GLUCOSE 106* 180* 146*  BUN 63* 36* 50*  CREATININE 3.36* 2.40* 3.37*  CALCIUM  8.4* 8.4* 7.8*   GFR: Estimated Creatinine Clearance: 17.5 mL/min (A) (by C-G formula based on SCr of 3.37 mg/dL (H)). Liver Function Tests: Recent Labs  Lab 08/28/23 0631 08/29/23 0403  AST 19 12*  ALT 8 7  ALKPHOS 164* 147*  BILITOT 1.0  0.7  PROT 6.1* 6.3*  ALBUMIN  2.7* 2.5*   Recent Labs  Lab 08/28/23 0631  LIPASE 22   No results for input(s): AMMONIA in the last 168 hours. Coagulation Profile: Recent Labs  Lab 08/29/23 0403  INR 1.3*   Cardiac Enzymes: No results for input(s): CKTOTAL, CKMB, CKMBINDEX, TROPONINI in the last 168 hours. BNP (last 3 results) No results for input(s): PROBNP in the last 8760 hours. HbA1C: No results for input(s): HGBA1C in the last 72 hours. CBG: Recent Labs  Lab 08/29/23 0736 08/29/23 1118 08/29/23 1603 08/29/23 2049 08/30/23 0817  GLUCAP 149* 119* 192* 219* 146*   Lipid Profile: No results for  input(s): CHOL, HDL, LDLCALC, TRIG, CHOLHDL, LDLDIRECT in the last 72 hours. Thyroid Function Tests: No results for input(s): TSH, T4TOTAL, FREET4, T3FREE, THYROIDAB in the last 72 hours. Anemia Panel: Recent Labs    08/30/23 0422  FERRITIN 769*  TIBC 108*  IRON 18*   Sepsis Labs: Recent Labs  Lab 08/28/23 1036 08/28/23 1831  LATICACIDVEN 1.0 1.1    Recent Results (from the past 240 hours)  Culture, blood (Routine X 2) w Reflex to ID Panel     Status: None (Preliminary result)   Collection Time: 08/28/23  6:31 PM   Specimen: BLOOD  Result Value Ref Range Status   Specimen Description BLOOD BLOOD LEFT ARM  Final   Special Requests   Final    BOTTLES DRAWN AEROBIC AND ANAEROBIC Blood Culture results may not be optimal due to an inadequate volume of blood received in culture bottles   Culture   Final    NO GROWTH 2 DAYS Performed at [facility], [address], [place], [place] [address]    Report Status PENDING  Incomplete  Culture, blood (Routine X 2) w Reflex to ID Panel     Status: None (Preliminary result)   Collection Time: 08/28/23  8:27 PM   Specimen: BLOOD  Result Value Ref Range Status   Specimen Description BLOOD BLOOD LEFT ARM LAC  Final   Special Requests   Final    BOTTLES DRAWN AEROBIC AND ANAEROBIC Blood Culture results may not be optimal due to an inadequate volume of blood received in culture bottles   Culture   Final    NO GROWTH 2 DAYS Performed at [facility], [address] [place], [place] [address]    Report Status PENDING  Incomplete         Radiology Studies: DG Chest Port 1 View Result Date: 08/28/2023 CLINICAL DATA:  Hypoxia. EXAM: PORTABLE CHEST 1 VIEW COMPARISON:  Same day radiographs dated 08/28/2023 at 10:07 a.m. FINDINGS: The heart size and mediastinal contours are unchanged. Prior median sternotomy and CABG. Aortic atherosclerosis. Stable dialysis catheter tip at the superior cavoatrial  junction. Central pulmonary vascular congestion. No overt edema, focal consolidation, sizeable pleural effusion, or pneumothorax. Visualized osseous structures are unchanged. IMPRESSION: No significant change compared to the prior exam. Cardiomegaly with central pulmonary vascular congestion. Electronically Signed   By: Harrietta Sherry M.D.   On: 08/28/2023 12:15        Scheduled Meds:  aspirin  EC  81 mg Oral Daily   carvedilol   3.125 mg Oral BID   Chlorhexidine  Gluconate Cloth  6 each Topical Q0600   dextromethorphan -guaiFENesin   1 tablet Oral BID   epoetin  alfa-epbx (RETACRIT ) injection  10,000 Units Intravenous Q M,W,F-1800   fluticasone   1 spray Each Nare Daily   gabapentin   300 mg Oral QHS  heparin   5,000 Units Subcutaneous Q8H   insulin  aspart  0-15 Units Subcutaneous TID WC   insulin  glargine-yfgn  5 Units Subcutaneous Q2200   naLOXone  (NARCAN )  injection  0.4 mg Intravenous Once   pantoprazole   40 mg Oral Daily   polyethylene glycol  17 g Oral Daily   pravastatin   80 mg Oral Daily   senna-docusate  1 tablet Oral Daily   sodium zirconium cyclosilicate   10 g Oral Daily   Continuous Infusions:  cefTRIAXone  (ROCEPHIN )  IV 2 g (08/30/23 0859)     LOS: 2 days       Anthony CHRISTELLA Pouch, MD [facility] Pager [phone]  If 7PM-7AM, please contact night-coverage [email or web address] 08/30/2023, 11:33 AM

## 2023-08-30 NOTE — Care Management Important Message (Signed)
 Important Message  Patient Details  Name: Katrina Ortiz MRN: [id number] Date of Birth: 04/21/1955   Important Message Given:  Yes - Medicare IM     Rojelio [name] 08/30/2023, 11:55 AM

## 2023-08-30 NOTE — Plan of Care (Signed)

## 2023-08-30 NOTE — Progress Notes (Signed)
  Received patient in bed to unit.   Informed consent signed and in chart.    TX duration:3.50     Transported by  Hand-off given to patient's nurse. Pt tolerated tx well.   Access used: left cath Access issues: none   Total UF removed: 2.kg Medication(s) given: Epo 10,000 Post HD VS: wnl Post HD weight: 89.4 kg     N. Cahlil Sattar LPN [facility]

## 2023-08-30 NOTE — Progress Notes (Addendum)
 [facility]  ROUNDING NOTE   Subjective:   Patient Seen sitting up in bed Eating breakfast Denies pain  [name]  Objective:  Vital signs in last 24 hours:  Temp:  [97.6 F (36.4 C)-98.6 F (37 C)] 98.4 F (36.9 C) (07/09 1520) Pulse Rate:  [58-65] 65 (07/09 1630) Resp:  [12-18] 16 (07/09 1630) BP: (98-156)/(40-89) 156/89 (07/09 1630) SpO2:  [96 %-100 %] 100 % (07/09 1630) Weight:  [92 kg] 92 kg (07/09 1520)  Weight change:  Filed Weights   08/28/23 1316 08/28/23 1640 08/30/23 1520  Weight: 95.3 kg 94.3 kg 92 kg    Intake/Output: I/O last 3 completed shifts: In: 100 [IV Piggyback:100] Out: -    Intake/Output this shift:  No intake/output data recorded.  Physical Exam: General: NAD  Head: Normocephalic, atraumatic. Moist oral mucosal membranes  Eyes: Anicteric  Neck: Supple  Lungs:  Clear to auscultation  Heart: Regular rate and rhythm  Abdomen:  Soft, nontender  Extremities:  No peripheral edema.  Neurologic: Awake, alert, conversant  Skin: Warm,dry, no rash  Access: Rt permcath    Basic Metabolic Panel: Recent Labs  Lab 08/28/23 0631 08/29/23 0403 08/30/23 0422  NA 132* 133* 132*  K 5.8* 4.9 4.9  CL 96* 96* 94*  CO2 21* 26 24  GLUCOSE 106* 180* 146*  BUN 63* 36* 50*  CREATININE 3.36* 2.40* 3.37*  CALCIUM  8.4* 8.4* 7.8*    Liver Function Tests: Recent Labs  Lab 08/28/23 0631 08/29/23 0403  AST 19 12*  ALT 8 7  ALKPHOS 164* 147*  BILITOT 1.0 0.7  PROT 6.1* 6.3*  ALBUMIN  2.7* 2.5*   Recent Labs  Lab 08/28/23 0631  LIPASE 22   No results for input(s): AMMONIA in the last 168 hours.  CBC: Recent Labs  Lab 08/28/23 0130 08/28/23 1036 08/29/23 0403 08/30/23 0422  WBC 13.7* 14.3* 10.3 7.7  HGB 9.1* 8.9* 7.9* 6.7*  HCT 28.7* 28.4* 25.5* 21.6*  MCV 97.0 98.6 99.6 98.2  PLT 202 192 144* 129*    Cardiac Enzymes: No results for input(s): CKTOTAL, CKMB, CKMBINDEX, TROPONINI in the last 168  hours.  BNP: Invalid input(s): POCBNP  CBG: Recent Labs  Lab 08/29/23 1118 08/29/23 1603 08/29/23 2049 08/30/23 0817 08/30/23 1212  GLUCAP 119* 192* 219* 146* 118*    Microbiology: Results for orders placed or performed during the hospital encounter of 08/28/23  Culture, blood (Routine X 2) w Reflex to ID Panel     Status: None (Preliminary result)   Collection Time: 08/28/23  6:31 PM   Specimen: BLOOD  Result Value Ref Range Status   Specimen Description BLOOD BLOOD LEFT ARM  Final   Special Requests   Final    BOTTLES DRAWN AEROBIC AND ANAEROBIC Blood Culture results may not be optimal due to an inadequate volume of blood received in culture bottles   Culture   Final    NO GROWTH 2 DAYS Performed at [facility], [address], [place], [place] [address]    Report Status PENDING  Incomplete  Culture, blood (Routine X 2) w Reflex to ID Panel     Status: None (Preliminary result)   Collection Time: 08/28/23  8:27 PM   Specimen: BLOOD  Result Value Ref Range Status   Specimen Description BLOOD BLOOD LEFT ARM LAC  Final   Special Requests   Final    BOTTLES DRAWN AEROBIC AND ANAEROBIC Blood Culture results may not be optimal due to an inadequate volume  of blood received in culture bottles   Culture   Final    NO GROWTH 2 DAYS Performed at [facility], [address] [place], [place] [address]    Report Status PENDING  Incomplete    Coagulation Studies: Recent Labs    08/29/23 0403  LABPROT 16.6*  INR 1.3*    Urinalysis: No results for input(s): COLORURINE, LABSPEC, PHURINE, GLUCOSEU, HGBUR, BILIRUBINUR, KETONESUR, PROTEINUR, UROBILINOGEN, NITRITE, LEUKOCYTESUR in the last 72 hours.  Invalid input(s): APPERANCEUR    Imaging: No results found.    Medications:    cefTRIAXone  (ROCEPHIN )  IV 2 g (08/30/23 0859)    aspirin  EC  81 mg Oral Daily   carvedilol   3.125 mg Oral BID   Chlorhexidine  Gluconate Cloth   6 each Topical Q0600   dextromethorphan -guaiFENesin   1 tablet Oral BID   epoetin  alfa-epbx (RETACRIT ) injection  10,000 Units Intravenous Q M,W,F-1800   fluticasone   1 spray Each Nare Daily   gabapentin   300 mg Oral QHS   heparin   5,000 Units Subcutaneous Q8H   insulin  aspart  0-15 Units Subcutaneous TID WC   insulin  glargine-yfgn  5 Units Subcutaneous Q2200   naLOXone  (NARCAN )  injection  0.4 mg Intravenous Once   pantoprazole   40 mg Oral Daily   polyethylene glycol  17 g Oral Daily   pravastatin   80 mg Oral Daily   senna-docusate  1 tablet Oral Daily   sodium zirconium cyclosilicate   10 g Oral Daily   acetaminophen  **OR** acetaminophen , ondansetron  **OR** ondansetron  (ZOFRAN ) IV, oxyCODONE , sorbitol   Assessment/ Plan:  [name] is a 68 y.o.  female with past medical history of diabetes, hypertension, CAD, GERD, and end stage renal disease on HD, who was admitted to [facility] on 08/28/2023 for Pyelonephritis [N12] ESRD on hemodialysis (HCC) [N18.6, Z99.2] Sepsis (HCC) [A41.9]   End-stage renal disease on hemodialysis.Will receive dialysis later today.   2. Anemia of chronic kidney disease Lab Results  Component Value Date   HGB 6.7 (L) 08/30/2023    Continue Retacrit  10000 units IV with dialysis. Primary team has ordered 1 unit blood during dialysis.  3. Secondary Hyperparathyroidism: with outpatient labs: Unavailable  Lab Results  Component Value Date   CALCIUM  7.8 (L) 08/30/2023   PHOS 3.1 04/13/2019    Calcium  slightly decreased. Will continue to monitor and consider supplementation.   4.  Hypertension with chronic kidney disease.  Home regimen includes carvedilol , Bumex, furosemide , hydralazine , lisinopril .   Blood pressure 117/51.   LOS: 2 Katrina Ortiz 7/9/20254:46 PM

## 2023-08-31 DIAGNOSIS — N10 Acute tubulo-interstitial nephritis: Secondary | ICD-10-CM | POA: Diagnosis not present

## 2023-08-31 LAB — BASIC METABOLIC PANEL WITH GFR
Anion gap: 13 (ref 5–15)
BUN: 29 mg/dL — ABNORMAL HIGH (ref 8–23)
CO2: 25 mmol/L (ref 22–32)
Calcium: 8 mg/dL — ABNORMAL LOW (ref 8.9–10.3)
Chloride: 94 mmol/L — ABNORMAL LOW (ref 98–111)
Creatinine, Ser: 2.4 mg/dL — ABNORMAL HIGH (ref 0.44–1.00)
GFR, Estimated: 21 mL/min — ABNORMAL LOW (ref >60)
Glucose, Bld: 251 mg/dL — ABNORMAL HIGH (ref 70–99)
Potassium: 3.8 mmol/L (ref 3.5–5.1)
Sodium: 132 mmol/L — ABNORMAL LOW (ref 135–145)

## 2023-08-31 LAB — BPAM RBC
Blood Product Expiration Date: 202508062359
ISSUE DATE / TIME: 202507092321
Unit Type and Rh: 6200

## 2023-08-31 LAB — TYPE AND SCREEN
ABO/RH(D): A POS
Antibody Screen: NEGATIVE
Unit division: 0

## 2023-08-31 LAB — CBC
HCT: 27.3 % — ABNORMAL LOW (ref 36.0–46.0)
Hemoglobin: 8.9 g/dL — ABNORMAL LOW (ref 12.0–15.0)
MCH: 30.4 pg (ref 26.0–34.0)
MCHC: 32.6 g/dL (ref 30.0–36.0)
MCV: 93.2 fL (ref 80.0–100.0)
Platelets: 144 K/uL — ABNORMAL LOW (ref 150–400)
RBC: 2.93 MIL/uL — ABNORMAL LOW (ref 3.87–5.11)
RDW: 16.1 % — ABNORMAL HIGH (ref 11.5–15.5)
WBC: 6.9 K/uL (ref 4.0–10.5)
nRBC: 0 % (ref 0.0–0.2)

## 2023-08-31 LAB — GLUCOSE, CAPILLARY
Glucose-Capillary: 211 mg/dL — ABNORMAL HIGH (ref 70–99)
Glucose-Capillary: 136 mg/dL — ABNORMAL HIGH (ref 70–99)
Glucose-Capillary: 165 mg/dL — ABNORMAL HIGH (ref 70–99)
Glucose-Capillary: 189 mg/dL — ABNORMAL HIGH (ref 70–99)

## 2023-08-31 MED ORDER — DM-GUAIFENESIN ER 30-600 MG PO TB12
1.0000 | ORAL_TABLET | Freq: Two times a day (BID) | ORAL | Status: DC
  Administered 2023-08-31 – 2023-09-01 (×3): 1 via ORAL
  Filled 2023-08-31 (×3): qty 1

## 2023-08-31 NOTE — Progress Notes (Signed)
 [facility]  ROUNDING NOTE   Subjective:   Patient seen sitting up in bed, completed breakfast tray at bedside Remains on 2 L nasal cannula Denies shortness of breath  Objective:  Vital signs in last 24 hours:  Temp:  [97.9 F (36.6 C)-98.5 F (36.9 C)] 97.9 F (36.6 C) (07/10 0800) Pulse Rate:  [63-72] 66 (07/10 0800) Resp:  [12-18] 18 (07/10 0800) BP: (114-172)/(39-89) 169/58 (07/10 0800) SpO2:  [98 %-100 %] 100 % (07/10 0800) Weight:  [89.4 kg-92 kg] 89.4 kg (07/09 1922)  Weight change:  Filed Weights   08/28/23 1640 08/30/23 1520 08/30/23 1922  Weight: 94.3 kg 92 kg 89.4 kg    Intake/Output: I/O last 3 completed shifts: In: 771.8 [P.O.:240; Blood:431.8; IV Piggyback:100] Out: 2000 [Other:2000]   Intake/Output this shift:  No intake/output data recorded.  Physical Exam: General: NAD  Head: Normocephalic, atraumatic. Moist oral mucosal membranes  Eyes: Anicteric  Neck: Supple  Lungs:  Clear to auscultation  Heart: Regular rate and rhythm  Abdomen:  Soft, nontender  Extremities:  No peripheral edema.  Neurologic: Awake, alert, conversant  Skin: Warm,dry, no rash  Access: Rt permcath    Basic Metabolic Panel: Recent Labs  Lab 08/28/23 0631 08/29/23 0403 08/30/23 0422 08/31/23 0541  NA 132* 133* 132* 132*  K 5.8* 4.9 4.9 3.8  CL 96* 96* 94* 94*  CO2 21* 26 24 25   GLUCOSE 106* 180* 146* 251*  BUN 63* 36* 50* 29*  CREATININE 3.36* 2.40* 3.37* 2.40*  CALCIUM  8.4* 8.4* 7.8* 8.0*    Liver Function Tests: Recent Labs  Lab 08/28/23 0631 08/29/23 0403  AST 19 12*  ALT 8 7  ALKPHOS 164* 147*  BILITOT 1.0 0.7  PROT 6.1* 6.3*  ALBUMIN  2.7* 2.5*   Recent Labs  Lab 08/28/23 0631  LIPASE 22   No results for input(s): AMMONIA in the last 168 hours.  CBC: Recent Labs  Lab 08/28/23 0130 08/28/23 1036 08/29/23 0403 08/30/23 0422 08/30/23 2030 08/31/23 0541  WBC 13.7* 14.3* 10.3 7.7  --  6.9  HGB 9.1* 8.9* 7.9* 6.7* 7.9* 8.9*   HCT 28.7* 28.4* 25.5* 21.6* 24.5* 27.3*  MCV 97.0 98.6 99.6 98.2  --  93.2  PLT 202 192 144* 129*  --  144*    Cardiac Enzymes: No results for input(s): CKTOTAL, CKMB, CKMBINDEX, TROPONINI in the last 168 hours.  BNP: Invalid input(s): POCBNP  CBG: Recent Labs  Lab 08/30/23 0817 08/30/23 1212 08/30/23 2213 08/31/23 0801 08/31/23 1159  GLUCAP 146* 118* 144* 211* 136*    Microbiology: Results for orders placed or performed during the hospital encounter of 08/28/23  Culture, blood (Routine X 2) w Reflex to ID Panel     Status: None (Preliminary result)   Collection Time: 08/28/23  6:31 PM   Specimen: BLOOD  Result Value Ref Range Status   Specimen Description BLOOD BLOOD LEFT ARM  Final   Special Requests   Final    BOTTLES DRAWN AEROBIC AND ANAEROBIC Blood Culture results may not be optimal due to an inadequate volume of blood received in culture bottles   Culture   Final    NO GROWTH 2 DAYS Performed at [facility], [address] [place], [place] [address]    Report Status PENDING  Incomplete  Culture, blood (Routine X 2) w Reflex to ID Panel     Status: None (Preliminary result)   Collection Time: 08/28/23  8:27 PM   Specimen: BLOOD  Result Value  Ref Range Status   Specimen Description BLOOD BLOOD LEFT ARM LAC  Final   Special Requests   Final    BOTTLES DRAWN AEROBIC AND ANAEROBIC Blood Culture results may not be optimal due to an inadequate volume of blood received in culture bottles   Culture   Final    NO GROWTH 2 DAYS Performed at [facility], [address] [place], [place] [address]    Report Status PENDING  Incomplete    Coagulation Studies: Recent Labs    08/29/23 0403  LABPROT 16.6*  INR 1.3*    Urinalysis: No results for input(s): COLORURINE, LABSPEC, PHURINE, GLUCOSEU, HGBUR, BILIRUBINUR, KETONESUR, PROTEINUR, UROBILINOGEN, NITRITE, LEUKOCYTESUR in the last 72 hours.  Invalid input(s):  APPERANCEUR    Imaging: No results found.    Medications:    cefTRIAXone  (ROCEPHIN )  IV 2 g (08/31/23 0832)    aspirin  EC  81 mg Oral Daily   carvedilol   3.125 mg Oral BID   Chlorhexidine  Gluconate Cloth  6 each Topical Q0600   dextromethorphan -guaiFENesin   1 tablet Oral BID   epoetin  alfa-epbx (RETACRIT ) injection  10,000 Units Intravenous Q M,W,F-1800   fluticasone   1 spray Each Nare Daily   gabapentin   300 mg Oral QHS   heparin   5,000 Units Subcutaneous Q8H   insulin  aspart  0-15 Units Subcutaneous TID WC   insulin  glargine-yfgn  5 Units Subcutaneous Q2200   naLOXone  (NARCAN )  injection  0.4 mg Intravenous Once   pantoprazole   40 mg Oral Daily   polyethylene glycol  17 g Oral Daily   pravastatin   80 mg Oral Daily   senna-docusate  1 tablet Oral Daily   acetaminophen  **OR** acetaminophen , ondansetron  **OR** ondansetron  (ZOFRAN ) IV, oxyCODONE , sorbitol   Assessment/ Plan:  [name] is a 68 y.o.  female with past medical history of diabetes, hypertension, CAD, GERD, and end stage renal disease on HD, who was admitted to [facility] on 08/28/2023 for Pyelonephritis [N12] ESRD on hemodialysis (HCC) [N18.6, Z99.2] Sepsis (HCC) [A41.9]  [facility]  End-stage renal disease on hemodialysis.patient received dialysis yesterday, UF 2 L achieved.  Next treatment scheduled for Friday.  2. Anemia of chronic kidney disease Lab Results  Component Value Date   HGB 8.9 (L) 08/31/2023    Continue Retacrit  IV with dialysis.  Hemoglobin has improved since blood transfusion on 7/9.  3. Secondary Hyperparathyroidism: with outpatient labs: Unavailable  Lab Results  Component Value Date   CALCIUM  8.0 (L) 08/31/2023   PHOS 3.1 04/13/2019    Calcium  slowly improving.  Will continue to monitor.  Will obtain updated phosphorus level in a.m. with dialysis.  4.  Hypertension with chronic kidney disease.  Home regimen includes carvedilol , Bumex, furosemide , hydralazine , lisinopril .    Currently receiving carvedilol  only.  Blood pressure 169/58.   LOS: 3 Katrina Ortiz 7/10/202512:44 PM

## 2023-08-31 NOTE — Plan of Care (Signed)
  Problem: Education: Goal: Knowledge of General Education information will improve Description: Including pain rating scale, medication(s)/side effects and non-pharmacologic comfort measures Outcome: Progressing   Problem: Activity: Goal: Risk for activity intolerance will decrease Outcome: Progressing   Problem: Nutrition: Goal: Adequate nutrition will be maintained Outcome: Progressing   Problem: Coping: Goal: Level of anxiety will decrease Outcome: Progressing   Problem: Elimination: Goal: Will not experience complications related to bowel motility Outcome: Progressing Goal: Will not experience complications related to urinary retention Outcome: Progressing   Problem: Pain Managment: Goal: General experience of comfort will improve and/or be controlled Outcome: Progressing   Problem: Safety: Goal: Ability to remain free from injury will improve Outcome: Progressing

## 2023-08-31 NOTE — Plan of Care (Signed)
  Problem: Respiratory: Goal: Ability to maintain adequate ventilation will improve Outcome: Progressing   Problem: Education: Goal: Knowledge of General Education information will improve Description: Including pain rating scale, medication(s)/side effects and non-pharmacologic comfort measures Outcome: Progressing   Problem: Activity: Goal: Risk for activity intolerance will decrease Outcome: Progressing   Problem: Nutrition: Goal: Adequate nutrition will be maintained Outcome: Progressing   Problem: Coping: Goal: Level of anxiety will decrease Outcome: Progressing   Problem: Elimination: Goal: Will not experience complications related to bowel motility Outcome: Progressing Goal: Will not experience complications related to urinary retention Outcome: Progressing   Problem: Pain Managment: Goal: General experience of comfort will improve and/or be controlled Outcome: Progressing   Problem: Safety: Goal: Ability to remain free from injury will improve Outcome: Progressing   Problem: Skin Integrity: Goal: Risk for impaired skin integrity will decrease Outcome: Progressing

## 2023-08-31 NOTE — Progress Notes (Signed)
 PROGRESS NOTE    Katrina Ortiz  FMW:[id number] DOB: 04/14/1955 DOA: 08/28/2023 PCP: Relda Heron LABOR, MD    Assessment & Plan:   Principal Problem:   Sepsis [facility]) Active Problems:   Acute on chronic hypoxic respiratory failure (HCC)   Type 2 diabetes mellitus (HCC)   Nausea and vomiting   Coronary artery disease   Anemia of chronic kidney failure, stage 4 (severe) (HCC)   Essential hypertension   Emphysematous pyelonephritis of left kidney  Assessment and Plan: Emphysematous pyelonephritis: continue on IV rocephin  x 6 days  Sepsis: POA, resolved. See Dr. Debi note on how pt met sepsis criteria.   Acute on chronic hypoxic respiratory failure: back on baseline oxygen of 2L [place]. PFTs suggestive of restrictive process.   ESRD: on HD. Nephro following and recs apprec   ACD: s/p 1 unit of pRBCs transfused so far. H&H are trending up   HTN: continue on home dose of coreg    DM2: well controlled. Continue on glargine, SSI w/ accuchecks    Elevated alkaline phosphatase: trending down. Will continue to monitor    Hx of CAD: s/p CAB. Continue on coreg , aspirin , statin   GERD: continue on PPI   Obesity: BMI 35.9. Would benefit from weight loss     DVT prophylaxis: SCDs Code Status: full  Family Communication: Disposition Plan: likely d/c back home  Level of care: Telemetry Medical  Status is: Inpatient Remains inpatient appropriate because: severity of illness    Consultants:    Procedures:   Antimicrobials: rocephin     Subjective: Pt c/o malaise   Objective: Vitals:   08/30/23 2346 08/31/23 0135 08/31/23 0533 08/31/23 0800  BP: (!) 151/53 (!) 154/55 (!) 144/54 (!) 169/58  Pulse: 69 68 67 66  Resp: 16 16 16 18   Temp: 98.1 F (36.7 C) 98 F (36.7 C) 98.1 F (36.7 C) 97.9 F (36.6 C)  TempSrc: Oral Oral Oral Oral  SpO2: 100% 100% 98% 100%  Weight:      Height:        Intake/Output Summary (Last 24 hours) at 08/31/2023 0923 Last data filed  at 08/31/2023 0230 Gross per 24 hour  Intake 671.75 ml  Output 2000 ml  Net -1328.25 ml   Filed Weights   08/28/23 1640 08/30/23 1520 08/30/23 1922  Weight: 94.3 kg 92 kg 89.4 kg    Examination:  General exam: Appears uncomfortable  Respiratory system: decreased breath sounds b/l  Cardiovascular system: S1/S2+. No rubs or clicks.  Gastrointestinal system: abd is soft, NT, obese & hypoactive bowel sounds  Central nervous system: alert  & oriented. Moves all extremities  Psychiatry: Judgement and insight appear normal.flat mood and affect    Data Reviewed: I have personally reviewed following labs and imaging studies  CBC: Recent Labs  Lab 08/28/23 0130 08/28/23 1036 08/29/23 0403 08/30/23 0422 08/30/23 2030 08/31/23 0541  WBC 13.7* 14.3* 10.3 7.7  --  6.9  HGB 9.1* 8.9* 7.9* 6.7* 7.9* 8.9*  HCT 28.7* 28.4* 25.5* 21.6* 24.5* 27.3*  MCV 97.0 98.6 99.6 98.2  --  93.2  PLT 202 192 144* 129*  --  144*   Basic Metabolic Panel: Recent Labs  Lab 08/28/23 0631 08/29/23 0403 08/30/23 0422 08/31/23 0541  NA 132* 133* 132* 132*  K 5.8* 4.9 4.9 3.8  CL 96* 96* 94* 94*  CO2 21* 26 24 25   GLUCOSE 106* 180* 146* 251*  BUN 63* 36* 50* 29*  CREATININE 3.36* 2.40* 3.37* 2.40*  CALCIUM  8.4* 8.4*  7.8* 8.0*   GFR: Estimated Creatinine Clearance: 23.8 mL/min (A) (by C-G formula based on SCr of 2.4 mg/dL (H)). Liver Function Tests: Recent Labs  Lab 08/28/23 0631 08/29/23 0403  AST 19 12*  ALT 8 7  ALKPHOS 164* 147*  BILITOT 1.0 0.7  PROT 6.1* 6.3*  ALBUMIN  2.7* 2.5*   Recent Labs  Lab 08/28/23 0631  LIPASE 22   No results for input(s): AMMONIA in the last 168 hours. Coagulation Profile: Recent Labs  Lab 08/29/23 0403  INR 1.3*   Cardiac Enzymes: No results for input(s): CKTOTAL, CKMB, CKMBINDEX, TROPONINI in the last 168 hours. BNP (last 3 results) No results for input(s): PROBNP in the last 8760 hours. HbA1C: Recent Labs    08/30/23 0422   HGBA1C 6.7*   CBG: Recent Labs  Lab 08/29/23 2049 08/30/23 0817 08/30/23 1212 08/30/23 2213 08/31/23 0801  GLUCAP 219* 146* 118* 144* 211*   Lipid Profile: No results for input(s): CHOL, HDL, LDLCALC, TRIG, CHOLHDL, LDLDIRECT in the last 72 hours. Thyroid Function Tests: No results for input(s): TSH, T4TOTAL, FREET4, T3FREE, THYROIDAB in the last 72 hours. Anemia Panel: Recent Labs    08/30/23 0422  FERRITIN 769*  TIBC 108*  IRON 18*   Sepsis Labs: Recent Labs  Lab 08/28/23 1036 08/28/23 1831  LATICACIDVEN 1.0 1.1    Recent Results (from the past 240 hours)  Culture, blood (Routine X 2) w Reflex to ID Panel     Status: None (Preliminary result)   Collection Time: 08/28/23  6:31 PM   Specimen: BLOOD  Result Value Ref Range Status   Specimen Description BLOOD BLOOD LEFT ARM  Final   Special Requests   Final    BOTTLES DRAWN AEROBIC AND ANAEROBIC Blood Culture results may not be optimal due to an inadequate volume of blood received in culture bottles   Culture   Final    NO GROWTH 2 DAYS Performed at [facility], [address], [place], [place] [address]    Report Status PENDING  Incomplete  Culture, blood (Routine X 2) w Reflex to ID Panel     Status: None (Preliminary result)   Collection Time: 08/28/23  8:27 PM   Specimen: BLOOD  Result Value Ref Range Status   Specimen Description BLOOD BLOOD LEFT ARM LAC  Final   Special Requests   Final    BOTTLES DRAWN AEROBIC AND ANAEROBIC Blood Culture results may not be optimal due to an inadequate volume of blood received in culture bottles   Culture   Final    NO GROWTH 2 DAYS Performed at [facility], [address] [place], [place] [address]    Report Status PENDING  Incomplete         Radiology Studies: No results found.       Scheduled Meds:  aspirin  EC  81 mg Oral Daily   carvedilol   3.125 mg Oral BID   Chlorhexidine  Gluconate Cloth  6 each Topical  Q0600   dextromethorphan -guaiFENesin   1 tablet Oral BID   epoetin  alfa-epbx (RETACRIT ) injection  10,000 Units Intravenous Q M,W,F-1800   fluticasone   1 spray Each Nare Daily   gabapentin   300 mg Oral QHS   heparin   5,000 Units Subcutaneous Q8H   insulin  aspart  0-15 Units Subcutaneous TID WC   insulin  glargine-yfgn  5 Units Subcutaneous Q2200   naLOXone  (NARCAN )  injection  0.4 mg Intravenous Once   pantoprazole   40 mg Oral Daily   polyethylene glycol  17 g Oral Daily   pravastatin   80 mg Oral Daily   senna-docusate  1 tablet Oral Daily   sodium zirconium cyclosilicate   10 g Oral Daily   Continuous Infusions:  cefTRIAXone  (ROCEPHIN )  IV 2 g (08/31/23 0832)     LOS: 3 days       Anthony CHRISTELLA Pouch, MD [facility] Pager [phone]  If 7PM-7AM, please contact night-coverage [email or web address] 08/31/2023, 9:23 AM

## 2023-09-01 DIAGNOSIS — N12 Tubulo-interstitial nephritis, not specified as acute or chronic: Secondary | ICD-10-CM | POA: Diagnosis not present

## 2023-09-01 LAB — COMPREHENSIVE METABOLIC PANEL WITH GFR
ALT: 8 U/L (ref 0–44)
AST: 10 U/L — ABNORMAL LOW (ref 15–41)
Albumin: 2.6 g/dL — ABNORMAL LOW (ref 3.5–5.0)
Alkaline Phosphatase: 256 U/L — ABNORMAL HIGH (ref 38–126)
Anion gap: 13 (ref 5–15)
BUN: 38 mg/dL — ABNORMAL HIGH (ref 8–23)
CO2: 27 mmol/L (ref 22–32)
Calcium: 8 mg/dL — ABNORMAL LOW (ref 8.9–10.3)
Chloride: 95 mmol/L — ABNORMAL LOW (ref 98–111)
Creatinine, Ser: 2.88 mg/dL — ABNORMAL HIGH (ref 0.44–1.00)
GFR, Estimated: 17 mL/min — ABNORMAL LOW (ref >60)
Glucose, Bld: 176 mg/dL — ABNORMAL HIGH (ref 70–99)
Potassium: 3.6 mmol/L (ref 3.5–5.1)
Sodium: 135 mmol/L (ref 135–145)
Total Bilirubin: 0.7 mg/dL (ref 0.0–1.2)
Total Protein: 6.2 g/dL — ABNORMAL LOW (ref 6.5–8.1)

## 2023-09-01 LAB — CBC
HCT: 26.9 % — ABNORMAL LOW (ref 36.0–46.0)
Hemoglobin: 8.7 g/dL — ABNORMAL LOW (ref 12.0–15.0)
MCH: 30.2 pg (ref 26.0–34.0)
MCHC: 32.3 g/dL (ref 30.0–36.0)
MCV: 93.4 fL (ref 80.0–100.0)
Platelets: 150 K/uL (ref 150–400)
RBC: 2.88 MIL/uL — ABNORMAL LOW (ref 3.87–5.11)
RDW: 15.6 % — ABNORMAL HIGH (ref 11.5–15.5)
WBC: 6 K/uL (ref 4.0–10.5)
nRBC: 0 % (ref 0.0–0.2)

## 2023-09-01 LAB — GLUCOSE, CAPILLARY: Glucose-Capillary: 116 mg/dL — ABNORMAL HIGH (ref 70–99)

## 2023-09-01 LAB — PHOSPHORUS: Phosphorus: 4.9 mg/dL — ABNORMAL HIGH (ref 2.5–4.6)

## 2023-09-01 MED ORDER — CEFPODOXIME PROXETIL 100 MG PO TABS: 100.0000 mg | ORAL_TABLET | Freq: Two times a day (BID) | ORAL | 0 refills | Status: AC

## 2023-09-01 MED ORDER — LOSARTAN POTASSIUM 50 MG PO TABS
50.0000 mg | ORAL_TABLET | Freq: Every day | ORAL | Status: DC
  Administered 2023-09-01: 50 mg via ORAL
  Filled 2023-09-01: qty 1

## 2023-09-01 MED ORDER — HYDRALAZINE HCL 50 MG PO TABS
50.0000 mg | ORAL_TABLET | ORAL | Status: AC
  Administered 2023-09-01: 50 mg via ORAL
  Filled 2023-09-01: qty 1

## 2023-09-01 MED ORDER — ACETAMINOPHEN 325 MG PO TABS
ORAL_TABLET | ORAL | Status: AC
  Filled 2023-09-01: qty 2

## 2023-09-01 MED ORDER — HEPARIN SODIUM (PORCINE) 1000 UNIT/ML IJ SOLN
INTRAMUSCULAR | Status: AC
Start: 2023-09-01 — End: 2023-09-01
  Filled 2023-09-01: qty 10

## 2023-09-01 MED ORDER — LOSARTAN POTASSIUM 50 MG PO TABS: 50.0000 mg | ORAL_TABLET | Freq: Every day | ORAL | 0 refills | Status: DC

## 2023-09-01 NOTE — Progress Notes (Signed)
  Received patient in bed to unit.   Informed consent signed and in chart.    TX duration: 3.5hrs     Transported back to floor  Hand-off given to patient's nurse. B/p high  primary nurse notified NP ordered meds    Access used: R HD Catheter Access issues: none   Total UF removed: 1.0L Medication(s) given: tylenol /  epo held d/t hypertension Post HD VS:  Post HD weight: 86.4kg     Olivia Hurst LPN [facility]

## 2023-09-01 NOTE — Discharge Summary (Signed)
 Physician Discharge Summary  Katrina Ortiz FMW:[id number] DOB: 1955-10-03 DOA: 08/28/2023  PCP: Relda Heron LABOR, MD  Admit date: 08/28/2023 Discharge date: 09/01/2023  Admitted From: home facility  Disposition:  home facility   Recommendations for Outpatient Follow-up:  Follow up with PCP in 1-2 weeks F/u w/ nephro in 1-2 weeks   Home Health: no  Equipment/Devices  Discharge Condition: stable  CODE STATUS: full  Diet recommendation: Heart Healthy / Carb Modified Brief/Interim Summary: HPI was taken from Dr. Soledad: Katrina Ortiz is a 68 y.o. female with medical history significant for DM II, Chronic hypoxic respiratory failure (2L) ESRD-HD, AOCD, HTN, CAD, Diffuse idiopathic skeletal hyperostosis, GERD, hyperkalemia.   The patient presented to [facility] ED on 08/28/2023 after she developed severe left flank pain last night. She states that she has had chills, but no fevers that she has noticed. She states that she has had some loose bowel movements. She states that she is nauseated. She is on dialysis and makes very little urine.   The patient states that she had dialysis last Friday and that it was a normal dialysis for her. Nephrology has been consulted, and she is set up for dialysis later today, if they can fit her into the schedule.   In the ED the patient was found to be hypotensive at 85/48, and hypoxic on her usual 2L. She is currently requiring 5L in order to maintain saturations in the 90's.    CT abdomen and pelvis has demonstrated emphysematous pyelonephritis on the left as well as cholelithiasis. The scan also captured the base of her lungs which demonstrated chronic stable small volume bilateral dependent pleural thickening with dependent collapse and consolidation in the lung base.    Blood cultures x 2 have been ordered. The patient will receive Ceftriaxone  2 gm IV daily. The patient had e coli bacteremia in May of this year. It was pan sensitive.    She will be admitted  to a telemetry bed.  Discharge Diagnoses:  Principal Problem:   Sepsis (HCC) Active Problems:   Acute on chronic hypoxic respiratory failure (HCC)   Type 2 diabetes mellitus (HCC)   Nausea and vomiting   Coronary artery disease   Anemia of chronic kidney failure, stage 4 (severe) (HCC)   Essential hypertension   Emphysematous pyelonephritis of left kidney  Emphysematous pyelonephritis: continue on IV rocephin  x 6 days while inpatient and d/c on cefpodoxime  to complete the course   Sepsis: POA, resolved. See Dr. Debi note on how pt met sepsis criteria.    Acute on chronic hypoxic respiratory failure: back on baseline oxygen of 2L [place]. PFTs suggestive of restrictive process.    ESRD: on HD. Nephro following and recs apprec    ACD: s/p 1 unit of pRBCs transfused so far. H&H are labile    HTN: continue on home dose of coreg     DM2: well controlled. Restart home doses of insulin  at d/c    Elevated alkaline phosphatase: labile. Etiology unclear. Will continue to monitor    Hx of CAD: s/p CAB. Continue on coreg , aspirin , statin   GERD: continue on PPI    Obesity: BMI 35.9. Would benefit from weight loss     Discharge Instructions  Discharge Instructions     Diet - low sodium heart healthy   Complete by: As directed    Diet Carb Modified   Complete by: As directed    Discharge instructions   Complete by: As directed    F/u w/ PCP  in 1-2 weeks. F/u w/ nephro in 1-2 weeks   Increase activity slowly   Complete by: As directed       Allergies as of 09/01/2023   No Known Allergies      Medication List     STOP taking these medications    clopidogrel  75 MG tablet Commonly known as: PLAVIX    colchicine 0.6 MG tablet   doxycycline  100 MG tablet Commonly known as: VIBRA -TABS   DULoxetine  60 MG capsule Commonly known as: CYMBALTA    furosemide  20 MG tablet Commonly known as: LASIX    hydrALAZINE  25 MG tablet Commonly known as: APRESOLINE    insulin  lispro  100 UNIT/ML injection Commonly known as: HUMALOG    leflunomide 10 MG tablet Commonly known as: ARAVA   lisinopril  5 MG tablet Commonly known as: ZESTRIL    Lokelma  10 g Pack packet Generic drug: sodium zirconium cyclosilicate    metolazone 5 MG tablet Commonly known as: ZAROXOLYN   pravastatin  80 MG tablet Commonly known as: PRAVACHOL    predniSONE  10 MG tablet Commonly known as: DELTASONE    prochlorperazine 5 MG tablet Commonly known as: COMPAZINE   Trulicity 0.75 MG/0.5ML Soaj Generic drug: Dulaglutide       TAKE these medications    acetaminophen  325 MG tablet Commonly known as: TYLENOL  Take 325 mg by mouth every 6 (six) hours as needed for mild pain (pain score 1-3).   artificial tears ophthalmic solution 1 drop at bedtime.   aspirin  EC 81 MG tablet Take 1 tablet by mouth daily.   bumetanide 1 MG tablet Commonly known as: BUMEX Take 4 mg by mouth 2 (two) times daily. What changed: Another medication with the same name was removed. Continue taking this medication, and follow the directions you see here.   Carboxymethylcellulose Sodium 1 % Gel Apply 1 drop to eye at bedtime.   carvedilol  6.25 MG tablet Commonly known as: COREG  Take 6.25 mg by mouth 2 (two) times daily. What changed: Another medication with the same name was removed. Continue taking this medication, and follow the directions you see here.   cefpodoxime  100 MG tablet Commonly known as: VANTIN  Take 1 tablet (100 mg total) by mouth 2 (two) times daily for 3 days.   cetirizine 10 MG tablet Commonly known as: ZYRTEC Take 5 mg by mouth daily. What changed: Another medication with the same name was removed. Continue taking this medication, and follow the directions you see here.   cholecalciferol 25 MCG (1000 UNIT) tablet Commonly known as: VITAMIN D3 Take 1,000 Units by mouth daily.   Decubi-Vite Caps Take 1 capsule by mouth every morning.   diclofenac Sodium 1 % Gel Commonly known as:  VOLTAREN Apply 4 g topically 3 (three) times daily.   ezetimibe 10 MG tablet Commonly known as: ZETIA Take 10 mg by mouth daily.   ferrous sulfate 325 (65 FE) MG EC tablet Take 325 mg by mouth every other day.   fluticasone  50 MCG/ACT nasal spray Commonly known as: FLONASE  Place 1 spray into both nostrils 2 (two) times daily.   gabapentin  100 MG capsule Commonly known as: NEURONTIN  Take 100 mg by mouth 2 (two) times daily. What changed: Another medication with the same name was removed. Continue taking this medication, and follow the directions you see here.   guaiFENesin  600 MG 12 hr tablet Commonly known as: MUCINEX  Take 600 mg by mouth 2 (two) times daily.   insulin  aspart 100 UNIT/ML injection Commonly known as: novoLOG  Inject into the skin. Give before meals  and at bedtime per sliding scale. BS < 80 call MD, BS 201 to 250 give 2 units, BS 251 to 300 give 4 units, BS 301 to 350 give 6 units, BS 351 to 400 give 8 units, BS 401 to 450 give 10 units, BS 451 to 500 give 12 units, BS > 500 give 14 units and call MD   Lantus  SoloStar 100 UNIT/ML Solostar Pen Generic drug: insulin  glargine Inject 10 Units into the skin at bedtime. Hold if sugar < 100 What changed: how much to take   latanoprost 0.005 % ophthalmic solution Commonly known as: XALATAN Place 1 drop into both eyes at bedtime.   linaclotide 145 MCG Caps capsule Commonly known as: LINZESS Take 145 mcg by mouth daily.   losartan  50 MG tablet Commonly known as: COZAAR  Take 1 tablet (50 mg total) by mouth daily. Start taking on: September 02, 2023   montelukast 10 MG tablet Commonly known as: SINGULAIR Take 10 mg by mouth daily.   nitroGLYCERIN 0.4 MG SL tablet Commonly known as: NITROSTAT Place 0.4 mg under the tongue every 5 (five) minutes as needed for chest pain.   omeprazole 40 MG capsule Commonly known as: PRILOSEC Take 40 mg by mouth 2 (two) times daily. What changed: Another medication with the same name  was removed. Continue taking this medication, and follow the directions you see here.   oxyCODONE -acetaminophen  5-325 MG tablet Commonly known as: PERCOCET/ROXICET Take 1 tablet by mouth every 8 (eight) hours as needed for moderate pain.   polyethylene glycol 17 g packet Commonly known as: MIRALAX  / GLYCOLAX  Take 17 g by mouth 2 (two) times daily. What changed: Another medication with the same name was removed. Continue taking this medication, and follow the directions you see here.   senna-docusate 8.6-50 MG tablet Commonly known as: Senokot-S Take 2 tablets by mouth at bedtime. What changed: Another medication with the same name was removed. Continue taking this medication, and follow the directions you see here.   simethicone  80 MG chewable tablet Commonly known as: MYLICON Chew 80 mg by mouth every 6 (six) hours as needed for flatulence.   zinc oxide 20 % ointment Apply 1 Application topically as needed for irritation.        No Known Allergies  Consultations: Nephro    Procedures/Studies: DG Chest Port 1 View Result Date: 08/28/2023 CLINICAL DATA:  Hypoxia. EXAM: PORTABLE CHEST 1 VIEW COMPARISON:  Same day radiographs dated 08/28/2023 at 10:07 a.m. FINDINGS: The heart size and mediastinal contours are unchanged. Prior median sternotomy and CABG. Aortic atherosclerosis. Stable dialysis catheter tip at the superior cavoatrial junction. Central pulmonary vascular congestion. No overt edema, focal consolidation, sizeable pleural effusion, or pneumothorax. Visualized osseous structures are unchanged. IMPRESSION: No significant change compared to the prior exam. Cardiomegaly with central pulmonary vascular congestion. Electronically Signed   By: Harrietta Sherry M.D.   On: 08/28/2023 12:15   Portable chest 1 View Result Date: 08/28/2023 CLINICAL DATA:  Hypoxia. EXAM: PORTABLE CHEST 1 VIEW COMPARISON:  Chest radiograph dated 04/13/2018 FINDINGS: Dialysis catheter with tip at the  cavoatrial junction. There is cardiomegaly with vascular congestion. No focal consolidation, pleural effusion or pneumothorax. Median sternotomy wires. No acute osseous pathology. IMPRESSION: Cardiomegaly with vascular congestion. Electronically Signed   By: Vanetta Chou M.D.   On: 08/28/2023 10:17   CT ABDOMEN PELVIS WO CONTRAST Result Date: 08/28/2023 CLINICAL DATA:  Abdominal pain in the left lower quadrant. Vomiting and diarrhea. EXAM: CT ABDOMEN AND PELVIS WITHOUT  CONTRAST TECHNIQUE: Multidetector CT imaging of the abdomen and pelvis was performed following the standard protocol without IV contrast. RADIATION DOSE REDUCTION: This exam was performed according to the departmental dose-optimization program which includes automated exposure control, adjustment of the mA and/or kV according to patient size and/or use of iterative reconstruction technique. COMPARISON:  06/02/2023 FINDINGS: Lower chest: Dependent collapse/consolidation with stable small volume bilateral dependent chronic pleural thickening/fluid. Hepatobiliary: No suspicious focal abnormality in the liver on this study without intravenous contrast. Tiny gallstones evident. No intrahepatic or extrahepatic biliary dilation. Pancreas: Pancreas is diffusely atrophic without main duct dilatation. Spleen: No splenomegaly. No suspicious focal mass lesion. Adrenals/Urinary Tract: No adrenal nodule or mass. Cortical thinning noted right kidney without evidence for hydroureteronephrosis. Gas is noted in the intrarenal collecting system of the left kidney with mild fullness of the left intrarenal collecting system and ureter. Gas is visible in the urinary bladder. Stomach/Bowel: Tiny hiatal hernia. Stomach otherwise unremarkable. Duodenum is normally positioned as is the ligament of Treitz. No small bowel wall thickening. No small bowel dilatation. The terminal ileum is normal. The appendix is normal. No gross colonic mass. No colonic wall thickening.  Vascular/Lymphatic: There is moderate atherosclerotic calcification of the abdominal aorta without aneurysm. There is no gastrohepatic or hepatoduodenal ligament lymphadenopathy. No retroperitoneal or mesenteric lymphadenopathy. No pelvic sidewall lymphadenopathy. Reproductive: There is no adnexal mass. Other: No intraperitoneal free fluid. Musculoskeletal: No worrisome lytic or sclerotic osseous abnormality. IMPRESSION: 1. Gas in the intrarenal collecting system of the left kidney with mild fullness of the left intrarenal collecting system and ureter. Gas is visible in the urinary bladder. 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 2. Cholelithiasis. 3. Tiny hiatal hernia. 4. Stable small volume bilateral dependent chronic pleural thickening/fluid with dependent collapse/consolidation in the lung bases. 5. Aortic Atherosclerosis (ICD10-I70.0) and Emphysema (ICD10-J43.9). Electronically Signed   By: Camellia Candle M.D.   On: 08/28/2023 05:28   (Echo, Carotid, EGD, Colonoscopy, ERCP)    Subjective: Pt c/o fatigue   Discharge Exam: Vitals:   09/01/23 1157 09/01/23 1228  BP: (!) 206/62 (!) 194/54  Pulse: 65 66  Resp: 12 18  Temp: 98.4 F (36.9 C) 97.9 F (36.6 C)  SpO2: 100% 100%   Vitals:   09/01/23 1130 09/01/23 1154 09/01/23 1157 09/01/23 1228  BP: (!) 218/65  (!) 206/62 (!) 194/54  Pulse: 69  65 66  Resp: 16  12 18   Temp:   98.4 F (36.9 C) 97.9 F (36.6 C)  TempSrc:   Oral   SpO2: 100%  100% 100%  Weight:  86.4 kg    Height:        General: Pt is alert, awake, not in acute distress Cardiovascular:  S1/S2 +, no rubs, no gallops Respiratory: decreased breath sounds b/l  Abdominal: Soft, NT, obese, bowel sounds + Extremities:  no cyanosis    The results of significant diagnostics from this hospitalization (including imaging, microbiology, ancillary and laboratory) are listed below for reference.      Microbiology: Recent Results (from the past 240 hours)  Culture, blood (Routine X 2) w Reflex to ID Panel     Status: None (Preliminary result)   Collection Time: 08/28/23  6:31 PM   Specimen: BLOOD  Result Value Ref Range Status   Specimen Description BLOOD BLOOD LEFT ARM  Final   Special Requests   Final    BOTTLES DRAWN AEROBIC AND ANAEROBIC Blood Culture results may not be optimal  due to an inadequate volume of blood received in culture bottles   Culture   Final    NO GROWTH 4 DAYS Performed at [facility], [address] [place], [place] [address]    Report Status PENDING  Incomplete  Culture, blood (Routine X 2) w Reflex to ID Panel     Status: None (Preliminary result)   Collection Time: 08/28/23  8:27 PM   Specimen: BLOOD  Result Value Ref Range Status   Specimen Description BLOOD BLOOD LEFT ARM LAC  Final   Special Requests   Final    BOTTLES DRAWN AEROBIC AND ANAEROBIC Blood Culture results may not be optimal due to an inadequate volume of blood received in culture bottles   Culture   Final    NO GROWTH 4 DAYS Performed at [facility], [address], [place], [place] [address]    Report Status PENDING  Incomplete     Labs: BNP (last 3 results) No results for input(s): BNP in the last 8760 hours. Basic Metabolic Panel: Recent Labs  Lab 08/28/23 0631 08/29/23 0403 08/30/23 0422 08/31/23 0541 09/01/23 0411  NA 132* 133* 132* 132* 135  K 5.8* 4.9 4.9 3.8 3.6  CL 96* 96* 94* 94* 95*  CO2 21* 26 24 25 27   GLUCOSE 106* 180* 146* 251* 176*  BUN 63* 36* 50* 29* 38*  CREATININE 3.36* 2.40* 3.37* 2.40* 2.88*  CALCIUM  8.4* 8.4* 7.8* 8.0* 8.0*  PHOS  --   --   --   --  4.9*   Liver Function Tests: Recent Labs  Lab 08/28/23 0631 08/29/23 0403 09/01/23 0411  AST 19 12* 10*  ALT 8 7 8   ALKPHOS 164* 147* 256*  BILITOT 1.0 0.7 0.7  PROT 6.1* 6.3* 6.2*  ALBUMIN  2.7* 2.5* 2.6*   Recent Labs  Lab 08/28/23 0631  LIPASE 22   No  results for input(s): AMMONIA in the last 168 hours. CBC: Recent Labs  Lab 08/28/23 1036 08/29/23 0403 08/30/23 0422 08/30/23 2030 08/31/23 0541 09/01/23 0411  WBC 14.3* 10.3 7.7  --  6.9 6.0  HGB 8.9* 7.9* 6.7* 7.9* 8.9* 8.7*  HCT 28.4* 25.5* 21.6* 24.5* 27.3* 26.9*  MCV 98.6 99.6 98.2  --  93.2 93.4  PLT 192 144* 129*  --  144* 150   Cardiac Enzymes: No results for input(s): CKTOTAL, CKMB, CKMBINDEX, TROPONINI in the last 168 hours. BNP: Invalid input(s): POCBNP CBG: Recent Labs  Lab 08/31/23 0801 08/31/23 1159 08/31/23 1643 08/31/23 2157 09/01/23 1224  GLUCAP 211* 136* 165* 189* 116*   D-Dimer No results for input(s): DDIMER in the last 72 hours. Hgb A1c Recent Labs    08/30/23 0422  HGBA1C 6.7*   Lipid Profile No results for input(s): CHOL, HDL, LDLCALC, TRIG, CHOLHDL, LDLDIRECT in the last 72 hours. Thyroid function studies No results for input(s): TSH, T4TOTAL, T3FREE, THYROIDAB in the last 72 hours.  Invalid input(s): FREET3 Anemia work up Recent Labs    08/30/23 0422  FERRITIN 769*  TIBC 108*  IRON 18*   Urinalysis    Component Value Date/Time   COLORURINE YELLOW (A) 04/12/2021 1935   APPEARANCEUR HAZY (A) 04/12/2021 1935   LABSPEC 1.010 04/12/2021 1935   PHURINE 5.0 04/12/2021 1935   GLUCOSEU 150 (A) 04/12/2021 1935   HGBUR MODERATE (A) 04/12/2021 1935   BILIRUBINUR NEGATIVE 04/12/2021 1935   KETONESUR 5 (A) 04/12/2021 1935   PROTEINUR 100 (A) 04/12/2021 1935   NITRITE NEGATIVE 04/12/2021 1935  LEUKOCYTESUR TRACE (A) 04/12/2021 1935   Sepsis Labs Recent Labs  Lab 08/29/23 0403 08/30/23 0422 08/31/23 0541 09/01/23 0411  WBC 10.3 7.7 6.9 6.0   Microbiology Recent Results (from the past 240 hours)  Culture, blood (Routine X 2) w Reflex to ID Panel     Status: None (Preliminary result)   Collection Time: 08/28/23  6:31 PM   Specimen: BLOOD  Result Value Ref Range Status   Specimen Description  BLOOD BLOOD LEFT ARM  Final   Special Requests   Final    BOTTLES DRAWN AEROBIC AND ANAEROBIC Blood Culture results may not be optimal due to an inadequate volume of blood received in culture bottles   Culture   Final    NO GROWTH 4 DAYS Performed at [facility], [address] [place], [place] [address]    Report Status PENDING  Incomplete  Culture, blood (Routine X 2) w Reflex to ID Panel     Status: None (Preliminary result)   Collection Time: 08/28/23  8:27 PM   Specimen: BLOOD  Result Value Ref Range Status   Specimen Description BLOOD BLOOD LEFT ARM LAC  Final   Special Requests   Final    BOTTLES DRAWN AEROBIC AND ANAEROBIC Blood Culture results may not be optimal due to an inadequate volume of blood received in culture bottles   Culture   Final    NO GROWTH 4 DAYS Performed at [facility], [address] [place], [place] [address]    Report Status PENDING  Incomplete     Time coordinating discharge: 36 minutes  SIGNED:   Anthony CHRISTELLA Pouch, MD  [facility] 09/01/2023, 1:12 PM Pager   If 7PM-7AM, please contact night-coverage [email or web address]

## 2023-09-01 NOTE — TOC Transition Note (Signed)
 Transition of Care [facility]) - Discharge Note   Patient Details  Name: Katrina Ortiz MRN: [id number] Date of Birth: 14-Dec-1955  [facility] of [facility]) CM/SW Contact:  Lauraine [name], LCSW Phone Number: 09/01/2023, 3:46 PM   Clinical Narrative:   Patient has orders to discharge to [facility] today. RN will call report to [phone] (Room F-15). [facility] has been arranged and she is 7th on the list. No further concerns. CSW signing off.  Final next level of care: Skilled Nursing Facility Barriers to Discharge: No Barriers Identified   Patient Goals and CMS Choice            Discharge Placement   Existing PASRR number confirmed : 09/01/23          Patient chooses bed at: Other - please specify in the comment section below: ([facility]) Patient to be transferred to facility by: [facility]   Patient and family notified of of transfer: 09/01/23  Discharge Plan and Services Additional resources added to the After Visit Summary for                                       Social Drivers of Health (SDOH) Interventions SDOH Screenings   Food Insecurity: No Food Insecurity (08/28/2023)  Housing: Patient Declined (08/28/2023)  Transportation Needs: Patient Declined (08/28/2023)  Utilities: Patient Declined (08/28/2023)  Financial Resource Strain: Low Risk  (07/06/2023)   Received from [facility]  Physical Activity: Inactive (07/08/2020)   Received [facility]  Social Connections: Patient Declined (08/28/2023)  Stress: No Stress Concern Present (07/08/2020)   Received from [facility]  Tobacco Use: Medium Risk (08/28/2023)     Readmission Risk Interventions    04/16/2021   10:42 AM  Readmission Risk Prevention Plan  Transportation Screening Complete  PCP or Specialist Appt within 3-5 Days Complete  Social Work Consult for Recovery Care Planning/Counseling Complete  Palliative Care Screening Not  Applicable  Medication Review Oceanographer) Complete

## 2023-09-01 NOTE — Progress Notes (Signed)
 [facility]  ROUNDING NOTE   Subjective:   Patient seen and evaluated during dialysis   HEMODIALYSIS FLOWSHEET:  Blood Flow Rate (mL/min): 399 mL/min Arterial Pressure (mmHg): -173.12 mmHg Venous Pressure (mmHg): 195.75 mmHg TMP (mmHg): 2.22 mmHg Ultrafiltration Rate (mL/min): 543 mL/min Dialysate Flow Rate (mL/min): 299 ml/min  Resting comfortably during treatment.   Objective:  Vital signs in last 24 hours:  Temp:  [97.6 F (36.4 C)-98.6 F (37 C)] 97.7 F (36.5 C) (07/11 0810) Pulse Rate:  [56-67] 60 (07/11 1030) Resp:  [11-23] 20 (07/11 1030) BP: (169-193)/(51-84) 185/51 (07/11 1030) SpO2:  [96 %-100 %] 100 % (07/11 1030) Weight:  [87.3 kg] 87.3 kg (07/11 0834)  Weight change:  Filed Weights   08/30/23 1520 08/30/23 1922 09/01/23 0834  Weight: 92 kg 89.4 kg 87.3 kg    Intake/Output: I/O last 3 completed shifts: In: 1391.8 [P.O.:960; Blood:431.8] Out: 2400 [Urine:400; Other:2000]   Intake/Output this shift:  No intake/output data recorded.  Physical Exam: General: NAD  Head: Normocephalic, atraumatic. Moist oral mucosal membranes  Eyes: Anicteric  Neck: Supple  Lungs:  Clear to auscultation  Heart: Regular rate and rhythm  Abdomen:  Soft, nontender  Extremities:  No peripheral edema.  Neurologic: Awake, alert, conversant  Skin: Warm,dry, no rash  Access: Rt permcath    Basic Metabolic Panel: Recent Labs  Lab 08/28/23 0631 08/29/23 0403 08/30/23 0422 08/31/23 0541 09/01/23 0411  NA 132* 133* 132* 132* 135  K 5.8* 4.9 4.9 3.8 3.6  CL 96* 96* 94* 94* 95*  CO2 21* 26 24 25 27   GLUCOSE 106* 180* 146* 251* 176*  BUN 63* 36* 50* 29* 38*  CREATININE 3.36* 2.40* 3.37* 2.40* 2.88*  CALCIUM  8.4* 8.4* 7.8* 8.0* 8.0*    Liver Function Tests: Recent Labs  Lab 08/28/23 0631 08/29/23 0403 09/01/23 0411  AST 19 12* 10*  ALT 8 7 8   ALKPHOS 164* 147* 256*  BILITOT 1.0 0.7 0.7  PROT 6.1* 6.3* 6.2*  ALBUMIN  2.7* 2.5* 2.6*   Recent  Labs  Lab 08/28/23 0631  LIPASE 22   No results for input(s): AMMONIA in the last 168 hours.  CBC: Recent Labs  Lab 08/28/23 1036 08/29/23 0403 08/30/23 0422 08/30/23 2030 08/31/23 0541 09/01/23 0411  WBC 14.3* 10.3 7.7  --  6.9 6.0  HGB 8.9* 7.9* 6.7* 7.9* 8.9* 8.7*  HCT 28.4* 25.5* 21.6* 24.5* 27.3* 26.9*  MCV 98.6 99.6 98.2  --  93.2 93.4  PLT 192 144* 129*  --  144* 150    Cardiac Enzymes: No results for input(s): CKTOTAL, CKMB, CKMBINDEX, TROPONINI in the last 168 hours.  BNP: Invalid input(s): POCBNP  CBG: Recent Labs  Lab 08/30/23 2213 08/31/23 0801 08/31/23 1159 08/31/23 1643 08/31/23 2157  GLUCAP 144* 211* 136* 165* 189*    Microbiology: Results for orders placed or performed during the hospital encounter of 08/28/23  Culture, blood (Routine X 2) w Reflex to ID Panel     Status: None (Preliminary result)   Collection Time: 08/28/23  6:31 PM   Specimen: BLOOD  Result Value Ref Range Status   Specimen Description BLOOD BLOOD LEFT ARM  Final   Special Requests   Final    BOTTLES DRAWN AEROBIC AND ANAEROBIC Blood Culture results may not be optimal due to an inadequate volume of blood received in culture bottles   Culture   Final    NO GROWTH 4 DAYS Performed at [facility], [address], [place], [place]  [address]    Report Status PENDING  Incomplete  Culture, blood (Routine X 2) w Reflex to ID Panel     Status: None (Preliminary result)   Collection Time: 08/28/23  8:27 PM   Specimen: BLOOD  Result Value Ref Range Status   Specimen Description BLOOD BLOOD LEFT ARM LAC  Final   Special Requests   Final    BOTTLES DRAWN AEROBIC AND ANAEROBIC Blood Culture results may not be optimal due to an inadequate volume of blood received in culture bottles   Culture   Final    NO GROWTH 4 DAYS Performed at [facility], [address] [place], [place] [address]    Report Status PENDING  Incomplete    Coagulation  Studies: No results for input(s): LABPROT, INR in the last 72 hours.   Urinalysis: No results for input(s): COLORURINE, LABSPEC, PHURINE, GLUCOSEU, HGBUR, BILIRUBINUR, KETONESUR, PROTEINUR, UROBILINOGEN, NITRITE, LEUKOCYTESUR in the last 72 hours.  Invalid input(s): APPERANCEUR    Imaging: No results found.    Medications:    cefTRIAXone  (ROCEPHIN )  IV 2 g (08/31/23 0832)    aspirin  EC  81 mg Oral Daily   carvedilol   3.125 mg Oral BID   Chlorhexidine  Gluconate Cloth  6 each Topical Q0600   dextromethorphan -guaiFENesin   1 tablet Oral BID   epoetin  alfa-epbx (RETACRIT ) injection  10,000 Units Intravenous Q M,W,F-1800   fluticasone   1 spray Each Nare Daily   gabapentin   300 mg Oral QHS   heparin   5,000 Units Subcutaneous Q8H   insulin  aspart  0-15 Units Subcutaneous TID WC   insulin  glargine-yfgn  5 Units Subcutaneous Q2200   naLOXone  (NARCAN )  injection  0.4 mg Intravenous Once   pantoprazole   40 mg Oral Daily   polyethylene glycol  17 g Oral Daily   pravastatin   80 mg Oral Daily   senna-docusate  1 tablet Oral Daily   acetaminophen  **OR** acetaminophen , ondansetron  **OR** ondansetron  (ZOFRAN ) IV, oxyCODONE , sorbitol   Assessment/ Plan:  Katrina Ortiz is a 68 y.o.  female with past medical history of diabetes, hypertension, CAD, GERD, and end stage renal disease on HD, who was admitted to [facility] on 08/28/2023 for Pyelonephritis [N12] ESRD on hemodialysis (HCC) [N18.6, Z99.2] Sepsis (HCC) [A41.9]  [facility]  End-stage renal disease on hemodialysis  Patient receiving dialysis today, UF goal 1 to 1.5 L as tolerated.  Next treatment scheduled for Monday.  Patient cleared for discharge per nephrology.  2. Anemia of chronic kidney disease Lab Results  Component Value Date   HGB 8.7 (L) 09/01/2023    Continue Retacrit  IV with dialysis.  Hemoglobin remained stable since blood transfusion on 7/9.  3. Secondary Hyperparathyroidism: with  outpatient labs: Unavailable  Lab Results  Component Value Date   CALCIUM  8.0 (L) 09/01/2023   PHOS 3.1 04/13/2019    Calcium  acceptable.  Awaiting updated phosphorus.  4.  Hypertension with chronic kidney disease.  Home regimen includes carvedilol , Bumex, furosemide , hydralazine , lisinopril .   Currently receiving carvedilol  only.  Blood pressure 187/62 during dialysis.   LOS: 4 Twila Rappa 7/11/202510:59 AM

## 2023-09-01 NOTE — NC FL2 (Signed)
 [place]  MEDICAID [id number] LEVEL OF CARE FORM     IDENTIFICATION  Patient Name: Katrina Ortiz Birthdate: 1955-12-20 Sex: female Admission Date (Current Location): 08/28/2023  [place] and [place] Number:  Chiropodist and Address:  [facility], [address], [place], [place] [address]      Provider Number: [id number]  Attending Physician Name and Address:  Trudy Anthony HERO, MD  Relative Name and Phone Number:       Current Level of Care: Hospital Recommended Level of Care: Skilled Nursing Facility Prior Approval Number:    Date Approved/Denied:   PASRR Number: [id number]  Discharge Plan: SNF    Current Diagnoses: Patient Active Problem List   Diagnosis Date Noted   Sepsis (HCC) 08/28/2023   Pyelonephritis 08/28/2023   Hyperkalemia 04/19/2021   Essential hypertension 04/16/2021   CHF exacerbation (HCC) 04/14/2021   Cellulitis of left wrist    UTI (urinary tract infection)    Cellulitis of right forearm    CKD (chronic kidney disease) stage 4, GFR 15-29 ml/min (HCC)    Anemia of chronic kidney failure, stage 4 (severe) (HCC)    Hx of CABG    Physical deconditioning    Nausea and vomiting 04/11/2021   SOB (shortness of breath)    AKI (acute kidney injury) (HCC) 04/09/2021   Acute on chronic diastolic CHF (congestive heart failure) (HCC) 04/09/2021   Hypothermia 04/09/2021   Type 2 diabetes mellitus (HCC) 04/14/2019   Acute diastolic CHF (congestive heart failure) (HCC) 04/14/2019   Hypoglycemia 04/14/2019   Acute on chronic hypoxic respiratory failure (HCC) 04/10/2019   Coronary artery disease 05/23/2010    Orientation RESPIRATION BLADDER Height & Weight     Self, Time, Situation, Place  O2 (Nasal Cannula 2 L) Incontinent Weight: 190 lb 7.6 oz (86.4 kg) Height:  5' 3 (160 cm)  BEHAVIORAL SYMPTOMS/MOOD NEUROLOGICAL BOWEL NUTRITION STATUS   (None)  (None) Incontinent Diet (Carb modified)  AMBULATORY STATUS COMMUNICATION OF  NEEDS Skin     Verbally Bruising                       Personal Care Assistance Level of Assistance              Functional Limitations Info  Sight, Hearing, Speech Sight Info: Adequate Hearing Info: Adequate Speech Info: Adequate    SPECIAL CARE FACTORS FREQUENCY                       Contractures Contractures Info: Not present    Additional Factors Info  Code Status, Allergies, Isolation Precautions Code Status Info: Full code Allergies Info: Contact precautions     Isolation Precautions Info: NKDA     Current Medications (09/01/2023):  This is the current hospital active medication list Current Facility-Administered Medications  Medication Dose Route Frequency Provider Last Rate Last Admin   acetaminophen  (TYLENOL ) tablet 650 mg  650 mg Oral Q6H PRN Swayze, Ava, DO   650 mg at 09/01/23 0946   Or   acetaminophen  (TYLENOL ) suppository 650 mg  650 mg Rectal Q6H PRN Swayze, Ava, DO       aspirin  EC tablet 81 mg  81 mg Oral Daily Swayze, Ava, DO   81 mg at 09/01/23 1301   carvedilol  (COREG ) tablet 3.125 mg  3.125 mg Oral BID Franchot Novel, MD   3.125 mg at 09/01/23 1301   cefTRIAXone  (ROCEPHIN ) 2 g in sodium chloride  0.9 % 100 mL  IVPB  2 g Intravenous Q24H Swayze, Ava, DO 200 mL/hr at 09/01/23 1313 2 g at 09/01/23 1313   Chlorhexidine  Gluconate Cloth 2 % PADS 6 each  6 each Topical Q0600 Marcelino, Munsoor, MD   6 each at 09/01/23 9388   dextromethorphan -guaiFENesin  (MUCINEX  DM) 30-600 MG per 12 hr tablet 1 tablet  1 tablet Oral BID Trudy Anthony HERO, MD   1 tablet at 09/01/23 0753   epoetin  alfa-epbx (RETACRIT ) injection 10,000 Units  10,000 Units Intravenous Q M,W,F-1800 Breeze, Shantelle, NP   10,000 Units at 08/30/23 1800   fluticasone  (FLONASE ) 50 MCG/ACT nasal spray 1 spray  1 spray Each Nare Daily Franchot Novel, MD   1 spray at 09/01/23 0747   gabapentin  (NEURONTIN ) capsule 300 mg  300 mg Oral QHS Swayze, Ava, DO   300 mg at 08/31/23 2214   heparin   injection 5,000 Units  5,000 Units Subcutaneous Q8H Swayze, Ava, DO   5,000 Units at 09/01/23 1303   insulin  aspart (novoLOG ) injection 0-15 Units  0-15 Units Subcutaneous TID WC Lenon Elsie HERO, RPH   3 Units at 08/31/23 1655   insulin  glargine-yfgn (SEMGLEE ) injection 5 Units  5 Units Subcutaneous Q2200 Dail Rankin RAMAN, RPH   5 Units at 08/31/23 2215   losartan  (COZAAR ) tablet 50 mg  50 mg Oral Daily Druscilla Bald, NP   50 mg at 09/01/23 1303   naloxone  (NARCAN ) injection 0.4 mg  0.4 mg Intravenous Once Viviann Pastor, MD       ondansetron  (ZOFRAN ) tablet 4 mg  4 mg Oral Q6H PRN Swayze, Ava, DO       Or   ondansetron  (ZOFRAN ) injection 4 mg  4 mg Intravenous Q6H PRN Swayze, Ava, DO       oxyCODONE  (Oxy IR/ROXICODONE ) immediate release tablet 5 mg  5 mg Oral Q4H PRN Swayze, Ava, DO       pantoprazole  (PROTONIX ) EC tablet 40 mg  40 mg Oral Daily Swayze, Ava, DO   40 mg at 09/01/23 1301   polyethylene glycol (MIRALAX  / GLYCOLAX ) packet 17 g  17 g Oral Daily Swayze, Ava, DO   17 g at 09/01/23 1301   pravastatin  (PRAVACHOL ) tablet 80 mg  80 mg Oral Daily Swayze, Ava, DO   80 mg at 09/01/23 1300   senna-docusate (Senokot-S) tablet 1 tablet  1 tablet Oral Daily Swayze, Ava, DO   1 tablet at 09/01/23 1303   sorbitol  70 % solution 30 mL  30 mL Oral Daily PRN Swayze, Ava, DO         Discharge Medications: Please see discharge summary for a list of discharge medications.  Relevant Imaging Results:  Relevant Lab Results:   Additional Information SS#: [id number]  Lauraine [name], LCSW

## 2023-09-02 LAB — CULTURE, BLOOD (ROUTINE X 2)
Culture: NO GROWTH
Culture: NO GROWTH

## 2023-10-17 ENCOUNTER — Other Ambulatory Visit: Payer: Self-pay

## 2023-10-17 ENCOUNTER — Emergency Department

## 2023-10-17 ENCOUNTER — Observation Stay (HOSPITAL_BASED_OUTPATIENT_CLINIC_OR_DEPARTMENT_OTHER)
Admission: EM | Admit: 2023-10-17 | Discharge: 2023-10-18 | Disposition: A | Discharge status: Skilled Nursing Facility | Source: Home / Self Care | Attending: Emergency Medicine | Admitting: Emergency Medicine

## 2023-10-17 ENCOUNTER — Encounter: Payer: Self-pay | Admitting: Emergency Medicine

## 2023-10-17 DIAGNOSIS — Z6839 Body mass index (BMI) 39.0-39.9, adult: Secondary | ICD-10-CM | POA: Insufficient documentation

## 2023-10-17 DIAGNOSIS — I132 Hypertensive heart and chronic kidney disease with heart failure and with stage 5 chronic kidney disease, or end stage renal disease: Secondary | ICD-10-CM | POA: Insufficient documentation

## 2023-10-17 DIAGNOSIS — S82141A Displaced bicondylar fracture of right tibia, initial encounter for closed fracture: Secondary | ICD-10-CM | POA: Diagnosis present

## 2023-10-17 DIAGNOSIS — I10 Essential (primary) hypertension: Secondary | ICD-10-CM | POA: Diagnosis not present

## 2023-10-17 DIAGNOSIS — Y849 Medical procedure, unspecified as the cause of abnormal reaction of the patient, or of later complication, without mention of misadventure at the time of the procedure: Secondary | ICD-10-CM | POA: Insufficient documentation

## 2023-10-17 DIAGNOSIS — S82121A Displaced fracture of lateral condyle of right tibia, initial encounter for closed fracture: Secondary | ICD-10-CM | POA: Insufficient documentation

## 2023-10-17 DIAGNOSIS — R1084 Generalized abdominal pain: Principal | ICD-10-CM | POA: Insufficient documentation

## 2023-10-17 DIAGNOSIS — N3090 Cystitis, unspecified without hematuria: Secondary | ICD-10-CM | POA: Diagnosis not present

## 2023-10-17 DIAGNOSIS — E785 Hyperlipidemia, unspecified: Secondary | ICD-10-CM | POA: Insufficient documentation

## 2023-10-17 DIAGNOSIS — I2510 Atherosclerotic heart disease of native coronary artery without angina pectoris: Secondary | ICD-10-CM | POA: Diagnosis present

## 2023-10-17 DIAGNOSIS — Z87891 Personal history of nicotine dependence: Secondary | ICD-10-CM | POA: Insufficient documentation

## 2023-10-17 DIAGNOSIS — N390 Urinary tract infection, site not specified: Secondary | ICD-10-CM | POA: Diagnosis present

## 2023-10-17 DIAGNOSIS — D631 Anemia in chronic kidney disease: Secondary | ICD-10-CM | POA: Insufficient documentation

## 2023-10-17 DIAGNOSIS — N186 End stage renal disease: Secondary | ICD-10-CM | POA: Insufficient documentation

## 2023-10-17 DIAGNOSIS — Z7982 Long term (current) use of aspirin: Secondary | ICD-10-CM | POA: Insufficient documentation

## 2023-10-17 DIAGNOSIS — Z79899 Other long term (current) drug therapy: Secondary | ICD-10-CM | POA: Insufficient documentation

## 2023-10-17 DIAGNOSIS — Z794 Long term (current) use of insulin: Secondary | ICD-10-CM | POA: Insufficient documentation

## 2023-10-17 DIAGNOSIS — E1122 Type 2 diabetes mellitus with diabetic chronic kidney disease: Secondary | ICD-10-CM | POA: Insufficient documentation

## 2023-10-17 DIAGNOSIS — I5032 Chronic diastolic (congestive) heart failure: Secondary | ICD-10-CM | POA: Diagnosis present

## 2023-10-17 DIAGNOSIS — R109 Unspecified abdominal pain: Secondary | ICD-10-CM | POA: Diagnosis not present

## 2023-10-17 DIAGNOSIS — E669 Obesity, unspecified: Secondary | ICD-10-CM | POA: Diagnosis present

## 2023-10-17 DIAGNOSIS — E1129 Type 2 diabetes mellitus with other diabetic kidney complication: Secondary | ICD-10-CM | POA: Diagnosis present

## 2023-10-17 DIAGNOSIS — S82141S Displaced bicondylar fracture of right tibia, sequela: Secondary | ICD-10-CM | POA: Diagnosis present

## 2023-10-17 DIAGNOSIS — R9431 Abnormal electrocardiogram [ECG] [EKG]: Secondary | ICD-10-CM | POA: Diagnosis not present

## 2023-10-17 DIAGNOSIS — Z992 Dependence on renal dialysis: Secondary | ICD-10-CM | POA: Insufficient documentation

## 2023-10-17 DIAGNOSIS — R112 Nausea with vomiting, unspecified: Secondary | ICD-10-CM

## 2023-10-17 DIAGNOSIS — B9629 Other Escherichia coli [E. coli] as the cause of diseases classified elsewhere: Secondary | ICD-10-CM | POA: Diagnosis present

## 2023-10-17 LAB — COMPREHENSIVE METABOLIC PANEL WITH GFR
ALT: 14 U/L (ref 0–44)
AST: 32 U/L (ref 15–41)
Albumin: 3.1 g/dL — ABNORMAL LOW (ref 3.5–5.0)
Alkaline Phosphatase: 202 U/L — ABNORMAL HIGH (ref 38–126)
Anion gap: 16 — ABNORMAL HIGH (ref 5–15)
BUN: 35 mg/dL — ABNORMAL HIGH (ref 8–23)
CO2: 22 mmol/L (ref 22–32)
Calcium: 8.9 mg/dL (ref 8.9–10.3)
Chloride: 93 mmol/L — ABNORMAL LOW (ref 98–111)
Creatinine, Ser: 2.2 mg/dL — ABNORMAL HIGH (ref 0.44–1.00)
GFR, Estimated: 24 mL/min — ABNORMAL LOW (ref >60)
Glucose, Bld: 247 mg/dL — ABNORMAL HIGH (ref 70–99)
Potassium: 4.4 mmol/L (ref 3.5–5.1)
Sodium: 131 mmol/L — ABNORMAL LOW (ref 135–145)
Total Bilirubin: 1.1 mg/dL (ref 0.0–1.2)
Total Protein: 7.2 g/dL (ref 6.5–8.1)

## 2023-10-17 LAB — CBC
HCT: 29.5 % — ABNORMAL LOW (ref 36.0–46.0)
Hemoglobin: 9.3 g/dL — ABNORMAL LOW (ref 12.0–15.0)
MCH: 29.8 pg (ref 26.0–34.0)
MCHC: 31.5 g/dL (ref 30.0–36.0)
MCV: 94.6 fL (ref 80.0–100.0)
Platelets: 196 K/uL (ref 150–400)
RBC: 3.12 MIL/uL — ABNORMAL LOW (ref 3.87–5.11)
RDW: 14.2 % (ref 11.5–15.5)
WBC: 9.5 K/uL (ref 4.0–10.5)
nRBC: 0 % (ref 0.0–0.2)

## 2023-10-17 LAB — URINALYSIS, ROUTINE W REFLEX MICROSCOPIC
Bacteria, UA: NONE SEEN
Bilirubin Urine: NEGATIVE
Glucose, UA: 150 mg/dL — AB
Ketones, ur: NEGATIVE mg/dL
Nitrite: NEGATIVE
Protein, ur: >=300 mg/dL — AB
Specific Gravity, Urine: 1.007 (ref 1.005–1.030)
Squamous Epithelial / HPF: 0 /HPF (ref 0–5)
WBC, UA: >50 WBC/hpf (ref 0–5)
pH: 7 (ref 5.0–8.0)

## 2023-10-17 LAB — GLUCOSE, CAPILLARY: Glucose-Capillary: 163 mg/dL — ABNORMAL HIGH (ref 70–99)

## 2023-10-17 LAB — LIPASE, BLOOD: Lipase: 20 U/L (ref 11–51)

## 2023-10-17 MED ORDER — ONDANSETRON HCL 4 MG/2ML IJ SOLN
4.0000 mg | Freq: Once | INTRAMUSCULAR | Status: AC
  Administered 2023-10-17: 4 mg via INTRAVENOUS
  Filled 2023-10-17: qty 2

## 2023-10-17 MED ORDER — DM-GUAIFENESIN ER 30-600 MG PO TB12: 1.0000 | ORAL_TABLET | Freq: Two times a day (BID) | ORAL | Status: DC | PRN

## 2023-10-17 MED ORDER — HEPARIN SODIUM (PORCINE) 5000 UNIT/ML IJ SOLN
5000.0000 [IU] | Freq: Three times a day (TID) | INTRAMUSCULAR | Status: DC
  Administered 2023-10-17 – 2023-10-18 (×2): 5000 [IU] via SUBCUTANEOUS
  Filled 2023-10-17 (×2): qty 1

## 2023-10-17 MED ORDER — OXYCODONE-ACETAMINOPHEN 5-325 MG PO TABS: 1.0000 | ORAL_TABLET | ORAL | Status: DC | PRN

## 2023-10-17 MED ORDER — MORPHINE SULFATE (PF) 2 MG/ML IV SOLN: 2.0000 mg | INTRAVENOUS | Status: DC | PRN

## 2023-10-17 MED ORDER — ACETAMINOPHEN 325 MG PO TABS: 650.0000 mg | ORAL_TABLET | Freq: Four times a day (QID) | ORAL | Status: DC | PRN

## 2023-10-17 MED ORDER — METHOCARBAMOL 500 MG PO TABS
500.0000 mg | ORAL_TABLET | Freq: Three times a day (TID) | ORAL | Status: DC | PRN
  Administered 2023-10-17: 500 mg via ORAL
  Filled 2023-10-17: qty 1

## 2023-10-17 MED ORDER — INSULIN ASPART 100 UNIT/ML IJ SOLN
0.0000 [IU] | Freq: Three times a day (TID) | INTRAMUSCULAR | Status: DC
  Administered 2023-10-18 (×2): 1 [IU] via SUBCUTANEOUS
  Filled 2023-10-17 (×2): qty 1

## 2023-10-17 MED ORDER — HYDROMORPHONE HCL 1 MG/ML IJ SOLN
0.5000 mg | Freq: Once | INTRAMUSCULAR | Status: AC
  Administered 2023-10-17: 0.5 mg via INTRAVENOUS
  Filled 2023-10-17: qty 0.5

## 2023-10-17 MED ORDER — LIDOCAINE 5 % EX PTCH
1.0000 | MEDICATED_PATCH | CUTANEOUS | Status: DC
  Administered 2023-10-17: 1 via TRANSDERMAL
  Filled 2023-10-17: qty 1

## 2023-10-17 MED ORDER — MORPHINE SULFATE (PF) 4 MG/ML IV SOLN
4.0000 mg | Freq: Once | INTRAVENOUS | Status: AC
  Administered 2023-10-17: 4 mg via INTRAVENOUS
  Filled 2023-10-17: qty 1

## 2023-10-17 MED ORDER — INSULIN ASPART 100 UNIT/ML IJ SOLN: 0.0000 [IU] | Freq: Every day | INTRAMUSCULAR | Status: DC

## 2023-10-17 MED ORDER — ONDANSETRON HCL 4 MG/2ML IJ SOLN
4.0000 mg | Freq: Three times a day (TID) | INTRAMUSCULAR | Status: DC | PRN
  Administered 2023-10-18: 4 mg via INTRAVENOUS
  Filled 2023-10-17: qty 2

## 2023-10-17 MED ORDER — ALBUTEROL SULFATE (2.5 MG/3ML) 0.083% IN NEBU: 2.5000 mg | INHALATION_SOLUTION | RESPIRATORY_TRACT | Status: DC | PRN

## 2023-10-17 MED ORDER — HYDRALAZINE HCL 20 MG/ML IJ SOLN
5.0000 mg | INTRAMUSCULAR | Status: DC | PRN
  Administered 2023-10-17: 5 mg via INTRAVENOUS
  Filled 2023-10-17: qty 1

## 2023-10-17 NOTE — H&P (Incomplete)
 History and Physical    Katrina Ortiz FMW:[id number] DOB: 24-Mar-1955 DOA: 10/17/2023  Referring MD/NP/PA:   PCP: Relda Heron LABOR, MD   Patient coming from:  The patient is coming from home.     Chief Complaint:   HPI: Katrina Ortiz is a 68 y.o. female with medical history significant of      Data reviewed independently and ED Course: pt was found to have     ***       EKG: I have personally reviewed.  Not done in ED, will get one.   ***   Review of Systems:   General: no fevers, chills, no body weight gain, has poor appetite, has fatigue HEENT: no blurry vision, hearing changes or sore throat Respiratory: no dyspnea, coughing, wheezing CV: no chest pain, no palpitations GI: no nausea, vomiting, abdominal pain, diarrhea, constipation GU: no dysuria, burning on urination, increased urinary frequency, hematuria  Ext: no leg edema Neuro: no unilateral weakness, numbness, or tingling, no vision change or hearing loss Skin: no rash, no skin tear. MSK: No muscle spasm, no deformity, no limitation of range of movement in spin Heme: No easy bruising.  Travel history: No recent long distant travel.   Allergy: No Known Allergies  Past Medical History:  Diagnosis Date   Anemia    CHF (congestive heart failure) (HCC)    Chronic kidney disease    Coronary artery disease    Diabetes mellitus without complication (HCC)    DISH (diffuse idiopathic skeletal hyperostosis)    GERD (gastroesophageal reflux disease)    Hyperkalemia    Hypertension    Psoriasis    Psoriasis     Past Surgical History:  Procedure Laterality Date   CESAREAN SECTION     2 c-sections   CORONARY ARTERY BYPASS GRAFT     CORONARY STENT PLACEMENT     HERNIA REPAIR     TUBAL LIGATION      Social History:  reports that she quit smoking about 20 years ago. Her smoking use included cigarettes. She has never used smokeless tobacco. She reports that she does not drink alcohol  and does not use  drugs.  Family History: History reviewed. No pertinent family history.   Prior to Admission medications   Medication Sig Start Date End Date Taking? Authorizing Provider  acetaminophen  (TYLENOL ) 325 MG tablet Take 325 mg by mouth every 6 (six) hours as needed for mild pain (pain score 1-3).    [provider]  artificial tears ophthalmic solution 1 drop at bedtime. Patient not taking: Reported on 08/28/2023    [provider]  aspirin  EC 81 MG tablet Take 1 tablet by mouth daily. 07/24/22   [provider]  bumetanide  (BUMEX ) 1 MG tablet Take 4 mg by mouth 2 (two) times daily. 07/11/23   [provider]  Carboxymethylcellulose Sodium 1 % GEL Apply 1 drop to eye at bedtime.    [provider]  carvedilol  (COREG ) 6.25 MG tablet Take 6.25 mg by mouth 2 (two) times daily.    [provider]  cetirizine (ZYRTEC) 10 MG tablet Take 5 mg by mouth daily.    [provider]  cholecalciferol  (VITAMIN D3) 25 MCG (1000 UNIT) tablet Take 1,000 Units by mouth daily.    [provider]  diclofenac  Sodium (VOLTAREN ) 1 % GEL Apply 4 g topically 3 (three) times daily.    [provider]  ezetimibe  (ZETIA ) 10 MG tablet Take 10 mg by mouth daily.  [provider]  ferrous sulfate  325 (65 FE) MG EC tablet Take 325 mg by mouth every other day.    [provider]  fluticasone  (FLONASE ) 50 MCG/ACT nasal spray Place 1 spray into both nostrils 2 (two) times daily.    [provider]  gabapentin  (NEURONTIN ) 100 MG capsule Take 100 mg by mouth 2 (two) times daily.    [provider]  guaiFENesin  (MUCINEX ) 600 MG 12 hr tablet Take 600 mg by mouth 2 (two) times daily.    [provider]  insulin  aspart (NOVOLOG ) 100 UNIT/ML injection Inject into the skin. Give before meals and at bedtime per sliding scale. BS < 80 call MD, BS 201 to 250 give 2 units, BS 251 to 300 give 4 units, BS 301 to 350 give 6 units,  BS 351 to 400 give 8 units, BS 401 to 450 give 10 units, BS 451 to 500 give 12 units, BS > 500 give 14 units and call MD    [provider]  LANTUS  SOLOSTAR 100 UNIT/ML Solostar Pen Inject 10 Units into the skin at bedtime. Hold if sugar < 100 Patient taking differently: Inject 15 Units into the skin at bedtime. Hold if sugar < 100 04/11/21   Maree Hue, MD  latanoprost  (XALATAN ) 0.005 % ophthalmic solution Place 1 drop into both eyes at bedtime.    [provider]  linaclotide  (LINZESS ) 145 MCG CAPS capsule Take 145 mcg by mouth daily.    [provider]  losartan  (COZAAR ) 50 MG tablet Take 1 tablet (50 mg total) by mouth daily. 09/02/23 10/02/23  Trudy Anthony HERO, MD  montelukast  (SINGULAIR ) 10 MG tablet Take 10 mg by mouth daily.    [provider]  Multiple Vitamins-Minerals (DECUBI-VITE) CAPS Take 1 capsule by mouth every morning.    [provider]  nitroGLYCERIN  (NITROSTAT ) 0.4 MG SL tablet Place 0.4 mg under the tongue every 5 (five) minutes as needed for chest pain.    [provider]  omeprazole (PRILOSEC) 40 MG capsule Take 40 mg by mouth 2 (two) times daily.    [provider]  oxyCODONE -acetaminophen  (PERCOCET/ROXICET) 5-325 MG tablet Take 1 tablet by mouth every 8 (eight) hours as needed for moderate pain. 04/20/21   Adhikari, Amrit, MD  polyethylene glycol (MIRALAX  / GLYCOLAX ) 17 g packet Take 17 g by mouth 2 (two) times daily.    [provider]  senna-docusate (SENOKOT-S) 8.6-50 MG tablet Take 2 tablets by mouth at bedtime. 03/28/23   [provider]  simethicone  (MYLICON) 80 MG chewable tablet Chew 80 mg by mouth every 6 (six) hours as needed for flatulence.    [provider]  zinc  oxide 20 % ointment Apply 1 Application topically as needed for irritation.    [provider]    Physical Exam: Vitals:   10/17/23 1428 10/17/23 1429 10/17/23 1827  BP:  (!) 182/61 (!) 175/69  Pulse:   75 76  Resp:  17 18  Temp:  98 F (36.7 C)   TempSrc:  Oral   SpO2:  100% 97%  Weight: 86.2 kg    Height: 5' 3 (1.6 m)     General: Not in acute distress HEENT:       Eyes: PERRL, EOMI, no jaundice       ENT: No discharge from the ears and nose, no pharynx injection, no tonsillar enlargement.        Neck: No JVD, no bruit, no mass felt. Heme: No  neck lymph node enlargement. Cardiac: S1/S2, RRR, No murmurs, No gallops or rubs. Respiratory: No rales, wheezing, rhonchi or rubs. GI: Soft, nondistended, nontender, no rebound pain, no organomegaly, BS present. GU: No hematuria Ext: No pitting leg edema bilaterally. 1+DP/PT pulse bilaterally. Musculoskeletal: No joint deformities, No joint redness or warmth, no limitation of ROM in spin. Skin: No rashes.  Neuro: Alert, oriented X3, cranial nerves II-XII grossly intact, moves all extremities normally. Muscle strength 5/5 in all extremities, sensation to light touch intact. Brachial reflex 2+ bilaterally. Knee reflex 1+ bilaterally. Negative Babinski's sign. Normal finger to nose test. Psych: Patient is not psychotic, no suicidal or hemocidal ideation.  Labs on Admission: I have personally reviewed following labs and imaging studies  CBC: Recent Labs  Lab 10/17/23 1430  WBC 9.5  HGB 9.3*  HCT 29.5*  MCV 94.6  PLT 196   Basic Metabolic Panel: Recent Labs  Lab 10/17/23 1430  NA 131*  K 4.4  CL 93*  CO2 22  GLUCOSE 247*  BUN 35*  CREATININE 2.20*  CALCIUM  8.9   GFR: Estimated Creatinine Clearance: 25.5 mL/min (A) (by C-G formula based on SCr of 2.2 mg/dL (H)). Liver Function Tests: Recent Labs  Lab 10/17/23 1430  AST 32  ALT 14  ALKPHOS 202*  BILITOT 1.1  PROT 7.2  ALBUMIN  3.1*   Recent Labs  Lab 10/17/23 1430  LIPASE 20   No results for input(s): AMMONIA in the last 168 hours. Coagulation Profile: No results for input(s): INR, PROTIME in the last 168 hours. Cardiac Enzymes: No results for input(s):  CKTOTAL, CKMB, CKMBINDEX, TROPONINI in the last 168 hours. BNP (last 3 results) No results for input(s): PROBNP in the last 8760 hours. HbA1C: No results for input(s): HGBA1C in the last 72 hours. CBG: No results for input(s): GLUCAP in the last 168 hours. Lipid Profile: No results for input(s): CHOL, HDL, LDLCALC, TRIG, CHOLHDL, LDLDIRECT in the last 72 hours. Thyroid Function Tests: No results for input(s): TSH, T4TOTAL, FREET4, T3FREE, THYROIDAB in the last 72 hours. Anemia Panel: No results for input(s): VITAMINB12, FOLATE, FERRITIN, TIBC, IRON, RETICCTPCT in the last 72 hours. Urine analysis:    Component Value Date/Time   COLORURINE YELLOW (A) 04/12/2021 1935   APPEARANCEUR HAZY (A) 04/12/2021 1935   LABSPEC 1.010 04/12/2021 1935   PHURINE 5.0 04/12/2021 1935   GLUCOSEU 150 (A) 04/12/2021 1935   HGBUR MODERATE (A) 04/12/2021 1935   BILIRUBINUR NEGATIVE 04/12/2021 1935   KETONESUR 5 (A) 04/12/2021 1935   PROTEINUR 100 (A) 04/12/2021 1935   NITRITE NEGATIVE 04/12/2021 1935   LEUKOCYTESUR TRACE (A) 04/12/2021 1935   Sepsis Labs: @LABRCNTIP (procalcitonin:4,lacticidven:4) )No results found for this or any previous visit (from the past 240 hours).   Radiological Exams on Admission:   Assessment/Plan Active Problems:   * No active hospital problems. *   Assessment and Plan: No notes have been filed under this hospital service. Service: Hospitalist      Active Problems:   * No active hospital problems. *    DVT ppx: SQ Heparin          SQ Lovenox   Code Status: Full code   ***  Family Communication:     not done, no family member is at bed side.              Yes, patient's    at bed side.       by phone   ***  Disposition Plan:  Anticipate discharge back to previous  environment  Consults called:    Admission status and Level of care: :    for obs as inpt        Dispo: The patient is from: {From:23814}               Anticipated d/c is to: {To:23815}              Anticipated d/c date is: {Days:23816}              Patient currently {Medically stable:23817}    Severity of Illness:  {Observation/Inpatient:21159}       Date of Service 10/17/2023    Caleb Exon [facility]   If 7PM-7AM, please contact night-coverage [email or web address] 10/17/2023, 7:47 PM

## 2023-10-17 NOTE — ED Triage Notes (Signed)
 Patient to ED via [facility] from [facility] for abd pain with nausea and vomiting. Ongoing x2 days. Patient had full dialysis treatment this AM and has not ate anything today. Pt also c/o bilateral leg pain from the staff at [facility] moving her last week- had x-rays that were negative per staff. Wears 2L [place] at baseline.

## 2023-10-17 NOTE — ED Notes (Signed)
 Patient taken to imaging.

## 2023-10-17 NOTE — ED Notes (Signed)
 CCMD called at this time and placed pt on monitor.
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
## 2023-10-18 ENCOUNTER — Encounter: Payer: Self-pay | Admitting: Internal Medicine

## 2023-10-18 DIAGNOSIS — R109 Unspecified abdominal pain: Secondary | ICD-10-CM | POA: Diagnosis not present

## 2023-10-18 LAB — CBC
HCT: 26 % — ABNORMAL LOW (ref 36.0–46.0)
Hemoglobin: 8.3 g/dL — ABNORMAL LOW (ref 12.0–15.0)
MCH: 30.2 pg (ref 26.0–34.0)
MCHC: 31.9 g/dL (ref 30.0–36.0)
MCV: 94.5 fL (ref 80.0–100.0)
Platelets: 183 K/uL (ref 150–400)
RBC: 2.75 MIL/uL — ABNORMAL LOW (ref 3.87–5.11)
RDW: 14.2 % (ref 11.5–15.5)
WBC: 6.7 K/uL (ref 4.0–10.5)
nRBC: 0 % (ref 0.0–0.2)

## 2023-10-18 LAB — GLUCOSE, CAPILLARY
Glucose-Capillary: 133 mg/dL — ABNORMAL HIGH (ref 70–99)
Glucose-Capillary: 135 mg/dL — ABNORMAL HIGH (ref 70–99)

## 2023-10-18 LAB — BASIC METABOLIC PANEL WITH GFR
Anion gap: 12 (ref 5–15)
BUN: 45 mg/dL — ABNORMAL HIGH (ref 8–23)
CO2: 28 mmol/L (ref 22–32)
Calcium: 8.8 mg/dL — ABNORMAL LOW (ref 8.9–10.3)
Chloride: 95 mmol/L — ABNORMAL LOW (ref 98–111)
Creatinine, Ser: 2.72 mg/dL — ABNORMAL HIGH (ref 0.44–1.00)
GFR, Estimated: 18 mL/min — ABNORMAL LOW (ref >60)
Glucose, Bld: 158 mg/dL — ABNORMAL HIGH (ref 70–99)
Potassium: 4.1 mmol/L (ref 3.5–5.1)
Sodium: 135 mmol/L (ref 135–145)

## 2023-10-18 MED ORDER — FLUTICASONE PROPIONATE 50 MCG/ACT NA SUSP: 1.0000 | Freq: Two times a day (BID) | NASAL | Status: DC | PRN

## 2023-10-18 MED ORDER — HYDRALAZINE HCL 50 MG PO TABS: 100.0000 mg | ORAL_TABLET | Freq: Four times a day (QID) | ORAL | Status: DC | PRN

## 2023-10-18 MED ORDER — LINACLOTIDE 145 MCG PO CAPS
145.0000 ug | ORAL_CAPSULE | Freq: Every day | ORAL | Status: DC
  Administered 2023-10-18: 145 ug via ORAL
  Filled 2023-10-18: qty 1

## 2023-10-18 MED ORDER — NITROGLYCERIN 0.4 MG SL SUBL: 0.4000 mg | SUBLINGUAL_TABLET | SUBLINGUAL | Status: DC | PRN

## 2023-10-18 MED ORDER — LATANOPROST 0.005 % OP SOLN
1.0000 [drp] | Freq: Every day | OPHTHALMIC | Status: DC
  Filled 2023-10-18: qty 2.5

## 2023-10-18 MED ORDER — CHLORHEXIDINE GLUCONATE CLOTH 2 % EX PADS
6.0000 | MEDICATED_PAD | Freq: Every day | CUTANEOUS | Status: DC
  Administered 2023-10-18: 6 via TOPICAL

## 2023-10-18 MED ORDER — ASPIRIN 81 MG PO TBEC
81.0000 mg | DELAYED_RELEASE_TABLET | Freq: Every day | ORAL | Status: DC
  Administered 2023-10-18: 81 mg via ORAL
  Filled 2023-10-18: qty 1

## 2023-10-18 MED ORDER — CEPHALEXIN 500 MG PO CAPS
500.0000 mg | ORAL_CAPSULE | Freq: Three times a day (TID) | ORAL | Status: DC
  Administered 2023-10-18 (×2): 500 mg via ORAL
  Filled 2023-10-18 (×2): qty 1

## 2023-10-18 MED ORDER — INSULIN GLARGINE 100 UNIT/ML ~~LOC~~ SOLN
10.0000 [IU] | Freq: Every day | SUBCUTANEOUS | Status: DC
  Filled 2023-10-18: qty 0.1

## 2023-10-18 MED ORDER — BUMETANIDE 1 MG PO TABS
4.0000 mg | ORAL_TABLET | Freq: Two times a day (BID) | ORAL | Status: DC
  Administered 2023-10-18: 4 mg via ORAL
  Filled 2023-10-18: qty 4

## 2023-10-18 MED ORDER — CARVEDILOL 3.125 MG PO TABS
6.2500 mg | ORAL_TABLET | Freq: Two times a day (BID) | ORAL | Status: DC
  Administered 2023-10-18: 6.25 mg via ORAL
  Filled 2023-10-18: qty 2

## 2023-10-18 MED ORDER — CEPHALEXIN 250 MG PO CAPS: 250.0000 mg | ORAL_CAPSULE | Freq: Every day | ORAL | 0 refills | Status: DC

## 2023-10-18 MED ORDER — OXYCODONE-ACETAMINOPHEN 5-325 MG PO TABS: 1.0000 | ORAL_TABLET | Freq: Three times a day (TID) | ORAL | Status: DC | PRN

## 2023-10-18 MED ORDER — ADULT MULTIVITAMIN W/MINERALS CH
1.0000 | ORAL_TABLET | Freq: Every morning | ORAL | Status: DC
  Administered 2023-10-18: 1 via ORAL
  Filled 2023-10-18: qty 1

## 2023-10-18 MED ORDER — EZETIMIBE 10 MG PO TABS
10.0000 mg | ORAL_TABLET | Freq: Every day | ORAL | Status: DC
Start: 2023-10-18 — End: 2023-10-18
  Administered 2023-10-18: 10 mg via ORAL
  Filled 2023-10-18: qty 1

## 2023-10-18 MED ORDER — ACETAMINOPHEN 325 MG PO TABS
650.0000 mg | ORAL_TABLET | Freq: Four times a day (QID) | ORAL | Status: AC | PRN
Start: 2023-10-18 — End: ?

## 2023-10-18 MED ORDER — MONTELUKAST SODIUM 10 MG PO TABS
10.0000 mg | ORAL_TABLET | Freq: Every day | ORAL | Status: DC
  Administered 2023-10-18: 10 mg via ORAL
  Filled 2023-10-18: qty 1

## 2023-10-18 MED ORDER — SIMETHICONE 80 MG PO CHEW: 80.0000 mg | CHEWABLE_TABLET | Freq: Four times a day (QID) | ORAL | Status: DC | PRN

## 2023-10-18 MED ORDER — PANTOPRAZOLE SODIUM 40 MG PO TBEC
40.0000 mg | DELAYED_RELEASE_TABLET | Freq: Every day | ORAL | Status: DC
  Administered 2023-10-18: 40 mg via ORAL
  Filled 2023-10-18: qty 1

## 2023-10-18 MED ORDER — LORATADINE 10 MG PO TABS
10.0000 mg | ORAL_TABLET | Freq: Every day | ORAL | Status: DC
  Administered 2023-10-18: 10 mg via ORAL
  Filled 2023-10-18: qty 1

## 2023-10-18 MED ORDER — VITAMIN D 25 MCG (1000 UNIT) PO TABS
1000.0000 [IU] | ORAL_TABLET | Freq: Every day | ORAL | Status: DC
  Administered 2023-10-18: 1000 [IU] via ORAL
  Filled 2023-10-18: qty 1

## 2023-10-18 MED ORDER — FERROUS SULFATE 325 (65 FE) MG PO TABS
325.0000 mg | ORAL_TABLET | ORAL | Status: DC
Start: 2023-10-19 — End: 2023-10-18

## 2023-10-18 MED ORDER — SENNOSIDES-DOCUSATE SODIUM 8.6-50 MG PO TABS
2.0000 | ORAL_TABLET | Freq: Every day | ORAL | Status: DC
  Administered 2023-10-18: 2 via ORAL
  Filled 2023-10-18: qty 2

## 2023-10-18 MED ORDER — POLYVINYL ALCOHOL 1.4 % OP SOLN: 1.0000 [drp] | Freq: Every evening | OPHTHALMIC | Status: DC | PRN

## 2023-10-18 MED ORDER — LOSARTAN POTASSIUM 50 MG PO TABS
50.0000 mg | ORAL_TABLET | Freq: Every day | ORAL | Status: DC
  Administered 2023-10-18: 50 mg via ORAL
  Filled 2023-10-18: qty 1

## 2023-10-18 MED ORDER — SODIUM CHLORIDE 0.9 % IV SOLN
2.0000 g | INTRAVENOUS | Status: DC
  Filled 2023-10-18: qty 20

## 2023-10-18 MED ORDER — ZINC OXIDE 40 % EX OINT
TOPICAL_OINTMENT | Freq: Two times a day (BID) | CUTANEOUS | Status: DC
  Filled 2023-10-18: qty 113

## 2023-10-18 MED ORDER — POLYETHYLENE GLYCOL 3350 17 G PO PACK
17.0000 g | PACK | Freq: Two times a day (BID) | ORAL | Status: DC
  Administered 2023-10-18: 17 g via ORAL
  Filled 2023-10-18: qty 1

## 2023-10-18 MED ORDER — ACETAMINOPHEN 325 MG PO TABS
975.0000 mg | ORAL_TABLET | Freq: Three times a day (TID) | ORAL | Status: DC | PRN
  Administered 2023-10-18 (×2): 975 mg via ORAL
  Filled 2023-10-18 (×2): qty 3

## 2023-10-18 MED ORDER — GABAPENTIN 100 MG PO CAPS
100.0000 mg | ORAL_CAPSULE | Freq: Two times a day (BID) | ORAL | Status: DC
  Administered 2023-10-18 (×2): 100 mg via ORAL
  Filled 2023-10-18 (×2): qty 1

## 2023-10-18 NOTE — Consult Note (Addendum)
 WOC Nurse Consult Note: Reason for Consult: wounds  Wound type: 1.  L elbow full thickness likely r/t trauma dry scabbed 2.  Sacrum/upper buttocks Stage 1 Pressure Injury  3.  L lower leg full thickness likely r/t traumatic dry hemorrhagic tissue  4. Partial thickness L and R groin pink moist likely r/t use of adult briefs  5.  Intertriginous dermatitis B breasts with erythema and scattered partial thickness skin loss red moist  ICD-10 CM Codes for Irritant Dermatitis  L30.4  - Erythema intertrigo. Also used for abrasion of the hand, chafing of the skin, dermatitis due to sweating and friction, friction dermatitis, friction eczema, and genital/thigh intertrigo.  Pressure Injury POA: Yes sacrum  Measurement: see nursing flowsheet  Wound bed: as above  Drainage (amount, consistency, odor) see nursing flowsheet; all appear largely dry  Periwound: Dressing procedure/placement/frequency:  Cleanse L elbow and L lower leg wounds with NS, apply Xeroform gauze Soila [phone]) every other day and secure with silicone foam.   Cleanse underneath breasts with soap and water, dry and apply Interdry AG as follows: Order Gerlean # [phone] Measure and cut length of InterDry to fit in skin folds that have skin breakdown Tuck InterDry fabric into skin folds in a single layer, allow for 2 inches of overhang from skin edges to allow for wicking to occur May remove to bathe; dry area thoroughly and then tuck into affected areas again Do not apply any creams or ointments when using InterDry DO NOT THROW AWAY FOR 5 DAYS unless soiled with stool DO NOT [facility] product, this will inactivate the silver in the material  New sheet of Interdry should be applied after 5 days of use if patient continues to have skin breakdown   Will write for Desitin to sacrum/buttocks and inner thighs 2 times daily.   Avoid use of adult briefs.   POC discussed with bedside nurse. WOC team will not follow. Re-consult if further needs arise.    Thank you,    Powell Bar MSN, RN-BC, [employer]

## 2023-10-18 NOTE — Discharge Instructions (Signed)
 No weight bearing right leg
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
## 2023-10-18 NOTE — Progress Notes (Signed)
 Please place BP cuff BELOW IV when checking pressures. Placing in upper arm if receiving IV infusion will cause IV to infiltrate.  Krishav Mamone R Drayton Tieu, RN

## 2023-10-18 NOTE — Plan of Care (Signed)

## 2023-10-18 NOTE — TOC Transition Note (Signed)
 Transition of Care [facility]) - Discharge Note   Patient Details  Name: Katrina Ortiz MRN: [id number] Date of Birth: 16-Apr-1955  [facility]) CM/SW Contact:  Alvaro Louder, LCSW Phone Number: 10/18/2023, 1:12 PM   Clinical Narrative:     Patient from LTC living at [facility]. LCSWA confirmed with MD that patient is stable for discharge. LCSWA notified the patient and they are in agreement with discharge. LCSWA confirmed bed is available at [facility]. Transport arranged with [facility] for next available.  TOC signing off  RM: F-15, Number to call report [phone]   Final next level of care: Long Term Nursing Home Barriers to Discharge: No Barriers Identified   Patient Goals and CMS Choice            Discharge Placement              Patient chooses bed at:  ([facility]) Patient to be transferred to facility by: [facility] Name of family member notified: Self Patient and family notified of of transfer: 10/18/23  Discharge Plan and Services Additional resources added to the After Visit Summary for                                       Social Drivers of Health (SDOH) Interventions SDOH Screenings   Food Insecurity: No Food Insecurity (10/18/2023)  Housing: High Risk (10/18/2023)  Transportation Needs: No Transportation Needs (10/18/2023)  Utilities: Not At Risk (10/18/2023)  Financial Resource Strain: Low Risk  (07/06/2023)   Received from [facility]  Physical Activity: Inactive (07/08/2020)   Received from [facility]  Social Connections: Unknown (10/18/2023)  Stress: No Stress Concern Present (07/08/2020)   Received from [facility]  Tobacco Use: Medium Risk (10/17/2023)     Readmission Risk Interventions    04/16/2021   10:42 AM  Readmission Risk Prevention Plan  Transportation Screening Complete  PCP or Specialist Appt within 3-5 Days Complete  Social Work Consult for Recovery Care Planning/Counseling Complete  Palliative Care  Screening Not Applicable  Medication Review [employer]) Complete

## 2023-10-18 NOTE — Care Management Obs Status (Signed)
 MEDICARE OBSERVATION STATUS NOTIFICATION   Patient Details  Name: Katrina Ortiz MRN: [id number] Date of Birth: [birth year]   Medicare Observation Status Notification Given:  Yes    Kaylia Winborne W, CMA 10/18/2023, 10:38 AM

## 2023-10-18 NOTE — Plan of Care (Signed)
  Problem: Metabolic: Goal: Ability to maintain appropriate glucose levels will improve Outcome: Progressing   Problem: Nutritional: Goal: Maintenance of adequate nutrition will improve Outcome: Progressing   Problem: Health Behavior/Discharge Planning: Goal: Ability to manage health-related needs will improve Outcome: Progressing   Problem: Coping: Goal: Level of anxiety will decrease Outcome: Progressing   Problem: Elimination: Goal: Will not experience complications related to urinary retention Outcome: Progressing   Problem: Pain Managment: Goal: General experience of comfort will improve and/or be controlled Outcome: Progressing

## 2023-10-20 ENCOUNTER — Inpatient Hospital Stay
Admission: EM | Admit: 2023-10-20 | Discharge: 2023-10-26 | DRG: 689 | Disposition: A | Discharge status: Skilled Nursing Facility | Source: Skilled Nursing Facility | Attending: Internal Medicine | Admitting: Internal Medicine

## 2023-10-20 ENCOUNTER — Emergency Department

## 2023-10-20 DIAGNOSIS — R9431 Abnormal electrocardiogram [ECG] [EKG]: Secondary | ICD-10-CM | POA: Diagnosis present

## 2023-10-20 DIAGNOSIS — A419 Sepsis, unspecified organism: Secondary | ICD-10-CM | POA: Diagnosis present

## 2023-10-20 DIAGNOSIS — Z992 Dependence on renal dialysis: Secondary | ICD-10-CM | POA: Diagnosis not present

## 2023-10-20 DIAGNOSIS — E875 Hyperkalemia: Secondary | ICD-10-CM | POA: Diagnosis present

## 2023-10-20 DIAGNOSIS — Z91014 Allergy to mammalian meats: Secondary | ICD-10-CM

## 2023-10-20 DIAGNOSIS — D631 Anemia in chronic kidney disease: Secondary | ICD-10-CM | POA: Diagnosis present

## 2023-10-20 DIAGNOSIS — E1122 Type 2 diabetes mellitus with diabetic chronic kidney disease: Secondary | ICD-10-CM | POA: Diagnosis present

## 2023-10-20 DIAGNOSIS — L409 Psoriasis, unspecified: Secondary | ICD-10-CM | POA: Diagnosis present

## 2023-10-20 DIAGNOSIS — X58XXXD Exposure to other specified factors, subsequent encounter: Secondary | ICD-10-CM | POA: Diagnosis present

## 2023-10-20 DIAGNOSIS — S82141S Displaced bicondylar fracture of right tibia, sequela: Secondary | ICD-10-CM

## 2023-10-20 DIAGNOSIS — I2510 Atherosclerotic heart disease of native coronary artery without angina pectoris: Secondary | ICD-10-CM | POA: Diagnosis present

## 2023-10-20 DIAGNOSIS — R7989 Other specified abnormal findings of blood chemistry: Secondary | ICD-10-CM

## 2023-10-20 DIAGNOSIS — D509 Iron deficiency anemia, unspecified: Secondary | ICD-10-CM | POA: Diagnosis present

## 2023-10-20 DIAGNOSIS — Z8051 Family history of malignant neoplasm of kidney: Secondary | ICD-10-CM

## 2023-10-20 DIAGNOSIS — E872 Acidosis, unspecified: Secondary | ICD-10-CM | POA: Diagnosis present

## 2023-10-20 DIAGNOSIS — N2581 Secondary hyperparathyroidism of renal origin: Secondary | ICD-10-CM | POA: Diagnosis present

## 2023-10-20 DIAGNOSIS — I5A Non-ischemic myocardial injury (non-traumatic): Secondary | ICD-10-CM | POA: Diagnosis present

## 2023-10-20 DIAGNOSIS — E669 Obesity, unspecified: Secondary | ICD-10-CM | POA: Diagnosis present

## 2023-10-20 DIAGNOSIS — Z1612 Extended spectrum beta lactamase (ESBL) resistance: Secondary | ICD-10-CM | POA: Diagnosis present

## 2023-10-20 DIAGNOSIS — R1084 Generalized abdominal pain: Secondary | ICD-10-CM | POA: Diagnosis not present

## 2023-10-20 DIAGNOSIS — Z1624 Resistance to multiple antibiotics: Secondary | ICD-10-CM | POA: Diagnosis present

## 2023-10-20 DIAGNOSIS — N186 End stage renal disease: Secondary | ICD-10-CM | POA: Diagnosis present

## 2023-10-20 DIAGNOSIS — Z8249 Family history of ischemic heart disease and other diseases of the circulatory system: Secondary | ICD-10-CM

## 2023-10-20 DIAGNOSIS — I959 Hypotension, unspecified: Secondary | ICD-10-CM

## 2023-10-20 DIAGNOSIS — Z87891 Personal history of nicotine dependence: Secondary | ICD-10-CM

## 2023-10-20 DIAGNOSIS — Z7982 Long term (current) use of aspirin: Secondary | ICD-10-CM

## 2023-10-20 DIAGNOSIS — Z66 Do not resuscitate: Secondary | ICD-10-CM | POA: Diagnosis present

## 2023-10-20 DIAGNOSIS — B9620 Unspecified Escherichia coli [E. coli] as the cause of diseases classified elsewhere: Secondary | ICD-10-CM | POA: Diagnosis present

## 2023-10-20 DIAGNOSIS — R5381 Other malaise: Secondary | ICD-10-CM | POA: Diagnosis present

## 2023-10-20 DIAGNOSIS — S82141A Displaced bicondylar fracture of right tibia, initial encounter for closed fracture: Secondary | ICD-10-CM | POA: Diagnosis present

## 2023-10-20 DIAGNOSIS — N390 Urinary tract infection, site not specified: Secondary | ICD-10-CM | POA: Diagnosis not present

## 2023-10-20 DIAGNOSIS — E871 Hypo-osmolality and hyponatremia: Secondary | ICD-10-CM | POA: Diagnosis present

## 2023-10-20 DIAGNOSIS — Z79899 Other long term (current) drug therapy: Secondary | ICD-10-CM

## 2023-10-20 DIAGNOSIS — Z794 Long term (current) use of insulin: Secondary | ICD-10-CM

## 2023-10-20 DIAGNOSIS — B9629 Other Escherichia coli [E. coli] as the cause of diseases classified elsewhere: Secondary | ICD-10-CM | POA: Diagnosis not present

## 2023-10-20 DIAGNOSIS — I132 Hypertensive heart and chronic kidney disease with heart failure and with stage 5 chronic kidney disease, or end stage renal disease: Secondary | ICD-10-CM | POA: Diagnosis present

## 2023-10-20 DIAGNOSIS — R109 Unspecified abdominal pain: Secondary | ICD-10-CM | POA: Diagnosis present

## 2023-10-20 DIAGNOSIS — I9589 Other hypotension: Secondary | ICD-10-CM | POA: Diagnosis not present

## 2023-10-20 DIAGNOSIS — S82141D Displaced bicondylar fracture of right tibia, subsequent encounter for closed fracture with routine healing: Secondary | ICD-10-CM

## 2023-10-20 DIAGNOSIS — Z955 Presence of coronary angioplasty implant and graft: Secondary | ICD-10-CM

## 2023-10-20 DIAGNOSIS — N3090 Cystitis, unspecified without hematuria: Secondary | ICD-10-CM | POA: Diagnosis present

## 2023-10-20 DIAGNOSIS — W19XXXA Unspecified fall, initial encounter: Secondary | ICD-10-CM | POA: Diagnosis present

## 2023-10-20 DIAGNOSIS — A499 Bacterial infection, unspecified: Principal | ICD-10-CM

## 2023-10-20 DIAGNOSIS — Z6833 Body mass index (BMI) 33.0-33.9, adult: Secondary | ICD-10-CM

## 2023-10-20 DIAGNOSIS — E785 Hyperlipidemia, unspecified: Secondary | ICD-10-CM | POA: Diagnosis present

## 2023-10-20 DIAGNOSIS — K8020 Calculus of gallbladder without cholecystitis without obstruction: Secondary | ICD-10-CM | POA: Diagnosis present

## 2023-10-20 DIAGNOSIS — I5032 Chronic diastolic (congestive) heart failure: Secondary | ICD-10-CM | POA: Diagnosis present

## 2023-10-20 DIAGNOSIS — I953 Hypotension of hemodialysis: Secondary | ICD-10-CM | POA: Diagnosis present

## 2023-10-20 DIAGNOSIS — K219 Gastro-esophageal reflux disease without esophagitis: Secondary | ICD-10-CM | POA: Diagnosis present

## 2023-10-20 DIAGNOSIS — Z951 Presence of aortocoronary bypass graft: Secondary | ICD-10-CM

## 2023-10-20 DIAGNOSIS — I2489 Other forms of acute ischemic heart disease: Secondary | ICD-10-CM | POA: Diagnosis present

## 2023-10-20 LAB — CBC
HCT: 25.4 % — ABNORMAL LOW (ref 36.0–46.0)
Hemoglobin: 7.8 g/dL — ABNORMAL LOW (ref 12.0–15.0)
MCH: 29.5 pg (ref 26.0–34.0)
MCHC: 30.7 g/dL (ref 30.0–36.0)
MCV: 96.2 fL (ref 80.0–100.0)
Platelets: 174 K/uL (ref 150–400)
RBC: 2.64 MIL/uL — ABNORMAL LOW (ref 3.87–5.11)
RDW: 14.2 % (ref 11.5–15.5)
WBC: 7.4 K/uL (ref 4.0–10.5)
nRBC: 0 % (ref 0.0–0.2)

## 2023-10-20 LAB — COMPREHENSIVE METABOLIC PANEL WITH GFR
ALT: 11 U/L (ref 0–44)
AST: 20 U/L (ref 15–41)
Albumin: 2.7 g/dL — ABNORMAL LOW (ref 3.5–5.0)
Alkaline Phosphatase: 171 U/L — ABNORMAL HIGH (ref 38–126)
Anion gap: 13 (ref 5–15)
BUN: 54 mg/dL — ABNORMAL HIGH (ref 8–23)
CO2: 25 mmol/L (ref 22–32)
Calcium: 8.5 mg/dL — ABNORMAL LOW (ref 8.9–10.3)
Chloride: 93 mmol/L — ABNORMAL LOW (ref 98–111)
Creatinine, Ser: 3.65 mg/dL — ABNORMAL HIGH (ref 0.44–1.00)
GFR, Estimated: 13 mL/min — ABNORMAL LOW (ref >60)
Glucose, Bld: 180 mg/dL — ABNORMAL HIGH (ref 70–99)
Potassium: 5.2 mmol/L — ABNORMAL HIGH (ref 3.5–5.1)
Sodium: 131 mmol/L — ABNORMAL LOW (ref 135–145)
Total Bilirubin: 1.5 mg/dL — ABNORMAL HIGH (ref 0.0–1.2)
Total Protein: 6.3 g/dL — ABNORMAL LOW (ref 6.5–8.1)

## 2023-10-20 LAB — GLUCOSE, CAPILLARY
Glucose-Capillary: 136 mg/dL — ABNORMAL HIGH (ref 70–99)
Glucose-Capillary: 169 mg/dL — ABNORMAL HIGH (ref 70–99)

## 2023-10-20 LAB — URINE CULTURE: Culture: >=100000 — AB

## 2023-10-20 LAB — LIPASE, BLOOD: Lipase: 20 U/L (ref 11–51)

## 2023-10-20 LAB — LACTIC ACID, PLASMA
Lactic Acid, Venous: 3.3 mmol/L (ref 0.5–1.9)
Lactic Acid, Venous: 0.9 mmol/L (ref 0.5–1.9)

## 2023-10-20 LAB — TROPONIN I (HIGH SENSITIVITY)
Troponin I (High Sensitivity): 334 ng/L (ref <18)
Troponin I (High Sensitivity): 335 ng/L (ref <18)

## 2023-10-20 IMAGING — CT CT CHEST-ABD-PELV W/O CM
2 of 4 series · 14 of 36 positions shown, 16 images · non-contrast
Comparison: Chest CT without contrast 04/09/2021, CT abdomen pelvis
with IV contrast 02/27/2019

CLINICAL DATA: Shortness of breath, weakness and nausea.



[Series 2: cap wo st · axial · 0.98mm/px · z∈[+256,+776]mm · 11 of 124 slices shown, 13 images]
[im 10/124  mediastinal]
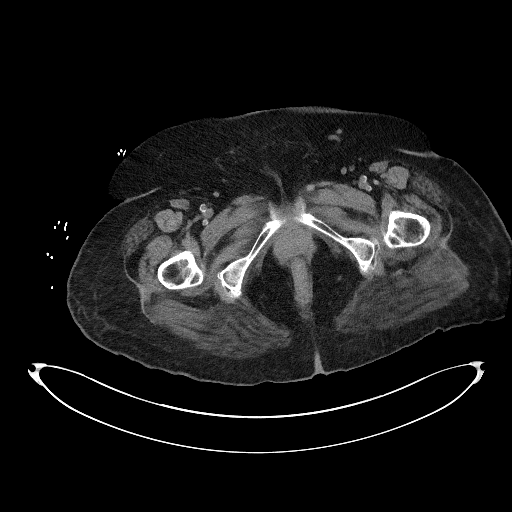
[im 10/124  bone]
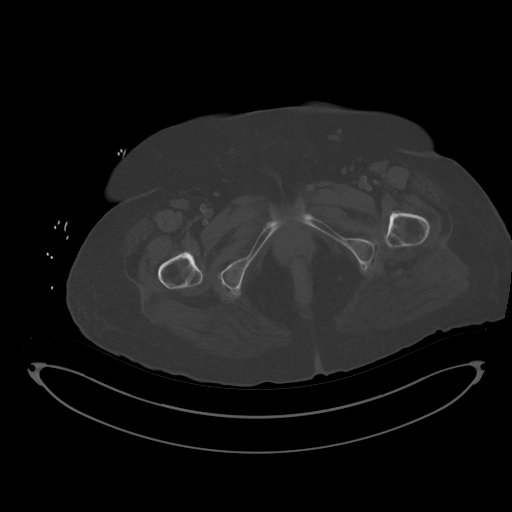
[im 19/124  mediastinal]
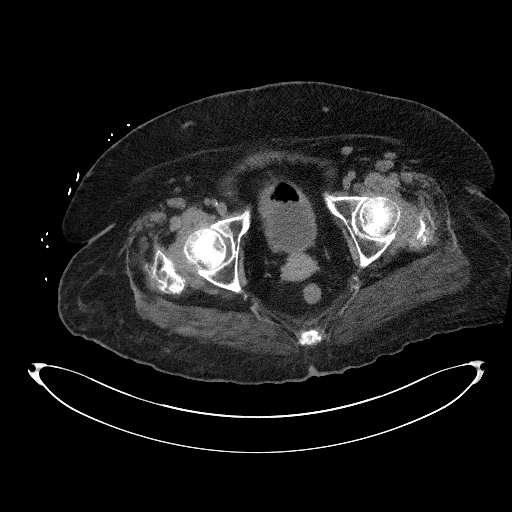
[im 29/124  mediastinal]
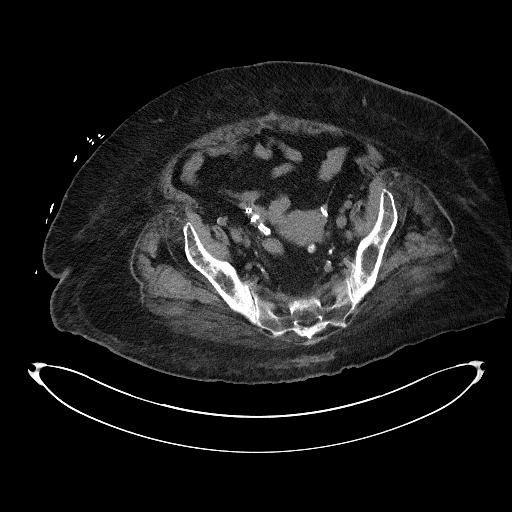
[im 38/124  mediastinal]
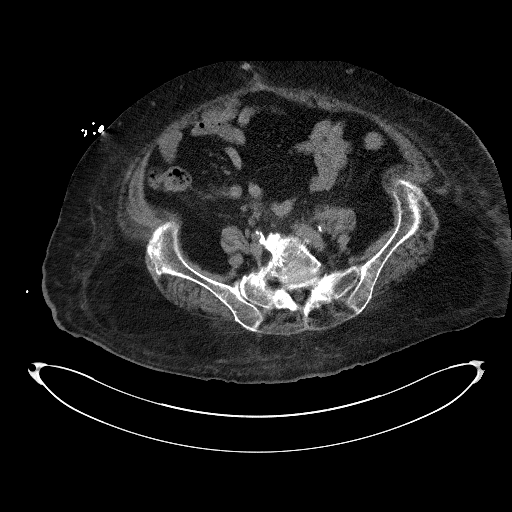
[im 48/124  mediastinal]
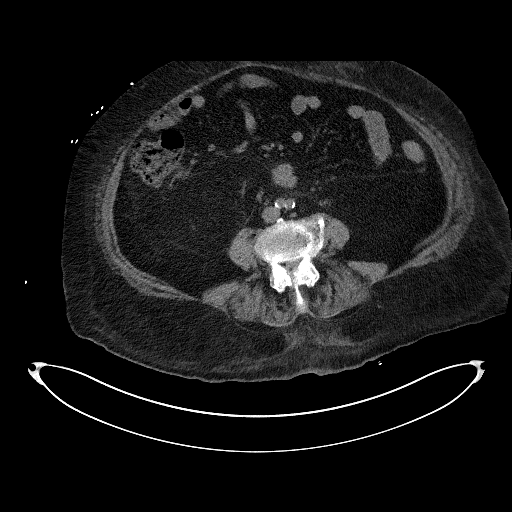
[im 67/124  mediastinal]
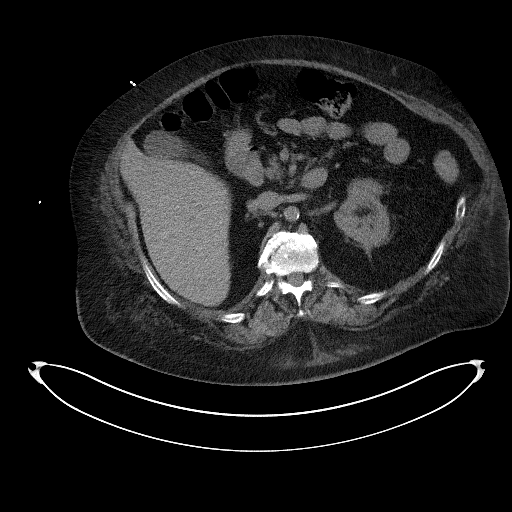
[im 76/124  mediastinal]
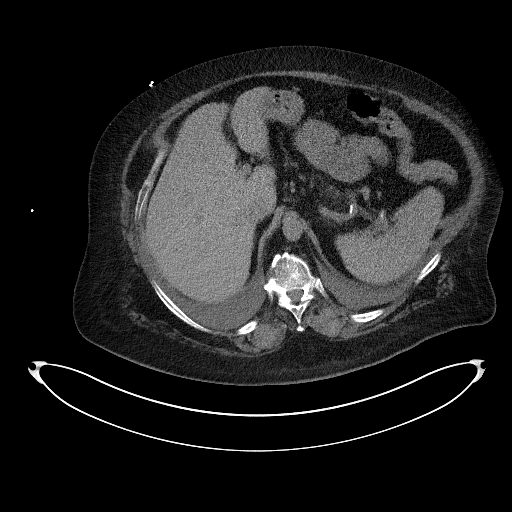
[im 86/124  mediastinal]
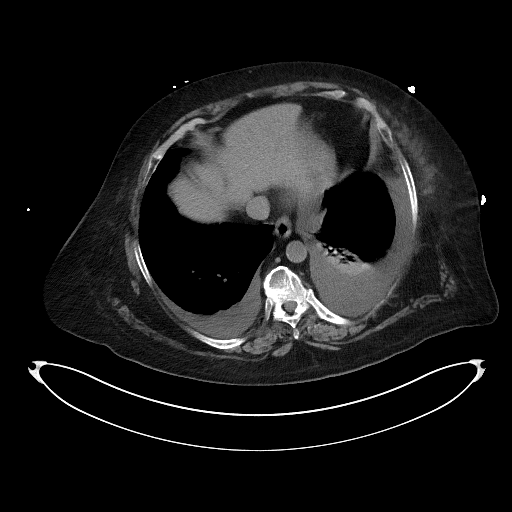
[im 95/124  mediastinal]
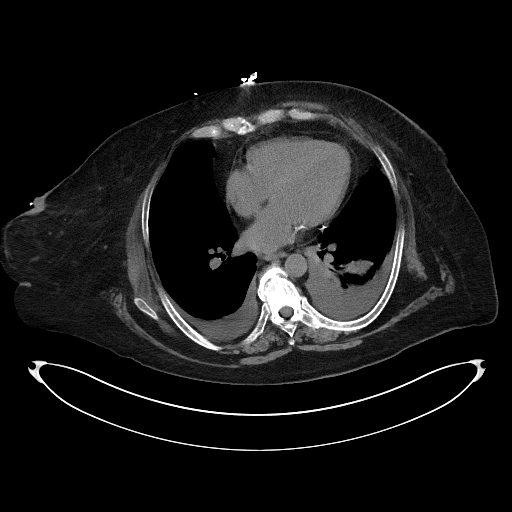
[im 95/124  bone]
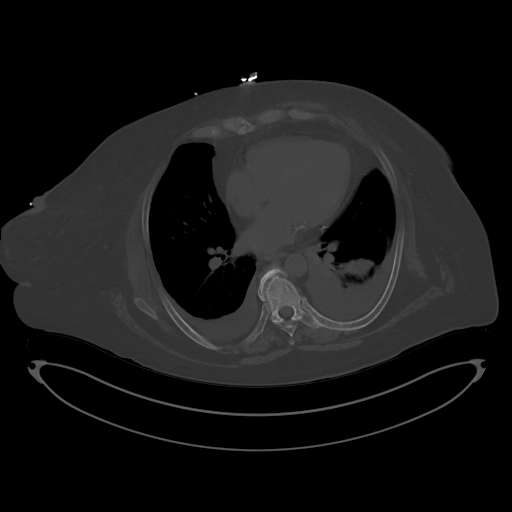
[im 105/124  mediastinal]
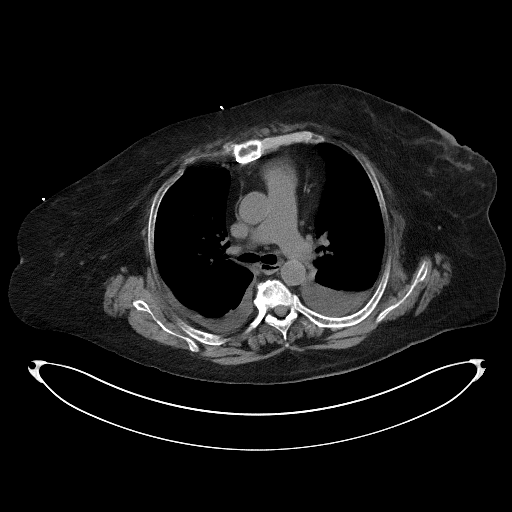
[im 114/124  mediastinal]
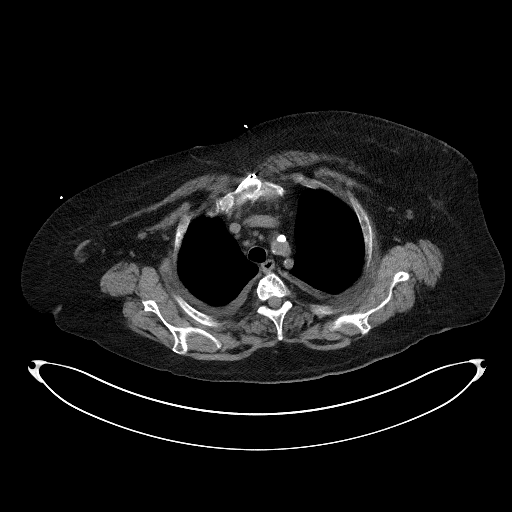

[Series 5: coronal · coronal · 0.96mm/px · 3 of 164 slices shown]
[im 33/164  mediastinal]
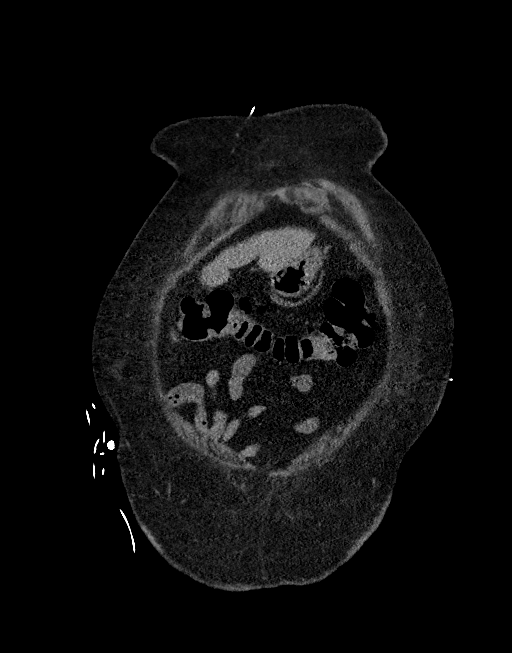
[im 66/164  mediastinal]
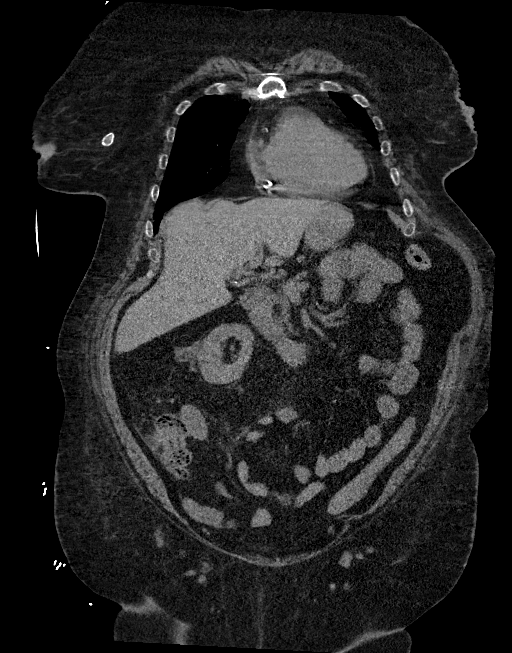
[im 98/164  mediastinal]
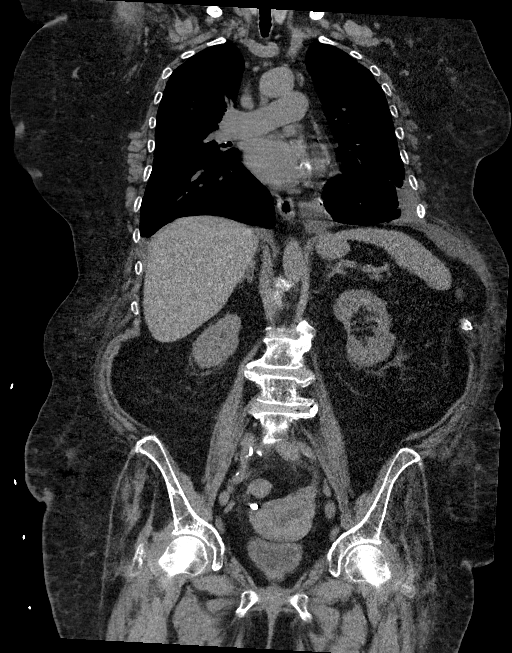

[14 of 36 positions shown; findings below may reference images not displayed]

FINDINGS: CT CHEST FINDINGS

Cardiovascular: The cardiac size is upper-normal. Pulmonary veins
are normal caliber and there is no significant pericardial effusion.

There is heavy native three-vessel calcific CAD with sternotomy
changes and old CABG. There is aortic atherosclerosis without
aneurysm. The pulmonary arteries are normal caliber.

Mediastinum/Nodes: The lower poles of the thyroid gland, trachea,
thoracic esophagus are unremarkable. Axillary spaces are clear. No
intrathoracic adenopathy is seen.

Lungs/Pleura: There is a moderate-sized left and small right
layering pleural effusions, both increased. Adjacent consolidation
or atelectasis in left lower lobe appears similar to the previous
exam as well as posterior atelectasis in the right lower lobe.

Scar-like opacities are again seen in the right middle lobe with
additional ground-glass pneumonitis or atelectasis posteriorly in
the right upper lobe also unchanged in appearance.

Left upper lobe is clear except for atelectasis in the lingular
base. The central airways are clear.

Musculoskeletal: There is extensive bridging enthesopathy of the
thoracic spine of DISH. Osteopenia.

CT ABDOMEN PELVIS FINDINGS

Hepatobiliary: 19 cm length mildly steatotic liver, otherwise
unremarkable without contrast. Gallbladder and bile ducts are
unremarkable.

Pancreas: Partially atrophic, otherwise unremarkable without
contrast.

Spleen: Normal in size and noncontrast attenuation.

Adrenals/Urinary Tract: There is no adrenal mass. Wedge-shaped
scar-like defect noted in the left upper renal cortex, otherwise no
focal abnormality in the unenhanced renal cortex is seen.

Perinephric stranding is similar to prior study as well as trace
perinephric fluid. There is no evidence of urinary stone or
obstruction. The bladder wall is thickened but not fully distended
and there is air in the bladder.

Stomach/Bowel: No dilatation or wall thickening including the
appendix. Scattered colonic diverticula without focal inflammation.

Vascular/Lymphatic: Aortic atherosclerosis. No enlarged abdominal or
pelvic lymph nodes.

Reproductive: The uterus is intact. There are BTL changes and a
subserosal 3.1 cm fundal fibroid again noted of the right. No
adnexal mass is seen. The endometrium is more prominent than usually
seen at this age, measuring 8 mm. A similar finding was seen
previously. Nonemergent ultrasound follow-up is recommended.

Other: Small umbilical fat hernia. There is trace posterior deep
pelvic ascites. There is no free air, hemorrhage or abscess.

Musculoskeletal: Advanced facet hypertrophy lower lumbar spine with
chronic grade 1 L4-5 degenerative anterolisthesis and acquired
spinal stenosis. Similar severe spinal stenosis L3-4. No destructive
bone lesion is seen.
IMPRESSION: 1. Moderate left and small layering right pleural effusions, both
increased from 5 days ago. Adjacent atelectasis or consolidation in
the left lower lobe appears similar.
2. Resolution of small ground-glass infiltrates in the posterior
left upper lobe previously with similar appearance of ground-glass
atelectasis or infiltrates in the posterior right upper lobe.
3. Prior CABG, with upper-normal heart size, aortic and coronary
artery atherosclerosis.
4. Cystitis versus bladder nondistention, with air in the bladder
which could be due to instrumentation or infectious process.
Correlate clinically with urinalysis.
5. Additional chronic findings in the abdomen and pelvis with no
other new abnormality.
6. Prominent endometrium for age. A similar finding was noted
previously. Nonemergent ultrasound follow-up recommended.
7. Trace ascites.
8. Osteopenia and degenerative change with severe acquired spinal
stenosis L3-4 and L4-5.

## 2023-10-20 MED ORDER — SODIUM CHLORIDE 0.9 % IV SOLN: 1.0000 g | INTRAVENOUS | Status: DC

## 2023-10-20 MED ORDER — ONDANSETRON HCL 4 MG/2ML IJ SOLN
4.0000 mg | Freq: Once | INTRAMUSCULAR | Status: AC | PRN
  Administered 2023-10-21: 4 mg via INTRAVENOUS
  Filled 2023-10-20: qty 2

## 2023-10-20 MED ORDER — FLUTICASONE PROPIONATE 50 MCG/ACT NA SUSP
1.0000 | Freq: Two times a day (BID) | NASAL | Status: DC
  Administered 2023-10-20 – 2023-10-26 (×11): 1 via NASAL
  Filled 2023-10-20: qty 16

## 2023-10-20 MED ORDER — GUAIFENESIN ER 600 MG PO TB12
600.0000 mg | ORAL_TABLET | Freq: Two times a day (BID) | ORAL | Status: DC
  Administered 2023-10-20 – 2023-10-26 (×11): 600 mg via ORAL
  Filled 2023-10-20 (×11): qty 1

## 2023-10-20 MED ORDER — LORATADINE 10 MG PO TABS
10.0000 mg | ORAL_TABLET | Freq: Every day | ORAL | Status: DC
  Administered 2023-10-21 – 2023-10-26 (×5): 10 mg via ORAL
  Filled 2023-10-20 (×5): qty 1

## 2023-10-20 MED ORDER — SENNOSIDES-DOCUSATE SODIUM 8.6-50 MG PO TABS
2.0000 | ORAL_TABLET | Freq: Every day | ORAL | Status: DC
  Administered 2023-10-20 – 2023-10-22 (×2): 2 via ORAL
  Filled 2023-10-20 (×3): qty 2

## 2023-10-20 MED ORDER — POLYVINYL ALCOHOL 1.4 % OP SOLN
1.0000 [drp] | OPHTHALMIC | Status: DC | PRN
  Administered 2023-10-22 – 2023-10-25 (×3): 1 [drp] via OPHTHALMIC
  Filled 2023-10-20: qty 15

## 2023-10-20 MED ORDER — LACTULOSE 10 GM/15ML PO SOLN: 30.0000 g | Freq: Every day | ORAL | Status: DC | PRN

## 2023-10-20 MED ORDER — MONTELUKAST SODIUM 10 MG PO TABS
10.0000 mg | ORAL_TABLET | Freq: Every day | ORAL | Status: DC
  Administered 2023-10-21 – 2023-10-26 (×5): 10 mg via ORAL
  Filled 2023-10-20 (×5): qty 1

## 2023-10-20 MED ORDER — DICLOFENAC SODIUM 1 % EX GEL
4.0000 g | Freq: Three times a day (TID) | CUTANEOUS | Status: DC
  Administered 2023-10-20 – 2023-10-26 (×16): 4 g via TOPICAL
  Filled 2023-10-20: qty 100

## 2023-10-20 MED ORDER — ACETAMINOPHEN 325 MG PO TABS
650.0000 mg | ORAL_TABLET | Freq: Four times a day (QID) | ORAL | Status: DC | PRN
  Administered 2023-10-20 – 2023-10-25 (×9): 650 mg via ORAL
  Filled 2023-10-20 (×9): qty 2

## 2023-10-20 MED ORDER — CARBOXYMETHYLCELLULOSE SODIUM 1 % OP GEL: 1.0000 [drp] | Freq: Every day | OPHTHALMIC | Status: DC

## 2023-10-20 MED ORDER — CHLORHEXIDINE GLUCONATE CLOTH 2 % EX PADS
6.0000 | MEDICATED_PAD | Freq: Every day | CUTANEOUS | Status: DC
  Administered 2023-10-21 – 2023-10-25 (×5): 6 via TOPICAL

## 2023-10-20 MED ORDER — PANTOPRAZOLE SODIUM 40 MG PO TBEC
40.0000 mg | DELAYED_RELEASE_TABLET | Freq: Two times a day (BID) | ORAL | Status: DC
  Administered 2023-10-20 – 2023-10-26 (×11): 40 mg via ORAL
  Filled 2023-10-20 (×11): qty 1

## 2023-10-20 MED ORDER — LINACLOTIDE 145 MCG PO CAPS
145.0000 ug | ORAL_CAPSULE | Freq: Every day | ORAL | Status: DC
  Administered 2023-10-20 – 2023-10-25 (×5): 145 ug via ORAL
  Filled 2023-10-20 (×7): qty 1

## 2023-10-20 MED ORDER — GUAIFENESIN ER 600 MG PO TB12: 600.0000 mg | ORAL_TABLET | Freq: Two times a day (BID) | ORAL | Status: DC

## 2023-10-20 MED ORDER — METHOCARBAMOL 500 MG PO TABS
1000.0000 mg | ORAL_TABLET | Freq: Three times a day (TID) | ORAL | Status: DC
  Administered 2023-10-20 – 2023-10-26 (×17): 1000 mg via ORAL
  Filled 2023-10-20 (×17): qty 2

## 2023-10-20 MED ORDER — ENOXAPARIN SODIUM 30 MG/0.3ML IJ SOSY
30.0000 mg | PREFILLED_SYRINGE | Freq: Every day | INTRAMUSCULAR | Status: DC
  Administered 2023-10-20: 30 mg via SUBCUTANEOUS
  Filled 2023-10-20: qty 0.3

## 2023-10-20 MED ORDER — ASPIRIN 81 MG PO TBEC
81.0000 mg | DELAYED_RELEASE_TABLET | Freq: Every day | ORAL | Status: DC
  Administered 2023-10-21 – 2023-10-26 (×5): 81 mg via ORAL
  Filled 2023-10-20 (×5): qty 1

## 2023-10-20 MED ORDER — HYDRALAZINE HCL 20 MG/ML IJ SOLN
10.0000 mg | Freq: Four times a day (QID) | INTRAMUSCULAR | Status: DC | PRN
  Administered 2023-10-20 – 2023-10-24 (×5): 10 mg via INTRAVENOUS
  Filled 2023-10-20 (×5): qty 1

## 2023-10-20 MED ORDER — INSULIN ASPART 100 UNIT/ML IJ SOLN
0.0000 [IU] | Freq: Three times a day (TID) | INTRAMUSCULAR | Status: DC
  Administered 2023-10-21: 1 [IU] via SUBCUTANEOUS
  Administered 2023-10-22 (×2): 2 [IU] via SUBCUTANEOUS
  Administered 2023-10-23 – 2023-10-24 (×3): 1 [IU] via SUBCUTANEOUS
  Administered 2023-10-24: 2 [IU] via SUBCUTANEOUS
  Administered 2023-10-25 – 2023-10-26 (×2): 3 [IU] via SUBCUTANEOUS
  Filled 2023-10-20 (×11): qty 1

## 2023-10-20 MED ORDER — EZETIMIBE 10 MG PO TABS
10.0000 mg | ORAL_TABLET | Freq: Every day | ORAL | Status: DC
  Administered 2023-10-20 – 2023-10-26 (×6): 10 mg via ORAL
  Filled 2023-10-20 (×6): qty 1

## 2023-10-20 MED ORDER — GABAPENTIN 100 MG PO CAPS
100.0000 mg | ORAL_CAPSULE | Freq: Two times a day (BID) | ORAL | Status: DC
  Administered 2023-10-20 – 2023-10-26 (×11): 100 mg via ORAL
  Filled 2023-10-20 (×11): qty 1

## 2023-10-20 MED ORDER — SODIUM CHLORIDE 0.9 % IV SOLN: 500.0000 mg | INTRAVENOUS | Status: DC

## 2023-10-20 MED ORDER — PANTOPRAZOLE SODIUM 40 MG PO TBEC: 40.0000 mg | DELAYED_RELEASE_TABLET | Freq: Every day | ORAL | Status: DC

## 2023-10-20 MED ORDER — SODIUM CHLORIDE 0.9 % IV SOLN
500.0000 mg | INTRAVENOUS | Status: DC
  Administered 2023-10-21 – 2023-10-24 (×4): 500 mg via INTRAVENOUS
  Filled 2023-10-20 (×2): qty 10
  Filled 2023-10-20: qty 500
  Filled 2023-10-20 (×4): qty 10

## 2023-10-20 MED ORDER — SODIUM CHLORIDE 0.9 % IV SOLN
500.0000 mg | INTRAVENOUS | Status: AC
  Administered 2023-10-20: 500 mg via INTRAVENOUS
  Filled 2023-10-20: qty 500

## 2023-10-20 MED ORDER — POLYETHYLENE GLYCOL 3350 17 G PO PACK
17.0000 g | PACK | Freq: Two times a day (BID) | ORAL | Status: DC
  Administered 2023-10-20 – 2023-10-26 (×9): 17 g via ORAL
  Filled 2023-10-20 (×10): qty 1

## 2023-10-20 NOTE — H&P (Incomplete Revision)
 History and Physical    Katrina Ortiz FMW:[id number] DOB: May 19, 1955 DOA: 10/20/2023  PCP: Relda Heron LABOR, MD (Confirm with patient/family/NH records and if not entered, this has to be entered at [facility] point of entry) Patient coming from: SNF  I have personally briefly reviewed patient's old medical records in [facility]  Chief Complaint: Feeling tired, hypotension  HPI: Katrina Ortiz is a 68 y.o. female with medical history significant of ESRD on HD MTWF, HTN, HLD, CAD status post CABG, chronic HFpEF, chronic iron deficiency anemia, obesity, GERD, presented with feeling of malaise, and hypotension during dialysis.  Patient was just hospitalized earlier this week for UTI and tibial fracture.  She was discharged home 2 days ago with p.o. antibiotics Keflex .  Patient however continued to experience malaise, cramping-like suprapubic abdominal pain but denied back pain or fever or chills.  Feel nauseous but no vomiting, no diarrhea.  Denies any chest pain shortness of breath.  Today, she went to dialysis as usual but was found blood pressure in the and improved to 80s without intervention.  ED Course: Afebrile, no tachycardia no hypotension blood pressure 111/63 O2 saturation 100% on room air.  Blood work showed K5.3 sodium 131 BUN 43, creatinine 3.6 glucose 180 hemoglobin 7.8 compared to baseline 7.8-8.3 WBC 7.4 lactic acid 3.3.  EKG showed chronic ST changes on V1-V3.  Patient was given meropenem in the ED.  Review of Systems: As per HPI otherwise 14 point review of systems   Past Medical History:  Diagnosis Date   Anemia    CHF (congestive heart failure) (HCC)    Chronic kidney disease    Coronary artery disease    Diabetes mellitus without complication (HCC)    DISH (diffuse idiopathic skeletal hyperostosis)    GERD (gastroesophageal reflux disease)    Hyperkalemia    Hypertension    Psoriasis    Psoriasis     Past Surgical History:  Procedure Laterality Date    CESAREAN SECTION     2 c-sections   CORONARY ARTERY BYPASS GRAFT     CORONARY STENT PLACEMENT     HERNIA REPAIR     TUBAL LIGATION       reports that she quit smoking about 20 years ago. Her smoking use included cigarettes. She has never used smokeless tobacco. She reports that she does not drink alcohol  and does not use drugs.  Allergies  Allergen Reactions   Pork-Derived Products Other (See Comments)    Family History  Problem Relation Age of Onset   Kidney cancer Mother    Heart attack Father     Prior to Admission medications   Medication Sig Start Date End Date Taking? Authorizing Provider  acetaminophen  (TYLENOL ) 325 MG tablet Take 2 tablets (650 mg total) by mouth every 6 (six) hours as needed for mild pain (pain score 1-3). 10/18/23   Wouk, Devaughn Sayres, MD  artificial tears ophthalmic solution 1 drop at bedtime.    [provider]  aspirin  EC 81 MG tablet Take 1 tablet by mouth daily. 07/24/22   [provider]  bumetanide  (BUMEX ) 1 MG tablet Take 4 mg by mouth 2 (two) times daily. 07/11/23   [provider]  Carboxymethylcellulose Sodium 1 % GEL Apply 1 drop to eye at bedtime.    [provider]  carvedilol  (COREG ) 6.25 MG tablet Take 6.25 mg by mouth 2 (two) times daily.    [provider]  cephALEXin  (KEFLEX ) 250 MG capsule Take 1 capsule (250 mg  total) by mouth daily in the afternoon for 4 days. Starting 8/28 10/18/23 10/22/23  Kandis Devaughn Sayres, MD  cetirizine (ZYRTEC) 10 MG tablet Take 5 mg by mouth daily.    [provider]  cholecalciferol  (VITAMIN D3) 25 MCG (1000 UNIT) tablet Take 1,000 Units by mouth daily.    [provider]  Cranberry 450 MG TABS Take 1 tablet by mouth daily.    [provider]  diclofenac  Sodium (VOLTAREN ) 1 % GEL Apply 4 g topically 3 (three) times daily.    [provider]  ezetimibe  (ZETIA ) 10 MG tablet Take 10 mg by mouth daily.    [provider]   ferrous sulfate  325 (65 FE) MG EC tablet Take 325 mg by mouth every other day.    [provider]  fluticasone  (FLONASE ) 50 MCG/ACT nasal spray Place 1 spray into both nostrils 2 (two) times daily.    [provider]  gabapentin  (NEURONTIN ) 100 MG capsule Take 100 mg by mouth 2 (two) times daily.    [provider]  guaiFENesin  (MUCINEX ) 600 MG 12 hr tablet Take 600 mg by mouth 2 (two) times daily.    [provider]  insulin  aspart (NOVOLOG ) 100 UNIT/ML injection Inject into the skin. Give before meals and at bedtime per sliding scale. BS < 80 call MD, BS 201 to 250 give 2 units, BS 251 to 300 give 4 units, BS 301 to 350 give 6 units, BS 351 to 400 give 8 units, BS 401 to 450 give 10 units, BS 451 to 500 give 12 units, BS > 500 give 14 units and call MD    [provider]  lactulose  (CHRONULAC ) 10 GM/15ML solution Take 30 g by mouth daily as needed. 10/12/23   [provider]  LANTUS  SOLOSTAR 100 UNIT/ML Solostar Pen Inject 10 Units into the skin at bedtime. Hold if sugar < 100 Patient taking differently: Inject 15 Units into the skin at bedtime. Hold if sugar < 100 04/11/21   Maree Hue, MD  latanoprost  (XALATAN ) 0.005 % ophthalmic solution Place 1 drop into both eyes at bedtime.    [provider]  linaclotide  (LINZESS ) 145 MCG CAPS capsule Take 145 mcg by mouth daily.    [provider]  losartan  (COZAAR ) 50 MG tablet Take 1 tablet (50 mg total) by mouth daily. 09/02/23 10/17/23  Trudy Anthony HERO, MD  methocarbamol  (ROBAXIN ) 500 MG tablet Take 1,000 mg by mouth 3 (three) times daily. 10/12/23   [provider]  montelukast  (SINGULAIR ) 10 MG tablet Take 10 mg by mouth daily.    [provider]  Multiple Vitamins-Minerals (DECUBI-VITE) CAPS Take 1 capsule by mouth every morning.    [provider]  nitroGLYCERIN  (NITROSTAT ) 0.4 MG SL tablet Place 0.4 mg under the tongue every 5 (five) minutes as needed  for chest pain.    [provider]  nystatin powder Apply 1 Application topically 2 (two) times daily.    [provider]  omeprazole (PRILOSEC) 40 MG capsule Take 40 mg by mouth 2 (two) times daily.    [provider]  polyethylene glycol (MIRALAX  / GLYCOLAX ) 17 g packet Take 17 g by mouth 2 (two) times daily.    [provider]  senna-docusate (SENOKOT-S) 8.6-50 MG tablet Take 2 tablets by mouth at bedtime. 03/28/23   [provider]  simethicone  (MYLICON) 80 MG chewable tablet Chew 80 mg by mouth every 6 (six) hours as needed for flatulence.  [provider]  zinc  oxide 20 % ointment Apply 1 Application topically as needed for irritation.    [provider]    Physical Exam: Vitals:   10/20/23 1140 10/20/23 1143  BP: (!) 113/58   Pulse: 64   Resp: 18   Temp: (!) 97.5 F (36.4 C)   TempSrc: Oral   SpO2: 99%   Weight:  83.9 kg  Height:  5' 3 (1.6 m)    Constitutional: NAD, calm, comfortable Vitals:   10/20/23 1140 10/20/23 1143  BP: (!) 113/58   Pulse: 64   Resp: 18   Temp: (!) 97.5 F (36.4 C)   TempSrc: Oral   SpO2: 99%   Weight:  83.9 kg  Height:  5' 3 (1.6 m)   Eyes: PERRL, lids and conjunctivae normal ENMT: Mucous membranes are moist. Posterior pharynx clear of any exudate or lesions.Normal dentition.  Neck: normal, supple, no masses, no thyromegaly Respiratory: clear to auscultation bilaterally, no wheezing, no crackles. Normal respiratory effort. No accessory muscle use.  Cardiovascular: Regular rate and rhythm, no murmurs / rubs / gallops. No extremity edema. 2+ pedal pulses. No carotid bruits.  Abdomen: no tenderness, no masses palpated. No hepatosplenomegaly. Bowel sounds positive.  Musculoskeletal: no clubbing / cyanosis. No joint deformity upper and lower extremities. Good ROM, no contractures. Normal muscle tone.  Skin: no rashes, lesions, ulcers. No induration Neurologic: CN 2-12 grossly  intact. Sensation intact, DTR normal. Strength 5/5 in all 4.  Psychiatric: Normal judgment and insight. Alert and oriented x 3. Normal mood.     Labs on Admission: I have personally reviewed following labs and imaging studies  CBC: Recent Labs  Lab 10/17/23 1430 10/18/23 0538 10/20/23 1248  WBC 9.5 6.7 7.4  HGB 9.3* 8.3* 7.8*  HCT 29.5* 26.0* 25.4*  MCV 94.6 94.5 96.2  PLT 196 183 174   Basic Metabolic Panel: Recent Labs  Lab 10/17/23 1430 10/18/23 0538 10/20/23 1248  NA 131* 135 131*  K 4.4 4.1 5.2*  CL 93* 95* 93*  CO2 22 28 25   GLUCOSE 247* 158* 180*  BUN 35* 45* 54*  CREATININE 2.20* 2.72* 3.65*  CALCIUM  8.9 8.8* 8.5*   GFR: Estimated Creatinine Clearance: 15.1 mL/min (A) (by C-G formula based on SCr of 3.65 mg/dL (H)). Liver Function Tests: Recent Labs  Lab 10/17/23 1430 10/20/23 1248  AST 32 20  ALT 14 11  ALKPHOS 202* 171*  BILITOT 1.1 1.5*  PROT 7.2 6.3*  ALBUMIN  3.1* 2.7*   Recent Labs  Lab 10/17/23 1430 10/20/23 1248  LIPASE 20 20   No results for input(s): AMMONIA in the last 168 hours. Coagulation Profile: No results for input(s): INR, PROTIME in the last 168 hours. Cardiac Enzymes: No results for input(s): CKTOTAL, CKMB, CKMBINDEX, TROPONINI in the last 168 hours. BNP (last 3 results) No results for input(s): PROBNP in the last 8760 hours. HbA1C: No results for input(s): HGBA1C in the last 72 hours. CBG: Recent Labs  Lab 10/17/23 2208 10/18/23 0742 10/18/23 1153  GLUCAP 163* 133* 135*   Lipid Profile: No results for input(s): CHOL, HDL, LDLCALC, TRIG, CHOLHDL, LDLDIRECT in the last 72 hours. Thyroid Function Tests: No results for input(s): TSH, T4TOTAL, FREET4, T3FREE, THYROIDAB in the last 72 hours. Anemia Panel: No results for input(s): VITAMINB12, FOLATE, FERRITIN, TIBC, IRON, RETICCTPCT in the last 72 hours. Urine analysis:    Component Value Date/Time   COLORURINE  YELLOW (A) 10/17/2023 2006   APPEARANCEUR TURBID (A) 10/17/2023 2006  LABSPEC 1.007 10/17/2023 2006   PHURINE 7.0 10/17/2023 2006   GLUCOSEU 150 (A) 10/17/2023 2006   HGBUR SMALL (A) 10/17/2023 2006   BILIRUBINUR NEGATIVE 10/17/2023 2006   KETONESUR NEGATIVE 10/17/2023 2006   PROTEINUR >=300 (A) 10/17/2023 2006   NITRITE NEGATIVE 10/17/2023 2006   LEUKOCYTESUR MODERATE (A) 10/17/2023 2006    Radiological Exams on Admission: DG Chest Port 1 View Result Date: 10/20/2023 CLINICAL DATA:  Hypotensive during dialysis. EXAM: PORTABLE CHEST 1 VIEW COMPARISON:  08/28/2023. FINDINGS: Stable cardiomediastinal contours. Stable dialysis catheter with tip at the superior cavoatrial junction. Pulmonary vascular congestion. Similar blunting of the left costophrenic angle could reflect atelectasis/scarring and/or small left pleural effusion. No focal consolidation. No pneumothorax. Diffuse osseous demineralization. No acute osseous abnormality. IMPRESSION: 1. Cardiomegaly with pulmonary vascular congestion. 2. Similar blunting of the left costophrenic angle could reflect atelectasis/scarring and/or small left pleural effusion. Electronically Signed   By: Harrietta Sherry M.D.   On: 10/20/2023 13:19    EKG: Independently reviewed.  Sinus rhythm, chronic ST changes on V1-V3  Assessment/Plan Principal Problem:   UTI due to extended-spectrum beta lactamase (ESBL) producing Escherichia coli  (please populate well all problems here in Problem List. (For example, if patient is on BP meds at home and you resume or decide to hold them, it is a problem that needs to be her. Same for CAD, COPD, HLD and so on)  ESBL UTI - Failed outpatient p.o. antibiotic treatment - As per pharmacy recommendation, will continue meropenem  Sepsis, without acute endorgan damage -Sepsis as evidenced by hypotension, elevated lactic acid, source of infection is ESBL UTI.  Management as above. - Patient's blood pressure normalized  without intervention, and clinically appears to be euvolemic, given her history of ESBL, we will hold off further IV fluid. - Antibiotics as above - Hold off home BP/CHF medications  Right-sided tibial plateau fracture - Pain medication Procosa Tylenol  - As needed Robaxin  - Continue PT  ESRD on HD Mild hyperkalemia Chronic uremia and chronic azotemia - Nephrology consulted for routine dialysis  Elevated troponins CAD Chronic EKG ST changes -No chest pain, EKG showed chronic ST changes on V1-V3, trop elevation probably from demanding ischemia s/s sepsis, will order Echo to evaluate wall mobility - Continue aspirin   HTN Chronic HFpEF -Xray showed signs of congestion, clinically euvolemic to mild congestion, nephro consulted for HD - Hold off Coreg  and Cozaar  hold off torsemide -Hold off torsemide- -as needed hydralazine   DVT prophylaxis: Heparin  subcu Code Status: DNR/DNI Family Communication: Son over the phone Disposition Plan: Patient is sick with ESBL UTI requiring IV antibiotics, expect wound in a hospital setting Consults called: Nephrology Admission status: Telemetry admission   Cort ONEIDA Mana MD [facility] Pager [phone] 10/20/2023, 1:53 PM

## 2023-10-20 NOTE — ED Notes (Signed)
 Called CCMD and added pt to monitoring board

## 2023-10-20 NOTE — ED Notes (Addendum)
 Dicky, MD, made aware of troponin 334. Repeat EKG ordered.

## 2023-10-20 NOTE — ED Notes (Signed)
 IV team at bedside

## 2023-10-20 NOTE — H&P (Addendum)
 History and Physical    Katrina Ortiz FMW:[id number] DOB: May 19, 1955 DOA: 10/20/2023  PCP: Relda Heron LABOR, MD (Confirm with patient/family/NH records and if not entered, this has to be entered at [facility] point of entry) Patient coming from: SNF  I have personally briefly reviewed patient's old medical records in [facility]  Chief Complaint: Feeling tired, hypotension  HPI: Katrina Ortiz is a 68 y.o. female with medical history significant of ESRD on HD MTWF, HTN, HLD, CAD status post CABG, chronic HFpEF, chronic iron deficiency anemia, obesity, GERD, presented with feeling of malaise, and hypotension during dialysis.  Patient was just hospitalized earlier this week for UTI and tibial fracture.  She was discharged home 2 days ago with p.o. antibiotics Keflex .  Patient however continued to experience malaise, cramping-like suprapubic abdominal pain but denied back pain or fever or chills.  Feel nauseous but no vomiting, no diarrhea.  Denies any chest pain shortness of breath.  Today, she went to dialysis as usual but was found blood pressure in the and improved to 80s without intervention.  ED Course: Afebrile, no tachycardia no hypotension blood pressure 111/63 O2 saturation 100% on room air.  Blood work showed K5.3 sodium 131 BUN 43, creatinine 3.6 glucose 180 hemoglobin 7.8 compared to baseline 7.8-8.3 WBC 7.4 lactic acid 3.3.  EKG showed chronic ST changes on V1-V3.  Patient was given meropenem in the ED.  Review of Systems: As per HPI otherwise 14 point review of systems   Past Medical History:  Diagnosis Date   Anemia    CHF (congestive heart failure) (HCC)    Chronic kidney disease    Coronary artery disease    Diabetes mellitus without complication (HCC)    DISH (diffuse idiopathic skeletal hyperostosis)    GERD (gastroesophageal reflux disease)    Hyperkalemia    Hypertension    Psoriasis    Psoriasis     Past Surgical History:  Procedure Laterality Date    CESAREAN SECTION     2 c-sections   CORONARY ARTERY BYPASS GRAFT     CORONARY STENT PLACEMENT     HERNIA REPAIR     TUBAL LIGATION       reports that she quit smoking about 20 years ago. Her smoking use included cigarettes. She has never used smokeless tobacco. She reports that she does not drink alcohol  and does not use drugs.  Allergies  Allergen Reactions   Pork-Derived Products Other (See Comments)    Family History  Problem Relation Age of Onset   Kidney cancer Mother    Heart attack Father     Prior to Admission medications   Medication Sig Start Date End Date Taking? Authorizing Provider  acetaminophen  (TYLENOL ) 325 MG tablet Take 2 tablets (650 mg total) by mouth every 6 (six) hours as needed for mild pain (pain score 1-3). 10/18/23   Wouk, Devaughn Sayres, MD  artificial tears ophthalmic solution 1 drop at bedtime.    [provider]  aspirin  EC 81 MG tablet Take 1 tablet by mouth daily. 07/24/22   [provider]  bumetanide  (BUMEX ) 1 MG tablet Take 4 mg by mouth 2 (two) times daily. 07/11/23   [provider]  Carboxymethylcellulose Sodium 1 % GEL Apply 1 drop to eye at bedtime.    [provider]  carvedilol  (COREG ) 6.25 MG tablet Take 6.25 mg by mouth 2 (two) times daily.    [provider]  cephALEXin  (KEFLEX ) 250 MG capsule Take 1 capsule (250 mg  total) by mouth daily in the afternoon for 4 days. Starting 8/28 10/18/23 10/22/23  Kandis Devaughn Sayres, MD  cetirizine (ZYRTEC) 10 MG tablet Take 5 mg by mouth daily.    [provider]  cholecalciferol  (VITAMIN D3) 25 MCG (1000 UNIT) tablet Take 1,000 Units by mouth daily.    [provider]  Cranberry 450 MG TABS Take 1 tablet by mouth daily.    [provider]  diclofenac  Sodium (VOLTAREN ) 1 % GEL Apply 4 g topically 3 (three) times daily.    [provider]  ezetimibe  (ZETIA ) 10 MG tablet Take 10 mg by mouth daily.    [provider]   ferrous sulfate  325 (65 FE) MG EC tablet Take 325 mg by mouth every other day.    [provider]  fluticasone  (FLONASE ) 50 MCG/ACT nasal spray Place 1 spray into both nostrils 2 (two) times daily.    [provider]  gabapentin  (NEURONTIN ) 100 MG capsule Take 100 mg by mouth 2 (two) times daily.    [provider]  guaiFENesin  (MUCINEX ) 600 MG 12 hr tablet Take 600 mg by mouth 2 (two) times daily.    [provider]  insulin  aspart (NOVOLOG ) 100 UNIT/ML injection Inject into the skin. Give before meals and at bedtime per sliding scale. BS < 80 call MD, BS 201 to 250 give 2 units, BS 251 to 300 give 4 units, BS 301 to 350 give 6 units, BS 351 to 400 give 8 units, BS 401 to 450 give 10 units, BS 451 to 500 give 12 units, BS > 500 give 14 units and call MD    [provider]  lactulose  (CHRONULAC ) 10 GM/15ML solution Take 30 g by mouth daily as needed. 10/12/23   [provider]  LANTUS  SOLOSTAR 100 UNIT/ML Solostar Pen Inject 10 Units into the skin at bedtime. Hold if sugar < 100 Patient taking differently: Inject 15 Units into the skin at bedtime. Hold if sugar < 100 04/11/21   Maree Hue, MD  latanoprost  (XALATAN ) 0.005 % ophthalmic solution Place 1 drop into both eyes at bedtime.    [provider]  linaclotide  (LINZESS ) 145 MCG CAPS capsule Take 145 mcg by mouth daily.    [provider]  losartan  (COZAAR ) 50 MG tablet Take 1 tablet (50 mg total) by mouth daily. 09/02/23 10/17/23  Trudy Anthony HERO, MD  methocarbamol  (ROBAXIN ) 500 MG tablet Take 1,000 mg by mouth 3 (three) times daily. 10/12/23   [provider]  montelukast  (SINGULAIR ) 10 MG tablet Take 10 mg by mouth daily.    [provider]  Multiple Vitamins-Minerals (DECUBI-VITE) CAPS Take 1 capsule by mouth every morning.    [provider]  nitroGLYCERIN  (NITROSTAT ) 0.4 MG SL tablet Place 0.4 mg under the tongue every 5 (five) minutes as needed  for chest pain.    [provider]  nystatin powder Apply 1 Application topically 2 (two) times daily.    [provider]  omeprazole (PRILOSEC) 40 MG capsule Take 40 mg by mouth 2 (two) times daily.    [provider]  polyethylene glycol (MIRALAX  / GLYCOLAX ) 17 g packet Take 17 g by mouth 2 (two) times daily.    [provider]  senna-docusate (SENOKOT-S) 8.6-50 MG tablet Take 2 tablets by mouth at bedtime. 03/28/23   [provider]  simethicone  (MYLICON) 80 MG chewable tablet Chew 80 mg by mouth every 6 (six) hours as needed for flatulence.  [provider]  zinc  oxide 20 % ointment Apply 1 Application topically as needed for irritation.    [provider]    Physical Exam: Vitals:   10/20/23 1140 10/20/23 1143  BP: (!) 113/58   Pulse: 64   Resp: 18   Temp: (!) 97.5 F (36.4 C)   TempSrc: Oral   SpO2: 99%   Weight:  83.9 kg  Height:  5' 3 (1.6 m)    Constitutional: NAD, calm, comfortable Vitals:   10/20/23 1140 10/20/23 1143  BP: (!) 113/58   Pulse: 64   Resp: 18   Temp: (!) 97.5 F (36.4 C)   TempSrc: Oral   SpO2: 99%   Weight:  83.9 kg  Height:  5' 3 (1.6 m)   Eyes: PERRL, lids and conjunctivae normal ENMT: Mucous membranes are moist. Posterior pharynx clear of any exudate or lesions.Normal dentition.  Neck: normal, supple, no masses, no thyromegaly Respiratory: clear to auscultation bilaterally, no wheezing, no crackles. Normal respiratory effort. No accessory muscle use.  Cardiovascular: Regular rate and rhythm, no murmurs / rubs / gallops. No extremity edema. 2+ pedal pulses. No carotid bruits.  Abdomen: no tenderness, no masses palpated. No hepatosplenomegaly. Bowel sounds positive.  Musculoskeletal: no clubbing / cyanosis. No joint deformity upper and lower extremities. Good ROM, no contractures. Normal muscle tone.  Skin: no rashes, lesions, ulcers. No induration Neurologic: CN 2-12 grossly  intact. Sensation intact, DTR normal. Strength 5/5 in all 4.  Psychiatric: Normal judgment and insight. Alert and oriented x 3. Normal mood.     Labs on Admission: I have personally reviewed following labs and imaging studies  CBC: Recent Labs  Lab 10/17/23 1430 10/18/23 0538 10/20/23 1248  WBC 9.5 6.7 7.4  HGB 9.3* 8.3* 7.8*  HCT 29.5* 26.0* 25.4*  MCV 94.6 94.5 96.2  PLT 196 183 174   Basic Metabolic Panel: Recent Labs  Lab 10/17/23 1430 10/18/23 0538 10/20/23 1248  NA 131* 135 131*  K 4.4 4.1 5.2*  CL 93* 95* 93*  CO2 22 28 25   GLUCOSE 247* 158* 180*  BUN 35* 45* 54*  CREATININE 2.20* 2.72* 3.65*  CALCIUM  8.9 8.8* 8.5*   GFR: Estimated Creatinine Clearance: 15.1 mL/min (A) (by C-G formula based on SCr of 3.65 mg/dL (H)). Liver Function Tests: Recent Labs  Lab 10/17/23 1430 10/20/23 1248  AST 32 20  ALT 14 11  ALKPHOS 202* 171*  BILITOT 1.1 1.5*  PROT 7.2 6.3*  ALBUMIN  3.1* 2.7*   Recent Labs  Lab 10/17/23 1430 10/20/23 1248  LIPASE 20 20   No results for input(s): AMMONIA in the last 168 hours. Coagulation Profile: No results for input(s): INR, PROTIME in the last 168 hours. Cardiac Enzymes: No results for input(s): CKTOTAL, CKMB, CKMBINDEX, TROPONINI in the last 168 hours. BNP (last 3 results) No results for input(s): PROBNP in the last 8760 hours. HbA1C: No results for input(s): HGBA1C in the last 72 hours. CBG: Recent Labs  Lab 10/17/23 2208 10/18/23 0742 10/18/23 1153  GLUCAP 163* 133* 135*   Lipid Profile: No results for input(s): CHOL, HDL, LDLCALC, TRIG, CHOLHDL, LDLDIRECT in the last 72 hours. Thyroid Function Tests: No results for input(s): TSH, T4TOTAL, FREET4, T3FREE, THYROIDAB in the last 72 hours. Anemia Panel: No results for input(s): VITAMINB12, FOLATE, FERRITIN, TIBC, IRON, RETICCTPCT in the last 72 hours. Urine analysis:    Component Value Date/Time   COLORURINE  YELLOW (A) 10/17/2023 2006   APPEARANCEUR TURBID (A) 10/17/2023 2006  LABSPEC 1.007 10/17/2023 2006   PHURINE 7.0 10/17/2023 2006   GLUCOSEU 150 (A) 10/17/2023 2006   HGBUR SMALL (A) 10/17/2023 2006   BILIRUBINUR NEGATIVE 10/17/2023 2006   KETONESUR NEGATIVE 10/17/2023 2006   PROTEINUR >=300 (A) 10/17/2023 2006   NITRITE NEGATIVE 10/17/2023 2006   LEUKOCYTESUR MODERATE (A) 10/17/2023 2006    Radiological Exams on Admission: DG Chest Port 1 View Result Date: 10/20/2023 CLINICAL DATA:  Hypotensive during dialysis. EXAM: PORTABLE CHEST 1 VIEW COMPARISON:  08/28/2023. FINDINGS: Stable cardiomediastinal contours. Stable dialysis catheter with tip at the superior cavoatrial junction. Pulmonary vascular congestion. Similar blunting of the left costophrenic angle could reflect atelectasis/scarring and/or small left pleural effusion. No focal consolidation. No pneumothorax. Diffuse osseous demineralization. No acute osseous abnormality. IMPRESSION: 1. Cardiomegaly with pulmonary vascular congestion. 2. Similar blunting of the left costophrenic angle could reflect atelectasis/scarring and/or small left pleural effusion. Electronically Signed   By: Harrietta Sherry M.D.   On: 10/20/2023 13:19    EKG: Independently reviewed.  Sinus rhythm, chronic ST changes on V1-V3  Assessment/Plan Principal Problem:   UTI due to extended-spectrum beta lactamase (ESBL) producing Escherichia coli  (please populate well all problems here in Problem List. (For example, if patient is on BP meds at home and you resume or decide to hold them, it is a problem that needs to be her. Same for CAD, COPD, HLD and so on)  ESBL UTI - Failed outpatient p.o. antibiotic treatment - As per pharmacy recommendation, will continue meropenem  Sepsis, without acute endorgan damage -Sepsis as evidenced by hypotension, elevated lactic acid, source of infection is ESBL UTI.  Management as above. - Patient's blood pressure normalized  without intervention, and clinically appears to be euvolemic, given her history of ESBL, we will hold off further IV fluid. - Antibiotics as above - Hold off home BP/CHF medications  Right-sided tibial plateau fracture - Pain medication Procosa Tylenol  - As needed Robaxin  - Continue PT  ESRD on HD Mild hyperkalemia Chronic uremia and chronic azotemia - Nephrology consulted for routine dialysis  Elevated troponins CAD Chronic EKG ST changes -No chest pain, EKG showed chronic ST changes on V1-V3, trop elevation probably from demanding ischemia s/s sepsis, will order Echo to evaluate wall mobility - Continue aspirin   HTN Chronic HFpEF -Xray showed signs of congestion, clinically euvolemic to mild congestion, nephro consulted for HD - Hold off Coreg  and Cozaar  hold off torsemide -Hold off torsemide- -as needed hydralazine   DVT prophylaxis: Heparin  subcu Code Status: DNR/DNI Family Communication: Son over the phone Disposition Plan: Patient is sick with ESBL UTI requiring IV antibiotics, expect wound in a hospital setting Consults called: Nephrology Admission status: Telemetry admission   Cort ONEIDA Mana MD [facility] Pager [phone] 10/20/2023, 1:53 PM

## 2023-10-20 NOTE — ED Triage Notes (Signed)
 Patient from [facility] where she was receiving dialysis. Per EMS while the patient was receiving dialysis she became hypotensive 60's systolic and with fire department 80's systolic. The patient is alert and oriented at triage. MD Quale at the bedside.

## 2023-10-20 NOTE — ED Provider Notes (Addendum)
 [facility] Provider Note    Event Date/Time   First MD Initiated Contact with Patient 10/20/23 1159     (approximate)   History   Hypotension   HPI  Katrina Ortiz is a 68 y.o. female end-stage renal disease recent fracture and treatment for UTI.  Patient has continued to feel nauseated fatigued and unwell.  Went to dialysis today and felt very nauseated weak and her blood pressure was noted to be in the 60s.  After discontinuing dialysis blood pressure in the 80s with fire department and then eventually improving with EMS.  Patient reports she feels nauseated and a bit constipated for several days.  She does not have any abdominal pain does not wish for any pain medication  No chest pain or trouble breathing.  Use home oxygen.  On dialysis but still urinates some.  She feels like she still has a urinary tract infection  No weakness no headache.     Physical Exam   Triage Vital Signs: ED Triage Vitals  Encounter Vitals Group     BP 10/20/23 1140 (!) 113/58     Girls Systolic BP Percentile --      Girls Diastolic BP Percentile --      Boys Systolic BP Percentile --      Boys Diastolic BP Percentile --      Pulse Rate 10/20/23 1140 64     Resp 10/20/23 1140 18     Temp 10/20/23 1140 (!) 97.5 F (36.4 C)     Temp Source 10/20/23 1140 Oral     SpO2 10/20/23 1140 99 %     Weight 10/20/23 1143 185 lb (83.9 kg)     Height 10/20/23 1143 5' 3 (1.6 m)     Head Circumference --      Peak Flow --      Pain Score 10/20/23 1142 7     Pain Loc --      Pain Education --      Exclude from Growth Chart --     Most recent vital signs: Vitals:   10/20/23 1300 10/20/23 1400  BP:    Pulse: 62 63  Resp: 20 15  Temp:    SpO2: 100% 100%     General: Awake, no distress.  Appears fatigued but in no acute distress. CV:  Good peripheral perfusion.  Normal tones and rate Resp:  Normal effort.  Clear bilateral normal oxygen saturation on 2 L Abd:  No  distention.  Soft nontender nondistended does feel nauseated but no focal tenderness to exam Other:  Splint on right knee right lower leg   ED Results / Procedures / Treatments   Labs (all labs ordered are listed, but only abnormal results are displayed) Labs Reviewed  COMPREHENSIVE METABOLIC PANEL WITH GFR - Abnormal; Notable for the following components:      Result Value   Sodium 131 (*)    Potassium 5.2 (*)    Chloride 93 (*)    Glucose, Bld 180 (*)    BUN 54 (*)    Creatinine, Ser 3.65 (*)    Calcium  8.5 (*)    Total Protein 6.3 (*)    Albumin  2.7 (*)    Alkaline Phosphatase 171 (*)    Total Bilirubin 1.5 (*)    GFR, Estimated 13 (*)    All other components within normal limits  CBC - Abnormal; Notable for the following components:   RBC 2.64 (*)    Hemoglobin 7.8 (*)  HCT 25.4 (*)    All other components within normal limits  LACTIC ACID, PLASMA - Abnormal; Notable for the following components:   Lactic Acid, Venous 3.3 (*)    All other components within normal limits  TROPONIN I (HIGH SENSITIVITY) - Abnormal; Notable for the following components:   Troponin I (High Sensitivity) 334 (*)    All other components within normal limits  CULTURE, BLOOD (ROUTINE X 2)  CULTURE, BLOOD (ROUTINE X 2)  LIPASE, BLOOD  URINALYSIS, ROUTINE W REFLEX MICROSCOPIC  LACTIC ACID, PLASMA     EKG  And inter by me at 1150 heart rate 65 QRS 90 QTc 460 Normal sinus rhythm, significant T wave abnormality anterior leads, consider Wellens type cause  Troponin added on, notify Dr. Laurita of ideation.  At this time though she does not have any associated chest pain that would necessarily point to an obvious cause of ACS.  RADIOLOGY    DG Chest Port 1 View Result Date: 10/20/2023 CLINICAL DATA:  Hypotensive during dialysis. EXAM: PORTABLE CHEST 1 VIEW COMPARISON:  08/28/2023. FINDINGS: Stable cardiomediastinal contours. Stable dialysis catheter with tip at the superior cavoatrial  junction. Pulmonary vascular congestion. Similar blunting of the left costophrenic angle could reflect atelectasis/scarring and/or small left pleural effusion. No focal consolidation. No pneumothorax. Diffuse osseous demineralization. No acute osseous abnormality. IMPRESSION: 1. Cardiomegaly with pulmonary vascular congestion. 2. Similar blunting of the left costophrenic angle could reflect atelectasis/scarring and/or small left pleural effusion. Electronically Signed   By: Harrietta Sherry M.D.   On: 10/20/2023 13:19      PROCEDURES:  Critical Care performed: No  Procedures   MEDICATIONS ORDERED IN ED: Medications  ondansetron  (ZOFRAN ) injection 4 mg (has no administration in time range)  Chlorhexidine  Gluconate Cloth 2 % PADS 6 each (has no administration in time range)  meropenem (MERREM) 500 mg in sodium chloride  0.9 % 100 mL IVPB (has no administration in time range)  insulin  aspart (novoLOG ) injection 0-6 Units (has no administration in time range)  hydrALAZINE  (APRESOLINE ) injection 10 mg (has no administration in time range)  ertapenem  (INVANZ ) 500 mg in sodium chloride  0.9 % 50 mL IVPB (500 mg Intravenous New Bag/Given 10/20/23 1401)     IMPRESSION / MDM / ASSESSMENT AND PLAN / ED COURSE  I reviewed the triage vital signs and the nursing notes.                              Differential diagnosis includes, but is not limited to, dehydration, anemia, myocardial ischemia, etc. electrolyte abnormalities, hypotension due to volume shifts, sepsis or ongoing infection especially in light of her ongoing symptoms of nausea and known urinary tract infection.  No hypoxia patient reports use of home oxygen no shortness of breath.  Chest x-ray appears to show volume overload, nephrology has been consulted but at this time she does not have any clear evidence to point towards severe volume overload that would require emergent dialysis.  She did undergo approximately 1 hour of hemodialysis  today  Anemia is noted but similar to previous appears to be fairly chronic  Patient's presentation is most consistent with acute complicated illness / injury requiring diagnostic workup.   The patient is on the cardiac monitor to evaluate for evidence of arrhythmia and/or significant heart rate changes.   Clinical Course as of 10/20/23 1431  Fri Oct 20, 2023  1252 Patient noted to be on cephalexin .  Her urine culture  shows resistance profile against cephalosporins.  Discussed with Dr. Dennise of nephrology, he recommends ertapenem .  Initial dose 500. [MQ]  1331 Prior urine culture data concerning for ESBL [MQ]  1429 Repeat ECG inter by me at 1430 heart rate 65 QRS 80 QTc 420 Persistent T wave abnormality including inversions in V1 and V2.  No associated chest pain.  Initial troponin is elevated though, concerning the patient may have had an episode of ACS associated with the hypotension or significant myocardial demand etc.  No evidence STEMI. [MQ]    Clinical Course User Index [MQ] Dicky Anes, MD   Accepted to hospitalist service by Dr. Laurita.  Notified Dr. Laurita that I have added a troponin to her lab testing due to abnormal EKG at this point but the patient is currently chest pain-free.  ----------------------------------------- 2:32 PM on 10/20/2023 ----------------------------------------- Troponin elevated at level of about 300.  Hospitalist made aware for further treatment and care considerations.  Consider potential demand ischemia or NSTEMI.  No evidence STEMI at this time and no associated chest pain  FINAL CLINICAL IMPRESSION(S) / ED DIAGNOSES   Final diagnoses:  ESBL (extended spectrum beta-lactamase) producing bacteria infection  Intra-dialytic hypotension  Abnormal EKG  Elevated troponin     Rx / DC Orders   ED Discharge Orders     None        Note:  This document was prepared using Dragon voice recognition software and may include unintentional dictation  errors.   Dicky Anes, MD 10/20/23 1347    Dicky Anes, MD 10/20/23 [phone]

## 2023-10-21 ENCOUNTER — Other Ambulatory Visit: Payer: Self-pay

## 2023-10-21 ENCOUNTER — Encounter: Payer: Self-pay | Admitting: Internal Medicine

## 2023-10-21 ENCOUNTER — Inpatient Hospital Stay
Admit: 2023-10-21 | Discharge: 2023-10-21 | Disposition: A | Discharge status: Home / Self Care | Attending: Internal Medicine | Admitting: Internal Medicine

## 2023-10-21 DIAGNOSIS — E875 Hyperkalemia: Secondary | ICD-10-CM

## 2023-10-21 DIAGNOSIS — E1122 Type 2 diabetes mellitus with diabetic chronic kidney disease: Secondary | ICD-10-CM

## 2023-10-21 DIAGNOSIS — S82141S Displaced bicondylar fracture of right tibia, sequela: Secondary | ICD-10-CM | POA: Diagnosis not present

## 2023-10-21 DIAGNOSIS — I9589 Other hypotension: Secondary | ICD-10-CM

## 2023-10-21 DIAGNOSIS — I5032 Chronic diastolic (congestive) heart failure: Secondary | ICD-10-CM | POA: Diagnosis not present

## 2023-10-21 DIAGNOSIS — N186 End stage renal disease: Secondary | ICD-10-CM

## 2023-10-21 DIAGNOSIS — R7989 Other specified abnormal findings of blood chemistry: Secondary | ICD-10-CM | POA: Diagnosis not present

## 2023-10-21 DIAGNOSIS — I5A Non-ischemic myocardial injury (non-traumatic): Secondary | ICD-10-CM | POA: Insufficient documentation

## 2023-10-21 DIAGNOSIS — E871 Hypo-osmolality and hyponatremia: Secondary | ICD-10-CM | POA: Insufficient documentation

## 2023-10-21 DIAGNOSIS — Z992 Dependence on renal dialysis: Secondary | ICD-10-CM

## 2023-10-21 DIAGNOSIS — N390 Urinary tract infection, site not specified: Secondary | ICD-10-CM | POA: Diagnosis not present

## 2023-10-21 DIAGNOSIS — I959 Hypotension, unspecified: Secondary | ICD-10-CM

## 2023-10-21 LAB — BASIC METABOLIC PANEL WITH GFR
Anion gap: 13 (ref 5–15)
BUN: 61 mg/dL — ABNORMAL HIGH (ref 8–23)
CO2: 25 mmol/L (ref 22–32)
Calcium: 8.4 mg/dL — ABNORMAL LOW (ref 8.9–10.3)
Chloride: 95 mmol/L — ABNORMAL LOW (ref 98–111)
Creatinine, Ser: 4.37 mg/dL — ABNORMAL HIGH (ref 0.44–1.00)
GFR, Estimated: 10 mL/min — ABNORMAL LOW (ref >60)
Glucose, Bld: 145 mg/dL — ABNORMAL HIGH (ref 70–99)
Potassium: 5.3 mmol/L — ABNORMAL HIGH (ref 3.5–5.1)
Sodium: 133 mmol/L — ABNORMAL LOW (ref 135–145)

## 2023-10-21 LAB — URINALYSIS, ROUTINE W REFLEX MICROSCOPIC
Bilirubin Urine: NEGATIVE
Glucose, UA: 50 mg/dL — AB
Ketones, ur: 5 mg/dL — AB
Nitrite: NEGATIVE
Protein, ur: >=300 mg/dL — AB
Specific Gravity, Urine: 1.009 (ref 1.005–1.030)
WBC, UA: >50 WBC/hpf (ref 0–5)
pH: 7 (ref 5.0–8.0)

## 2023-10-21 LAB — CBC
HCT: 25.1 % — ABNORMAL LOW (ref 36.0–46.0)
Hemoglobin: 8 g/dL — ABNORMAL LOW (ref 12.0–15.0)
MCH: 30.2 pg (ref 26.0–34.0)
MCHC: 31.9 g/dL (ref 30.0–36.0)
MCV: 94.7 fL (ref 80.0–100.0)
Platelets: 154 K/uL (ref 150–400)
RBC: 2.65 MIL/uL — ABNORMAL LOW (ref 3.87–5.11)
RDW: 14.4 % (ref 11.5–15.5)
WBC: 7.5 K/uL (ref 4.0–10.5)
nRBC: 0 % (ref 0.0–0.2)

## 2023-10-21 LAB — PHOSPHORUS: Phosphorus: 4.8 mg/dL — ABNORMAL HIGH (ref 2.5–4.6)

## 2023-10-21 LAB — MRSA NEXT GEN BY PCR, NASAL: MRSA by PCR Next Gen: DETECTED — AB

## 2023-10-21 LAB — GLUCOSE, CAPILLARY
Glucose-Capillary: 126 mg/dL — ABNORMAL HIGH (ref 70–99)
Glucose-Capillary: 106 mg/dL — ABNORMAL HIGH (ref 70–99)
Glucose-Capillary: 163 mg/dL — ABNORMAL HIGH (ref 70–99)
Glucose-Capillary: 184 mg/dL — ABNORMAL HIGH (ref 70–99)

## 2023-10-21 LAB — TROPONIN I (HIGH SENSITIVITY): Troponin I (High Sensitivity): 214 ng/L (ref <18)

## 2023-10-21 MED ORDER — EPOETIN ALFA-EPBX 4000 UNIT/ML IJ SOLN
4000.0000 [IU] | Freq: Once | INTRAMUSCULAR | Status: AC
  Administered 2023-10-21: 4000 [IU] via INTRAVENOUS

## 2023-10-21 MED ORDER — LINACLOTIDE 145 MCG PO CAPS
145.0000 ug | ORAL_CAPSULE | Freq: Once | ORAL | Status: AC
  Administered 2023-10-21: 145 ug via ORAL
  Filled 2023-10-21: qty 1

## 2023-10-21 MED ORDER — HEPARIN SODIUM (PORCINE) 5000 UNIT/ML IJ SOLN
5000.0000 [IU] | Freq: Two times a day (BID) | INTRAMUSCULAR | Status: DC
  Administered 2023-10-22 – 2023-10-26 (×8): 5000 [IU] via SUBCUTANEOUS
  Filled 2023-10-21 (×9): qty 1

## 2023-10-21 MED ORDER — EPOETIN ALFA-EPBX 4000 UNIT/ML IJ SOLN
INTRAMUSCULAR | Status: AC
  Filled 2023-10-21: qty 1

## 2023-10-21 MED ORDER — CARVEDILOL 3.125 MG PO TABS
3.1250 mg | ORAL_TABLET | Freq: Two times a day (BID) | ORAL | Status: DC
  Administered 2023-10-21 – 2023-10-22 (×3): 3.125 mg via ORAL
  Filled 2023-10-21 (×3): qty 1

## 2023-10-21 MED ORDER — HEPARIN SODIUM (PORCINE) 1000 UNIT/ML IJ SOLN
INTRAMUSCULAR | Status: AC
  Filled 2023-10-21: qty 3

## 2023-10-21 NOTE — Plan of Care (Signed)
  Problem: Education: Goal: Ability to describe self-care measures that may prevent or decrease complications (Diabetes Survival Skills Education) will improve Outcome: Progressing   Problem: Coping: Goal: Ability to adjust to condition or change in health will improve Outcome: Progressing   Problem: Fluid Volume: Goal: Ability to maintain a balanced intake and output will improve Outcome: Progressing   Problem: Metabolic: Goal: Ability to maintain appropriate glucose levels will improve Outcome: Progressing   Problem: Nutritional: Goal: Maintenance of adequate nutrition will improve Outcome: Progressing   Problem: Skin Integrity: Goal: Risk for impaired skin integrity will decrease Outcome: Progressing   Problem: Tissue Perfusion: Goal: Adequacy of tissue perfusion will improve Outcome: Progressing   Problem: Activity: Goal: Risk for activity intolerance will decrease Outcome: Progressing   Problem: Nutrition: Goal: Adequate nutrition will be maintained Outcome: Progressing

## 2023-10-21 NOTE — Assessment & Plan Note (Signed)
 Restarted low-dose Coreg 

## 2023-10-21 NOTE — Assessment & Plan Note (Signed)
 On Lantus  insulin  sliding scale

## 2023-10-21 NOTE — Assessment & Plan Note (Signed)
 Improved.

## 2023-10-21 NOTE — Progress Notes (Signed)
 [facility]  ROUNDING NOTE   Subjective:   Katrina Ortiz  is a 68 y.o. female with past medical history of diabetes, hypertension, CAD, GERD, and end stage renal disease on HD. Patient presents to ED with nausea and vomiting and leg pain. She was admitted overnight for Abnormal EKG [R94.31] Intra-dialytic hypotension [I95.3] Elevated troponin [R79.89] ESBL (extended spectrum beta-lactamase) producing bacteria infection [A49.9, Z16.12] Sepsis (HCC) [A41.9] UTI due to extended-spectrum beta lactamase (ESBL) producing Escherichia coli [N39.0, B96.29, Z16.12]  Patient is known to our practice from previous admission and is a long term resident at [facility], where she receives her dialysis.  Patient is currently seen and evaluated in dialysis.   HEMODIALYSIS FLOWSHEET:  Blood Flow Rate (mL/min): 400 mL/min Arterial Pressure (mmHg): -161.41 mmHg Venous Pressure (mmHg): 224.84 mmHg TMP (mmHg): 5.86 mmHg Ultrafiltration Rate (mL/min): 686 mL/min Dialysate Flow Rate (mL/min): 299 ml/min  She presents to the emergency apartment with hypotension during dialysis.  Patient was recently treated for UTI and discharged on antibiotics.  Continues to complain of generalized malaise and weakness.  Labs on ED arrival concerning for potassium 5.2, sodium 131, glucose 180, BUN 54, creatinine 3.65 with GFR 13, albumin  2.7, troponin 334, lactic acid 3.4, and hemoglobin 7.8.  Blood cultures pending.  Chest x-ray shows pulmonary vascular congestion.  Echo pending.  We have been consulted to manage dialysis needs during this admission.   Objective:  Vital signs in last 24 hours:  Temp:  [97.5 F (36.4 C)-98.5 F (36.9 C)] 98.4 F (36.9 C) (08/30 0910) Pulse Rate:  [61-67] 61 (08/30 0930) Resp:  [10-20] 10 (08/30 0930) BP: (113-186)/(47-76) 174/65 (08/30 0930) SpO2:  [94 %-100 %] 100 % (08/30 0930) Weight:  [83.9 kg-88.4 kg] 88.4 kg (08/30 0910)  Weight change:  Filed Weights   10/20/23  1143 10/21/23 0910  Weight: 83.9 kg 88.4 kg    Intake/Output: I/O last 3 completed shifts: In: 47.9 [IV Piggyback:47.9] Out: 200 [Urine:200]   Intake/Output this shift:  No intake/output data recorded.  Physical Exam: General: Ill-appearing  Head: Normocephalic, atraumatic. Moist oral mucosal membranes  Eyes: Anicteric  Lungs:  Clear to auscultation, [place] O2 patient was admitted  Heart: Regular rate and rhythm  Abdomen:  Soft, nontender  Extremities:  No peripheral edema.  Neurologic: Awake, alert, conversant  Skin: Warm,dry, no rash  Access: Rt AVF    Basic Metabolic Panel: Recent Labs  Lab 10/17/23 1430 10/18/23 0538 10/20/23 1248 10/21/23 0454  NA 131* 135 131* 133*  K 4.4 4.1 5.2* 5.3*  CL 93* 95* 93* 95*  CO2 22 28 25 25   GLUCOSE 247* 158* 180* 145*  BUN 35* 45* 54* 61*  CREATININE 2.20* 2.72* 3.65* 4.37*  CALCIUM  8.9 8.8* 8.5* 8.4*  PHOS  --   --   --  4.8*    Liver Function Tests: Recent Labs  Lab 10/17/23 1430 10/20/23 1248  AST 32 20  ALT 14 11  ALKPHOS 202* 171*  BILITOT 1.1 1.5*  PROT 7.2 6.3*  ALBUMIN  3.1* 2.7*   Recent Labs  Lab 10/17/23 1430 10/20/23 1248  LIPASE 20 20   No results for input(s): AMMONIA in the last 168 hours.  CBC: Recent Labs  Lab 10/17/23 1430 10/18/23 0538 10/20/23 1248 10/21/23 0454  WBC 9.5 6.7 7.4 7.5  HGB 9.3* 8.3* 7.8* 8.0*  HCT 29.5* 26.0* 25.4* 25.1*  MCV 94.6 94.5 96.2 94.7  PLT 196 183 174 154    Cardiac Enzymes: No results for input(s):  CKTOTAL, CKMB, CKMBINDEX, TROPONINI in the last 168 hours.  BNP: Invalid input(s): POCBNP  CBG: Recent Labs  Lab 10/18/23 0742 10/18/23 1153 10/20/23 1655 10/20/23 2123 10/21/23 0756  GLUCAP 133* 135* 136* 169* 126*    Microbiology: Results for orders placed or performed during the hospital encounter of 10/20/23  Blood Culture (routine x 2)     Status: None (Preliminary result)   Collection Time: 10/20/23 12:48 PM   Specimen: BLOOD   Result Value Ref Range Status   Specimen Description BLOOD RIGHT ANTECUBITAL  Final   Special Requests   Final    BOTTLES DRAWN AEROBIC AND ANAEROBIC Blood Culture results may not be optimal due to an inadequate volume of blood received in culture bottles   Culture   Final    NO GROWTH < 24 HOURS Performed at [facility], [address], [place], [place] [address]    Report Status PENDING  Incomplete  Blood Culture (routine x 2)     Status: None (Preliminary result)   Collection Time: 10/20/23  4:32 PM   Specimen: BLOOD  Result Value Ref Range Status   Specimen Description BLOOD BLOOD RIGHT ARM  Final   Special Requests   Final    BOTTLES DRAWN AEROBIC AND ANAEROBIC Blood Culture results may not be optimal due to an inadequate volume of blood received in culture bottles   Culture   Final    NO GROWTH < 24 HOURS Performed at [facility], [address] [place], [place] [address]    Report Status PENDING  Incomplete    Coagulation Studies: No results for input(s): LABPROT, INR in the last 72 hours.  Urinalysis: No results for input(s): COLORURINE, LABSPEC, PHURINE, GLUCOSEU, HGBUR, BILIRUBINUR, KETONESUR, PROTEINUR, UROBILINOGEN, NITRITE, LEUKOCYTESUR in the last 72 hours.  Invalid input(s): APPERANCEUR     Imaging: DG Chest Port 1 View Result Date: 10/20/2023 CLINICAL DATA:  Hypotensive during dialysis. EXAM: PORTABLE CHEST 1 VIEW COMPARISON:  08/28/2023. FINDINGS: Stable cardiomediastinal contours. Stable dialysis catheter with tip at the superior cavoatrial junction. Pulmonary vascular congestion. Similar blunting of the left costophrenic angle could reflect atelectasis/scarring and/or small left pleural effusion. No focal consolidation. No pneumothorax. Diffuse osseous demineralization. No acute osseous abnormality. IMPRESSION: 1. Cardiomegaly with pulmonary vascular congestion. 2. Similar blunting of the left costophrenic  angle could reflect atelectasis/scarring and/or small left pleural effusion. Electronically Signed   By: Harrietta Sherry M.D.   On: 10/20/2023 13:19     Medications:    meropenem  (MERREM ) IV      aspirin  EC  81 mg Oral Daily   Chlorhexidine  Gluconate Cloth  6 each Topical Q0600   diclofenac  Sodium  4 g Topical TID   ezetimibe   10 mg Oral Daily   fluticasone   1 spray Each Nare BID   gabapentin   100 mg Oral BID   guaiFENesin   600 mg Oral BID   [START ON 10/22/2023] heparin  injection (subcutaneous)  5,000 Units Subcutaneous Q12H   insulin  aspart  0-6 Units Subcutaneous TID WC   linaclotide   145 mcg Oral Daily   loratadine   10 mg Oral Daily   methocarbamol   1,000 mg Oral TID   montelukast   10 mg Oral Daily   pantoprazole   40 mg Oral BID   polyethylene glycol  17 g Oral BID   senna-docusate  2 tablet Oral QHS   acetaminophen , artificial tears, hydrALAZINE , lactulose , ondansetron  (ZOFRAN ) IV  Assessment/ Plan:  Ms. Annalyssa Thune is a 68 y.o.  female  with past medical history of diabetes, hypertension, CAD, GERD, and end stage renal disease on HD.   [facility]  End stage renal disease on hemodialysis.  Patient receiving dialysis today, UF 1 L as tolerated.  Next treatment scheduled for Monday.  Will manage dialysis on MWF schedule during this admission.  2. UTI with positive UA.  Failed outpatient antibiotic regimen.  Currently prescribed meropenem .   3. Anemia of chronic kidney disease Lab Results  Component Value Date   HGB 8.0 (L) 10/21/2023    Does receive Mircera at [facility].  Will order low-dose Retacrit  4000 units with dialysis.  4. Secondary Hyperparathyroidism: with outpatient labs: PTH 301, phosphorus 5.4, calcium  8.8 on 09/26/23.   Lab Results  Component Value Date   CALCIUM  8.4 (L) 10/21/2023   PHOS 4.8 (H) 10/21/2023    Patient prescribed sevelamer with meals.  Bone minerals currently acceptable.   LOS: 1 Katrina Ortiz 8/30/20259:57 AM

## 2023-10-21 NOTE — Assessment & Plan Note (Signed)
Dialysis to manage

## 2023-10-21 NOTE — Progress Notes (Signed)
  Echocardiogram 2D Echocardiogram has been performed.  Katrina Ortiz 10/21/2023, 3:29 PM

## 2023-10-21 NOTE — Assessment & Plan Note (Signed)
 Elevated troponin secondary to demand ischemia with hypotension.  No complaints of chest pain or shortness of breath.  On aspirin .

## 2023-10-21 NOTE — Progress Notes (Signed)
 Hemodialysis Note:  Received patient in bed to unit. Alert and oriented. Informed consent singed and in chart.  Treatment initiated: 0922 Treatment completed: 1254  Access used: Right Subclavian catheter Access issues: None  Patient tolerated well. Transported back to room, alert without acute distress. Report given to patient's RN.  Total UF removed: 1.5 liters Medications given: Retacrit  400 units IV  Post HD weight: 86.9 Kg  [facility]

## 2023-10-21 NOTE — Assessment & Plan Note (Addendum)
 On meropenem .  Sepsis ruled out because she did not meet the SIRS criteria.

## 2023-10-21 NOTE — Progress Notes (Signed)
 PT Cancellation Note  Patient Details Name: Brynda Heick MRN: [id number] DOB: 1955-10-19   Cancelled Treatment:    Reason Eval/Treat Not Completed: PT screened, no needs identified, will sign off Per chart, pt is from [facility]TC & total assist at baseline with use of mechanical lift for transfers. No acute PT needs. PT to complete orders.  Richerd Pinal, PT, DPT 10/21/23, 7:47 AM   Richerd CHRISTELLA Pinal 10/21/2023, 7:46 AM

## 2023-10-21 NOTE — Assessment & Plan Note (Addendum)
 Outpatient orthopedic evaluation (Dr. Lorelle)

## 2023-10-21 NOTE — Progress Notes (Signed)
 Progress Note   Patient: Katrina Ortiz FMW:[id number] DOB: December 22, 1955 DOA: 10/20/2023     1 DOS: the patient was seen and examined on 10/21/2023   Brief hospital course: 68 y.o. female with medical history significant of ESRD on HD MTWF, HTN, HLD, CAD status post CABG, chronic HFpEF, chronic iron deficiency anemia, obesity, GERD, presented with feeling of malaise, and hypotension during dialysis.   Patient was just hospitalized earlier this week for UTI and tibial fracture.  She was discharged home 2 days ago with p.o. antibiotics Keflex .  Patient however continued to experience malaise, cramping-like suprapubic abdominal pain but denied back pain or fever or chills.  Feel nauseous but no vomiting, no diarrhea.  Denies any chest pain shortness of breath.  Today, she went to dialysis as usual but was found blood pressure in the and improved to 80s without intervention.   ED Course: Afebrile, no tachycardia no hypotension blood pressure 111/63 O2 saturation 100% on room air.  Blood work showed K5.3 sodium 131 BUN 43, creatinine 3.6 glucose 180 hemoglobin 7.8 compared to baseline 7.8-8.3 WBC 7.4 lactic acid 3.3.  EKG showed chronic ST changes on V1-V3.  8/30.  Patient does not feel great.  Was at dialysis and had low blood pressure and passed out.  Sent in for further evaluation.  Urinary tract infection was positive for ESBL E. coli resistant to Keflex .  Started on meropenem .  Assessment and Plan: * UTI due to extended-spectrum beta lactamase (ESBL) producing Escherichia coli On meropenem .  Sepsis ruled out because she did not meet the SIRS criteria.  Hypotension Continue with dialysis.  Holding Coreg  and losartan .  Tibial plateau fracture, right, sequela Outpatient orthopedic evaluation (Dr. Lorelle)  Chronic heart failure with preserved ejection fraction (HFpEF) (HCC) Currently holding cardiac meds  ESRD (end stage renal disease) on dialysis [facility]) Dialysis today  Hyponatremia Dialysis  to manage  Myocardial injury Elevated troponin secondary to demand ischemia with hypotension.  No complaints of chest pain or shortness of breath.  On aspirin .  Hyperkalemia Dialysis to manage hyperkalemia  Type 2 diabetes mellitus with ESRD (end-stage renal disease) (HCC) On Lantus  insulin  sliding scale     Subjective: Patient not feeling too good.  Sent in for syncope with hypotension with dialysis.  No complaints of chest pain or shortness of breath.  Not really having burning on urination.  Physical Exam: Vitals:   10/21/23 1230 10/21/23 1245 10/21/23 1254 10/21/23 1255  BP: (!) 180/61 131/83 138/61 (!) 156/84  Pulse: 67 60 61 62  Resp: 14 17 18 13   Temp:   (!) 97.4 F (36.3 C) (!) 97.4 F (36.3 C)  TempSrc:   Oral   SpO2: 100% 100% 100% 100%  Weight:    86.9 kg  Height:       Physical Exam HENT:     Head: Normocephalic.  Eyes:     General: Lids are normal.     Conjunctiva/sclera: Conjunctivae normal.  Cardiovascular:     Rate and Rhythm: Normal rate and regular rhythm.     Heart sounds: Normal heart sounds, S1 normal and S2 normal.  Pulmonary:     Breath sounds: No decreased breath sounds, wheezing, rhonchi or rales.  Abdominal:     Palpations: Abdomen is soft.     Tenderness: There is no abdominal tenderness.  Musculoskeletal:     Right lower leg: No swelling.     Left lower leg: No swelling.  Skin:    General: Skin is warm.  Findings: No rash.  Neurological:     Mental Status: She is alert and oriented to person, place, and time.     Data Reviewed: Last hemoglobin A1c 6.7, hemoglobin 8.0, sodium 133, potassium 5.3, creatinine 4.38, GFR 10, troponin trending down to 214  Family Communication: Updated son on the phone  Disposition: Status is: Inpatient Remains inpatient appropriate because: Keep on IV antibiotic for now   Planned Discharge Destination: Long-term care    Time spent: 28 minutes  Author: Charlie Patterson, MD 10/21/2023 1:27  PM  For on call review [email or web address].

## 2023-10-21 NOTE — Assessment & Plan Note (Signed)
 Dialysis today

## 2023-10-21 NOTE — Assessment & Plan Note (Signed)
 Resolved.  Restarted losartan  and increased Coreg  back to usual dose.

## 2023-10-21 NOTE — Hospital Course (Signed)
 68 y.o. female with medical history significant of ESRD on HD MTWF, HTN, HLD, CAD status post CABG, chronic HFpEF, chronic iron deficiency anemia, obesity, GERD, presented with feeling of malaise, and hypotension during dialysis.   Patient was just hospitalized earlier this week for UTI and tibial fracture.  She was discharged home 2 days ago with p.o. antibiotics Keflex .  Patient however continued to experience malaise, cramping-like suprapubic abdominal pain but denied back pain or fever or chills.  Feel nauseous but no vomiting, no diarrhea.  Denies any chest pain shortness of breath.  Today, she went to dialysis as usual but was found blood pressure in the and improved to 80s without intervention.   ED Course: Afebrile, no tachycardia no hypotension blood pressure 111/63 O2 saturation 100% on room air.  Blood work showed K5.3 sodium 131 BUN 43, creatinine 3.6 glucose 180 hemoglobin 7.8 compared to baseline 7.8-8.3 WBC 7.4 lactic acid 3.3.  EKG showed chronic ST changes on V1-V3.  8/30.  Patient does not feel great.  Was at dialysis and had low blood pressure and passed out.  Sent in for further evaluation.  Urinary tract infection was positive for ESBL E. coli resistant to Keflex .  Started on meropenem . 8/31.  Hemoglobin 8.2. 9/1.  Patient seen while on dialysis.  Feeling better than the last couple days. 9/2.  Patient stated that she has not been able to eat since dialysis yesterday.  Asking why she is still having abdominal pain in the lower abdomen and not able to eat.  Reviewed CT scan on admission that shows gallstones without evidence of acute cholecystitis, nonspecific gas in the bladder consider cystitis, small ventral abdominal wall hernia.  Will get a right upper quadrant sonogram tomorrow morning.

## 2023-10-22 DIAGNOSIS — S82141S Displaced bicondylar fracture of right tibia, sequela: Secondary | ICD-10-CM | POA: Diagnosis not present

## 2023-10-22 DIAGNOSIS — I5032 Chronic diastolic (congestive) heart failure: Secondary | ICD-10-CM | POA: Diagnosis not present

## 2023-10-22 DIAGNOSIS — E871 Hypo-osmolality and hyponatremia: Secondary | ICD-10-CM

## 2023-10-22 DIAGNOSIS — I9589 Other hypotension: Secondary | ICD-10-CM | POA: Diagnosis not present

## 2023-10-22 DIAGNOSIS — N390 Urinary tract infection, site not specified: Secondary | ICD-10-CM | POA: Diagnosis not present

## 2023-10-22 LAB — ECHOCARDIOGRAM COMPLETE
AR max vel: 1.56 cm2
AV Peak grad: 7.3 mmHg
Ao pk vel: 1.35 m/s
Area-P 1/2: 4.83 cm2
Height: 63 in
S' Lateral: 2.7 cm
Weight: 2960 [oz_av]

## 2023-10-22 LAB — HEMOGLOBIN: Hemoglobin: 8.2 g/dL — ABNORMAL LOW (ref 12.0–15.0)

## 2023-10-22 LAB — GLUCOSE, CAPILLARY
Glucose-Capillary: 145 mg/dL — ABNORMAL HIGH (ref 70–99)
Glucose-Capillary: 209 mg/dL — ABNORMAL HIGH (ref 70–99)
Glucose-Capillary: 222 mg/dL — ABNORMAL HIGH (ref 70–99)
Glucose-Capillary: 241 mg/dL — ABNORMAL HIGH (ref 70–99)

## 2023-10-22 MED ORDER — POLYVINYL ALCOHOL 1.4 % OP SOLN: 1.0000 [drp] | OPHTHALMIC | Status: DC | PRN

## 2023-10-22 MED ORDER — EPOETIN ALFA-EPBX 10000 UNIT/ML IJ SOLN: 4000.0000 [IU] | INTRAMUSCULAR | Status: DC

## 2023-10-22 NOTE — Progress Notes (Signed)
 Progress Note   Patient: Katrina Ortiz FMW:[id number] DOB: 1955/07/22 DOA: 10/20/2023     2 DOS: the patient was seen and examined on 10/22/2023   Brief hospital course: 68 y.o. female with medical history significant of ESRD on HD MTWF, HTN, HLD, CAD status post CABG, chronic HFpEF, chronic iron deficiency anemia, obesity, GERD, presented with feeling of malaise, and hypotension during dialysis.   Patient was just hospitalized earlier this week for UTI and tibial fracture.  She was discharged home 2 days ago with p.o. antibiotics Keflex .  Patient however continued to experience malaise, cramping-like suprapubic abdominal pain but denied back pain or fever or chills.  Feel nauseous but no vomiting, no diarrhea.  Denies any chest pain shortness of breath.  Today, she went to dialysis as usual but was found blood pressure in the and improved to 80s without intervention.   ED Course: Afebrile, no tachycardia no hypotension blood pressure 111/63 O2 saturation 100% on room air.  Blood work showed K5.3 sodium 131 BUN 43, creatinine 3.6 glucose 180 hemoglobin 7.8 compared to baseline 7.8-8.3 WBC 7.4 lactic acid 3.3.  EKG showed chronic ST changes on V1-V3.  8/30.  Patient does not feel great.  Was at dialysis and had low blood pressure and passed out.  Sent in for further evaluation.  Urinary tract infection was positive for ESBL E. coli resistant to Keflex .  Started on meropenem . 8/31.  Hemoglobin 8.2.  Assessment and Plan: * UTI due to extended-spectrum beta lactamase (ESBL) producing Escherichia coli On meropenem .  Sepsis ruled out because she did not meet the SIRS criteria.  Hypotension Continue with dialysis.  Holding Coreg  and losartan .  Tibial plateau fracture, right, sequela Outpatient orthopedic evaluation (Dr. Lorelle)  Chronic heart failure with preserved ejection fraction (HFpEF) (HCC) Currently holding cardiac meds  ESRD (end stage renal disease) on dialysis [facility]) Dialysis  today  Hyponatremia Dialysis to manage  Myocardial injury Elevated troponin secondary to demand ischemia with hypotension.  No complaints of chest pain or shortness of breath.  On aspirin .  Hyperkalemia Dialysis to manage hyperkalemia  Type 2 diabetes mellitus with ESRD (end-stage renal disease) (HCC) On Lantus  insulin  sliding scale      Subjective: Patient not feeling great but ate a little bit better today.  She states prior to coming into the hospital she was sick to her stomach.  Had a bowel movement yesterday.  No chest pain or shortness of breath.  Admitted with hypotension and syncope  Physical Exam: Vitals:   10/22/23 0052 10/22/23 0440 10/22/23 0844 10/22/23 1105  BP: (!) 152/52 (!) 159/53 (!) 141/55 (!) 151/44  Pulse: 67 64 70 64  Resp: 16 12 20 20   Temp: (!) 96.7 F (35.9 C) 97.7 F (36.5 C) 97.8 F (36.6 C) 98.2 F (36.8 C)  TempSrc:   Oral Axillary  SpO2: 100% 100% 97% 98%  Weight:      Height:       Physical Exam HENT:     Head: Normocephalic.  Eyes:     General: Lids are normal.     Conjunctiva/sclera: Conjunctivae normal.  Cardiovascular:     Rate and Rhythm: Normal rate and regular rhythm.     Heart sounds: Normal heart sounds, S1 normal and S2 normal.  Pulmonary:     Breath sounds: No decreased breath sounds, wheezing, rhonchi or rales.  Abdominal:     Palpations: Abdomen is soft.     Tenderness: There is no abdominal tenderness.  Musculoskeletal:  Right lower leg: No swelling.     Left lower leg: No swelling.  Skin:    General: Skin is warm.     Findings: No rash.  Neurological:     Mental Status: She is alert and oriented to person, place, and time.     Data Reviewed: Hemoglobin 8.2  Family Communication: Spoke with son on the phone  Disposition: Status is: Inpatient Remains inpatient appropriate because: Continue IV antibiotics currently  Planned Discharge Destination: Long-term care    Time spent: 27  minutes  Author: Charlie Patterson, MD 10/22/2023 2:05 PM  For on call review [email or web address].

## 2023-10-22 NOTE — Progress Notes (Signed)
 [facility]  ROUNDING NOTE   Subjective:   Katrina Ortiz  is a 68 y.o. female with past medical history of diabetes, hypertension, CAD, GERD, and end stage renal disease on HD. Patient presents to ED with nausea and vomiting and leg pain. She was admitted overnight for Abnormal EKG [R94.31] Intra-dialytic hypotension [I95.3] Elevated troponin [R79.89] ESBL (extended spectrum beta-lactamase) producing bacteria infection [A49.9, Z16.12] Sepsis (HCC) [A41.9] UTI due to extended-spectrum beta lactamase (ESBL) producing Escherichia coli [N39.0, B96.29, Z16.12]  Patient is known to our practice from previous admission and is a long term resident at [facility], where she receives her dialysis.    Update: Patient seen sitting up  Alert States she feels well Remains on 2L    Objective:  Vital signs in last 24 hours:  Temp:  [96.7 F (35.9 C)-98.4 F (36.9 C)] 97.8 F (36.6 C) (08/31 0844) Pulse Rate:  [60-74] 70 (08/31 0844) Resp:  [9-20] 20 (08/31 0844) BP: (121-180)/(51-84) 141/55 (08/31 0844) SpO2:  [97 %-100 %] 97 % (08/31 0844) Weight:  [86.9 kg] 86.9 kg (08/30 1255)  Weight change: 4.485 kg Filed Weights   10/20/23 1143 10/21/23 0910 10/21/23 1255  Weight: 83.9 kg 88.4 kg 86.9 kg    Intake/Output: I/O last 3 completed shifts: In: 252.2 [P.O.:240; IV Piggyback:12.2] Out: 1850 [Urine:350; Other:1500]   Intake/Output this shift:  Total I/O In: 480 [P.O.:480] Out: -   Physical Exam: General: NAD  Head: Normocephalic, atraumatic. Moist oral mucosal membranes  Eyes: Anicteric  Lungs:  Clear to auscultation, [place] O2 patient was admitted  Heart: Regular rate and rhythm  Abdomen:  Soft, nontender  Extremities:  No peripheral edema.  Neurologic: Awake, alert, conversant  Skin: Warm,dry, no rash  Access: Rt AVF    Basic Metabolic Panel: Recent Labs  Lab 10/17/23 1430 10/18/23 0538 10/20/23 1248 10/21/23 0454  NA 131* 135 131* 133*  K 4.4 4.1 5.2* 5.3*   CL 93* 95* 93* 95*  CO2 22 28 25 25   GLUCOSE 247* 158* 180* 145*  BUN 35* 45* 54* 61*  CREATININE 2.20* 2.72* 3.65* 4.37*  CALCIUM  8.9 8.8* 8.5* 8.4*  PHOS  --   --   --  4.8*    Liver Function Tests: Recent Labs  Lab 10/17/23 1430 10/20/23 1248  AST 32 20  ALT 14 11  ALKPHOS 202* 171*  BILITOT 1.1 1.5*  PROT 7.2 6.3*  ALBUMIN  3.1* 2.7*   Recent Labs  Lab 10/17/23 1430 10/20/23 1248  LIPASE 20 20   No results for input(s): AMMONIA in the last 168 hours.  CBC: Recent Labs  Lab 10/17/23 1430 10/18/23 0538 10/20/23 1248 10/21/23 0454 10/22/23 0503  WBC 9.5 6.7 7.4 7.5  --   HGB 9.3* 8.3* 7.8* 8.0* 8.2*  HCT 29.5* 26.0* 25.4* 25.1*  --   MCV 94.6 94.5 96.2 94.7  --   PLT 196 183 174 154  --     Cardiac Enzymes: No results for input(s): CKTOTAL, CKMB, CKMBINDEX, TROPONINI in the last 168 hours.  BNP: Invalid input(s): POCBNP  CBG: Recent Labs  Lab 10/21/23 0756 10/21/23 1337 10/21/23 1800 10/21/23 2015 10/22/23 0809  GLUCAP 126* 106* 163* 184* 145*    Microbiology: Results for orders placed or performed during the hospital encounter of 10/20/23  Blood Culture (routine x 2)     Status: None (Preliminary result)   Collection Time: 10/20/23 12:48 PM   Specimen: BLOOD  Result Value Ref Range Status   Specimen Description BLOOD  RIGHT ANTECUBITAL  Final   Special Requests   Final    BOTTLES DRAWN AEROBIC AND ANAEROBIC Blood Culture results may not be optimal due to an inadequate volume of blood received in culture bottles   Culture   Final    NO GROWTH 2 DAYS Performed at [facility], [address] [place], [place] [address]    Report Status PENDING  Incomplete  Blood Culture (routine x 2)     Status: None (Preliminary result)   Collection Time: 10/20/23  4:32 PM   Specimen: BLOOD  Result Value Ref Range Status   Specimen Description BLOOD BLOOD RIGHT ARM  Final   Special Requests   Final    BOTTLES DRAWN AEROBIC AND  ANAEROBIC Blood Culture results may not be optimal due to an inadequate volume of blood received in culture bottles   Culture   Final    NO GROWTH 2 DAYS Performed at [facility], [address] [place], [place] [address]    Report Status PENDING  Incomplete  MRSA Next Gen by PCR, Nasal     Status: Abnormal   Collection Time: 10/21/23  8:31 AM   Specimen: Nasal Mucosa; Nasal Swab  Result Value Ref Range Status   MRSA by PCR Next Gen DETECTED (A) NOT DETECTED Final    Comment: RESULT CALLED TO, READ BACK BY AND VERIFIED WITH: Michail Jubilee RN 10/21/23 1038 KG (NOTE) The GeneXpert MRSA Assay (FDA approved for NASAL specimens only), is one component of a comprehensive MRSA colonization surveillance program. It is not intended to diagnose MRSA infection nor to guide or monitor treatment for MRSA infections. Test performance is not FDA approved in patients less than 58 years old. Performed at [facility], [address] [place], [place] [address]     Coagulation Studies: No results for input(s): LABPROT, INR in the last 72 hours.  Urinalysis: Recent Labs    10/21/23 1812  COLORURINE YELLOW*  LABSPEC 1.009  PHURINE 7.0  GLUCOSEU 50*  HGBUR SMALL*  BILIRUBINUR NEGATIVE  KETONESUR 5*  PROTEINUR >=300*  NITRITE NEGATIVE  LEUKOCYTESUR LARGE*       Imaging: DG Chest Port 1 View Result Date: 10/20/2023 CLINICAL DATA:  Hypotensive during dialysis. EXAM: PORTABLE CHEST 1 VIEW COMPARISON:  08/28/2023. FINDINGS: Stable cardiomediastinal contours. Stable dialysis catheter with tip at the superior cavoatrial junction. Pulmonary vascular congestion. Similar blunting of the left costophrenic angle could reflect atelectasis/scarring and/or small left pleural effusion. No focal consolidation. No pneumothorax. Diffuse osseous demineralization. No acute osseous abnormality. IMPRESSION: 1. Cardiomegaly with pulmonary vascular congestion. 2. Similar blunting of the left  costophrenic angle could reflect atelectasis/scarring and/or small left pleural effusion. Electronically Signed   By: Harrietta Sherry M.D.   On: 10/20/2023 13:19     Medications:    meropenem  (MERREM ) IV Stopped (10/21/23 1759)    aspirin  EC  81 mg Oral Daily   carvedilol   3.125 mg Oral BID WC   Chlorhexidine  Gluconate Cloth  6 each Topical Q0600   diclofenac  Sodium  4 g Topical TID   ezetimibe   10 mg Oral Daily   fluticasone   1 spray Each Nare BID   gabapentin   100 mg Oral BID   guaiFENesin   600 mg Oral BID   heparin  injection (subcutaneous)  5,000 Units Subcutaneous Q12H   insulin  aspart  0-6 Units Subcutaneous TID WC   linaclotide   145 mcg Oral Daily   loratadine   10 mg Oral Daily   methocarbamol   1,000  mg Oral TID   montelukast   10 mg Oral Daily   pantoprazole   40 mg Oral BID   polyethylene glycol  17 g Oral BID   senna-docusate  2 tablet Oral QHS   acetaminophen , artificial tears, hydrALAZINE , lactulose   Assessment/ Plan:  [name] is a 68 y.o.  female  with past medical history of diabetes, hypertension, CAD, GERD, and end stage renal disease on HD.   [facility]  End stage renal disease on hemodialysis.  Next treatment scheduled for Monday.    2. UTI with positive UA.  Failed outpatient antibiotic regimen.  Continue prescribed meropenem .   3. Anemia of chronic kidney disease Lab Results  Component Value Date   HGB 8.2 (L) 10/22/2023    Does receive Mircera at [facility].  Will order low-dose Retacrit  4000 units with dialysis tomorrow.  4. Secondary Hyperparathyroidism: with outpatient labs: PTH 301, phosphorus 5.4, calcium  8.8 on 09/26/23.   Lab Results  Component Value Date   CALCIUM  8.4 (L) 10/21/2023   PHOS 4.8 (H) 10/21/2023    Patient prescribed sevelamer with meals.  Calcium  and phos within desired range.   LOS: 2 Rayven Rettig 8/31/202510:56 AM

## 2023-10-23 DIAGNOSIS — I9589 Other hypotension: Secondary | ICD-10-CM | POA: Diagnosis not present

## 2023-10-23 DIAGNOSIS — N390 Urinary tract infection, site not specified: Secondary | ICD-10-CM | POA: Diagnosis not present

## 2023-10-23 DIAGNOSIS — S82141S Displaced bicondylar fracture of right tibia, sequela: Secondary | ICD-10-CM | POA: Diagnosis not present

## 2023-10-23 DIAGNOSIS — I5032 Chronic diastolic (congestive) heart failure: Secondary | ICD-10-CM | POA: Diagnosis not present

## 2023-10-23 LAB — CBC
HCT: 24.9 % — ABNORMAL LOW (ref 36.0–46.0)
Hemoglobin: 8 g/dL — ABNORMAL LOW (ref 12.0–15.0)
MCH: 30 pg (ref 26.0–34.0)
MCHC: 32.1 g/dL (ref 30.0–36.0)
MCV: 93.3 fL (ref 80.0–100.0)
Platelets: 188 K/uL (ref 150–400)
RBC: 2.67 MIL/uL — ABNORMAL LOW (ref 3.87–5.11)
RDW: 14.4 % (ref 11.5–15.5)
WBC: 5.4 K/uL (ref 4.0–10.5)
nRBC: 0 % (ref 0.0–0.2)

## 2023-10-23 LAB — BASIC METABOLIC PANEL WITH GFR
Anion gap: 12 (ref 5–15)
BUN: 46 mg/dL — ABNORMAL HIGH (ref 8–23)
CO2: 26 mmol/L (ref 22–32)
Calcium: 8.3 mg/dL — ABNORMAL LOW (ref 8.9–10.3)
Chloride: 93 mmol/L — ABNORMAL LOW (ref 98–111)
Creatinine, Ser: 3.53 mg/dL — ABNORMAL HIGH (ref 0.44–1.00)
GFR, Estimated: 14 mL/min — ABNORMAL LOW (ref >60)
Glucose, Bld: 191 mg/dL — ABNORMAL HIGH (ref 70–99)
Potassium: 4.4 mmol/L (ref 3.5–5.1)
Sodium: 131 mmol/L — ABNORMAL LOW (ref 135–145)

## 2023-10-23 LAB — GLUCOSE, CAPILLARY
Glucose-Capillary: 194 mg/dL — ABNORMAL HIGH (ref 70–99)
Glucose-Capillary: 245 mg/dL — ABNORMAL HIGH (ref 70–99)

## 2023-10-23 MED ORDER — ACETAMINOPHEN 325 MG PO TABS
ORAL_TABLET | ORAL | Status: AC
  Filled 2023-10-23: qty 2

## 2023-10-23 MED ORDER — LOSARTAN POTASSIUM 50 MG PO TABS
50.0000 mg | ORAL_TABLET | Freq: Every evening | ORAL | Status: DC
  Administered 2023-10-23 – 2023-10-25 (×3): 50 mg via ORAL
  Filled 2023-10-23 (×3): qty 1

## 2023-10-23 MED ORDER — HEPARIN SODIUM (PORCINE) 1000 UNIT/ML IJ SOLN
INTRAMUSCULAR | Status: AC
  Filled 2023-10-23: qty 3

## 2023-10-23 MED ORDER — EPOETIN ALFA-EPBX 4000 UNIT/ML IJ SOLN
INTRAMUSCULAR | Status: AC
  Filled 2023-10-23: qty 1

## 2023-10-23 MED ORDER — CARVEDILOL 6.25 MG PO TABS
6.2500 mg | ORAL_TABLET | Freq: Two times a day (BID) | ORAL | Status: DC
  Administered 2023-10-23 – 2023-10-26 (×6): 6.25 mg via ORAL
  Filled 2023-10-23 (×6): qty 1

## 2023-10-23 MED ORDER — EPOETIN ALFA-EPBX 4000 UNIT/ML IJ SOLN
4000.0000 [IU] | INTRAMUSCULAR | Status: DC
  Administered 2023-10-23 – 2023-10-25 (×2): 4000 [IU] via INTRAVENOUS
  Filled 2023-10-23: qty 1

## 2023-10-23 NOTE — Plan of Care (Signed)
  Problem: Fluid Volume: Goal: Ability to maintain a balanced intake and output will improve Outcome: Progressing   Problem: Health Behavior/Discharge Planning: Goal: Ability to manage health-related needs will improve Outcome: Progressing   Problem: Nutritional: Goal: Maintenance of adequate nutrition will improve Outcome: Progressing   Problem: Skin Integrity: Goal: Risk for impaired skin integrity will decrease Outcome: Progressing   Problem: Tissue Perfusion: Goal: Adequacy of tissue perfusion will improve Outcome: Progressing   Problem: Education: Goal: Knowledge of General Education information will improve Description: Including pain rating scale, medication(s)/side effects and non-pharmacologic comfort measures Outcome: Progressing   Problem: Health Behavior/Discharge Planning: Goal: Ability to manage health-related needs will improve Outcome: Progressing   Problem: Clinical Measurements: Goal: Will remain free from infection Outcome: Progressing Goal: Diagnostic test results will improve Outcome: Progressing Goal: Respiratory complications will improve Outcome: Progressing Goal: Cardiovascular complication will be avoided Outcome: Progressing   Problem: Activity: Goal: Risk for activity intolerance will decrease Outcome: Progressing

## 2023-10-23 NOTE — Care Management Important Message (Signed)
 Important Message  Patient Details  Name: Katrina Ortiz MRN: [id number] Date of Birth: 1955/03/13   Important Message Given:  Yes - Medicare IM     Rojelio [name] 10/23/2023, 1:54 PM

## 2023-10-23 NOTE — Plan of Care (Signed)

## 2023-10-23 NOTE — Progress Notes (Signed)
 Progress Note   Patient: Katrina Ortiz FMW:[id number] DOB: 12-01-1955 DOA: 10/20/2023     3 DOS: the patient was seen and examined on 10/23/2023   Brief hospital course: 68 y.o. female with medical history significant of ESRD on HD MTWF, HTN, HLD, CAD status post CABG, chronic HFpEF, chronic iron deficiency anemia, obesity, GERD, presented with feeling of malaise, and hypotension during dialysis.   Patient was just hospitalized earlier this week for UTI and tibial fracture.  She was discharged home 2 days ago with p.o. antibiotics Keflex .  Patient however continued to experience malaise, cramping-like suprapubic abdominal pain but denied back pain or fever or chills.  Feel nauseous but no vomiting, no diarrhea.  Denies any chest pain shortness of breath.  Today, she went to dialysis as usual but was found blood pressure in the and improved to 80s without intervention.   ED Course: Afebrile, no tachycardia no hypotension blood pressure 111/63 O2 saturation 100% on room air.  Blood work showed K5.3 sodium 131 BUN 43, creatinine 3.6 glucose 180 hemoglobin 7.8 compared to baseline 7.8-8.3 WBC 7.4 lactic acid 3.3.  EKG showed chronic ST changes on V1-V3.  8/30.  Patient does not feel great.  Was at dialysis and had low blood pressure and passed out.  Sent in for further evaluation.  Urinary tract infection was positive for ESBL E. coli resistant to Keflex .  Started on meropenem . 8/31.  Hemoglobin 8.2. 9/1.  Patient seen while on dialysis.  Feeling better than the last couple days.  Assessment and Plan: * UTI due to extended-spectrum beta lactamase (ESBL) producing Escherichia coli On meropenem .  Sepsis ruled out because she did not meet the SIRS criteria.  Hypotension Resolved.  Blood pressure on the higher side today.  Restart losartan  and increase Coreg  back to usual dose.  Tibial plateau fracture, right, sequela Outpatient orthopedic evaluation (Dr. Lorelle)  Chronic heart failure with  preserved ejection fraction (HFpEF) (HCC) Continue Coreg  and losartan   ESRD (end stage renal disease) on dialysis [facility]) Dialysis today  Hyponatremia Dialysis to manage  Myocardial injury Elevated troponin secondary to demand ischemia with hypotension.  No complaints of chest pain or shortness of breath.  On aspirin .  Hyperkalemia Improved  Type 2 diabetes mellitus with ESRD (end-stage renal disease) (HCC) On Lantus  insulin  and sliding scale       Subjective: Patient feels better today than the last 2 days.  She was seen while on dialysis.  Blood pressure has come up.  Will restart losartan  and increase Coreg  back to usual dose.  Physical Exam: Vitals:   10/23/23 1200 10/23/23 1230 10/23/23 1235 10/23/23 1245  BP: (!) 175/64  (!) 172/79   Pulse: 69 70 70   Resp: 12 13 12    Temp:   98 F (36.7 C)   TempSrc:   Oral   SpO2: 100% 100% 100%   Weight:    86.2 kg  Height:       Physical Exam HENT:     Head: Normocephalic.  Eyes:     General: Lids are normal.     Conjunctiva/sclera: Conjunctivae normal.  Cardiovascular:     Rate and Rhythm: Normal rate and regular rhythm.     Heart sounds: Normal heart sounds, S1 normal and S2 normal.  Pulmonary:     Breath sounds: No decreased breath sounds, wheezing, rhonchi or rales.  Abdominal:     Palpations: Abdomen is soft.     Tenderness: There is no abdominal tenderness.  Musculoskeletal:  Right lower leg: No swelling.     Left lower leg: No swelling.  Skin:    General: Skin is warm.     Comments: Chronic lower extremity skin discoloration  Neurological:     Mental Status: She is alert and oriented to person, place, and time.     Data Reviewed: Blood cultures negative for 3 days White blood count 5.4, hemoglobin 8.0, creatinine 3.53, sodium 131  Family Communication: Left message for son  Disposition: Status is: Inpatient Remains inpatient appropriate because: Likely back to rehab tomorrow  Planned Discharge  Destination: Rehab    Time spent: 28 minutes  Author: Charlie Patterson, MD 10/23/2023 1:27 PM  For on call review [email or web address].

## 2023-10-23 NOTE — Progress Notes (Signed)
  Received patient in bed to unit.   Informed consent signed and in chart.    TX duration:3.30 hrs     Transported by  Hand-off given to patient's nurse.    Access used: Right internal jugular  Access issues: none   Total UF removed: 2L Medication(s) given: Epo Post HD VS: wnl Post HD weight: 86.2 kg     N. Madeline Bebout LPN [facility]

## 2023-10-23 NOTE — Progress Notes (Signed)
 [facility]  ROUNDING NOTE   Subjective:   Katrina Ortiz  is a 68 y.o. female with past medical history of diabetes, hypertension, CAD, GERD, and end stage renal disease on HD. Patient presents to ED with nausea and vomiting and leg pain. She was admitted overnight for Abnormal EKG [R94.31] Intra-dialytic hypotension [I95.3] Elevated troponin [R79.89] ESBL (extended spectrum beta-lactamase) producing bacteria infection [A49.9, Z16.12] Sepsis (HCC) [A41.9] UTI due to extended-spectrum beta lactamase (ESBL) producing Escherichia coli [N39.0, B96.29, Z16.12]  Patient is known to our practice from previous admission and is a long term resident at [facility], where she receives her dialysis.    Update: Patient seen and evaluated during dialysis   HEMODIALYSIS FLOWSHEET:  Blood Flow Rate (mL/min): 400 mL/min Arterial Pressure (mmHg): -207.66 mmHg Venous Pressure (mmHg): 223.02 mmHg TMP (mmHg): 4.24 mmHg Ultrafiltration Rate (mL/min): 829 mL/min Dialysate Flow Rate (mL/min): 299 ml/min  No complaints to offer at this time.    Objective:  Vital signs in last 24 hours:  Temp:  [98.1 F (36.7 C)-99.4 F (37.4 C)] 98.2 F (36.8 C) (09/01 0837) Pulse Rate:  [64-70] 65 (09/01 0930) Resp:  [11-20] 11 (09/01 0930) BP: (133-191)/(44-72) 165/67 (09/01 0930) SpO2:  [96 %-100 %] 100 % (09/01 0930) Weight:  [88.2 kg] 88.2 kg (09/01 0841)  Weight change:  Filed Weights   10/21/23 0910 10/21/23 1255 10/23/23 0841  Weight: 88.4 kg 86.9 kg 88.2 kg    Intake/Output: I/O last 3 completed shifts: In: 2020 [P.O.:1920; IV Piggyback:100] Out: 350 [Urine:350]   Intake/Output this shift:  Total I/O In: 120 [P.O.:120] Out: -   Physical Exam: General: NAD  Head: Normocephalic, atraumatic. Moist oral mucosal membranes  Eyes: Anicteric  Lungs:  Clear to auscultation, [place] O2 patient was admitted  Heart: Regular rate and rhythm  Abdomen:  Soft, nontender  Extremities:  No  peripheral edema.  Neurologic: Awake, alert, conversant  Skin: Warm,dry, no rash  Access: Rt AVF    Basic Metabolic Panel: Recent Labs  Lab 10/17/23 1430 10/18/23 0538 10/20/23 1248 10/21/23 0454 10/23/23 0414  NA 131* 135 131* 133* 131*  K 4.4 4.1 5.2* 5.3* 4.4  CL 93* 95* 93* 95* 93*  CO2 22 28 25 25 26   GLUCOSE 247* 158* 180* 145* 191*  BUN 35* 45* 54* 61* 46*  CREATININE 2.20* 2.72* 3.65* 4.37* 3.53*  CALCIUM  8.9 8.8* 8.5* 8.4* 8.3*  PHOS  --   --   --  4.8*  --     Liver Function Tests: Recent Labs  Lab 10/17/23 1430 10/20/23 1248  AST 32 20  ALT 14 11  ALKPHOS 202* 171*  BILITOT 1.1 1.5*  PROT 7.2 6.3*  ALBUMIN  3.1* 2.7*   Recent Labs  Lab 10/17/23 1430 10/20/23 1248  LIPASE 20 20   No results for input(s): AMMONIA in the last 168 hours.  CBC: Recent Labs  Lab 10/17/23 1430 10/18/23 0538 10/20/23 1248 10/21/23 0454 10/22/23 0503 10/23/23 0414  WBC 9.5 6.7 7.4 7.5  --  5.4  HGB 9.3* 8.3* 7.8* 8.0* 8.2* 8.0*  HCT 29.5* 26.0* 25.4* 25.1*  --  24.9*  MCV 94.6 94.5 96.2 94.7  --  93.3  PLT 196 183 174 154  --  188    Cardiac Enzymes: No results for input(s): CKTOTAL, CKMB, CKMBINDEX, TROPONINI in the last 168 hours.  BNP: Invalid input(s): POCBNP  CBG: Recent Labs  Lab 10/21/23 2015 10/22/23 0809 10/22/23 1144 10/22/23 1616 10/22/23 2153  GLUCAP 184* 145*  209* 222* 241*    Microbiology: Results for orders placed or performed during the hospital encounter of 10/20/23  Blood Culture (routine x 2)     Status: None (Preliminary result)   Collection Time: 10/20/23 12:48 PM   Specimen: BLOOD  Result Value Ref Range Status   Specimen Description BLOOD RIGHT ANTECUBITAL  Final   Special Requests   Final    BOTTLES DRAWN AEROBIC AND ANAEROBIC Blood Culture results may not be optimal due to an inadequate volume of blood received in culture bottles   Culture   Final    NO GROWTH 3 DAYS Performed at [facility], [address], [place], [place] [address]    Report Status PENDING  Incomplete  Blood Culture (routine x 2)     Status: None (Preliminary result)   Collection Time: 10/20/23  4:32 PM   Specimen: BLOOD  Result Value Ref Range Status   Specimen Description BLOOD BLOOD RIGHT ARM  Final   Special Requests   Final    BOTTLES DRAWN AEROBIC AND ANAEROBIC Blood Culture results may not be optimal due to an inadequate volume of blood received in culture bottles   Culture   Final    NO GROWTH 3 DAYS Performed at [facility], [address] [place], [place] [address]    Report Status PENDING  Incomplete  MRSA Next Gen by PCR, Nasal     Status: Abnormal   Collection Time: 10/21/23  8:31 AM   Specimen: Nasal Mucosa; Nasal Swab  Result Value Ref Range Status   MRSA by PCR Next Gen DETECTED (A) NOT DETECTED Final    Comment: RESULT CALLED TO, READ BACK BY AND VERIFIED WITH: Michail Jubilee RN 10/21/23 1038 KG (NOTE) The GeneXpert MRSA Assay (FDA approved for NASAL specimens only), is one component of a comprehensive MRSA colonization surveillance program. It is not intended to diagnose MRSA infection nor to guide or monitor treatment for MRSA infections. Test performance is not FDA approved in patients less than 27 years old. Performed at [facility], [address] [place], [place] [address]     Coagulation Studies: No results for input(s): LABPROT, INR in the last 72 hours.  Urinalysis: Recent Labs    10/21/23 1812  COLORURINE YELLOW*  LABSPEC 1.009  PHURINE 7.0  GLUCOSEU 50*  HGBUR SMALL*  BILIRUBINUR NEGATIVE  KETONESUR 5*  PROTEINUR >=300*  NITRITE NEGATIVE  LEUKOCYTESUR LARGE*       Imaging: ECHOCARDIOGRAM COMPLETE Result Date: 10/22/2023    ECHOCARDIOGRAM REPORT   Patient Name:   TAYGEN NEWSOME Date of Exam: 10/21/2023 Medical Rec #:  [id number]      Height:       63.0 in Accession #:    [id number]     Weight:       191.6 lb Date of Birth:  30-Nov-1955      BSA:          1.899 m Patient Age:    68 years       BP:           136/48 mmHg Patient Gender: F              HR:           73 bpm. Exam Location:  [facility] Procedure: 2D Echo, Cardiac Doppler and Color Doppler (Both Spectral and Color            Flow Doppler were utilized during procedure). Indications:  Elevated Troponin  History:         Patient has prior history of Echocardiogram examinations, most                  recent 04/15/2021.  Sonographer:     Thedora Louder RDCS, FASE Referring Phys:  [id number] CORT ONEIDA MANA Diagnosing Phys: Denyse Bathe  Sonographer Comments: Technically difficult study due to poor echo windows. Image acquisition challenging due to patient body habitus. IMPRESSIONS  1. Left ventricular ejection fraction, by estimation, is 60 to 65%. The left ventricle has normal function. The left ventricle has no regional wall motion abnormalities. There is mild left ventricular hypertrophy. Left ventricular diastolic parameters are consistent with Grade I diastolic dysfunction (impaired relaxation).  2. Right ventricular systolic function is normal. The right ventricular size is normal.  3. The mitral valve is normal in structure. No evidence of mitral valve regurgitation. No evidence of mitral stenosis.  4. The aortic valve is normal in structure. Aortic valve regurgitation is trivial. Aortic valve sclerosis/calcification is present, without any evidence of aortic stenosis.  5. The inferior vena cava is normal in size with greater than 50% respiratory variability, suggesting right atrial pressure of 3 mmHg. FINDINGS  Left Ventricle: Left ventricular ejection fraction, by estimation, is 60 to 65%. The left ventricle has normal function. The left ventricle has no regional wall motion abnormalities. Strain was performed and the global longitudinal strain is indeterminate. The left ventricular internal cavity size was normal in size. There is mild left ventricular hypertrophy. Left ventricular  diastolic parameters are consistent with Grade I diastolic dysfunction (impaired relaxation). Right Ventricle: The right ventricular size is normal. No increase in right ventricular wall thickness. Right ventricular systolic function is normal. Left Atrium: Left atrial size was normal in size. Right Atrium: Right atrial size was normal in size. Pericardium: There is no evidence of pericardial effusion. Mitral Valve: The mitral valve is normal in structure. No evidence of mitral valve regurgitation. No evidence of mitral valve stenosis. Tricuspid Valve: The tricuspid valve is normal in structure. Tricuspid valve regurgitation is mild . No evidence of tricuspid stenosis. Aortic Valve: The aortic valve is normal in structure. Aortic valve regurgitation is trivial. Aortic valve sclerosis/calcification is present, without any evidence of aortic stenosis. Aortic valve peak gradient measures 7.3 mmHg. Pulmonic Valve: The pulmonic valve was normal in structure. Pulmonic valve regurgitation is not visualized. No evidence of pulmonic stenosis. Aorta: The aortic root is normal in size and structure. Venous: The inferior vena cava is normal in size with greater than 50% respiratory variability, suggesting right atrial pressure of 3 mmHg. IAS/Shunts: No atrial level shunt detected by color flow Doppler. Additional Comments: 3D was performed not requiring image post processing on an independent workstation and was indeterminate.  LEFT VENTRICLE PLAX 2D LVIDd:         4.10 cm   Diastology LVIDs:         2.70 cm   LV e' medial:    4.57 cm/s LV PW:         1.40 cm   LV E/e' medial:  18.8 LV IVS:        1.00 cm   LV e' lateral:   6.09 cm/s LVOT diam:     1.80 cm   LV E/e' lateral: 14.1 LV SV:         45 LV SV Index:   23 LVOT Area:     2.54 cm  LEFT ATRIUM  Index LA diam:    3.50 cm 1.84 cm/m  AORTIC VALVE AV Area (Vmax): 1.56 cm AV Vmax:        135.00 cm/s AV Peak Grad:   7.3 mmHg LVOT Vmax:      83.00 cm/s LVOT Vmean:      51.100 cm/s LVOT VTI:       0.175 m  AORTA Ao Root diam: 2.80 cm Ao Asc diam:  3.00 cm MITRAL VALVE                TRICUSPID VALVE MV Area (PHT): 4.83 cm     TR Peak grad:   69.6 mmHg MV Decel Time: 157 msec     TR Vmax:        417.00 cm/s MV E velocity: 85.90 cm/s MV A velocity: 120.00 cm/s  SHUNTS MV E/A ratio:  0.72         Systemic VTI:  0.18 m                             Systemic Diam: 1.80 cm Denyse Bathe Electronically signed by Denyse Bathe Signature Date/Time: 10/22/2023/11:14:57 AM    Final      Medications:    meropenem  (MERREM ) IV Stopped (10/22/23 1802)    aspirin  EC  81 mg Oral Daily   carvedilol   3.125 mg Oral BID WC   Chlorhexidine  Gluconate Cloth  6 each Topical Q0600   diclofenac  Sodium  4 g Topical TID   epoetin  alfa-epbx (RETACRIT ) injection  4,000 Units Intravenous Q M,W,F-1800   ezetimibe   10 mg Oral Daily   fluticasone   1 spray Each Nare BID   gabapentin   100 mg Oral BID   guaiFENesin   600 mg Oral BID   heparin  injection (subcutaneous)  5,000 Units Subcutaneous Q12H   insulin  aspart  0-6 Units Subcutaneous TID WC   linaclotide   145 mcg Oral Daily   loratadine   10 mg Oral Daily   methocarbamol   1,000 mg Oral TID   montelukast   10 mg Oral Daily   pantoprazole   40 mg Oral BID   polyethylene glycol  17 g Oral BID   senna-docusate  2 tablet Oral QHS   acetaminophen , artificial tears, hydrALAZINE , lactulose   Assessment/ Plan:  Ms. Corean Yoshimura is a 68 y.o.  female  with past medical history of diabetes, hypertension, CAD, GERD, and end stage renal disease on HD.   [facility]  End stage renal disease on hemodialysis.  Receiving dialysis today, UF goal 1L as tolerated. Next treatment scheduled for Wednesday.    2. UTI with positive UA.  Failed outpatient antibiotic regimen.  Continue prescribed meropenem .   3. Anemia of chronic kidney disease Lab Results  Component Value Date   HGB 8.0 (L) 10/23/2023    Does receive Mircera at [facility].  Continue low-dose  Retacrit  4000 units with dialysis.  4. Secondary Hyperparathyroidism: with outpatient labs: PTH 301, phosphorus 5.4, calcium  8.8 on 09/26/23.   Lab Results  Component Value Date   CALCIUM  8.3 (L) 10/23/2023   PHOS 4.8 (H) 10/21/2023    Patient prescribed sevelamer with meals. Will continue to monitor   LOS: 3 Soliyana Mcchristian 9/1/202510:10 AM

## 2023-10-24 DIAGNOSIS — S82141S Displaced bicondylar fracture of right tibia, sequela: Secondary | ICD-10-CM | POA: Diagnosis not present

## 2023-10-24 DIAGNOSIS — N390 Urinary tract infection, site not specified: Secondary | ICD-10-CM | POA: Diagnosis not present

## 2023-10-24 DIAGNOSIS — I9589 Other hypotension: Secondary | ICD-10-CM | POA: Diagnosis not present

## 2023-10-24 DIAGNOSIS — R1084 Generalized abdominal pain: Secondary | ICD-10-CM

## 2023-10-24 LAB — GLUCOSE, CAPILLARY
Glucose-Capillary: 156 mg/dL — ABNORMAL HIGH (ref 70–99)
Glucose-Capillary: 188 mg/dL — ABNORMAL HIGH (ref 70–99)
Glucose-Capillary: 234 mg/dL — ABNORMAL HIGH (ref 70–99)
Glucose-Capillary: 262 mg/dL — ABNORMAL HIGH (ref 70–99)

## 2023-10-24 MED ORDER — ONDANSETRON HCL 4 MG PO TABS: 4.0000 mg | ORAL_TABLET | Freq: Three times a day (TID) | ORAL | Status: DC | PRN

## 2023-10-24 NOTE — NC FL2 (Signed)
 [place]  MEDICAID FL2 LEVEL OF CARE FORM     IDENTIFICATION  Patient Name: Katrina Ortiz Birthdate: Jul 20, 1955 Sex: female Admission Date (Current Location): 10/20/2023  [place] and [place] Number:  Chiropodist and Address:  [facility], [address], [place], [place] [address]      Provider Number: [id number]  Attending Physician Name and Address:  Josette Ade, MD  Relative Name and Phone Number:       Current Level of Care: Hospital Recommended Level of Care: Skilled Nursing Facility Prior Approval Number:    Date Approved/Denied:   PASRR Number: [id number]  Discharge Plan: SNF    Current Diagnoses: Patient Active Problem List   Diagnosis Date Noted   Hypotension 10/21/2023   Myocardial injury 10/21/2023   Hyponatremia 10/21/2023   Abdominal pain 10/17/2023   Tibial plateau fracture, right, sequela 10/17/2023   HLD (hyperlipidemia) 10/17/2023   Chronic heart failure with preserved ejection fraction (HFpEF) (HCC) 10/17/2023   Obesity (BMI 30-39.9) 10/17/2023   ESRD (end stage renal disease) on dialysis (HCC) 10/17/2023   Type II diabetes mellitus with renal manifestations (HCC) 10/17/2023   Anemia in ESRD (end-stage renal disease) (HCC) 10/17/2023   Pyelonephritis 08/28/2023   Hyperkalemia 04/19/2021   Essential hypertension 04/16/2021   CHF exacerbation (HCC) 04/14/2021   Cellulitis of left wrist    UTI due to extended-spectrum beta lactamase (ESBL) producing Escherichia coli    Cellulitis of right forearm    CKD (chronic kidney disease) stage 4, GFR 15-29 ml/min (HCC)    Anemia of chronic kidney failure, stage 4 (severe) (HCC)    Hx of CABG    Physical deconditioning    Nausea and vomiting 04/11/2021   SOB (shortness of breath)    AKI (acute kidney injury) (HCC) 04/09/2021   Acute on chronic diastolic CHF (congestive heart failure) (HCC) 04/09/2021   Hypothermia 04/09/2021   Type 2 diabetes mellitus with ESRD  (end-stage renal disease) (HCC) 04/14/2019   Acute diastolic CHF (congestive heart failure) (HCC) 04/14/2019   Hypoglycemia 04/14/2019   Acute on chronic hypoxic respiratory failure (HCC) 04/10/2019   Coronary artery disease 05/23/2010    Orientation RESPIRATION BLADDER Height & Weight     Self, Time, Situation, Place  O2 (Nasal Cannula 2 L.) Continent, External catheter Weight: 190 lb 0.6 oz (86.2 kg) Height:  5' 3 (160 cm)  BEHAVIORAL SYMPTOMS/MOOD NEUROLOGICAL BOWEL NUTRITION STATUS   (None)  (None) Incontinent Diet (Regular. Low potassium.)  AMBULATORY STATUS COMMUNICATION OF NEEDS Skin   Total Care Verbally Skin abrasions, Bruising, Other (Comment) (Erythema/redness, rash. Pressure injury on sacrum (moisture barrier) and left hip (no dressing). Wounds on both breasts, left pretibial, and both elbows (no dressing listed).)                       Personal Care Assistance Level of Assistance              Functional Limitations Info  Sight, Speech, Hearing Sight Info: Adequate Hearing Info: Adequate Speech Info: Adequate    SPECIAL CARE FACTORS FREQUENCY                       Contractures Contractures Info: Not present    Additional Factors Info  Isolation Precautions Code Status Info: DNR Allergies Info: Pork-derived products     Isolation Precautions Info: Contact: MRSA, MDR Bacteria     Current Medications (10/24/2023):  This is the current hospital active medication  list Current Facility-Administered Medications  Medication Dose Route Frequency Provider Last Rate Last Admin   acetaminophen  (TYLENOL ) tablet 650 mg  650 mg Oral Q6H PRN Laurita Manor T, MD   650 mg at 10/23/23 2302   artificial tears ophthalmic solution 1 drop  1 drop Both Eyes PRN Laurita Manor T, MD   1 drop at 10/22/23 1054   aspirin  EC tablet 81 mg  81 mg Oral Daily Laurita Manor T, MD   81 mg at 10/24/23 0944   carvedilol  (COREG ) tablet 6.25 mg  6.25 mg Oral BID WC Josette Ade, MD    6.25 mg at 10/24/23 0944   Chlorhexidine  Gluconate Cloth 2 % PADS 6 each  6 each Topical Q0600 Druscilla Bald, NP   6 each at 10/24/23 0558   diclofenac  Sodium (VOLTAREN ) 1 % topical gel 4 g  4 g Topical TID Laurita Manor T, MD   4 g at 10/24/23 0945   epoetin  alfa-epbx (RETACRIT ) injection 4,000 Units  4,000 Units Intravenous Q M,W,F-1800 Josette Ade, MD   4,000 Units at 10/23/23 0950   ezetimibe  (ZETIA ) tablet 10 mg  10 mg Oral Daily Laurita Manor T, MD   10 mg at 10/24/23 0945   fluticasone  (FLONASE ) 50 MCG/ACT nasal spray 1 spray  1 spray Each Nare BID Laurita Manor T, MD   1 spray at 10/24/23 0945   gabapentin  (NEURONTIN ) capsule 100 mg  100 mg Oral BID Laurita Manor T, MD   100 mg at 10/24/23 0945   guaiFENesin  (MUCINEX ) 12 hr tablet 600 mg  600 mg Oral BID Laurita Manor T, MD   600 mg at 10/24/23 0945   heparin  injection 5,000 Units  5,000 Units Subcutaneous Q12H Josette Ade, MD   5,000 Units at 10/24/23 0946   hydrALAZINE  (APRESOLINE ) injection 10 mg  10 mg Intravenous Q6H PRN Laurita Manor T, MD   10 mg at 10/22/23 0539   insulin  aspart (novoLOG ) injection 0-6 Units  0-6 Units Subcutaneous TID WC Laurita Manor DASEN, MD   1 Units at 10/24/23 9053   lactulose  (CHRONULAC ) 10 GM/15ML solution 30 g  30 g Oral Daily PRN Laurita Manor T, MD       linaclotide  (LINZESS ) capsule 145 mcg  145 mcg Oral Daily Laurita Manor T, MD   145 mcg at 10/23/23 1637   loratadine  (CLARITIN ) tablet 10 mg  10 mg Oral Daily Zhang, Ping T, MD   10 mg at 10/24/23 0946   losartan  (COZAAR ) tablet 50 mg  50 mg Oral QPM Wieting, Richard, MD   50 mg at 10/23/23 1641   meropenem  (MERREM ) 500 mg in sodium chloride  0.9 % 100 mL IVPB  500 mg Intravenous Q24H Laurita Manor T, MD 200 mL/hr at 10/23/23 1636 500 mg at 10/23/23 1636   methocarbamol  (ROBAXIN ) tablet 1,000 mg  1,000 mg Oral TID Laurita Manor T, MD   1,000 mg at 10/24/23 0946   montelukast  (SINGULAIR ) tablet 10 mg  10 mg Oral Daily Zhang, Ping T, MD   10 mg at 10/24/23 0946    pantoprazole  (PROTONIX ) EC tablet 40 mg  40 mg Oral BID Jesus America, NP   40 mg at 10/24/23 0947   polyethylene glycol (MIRALAX  / GLYCOLAX ) packet 17 g  17 g Oral BID Laurita Manor DASEN, MD   17 g at 10/24/23 9052     Discharge Medications: Please see discharge summary for a list of discharge medications.  Relevant Imaging Results:  Relevant Lab Results:  Additional Information SS#: [id number]. HD MTWF.  Lauraine [name], LCSW

## 2023-10-24 NOTE — Progress Notes (Signed)
 Progress Note   Patient: Katrina Ortiz FMW:[id number] DOB: 06-Sep-1955 DOA: 10/20/2023     4 DOS: the patient was seen and examined on 10/24/2023   Brief hospital course: 68 y.o. female with medical history significant of ESRD on HD MTWF, HTN, HLD, CAD status post CABG, chronic HFpEF, chronic iron deficiency anemia, obesity, GERD, presented with feeling of malaise, and hypotension during dialysis.   Patient was just hospitalized earlier this week for UTI and tibial fracture.  She was discharged home 2 days ago with p.o. antibiotics Keflex .  Patient however continued to experience malaise, cramping-like suprapubic abdominal pain but denied back pain or fever or chills.  Feel nauseous but no vomiting, no diarrhea.  Denies any chest pain shortness of breath.  Today, she went to dialysis as usual but was found blood pressure in the and improved to 80s without intervention.   ED Course: Afebrile, no tachycardia no hypotension blood pressure 111/63 O2 saturation 100% on room air.  Blood work showed K5.3 sodium 131 BUN 43, creatinine 3.6 glucose 180 hemoglobin 7.8 compared to baseline 7.8-8.3 WBC 7.4 lactic acid 3.3.  EKG showed chronic ST changes on V1-V3.  8/30.  Patient does not feel great.  Was at dialysis and had low blood pressure and passed out.  Sent in for further evaluation.  Urinary tract infection was positive for ESBL E. coli resistant to Keflex .  Started on meropenem . 8/31.  Hemoglobin 8.2. 9/1.  Patient seen while on dialysis.  Feeling better than the last couple days. 9/2.  Patient stated that she has not been able to eat since dialysis yesterday.  Asking why she is still having abdominal pain in the lower abdomen and not able to eat.  Reviewed CT scan on admission that shows gallstones without evidence of acute cholecystitis, nonspecific gas in the bladder consider cystitis, small ventral abdominal wall hernia.  Will get a right upper quadrant sonogram tomorrow morning.  Assessment and  Plan: * UTI due to extended-spectrum beta lactamase (ESBL) producing Escherichia coli On meropenem  (day 5).  I was considering a dose of fosfomycin if I discharged today but will continue meropenem  this evening.  Sepsis ruled out because she did not meet the SIRS criteria.  Abdominal pain Generalized.  Could be secondary to urinary tract infection.  Will get right upper quadrant ultrasound.  Patient with poor appetite and nausea.  This could also be secondary to end-stage renal disease and/or diabetes.  Hypotension Resolved.  Restarted losartan  and increased Coreg  back to usual dose.  Tibial plateau fracture, right, sequela Outpatient orthopedic evaluation (Dr. Lorelle)  Chronic heart failure with preserved ejection fraction (HFpEF) (HCC) Continue Coreg  and losartan   ESRD (end stage renal disease) on dialysis [facility]) Dialysis yesterday  Hyponatremia Dialysis to manage  Myocardial injury Elevated troponin secondary to demand ischemia with hypotension.  No complaints of chest pain or shortness of breath.  On aspirin .  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  Feels nauseous.  Having some abdominal pain.  Admitted with multidrug-resistant urinary tract infection.  Physical Exam: Vitals:   10/24/23 0944 10/24/23 1146 10/24/23 1206 10/24/23 1513  BP: (!) 155/66 (!) 180/69 (!) 180/69 (!) 143/68  Pulse: 65 70  70  Resp:  16  16  Temp:  98.2 F (36.8 C)  97.9 F (36.6 C)  TempSrc:      SpO2:  100%  100%  Weight:      Height:       Physical Exam HENT:     Head: Normocephalic.  Eyes:     General: Lids are normal.     Conjunctiva/sclera: Conjunctivae normal.  Cardiovascular:     Rate and Rhythm: Normal rate and regular rhythm.     Heart sounds: Normal heart sounds, S1 normal and S2 normal.   Pulmonary:     Breath sounds: No decreased breath sounds, wheezing, rhonchi or rales.  Abdominal:     Palpations: Abdomen is soft.     Tenderness: There is abdominal tenderness in the periumbilical area and suprapubic area.  Musculoskeletal:     Right lower leg: No swelling.     Left lower leg: No swelling.  Skin:    General: Skin is warm.     Comments: Chronic lower extremity skin discoloration  Neurological:     Mental Status: She is alert and oriented to person, place, and time.     Data Reviewed: Reviewed CAT scan on admission.  Last creatinine 3.53 last hemoglobin 8.0  Family Communication: Spoke with son on the phone  Disposition: Status is: Inpatient Remains inpatient appropriate because: Will get right upper quadrant sonogram tomorrow morning.  Continue IV meropenem  to complete 5 days this evening.  Can consider fosfomycin.  Planned Discharge Destination: Rehab    Time spent: 28 minutes  Author: Charlie Patterson, MD 10/24/2023 4:36 PM  For on call review [email or web address].

## 2023-10-24 NOTE — Progress Notes (Addendum)
 [facility]  ROUNDING NOTE   Subjective:   Katrina Ortiz  is a 68 y.o. female with past medical history of diabetes, hypertension, CAD, GERD, and end stage renal disease on HD. Patient presents to ED with nausea and vomiting and leg pain. She was admitted overnight for Abnormal EKG [R94.31] Intra-dialytic hypotension [I95.3] Elevated troponin [R79.89] ESBL (extended spectrum beta-lactamase) producing bacteria infection [A49.9, Z16.12] Sepsis (HCC) [A41.9] UTI due to extended-spectrum beta lactamase (ESBL) producing Escherichia coli [N39.0, B96.29, Z16.12]  Patient is known to our practice from previous admission and is a long term resident at [facility], where she receives her dialysis.    Update:  Patient seen laying in bed Breakfast tray at bedside States she doesn't feel well today   Objective:  Vital signs in last 24 hours:  Temp:  [97.9 F (36.6 C)-98.4 F (36.9 C)] 98.2 F (36.8 C) (09/02 1146) Pulse Rate:  [64-75] 70 (09/02 1146) Resp:  [13-20] 16 (09/02 1146) BP: (155-180)/(55-73) 180/69 (09/02 1206) SpO2:  [99 %-100 %] 100 % (09/02 1146)  Weight change:  Filed Weights   10/21/23 1255 10/23/23 0841 10/23/23 1245  Weight: 86.9 kg 88.2 kg 86.2 kg    Intake/Output: I/O last 3 completed shifts: In: 460 [P.O.:360; IV Piggyback:100] Out: 2200 [Urine:200; Other:2000]   Intake/Output this shift:  Total I/O In: 240 [P.O.:240] Out: 75 [Urine:75]  Physical Exam: General: NAD  Head: Normocephalic, atraumatic. Moist oral mucosal membranes  Eyes: Anicteric  Lungs:  Clear to auscultation, [place] O2 patient was admitted  Heart: Regular rate and rhythm  Abdomen:  Soft, nontender  Extremities:  No peripheral edema.  Neurologic: Awake, alert, conversant  Skin: Warm,dry, no rash  Access: Rt AVF (not functioning) Rt internal jugular permcath    Basic Metabolic Panel: Recent Labs  Lab 10/17/23 1430 10/18/23 0538 10/20/23 1248 10/21/23 0454 10/23/23 0414   NA 131* 135 131* 133* 131*  K 4.4 4.1 5.2* 5.3* 4.4  CL 93* 95* 93* 95* 93*  CO2 22 28 25 25 26   GLUCOSE 247* 158* 180* 145* 191*  BUN 35* 45* 54* 61* 46*  CREATININE 2.20* 2.72* 3.65* 4.37* 3.53*  CALCIUM  8.9 8.8* 8.5* 8.4* 8.3*  PHOS  --   --   --  4.8*  --     Liver Function Tests: Recent Labs  Lab 10/17/23 1430 10/20/23 1248  AST 32 20  ALT 14 11  ALKPHOS 202* 171*  BILITOT 1.1 1.5*  PROT 7.2 6.3*  ALBUMIN  3.1* 2.7*   Recent Labs  Lab 10/17/23 1430 10/20/23 1248  LIPASE 20 20   No results for input(s): AMMONIA in the last 168 hours.  CBC: Recent Labs  Lab 10/17/23 1430 10/18/23 0538 10/20/23 1248 10/21/23 0454 10/22/23 0503 10/23/23 0414  WBC 9.5 6.7 7.4 7.5  --  5.4  HGB 9.3* 8.3* 7.8* 8.0* 8.2* 8.0*  HCT 29.5* 26.0* 25.4* 25.1*  --  24.9*  MCV 94.6 94.5 96.2 94.7  --  93.3  PLT 196 183 174 154  --  188    Cardiac Enzymes: No results for input(s): CKTOTAL, CKMB, CKMBINDEX, TROPONINI in the last 168 hours.  BNP: Invalid input(s): POCBNP  CBG: Recent Labs  Lab 10/22/23 2153 10/23/23 1548 10/23/23 2202 10/24/23 0805 10/24/23 1143  GLUCAP 241* 194* 245* 156* 188*    Microbiology: Results for orders placed or performed during the hospital encounter of 10/20/23  Blood Culture (routine x 2)     Status: None (Preliminary result)   Collection  Time: 10/20/23 12:48 PM   Specimen: BLOOD  Result Value Ref Range Status   Specimen Description BLOOD RIGHT ANTECUBITAL  Final   Special Requests   Final    BOTTLES DRAWN AEROBIC AND ANAEROBIC Blood Culture results may not be optimal due to an inadequate volume of blood received in culture bottles   Culture   Final    NO GROWTH 4 DAYS Performed at [facility], [address], [place], [place] [address]    Report Status PENDING  Incomplete  Blood Culture (routine x 2)     Status: None (Preliminary result)   Collection Time: 10/20/23  4:32 PM   Specimen: BLOOD  Result Value Ref  Range Status   Specimen Description BLOOD BLOOD RIGHT ARM  Final   Special Requests   Final    BOTTLES DRAWN AEROBIC AND ANAEROBIC Blood Culture results may not be optimal due to an inadequate volume of blood received in culture bottles   Culture   Final    NO GROWTH 4 DAYS Performed at [facility], [address] [place], [place] [address]    Report Status PENDING  Incomplete  MRSA Next Gen by PCR, Nasal     Status: Abnormal   Collection Time: 10/21/23  8:31 AM   Specimen: Nasal Mucosa; Nasal Swab  Result Value Ref Range Status   MRSA by PCR Next Gen DETECTED (A) NOT DETECTED Final    Comment: RESULT CALLED TO, READ BACK BY AND VERIFIED WITH: Michail Jubilee RN 10/21/23 1038 KG (NOTE) The GeneXpert MRSA Assay (FDA approved for NASAL specimens only), is one component of a comprehensive MRSA colonization surveillance program. It is not intended to diagnose MRSA infection nor to guide or monitor treatment for MRSA infections. Test performance is not FDA approved in patients less than 81 years old. Performed at [facility], [address] [place], [place] [address]     Coagulation Studies: No results for input(s): LABPROT, INR in the last 72 hours.  Urinalysis: Recent Labs    10/21/23 1812  COLORURINE YELLOW*  LABSPEC 1.009  PHURINE 7.0  GLUCOSEU 50*  HGBUR SMALL*  BILIRUBINUR NEGATIVE  KETONESUR 5*  PROTEINUR >=300*  NITRITE NEGATIVE  LEUKOCYTESUR LARGE*       Imaging: No results found.    Medications:    meropenem  (MERREM ) IV 500 mg (10/23/23 1636)    aspirin  EC  81 mg Oral Daily   carvedilol   6.25 mg Oral BID WC   Chlorhexidine  Gluconate Cloth  6 each Topical Q0600   diclofenac  Sodium  4 g Topical TID   epoetin  alfa-epbx (RETACRIT ) injection  4,000 Units Intravenous Q M,W,F-1800   ezetimibe   10 mg Oral Daily   fluticasone   1 spray Each Nare BID   gabapentin   100 mg Oral BID   guaiFENesin   600 mg Oral BID   heparin  injection  (subcutaneous)  5,000 Units Subcutaneous Q12H   insulin  aspart  0-6 Units Subcutaneous TID WC   linaclotide   145 mcg Oral Daily   loratadine   10 mg Oral Daily   losartan   50 mg Oral QPM   methocarbamol   1,000 mg Oral TID   montelukast   10 mg Oral Daily   pantoprazole   40 mg Oral BID   polyethylene glycol  17 g Oral BID   acetaminophen , artificial tears, hydrALAZINE , lactulose   Assessment/ Plan:  Ms. Vernice Mannina is a 68 y.o.  female  with past medical history of diabetes, hypertension, CAD, GERD, and end stage  renal disease on HD.   [facility]  End stage renal disease on hemodialysis.  Dialysis received yesterday, UF 2L achieved. Next treatment scheduled for Wednesday.    2. UTI with positive UA.  Failed outpatient antibiotic regimen.  Continue prescribed meropenem , managed by primary team.   3. Anemia of chronic kidney disease Lab Results  Component Value Date   HGB 8.0 (L) 10/23/2023    Does receive Mircera at [facility].  Continue  Retacrit  4000 units with dialysis.  4. Secondary Hyperparathyroidism: with outpatient labs: PTH 301, phosphorus 5.4, calcium  8.8 on 09/26/23.   Lab Results  Component Value Date   CALCIUM  8.3 (L) 10/23/2023   PHOS 4.8 (H) 10/21/2023    Patient prescribed sevelamer with meals. Bone minerals acceptable.    LOS: 4 Margel Joens 9/2/20251:27 PM

## 2023-10-24 NOTE — Plan of Care (Signed)

## 2023-10-24 NOTE — Assessment & Plan Note (Signed)
 Generalized.  Could be secondary to urinary tract infection.  Will get right upper quadrant ultrasound.  Patient with poor appetite and nausea.  This could also be secondary to end-stage renal disease and/or diabetes.

## 2023-10-24 NOTE — TOC Initial Note (Addendum)
 Transition of Care [facility]) - Initial/Assessment Note    Patient Details  Name: Katrina Ortiz MRN: [id number] Date of Birth: 08-03-1955  [facility] CM/SW Contact:    Katrina [name], LCSW Phone Number: 10/24/2023, 10:38 AM  Clinical Narrative:  CSW met with patient. No family at bedside. CSW introduced role and explained that discharge planning. She confirmed that she is a long-term resident at [facility] and plan is to return there at discharge. No further concerns. CSW will continue to follow patient for support and facilitate return to SNF once medically stable.       1:47 pm: Medical readiness date is listed as tomorrow. SNF is aware.           Expected Discharge Plan: Skilled Nursing Facility Barriers to Discharge: Continued Medical Work up   Patient Goals and CMS Choice     Choice offered to / list presented to : NA      Expected Discharge Plan and Services     Post Acute Care Choice: Resumption of Svcs/PTA Provider Living arrangements for the past 2 months: Skilled Nursing Facility                                      Prior Living Arrangements/Services Living arrangements for the past 2 months: Skilled Nursing Facility Lives with:: Facility Resident Patient language and need for interpreter reviewed:: Yes Do you feel safe going back to the place where you live?: Yes      Need for Family Participation in Patient Care: Yes (Comment) Care giver support system in place?: Yes (comment)   Criminal Activity/Legal Involvement Pertinent to Current Situation/Hospitalization: No - Comment as needed  Activities of Daily Living   ADL Screening (condition at time of admission) Independently performs ADLs?: No Does the patient have a NEW difficulty with bathing/dressing/toileting/self-feeding that is expected to last >3 days?: No Does the patient have a NEW difficulty with getting in/out of bed, walking, or climbing stairs that is expected to  last >3 days?: No Does the patient have a NEW difficulty with communication that is expected to last >3 days?: No Is the patient deaf or have difficulty hearing?: No Does the patient have difficulty seeing, even when wearing glasses/contacts?: Yes Does the patient have difficulty concentrating, remembering, or making decisions?: No  Permission Sought/Granted Permission sought to share information with : Facility Industrial/product designer granted to share information with : Yes, Verbal Permission Granted     Permission granted to share info w AGENCY: [facility]        Emotional Assessment Appearance:: Appears stated age Attitude/Demeanor/Rapport: Engaged, Gracious Affect (typically observed): Accepting, Appropriate, Calm, Pleasant Orientation: : Oriented to Self, Oriented to Place, Oriented to  Time, Oriented to Situation Alcohol  / Substance Use: Not Applicable Psych Involvement: No (comment)  Admission diagnosis:  Abnormal EKG [R94.31] Intra-dialytic hypotension [I95.3] Elevated troponin [R79.89] ESBL (extended spectrum beta-lactamase) producing bacteria infection [A49.9, Z16.12] Sepsis (HCC) [A41.9] UTI due to extended-spectrum beta lactamase (ESBL) producing Escherichia coli [N39.0, B96.29, Z16.12] Patient Active Problem List   Diagnosis Date Noted   Hypotension 10/21/2023   Myocardial injury 10/21/2023   Hyponatremia 10/21/2023   Abdominal pain 10/17/2023   Tibial plateau fracture, right, sequela 10/17/2023   HLD (hyperlipidemia) 10/17/2023   Chronic heart failure with preserved ejection fraction (HFpEF) (HCC) 10/17/2023   Obesity (BMI 30-39.9) 10/17/2023   ESRD (end stage renal  disease) on dialysis (HCC) 10/17/2023   Type II diabetes mellitus with renal manifestations (HCC) 10/17/2023   Anemia in ESRD (end-stage renal disease) (HCC) 10/17/2023   Pyelonephritis 08/28/2023   Hyperkalemia 04/19/2021   Essential hypertension 04/16/2021   CHF exacerbation  (HCC) 04/14/2021   Cellulitis of left wrist    UTI due to extended-spectrum beta lactamase (ESBL) producing Escherichia coli    Cellulitis of right forearm    CKD (chronic kidney disease) stage 4, GFR 15-29 ml/min (HCC)    Anemia of chronic kidney failure, stage 4 (severe) (HCC)    Hx of CABG    Physical deconditioning    Nausea and vomiting 04/11/2021   SOB (shortness of breath)    AKI (acute kidney injury) (HCC) 04/09/2021   Acute on chronic diastolic CHF (congestive heart failure) (HCC) 04/09/2021   Hypothermia 04/09/2021   Type 2 diabetes mellitus with ESRD (end-stage renal disease) (HCC) 04/14/2019   Acute diastolic CHF (congestive heart failure) (HCC) 04/14/2019   Hypoglycemia 04/14/2019   Acute on chronic hypoxic respiratory failure (HCC) 04/10/2019   Coronary artery disease 05/23/2010   PCP:  Relda Heron LABOR, MD Pharmacy:   [facility] [address] [place] [facility] [address] [place] [address] Phone: [phone] Fax: [phone]     Social Drivers of Health (SDOH) Social History: SDOH Screenings   Food Insecurity: No Food Insecurity (10/21/2023)  Housing: High Risk (10/21/2023)  Transportation Needs: No Transportation Needs (10/21/2023)  Utilities: Not At Risk (10/21/2023)  Financial Resource Strain: Low Risk  (07/06/2023)   Received from [facility]  Physical Activity: Inactive (07/08/2020)   Received from [facility]  Social Connections: Unknown (10/21/2023)  Stress: No Stress Concern Present (07/08/2020)   Received from [facility]  Tobacco Use: Medium Risk (10/21/2023)   SDOH Interventions:     Readmission Risk Interventions    04/16/2021   10:42 AM  Readmission Risk Prevention Plan  Transportation Screening Complete  PCP or Specialist Appt within 3-5 Days Complete  Social Work Consult for Recovery Care Planning/Counseling Complete  Palliative Care Screening Not Applicable  Medication Review Oceanographer) Complete

## 2023-10-25 ENCOUNTER — Inpatient Hospital Stay

## 2023-10-25 DIAGNOSIS — B9629 Other Escherichia coli [E. coli] as the cause of diseases classified elsewhere: Secondary | ICD-10-CM | POA: Diagnosis not present

## 2023-10-25 DIAGNOSIS — N390 Urinary tract infection, site not specified: Secondary | ICD-10-CM | POA: Diagnosis not present

## 2023-10-25 DIAGNOSIS — Z1612 Extended spectrum beta lactamase (ESBL) resistance: Secondary | ICD-10-CM | POA: Diagnosis not present

## 2023-10-25 LAB — GLUCOSE, CAPILLARY
Glucose-Capillary: 148 mg/dL — ABNORMAL HIGH (ref 70–99)
Glucose-Capillary: 264 mg/dL — ABNORMAL HIGH (ref 70–99)
Glucose-Capillary: 257 mg/dL — ABNORMAL HIGH (ref 70–99)

## 2023-10-25 LAB — BASIC METABOLIC PANEL WITH GFR
Anion gap: 16 — ABNORMAL HIGH (ref 5–15)
BUN: 38 mg/dL — ABNORMAL HIGH (ref 8–23)
CO2: 23 mmol/L (ref 22–32)
Calcium: 8.8 mg/dL — ABNORMAL LOW (ref 8.9–10.3)
Chloride: 95 mmol/L — ABNORMAL LOW (ref 98–111)
Creatinine, Ser: 3.43 mg/dL — ABNORMAL HIGH (ref 0.44–1.00)
GFR, Estimated: 14 mL/min — ABNORMAL LOW (ref >60)
Glucose, Bld: 188 mg/dL — ABNORMAL HIGH (ref 70–99)
Potassium: 4.8 mmol/L (ref 3.5–5.1)
Sodium: 134 mmol/L — ABNORMAL LOW (ref 135–145)

## 2023-10-25 LAB — CBC
HCT: 28.6 % — ABNORMAL LOW (ref 36.0–46.0)
Hemoglobin: 9.2 g/dL — ABNORMAL LOW (ref 12.0–15.0)
MCH: 30.3 pg (ref 26.0–34.0)
MCHC: 32.2 g/dL (ref 30.0–36.0)
MCV: 94.1 fL (ref 80.0–100.0)
Platelets: 204 K/uL (ref 150–400)
RBC: 3.04 MIL/uL — ABNORMAL LOW (ref 3.87–5.11)
RDW: 14.6 % (ref 11.5–15.5)
WBC: 5.8 K/uL (ref 4.0–10.5)
nRBC: 0 % (ref 0.0–0.2)

## 2023-10-25 LAB — CULTURE, BLOOD (ROUTINE X 2)
Culture: NO GROWTH
Culture: NO GROWTH

## 2023-10-25 MED ORDER — EPOETIN ALFA-EPBX 4000 UNIT/ML IJ SOLN
INTRAMUSCULAR | Status: AC
Start: 2023-10-25 — End: 2023-10-25
  Filled 2023-10-25: qty 1

## 2023-10-25 MED ORDER — HEPARIN SODIUM (PORCINE) 1000 UNIT/ML IJ SOLN
INTRAMUSCULAR | Status: AC
  Filled 2023-10-25: qty 4

## 2023-10-25 NOTE — Plan of Care (Signed)

## 2023-10-25 NOTE — TOC Progression Note (Signed)
 Transition of Care [facility]) - Progression Note    Patient Details  Name: Katrina Ortiz MRN: [id number] Date of Birth: 10/14/1955  Transition of Care [facility]) CM/SW Contact  Lauraine [name], LCSW Phone Number: 10/25/2023, 3:05 PM  Clinical Narrative:   SNF can accept patient back tomorrow if stable.  Expected Discharge Plan: Skilled Nursing Facility Barriers to Discharge: Continued Medical Work up               Expected Discharge Plan and Services     Post Acute Care Choice: Resumption of Svcs/PTA Provider Living arrangements for the past 2 months: Skilled Nursing Facility                                       Social Drivers of Health (SDOH) Interventions SDOH Screenings   Food Insecurity: No Food Insecurity (10/21/2023)  Housing: High Risk (10/21/2023)  Transportation Needs: No Transportation Needs (10/21/2023)  Utilities: Not At Risk (10/21/2023)  Financial Resource Strain: Low Risk  (07/06/2023)   Received from [facility]  Physical Activity: Inactive (07/08/2020)   Received from [facility]  Social Connections: Unknown (10/21/2023)  Stress: No Stress Concern Present (07/08/2020)   Received from [facility]  Tobacco Use: Medium Risk (10/21/2023)    Readmission Risk Interventions    04/16/2021   10:42 AM  Readmission Risk Prevention Plan  Transportation Screening Complete  PCP or Specialist Appt within 3-5 Days Complete  Social Work Consult for Recovery Care Planning/Counseling Complete  Palliative Care Screening Not Applicable  Medication Review Oceanographer) Complete

## 2023-10-25 NOTE — Progress Notes (Signed)
 [facility]  ROUNDING NOTE   Subjective:   Katrina Ortiz  is a 68 y.o. female with past medical history of diabetes, hypertension, CAD, GERD, and end stage renal disease on HD. Patient presents to ED with nausea and vomiting and leg pain. She was admitted overnight for Abnormal EKG [R94.31] Intra-dialytic hypotension [I95.3] Elevated troponin [R79.89] ESBL (extended spectrum beta-lactamase) producing bacteria infection [A49.9, Z16.12] Sepsis (HCC) [A41.9] UTI due to extended-spectrum beta lactamase (ESBL) producing Escherichia coli [N39.0, B96.29, Z16.12]  Patient is known to our practice from previous admission and is a long term resident at [facility], where she receives her dialysis.    Update:  Patient seen and evaluated during dialysis   HEMODIALYSIS FLOWSHEET:  Blood Flow Rate (mL/min): 350 mL/min Arterial Pressure (mmHg): -279.38 mmHg Venous Pressure (mmHg): 155.14 mmHg TMP (mmHg): 11.51 mmHg Ultrafiltration Rate (mL/min): 829 mL/min Dialysate Flow Rate (mL/min): 300 ml/min  Resting comfortably during treatment    Objective:  Vital signs in last 24 hours:  Temp:  [97.5 F (36.4 C)-98.2 F (36.8 C)] 97.8 F (36.6 C) (09/03 0845) Pulse Rate:  [62-70] 66 (09/03 0900) Resp:  [16-21] 21 (09/03 0900) BP: (138-180)/(53-76) 172/61 (09/03 0900) SpO2:  [98 %-100 %] 98 % (09/03 0845)  Weight change:  Filed Weights   10/21/23 1255 10/23/23 0841 10/23/23 1245  Weight: 86.9 kg 88.2 kg 86.2 kg    Intake/Output: I/O last 3 completed shifts: In: 960 [P.O.:960] Out: 650 [Urine:650]   Intake/Output this shift:  Total I/O In: -  Out: 100 [Urine:100]  Physical Exam: General: NAD  Head: Normocephalic, atraumatic. Moist oral mucosal membranes  Eyes: Anicteric  Lungs:  Clear to auscultation, [place] O2   Heart: Regular rate and rhythm  Abdomen:  Soft, nontender  Extremities:  No peripheral edema.  Neurologic: Awake, alert, conversant  Skin: Warm,dry, no rash   Access: Rt AVF (not functioning) Rt internal jugular permcath    Basic Metabolic Panel: Recent Labs  Lab 10/20/23 1248 10/21/23 0454 10/23/23 0414 10/25/23 0455  NA 131* 133* 131* 134*  K 5.2* 5.3* 4.4 4.8  CL 93* 95* 93* 95*  CO2 25 25 26 23   GLUCOSE 180* 145* 191* 188*  BUN 54* 61* 46* 38*  CREATININE 3.65* 4.37* 3.53* 3.43*  CALCIUM  8.5* 8.4* 8.3* 8.8*  PHOS  --  4.8*  --   --     Liver Function Tests: Recent Labs  Lab 10/20/23 1248  AST 20  ALT 11  ALKPHOS 171*  BILITOT 1.5*  PROT 6.3*  ALBUMIN  2.7*   Recent Labs  Lab 10/20/23 1248  LIPASE 20   No results for input(s): AMMONIA in the last 168 hours.  CBC: Recent Labs  Lab 10/20/23 1248 10/21/23 0454 10/22/23 0503 10/23/23 0414 10/25/23 0455  WBC 7.4 7.5  --  5.4 5.8  HGB 7.8* 8.0* 8.2* 8.0* 9.2*  HCT 25.4* 25.1*  --  24.9* 28.6*  MCV 96.2 94.7  --  93.3 94.1  PLT 174 154  --  188 204    Cardiac Enzymes: No results for input(s): CKTOTAL, CKMB, CKMBINDEX, TROPONINI in the last 168 hours.  BNP: Invalid input(s): POCBNP  CBG: Recent Labs  Lab 10/23/23 2202 10/24/23 0805 10/24/23 1143 10/24/23 1703 10/24/23 2154  GLUCAP 245* 156* 188* 234* 262*    Microbiology: Results for orders placed or performed during the hospital encounter of 10/20/23  Blood Culture (routine x 2)     Status: None (Preliminary result)   Collection Time: 10/20/23 12:48  PM   Specimen: BLOOD  Result Value Ref Range Status   Specimen Description BLOOD RIGHT ANTECUBITAL  Final   Special Requests   Final    BOTTLES DRAWN AEROBIC AND ANAEROBIC Blood Culture results may not be optimal due to an inadequate volume of blood received in culture bottles   Culture   Final    NO GROWTH 4 DAYS Performed at [facility], [address], [place], [place] [address]    Report Status PENDING  Incomplete  Blood Culture (routine x 2)     Status: None (Preliminary result)   Collection Time: 10/20/23  4:32 PM    Specimen: BLOOD  Result Value Ref Range Status   Specimen Description BLOOD BLOOD RIGHT ARM  Final   Special Requests   Final    BOTTLES DRAWN AEROBIC AND ANAEROBIC Blood Culture results may not be optimal due to an inadequate volume of blood received in culture bottles   Culture   Final    NO GROWTH 4 DAYS Performed at [facility], [address] [place], [place] [address]    Report Status PENDING  Incomplete  MRSA Next Gen by PCR, Nasal     Status: Abnormal   Collection Time: 10/21/23  8:31 AM   Specimen: Nasal Mucosa; Nasal Swab  Result Value Ref Range Status   MRSA by PCR Next Gen DETECTED (A) NOT DETECTED Final    Comment: RESULT CALLED TO, READ BACK BY AND VERIFIED WITH: Michail Jubilee RN 10/21/23 1038 KG (NOTE) The GeneXpert MRSA Assay (FDA approved for NASAL specimens only), is one component of a comprehensive MRSA colonization surveillance program. It is not intended to diagnose MRSA infection nor to guide or monitor treatment for MRSA infections. Test performance is not FDA approved in patients less than 4 years old. Performed at [facility], [address] [place], [place] [address]     Coagulation Studies: No results for input(s): LABPROT, INR in the last 72 hours.  Urinalysis: No results for input(s): COLORURINE, LABSPEC, PHURINE, GLUCOSEU, HGBUR, BILIRUBINUR, KETONESUR, PROTEINUR, UROBILINOGEN, NITRITE, LEUKOCYTESUR in the last 72 hours.  Invalid input(s): APPERANCEUR      Imaging: US  Abdomen Limited RUQ (LIVER/GB) Result Date: 10/25/2023 CLINICAL DATA:  Abdominal pain. EXAM: ULTRASOUND ABDOMEN LIMITED RIGHT UPPER QUADRANT COMPARISON:  CT abdomen pelvis 10/17/2023 and right upper quadrant ultrasound 04/11/2021. FINDINGS: Gallbladder: Shadowing echogenic stone measures 5 mm. No wall thickening or sonographic Murphy sign. No pericholecystic fluid. Common bile duct: Diameter: 3 mm, within normal limits. No  intrahepatic biliary ductal dilatation. Liver: Diffusely increased in echogenicity. No definite focal lesion. Portal vein is patent on color Doppler imaging with normal direction of blood flow towards the liver. Other: None. IMPRESSION: 1. No acute findings. 2. Cholelithiasis. 3. Hepatic steatosis. Electronically Signed   By: Newell Eke M.D.   On: 10/25/2023 08:35      Medications:    meropenem  (MERREM ) IV 500 mg (10/24/23 1834)    aspirin  EC  81 mg Oral Daily   carvedilol   6.25 mg Oral BID WC   Chlorhexidine  Gluconate Cloth  6 each Topical Q0600   diclofenac  Sodium  4 g Topical TID   epoetin  alfa-epbx (RETACRIT ) injection  4,000 Units Intravenous Q M,W,F-1800   ezetimibe   10 mg Oral Daily   fluticasone   1 spray Each Nare BID   gabapentin   100 mg Oral BID   guaiFENesin   600 mg Oral BID   heparin  injection (subcutaneous)  5,000 Units Subcutaneous Q12H  insulin  aspart  0-6 Units Subcutaneous TID WC   linaclotide   145 mcg Oral Daily   loratadine   10 mg Oral Daily   losartan   50 mg Oral QPM   methocarbamol   1,000 mg Oral TID   montelukast   10 mg Oral Daily   pantoprazole   40 mg Oral BID   polyethylene glycol  17 g Oral BID   acetaminophen , artificial tears, hydrALAZINE , lactulose , ondansetron   Assessment/ Plan:  [name] is a 68 y.o.  female  with past medical history of diabetes, hypertension, CAD, GERD, and end stage renal disease on HD.   [facility]  End stage renal disease on hemodialysis.  Patient receiving today, UF 2 L as tolerated.  Next treatment scheduled for Friday.  2. UTI with positive UA.  Failed outpatient antibiotic regimen.  Continue prescribed daily meropenem , managed by primary team.   3. Anemia of chronic kidney disease Lab Results  Component Value Date   HGB 9.2 (L) 10/25/2023    Does receive Mircera at [facility].  Continue IV Retacrit  with dialysis.  4. Secondary Hyperparathyroidism: with outpatient labs: PTH 301, phosphorus 5.4, calcium   8.8 on 09/26/23.   Lab Results  Component Value Date   CALCIUM  8.8 (L) 10/25/2023   PHOS 4.8 (H) 10/21/2023    Patient prescribed sevelamer with meals.  Calcium  and phosphorus within desired range for renal patient.   LOS: 5 Ryne Mctigue 9/3/20259:39 AM

## 2023-10-25 NOTE — Inpatient Diabetes Management (Signed)
 Inpatient Diabetes Program Recommendations  AACE/ADA: New Consensus Statement on Inpatient Glycemic Control (2015)  Target Ranges:  Prepandial:   less than 140 mg/dL      Peak postprandial:   less than 180 mg/dL (1-2 hours)      Critically ill patients:  140 - 180 mg/dL   Lab Results  Component Value Date   GLUCAP 262 (H) 10/24/2023   HGBA1C 6.7 (H) 08/30/2023    Review of Glycemic Control  Latest Reference Range & Units 10/24/23 08:05 10/24/23 11:43 10/24/23 17:03 10/24/23 21:54  Glucose-Capillary 70 - 99 mg/dL 843 (H) 811 (H) 765 (H) 262 (H)   Diabetes history: DM 2 Outpatient Diabetes medications:  Novolog  2-10 units tid with meals (correction over 200  mg/dL) Lantus  10 units daily Current orders for Inpatient glycemic control:  Novolog  0-6 units tid with meals   Inpatient Diabetes Program Recommendations:   Consider adding Lantus  8 units daily if appropriate.   Thanks,  Randall Bullocks, RN, BC-ADM Inpatient Diabetes Coordinator Pager [phone]  (8a-5p)

## 2023-10-25 NOTE — Progress Notes (Signed)
 Progress Note   Patient: Katrina Ortiz FMW:[id number] DOB: 11-25-1955 DOA: 10/20/2023     5 DOS: the patient was seen and examined on 10/25/2023   Brief hospital course: :68 y.o. female with medical history significant of ESRD on HD MTWF, HTN, HLD, CAD status post CABG, chronic HFpEF, chronic iron deficiency anemia, obesity, GERD, presented with feeling of malaise, and hypotension during dialysis.   Patient was just hospitalized earlier this week for UTI and tibial fracture.  She was discharged home 2 days ago with p.o. antibiotics Keflex .  Patient however continued to experience malaise, cramping-like suprapubic abdominal pain but denied back pain or fever or chills.  Feel nauseous but no vomiting, no diarrhea.  Denies any chest pain shortness of breath.  Today, she went to dialysis as usual but was found blood pressure in the and improved to 80s without intervention.   ED Course: Afebrile, no tachycardia no hypotension blood pressure 111/63 O2 saturation 100% on room air.  Blood work showed K5.3 sodium 131 BUN 43, creatinine 3.6 glucose 180 hemoglobin 7.8 compared to baseline 7.8-8.3 WBC 7.4 lactic acid 3.3.  EKG showed chronic ST changes on V1-V3.  8/30.  Patient does not feel great.  Was at dialysis and had low blood pressure and passed out.  Sent in for further evaluation.  Urinary tract infection was positive for ESBL E. coli resistant to Keflex .  Started on meropenem . 8/31.  Hemoglobin 8.2. 9/1.  Patient seen while on dialysis.  Feeling better than the last couple days. 9/2.  Patient stated that she has not been able to eat since dialysis yesterday.  Asking why she is still having abdominal pain in the lower abdomen and not able to eat.  Reviewed CT scan on admission that shows gallstones without evidence of acute cholecystitis, nonspecific gas in the bladder consider cystitis, small ventral abdominal wall hernia.  Will get a right upper quadrant sonogram tomorrow morning.    I assumed  care on 10/25/23. RUQ U/S non-acute, cholelithiasis without acute cholecystitis.  Pt had dialysis.   Will trial tolerance for PO intake given persistent nausea reported this AM. Completing IV antibiotics.    Assessment and Plan:  * UTI due to extended-spectrum beta lactamase (ESBL) producing Escherichia coli On meropenem  (day 5).  I was considering a dose of fosfomycin if I discharged today but will continue meropenem  this evening.  Sepsis ruled out because she did not meet the SIRS criteria.  Abdominal pain Generalized.  Could be secondary to urinary tract infection.  RUQ ultrasound was non-acute.  Patient with poor appetite and nausea.  This could also be secondary to end-stage renal disease and/or diabetes. --Supportive care --Mgmt of UTI as outlined  Hypotension Resolved.  Restarted losartan  and increased Coreg  back to usual dose.  Tibial plateau fracture, right, sequela Outpatient orthopedic evaluation (Dr. Lorelle)  Chronic heart failure with preserved ejection fraction (HFpEF) (HCC) Continue Coreg  and losartan   ESRD (end stage renal disease) on dialysis [facility]) Dialysis yesterday  Hyponatremia Dialysis to manage  Myocardial injury Elevated troponin secondary to demand ischemia with hypotension.  No complaints of chest pain or shortness of breath.  On aspirin .  Hyperkalemia Improved  Type 2 diabetes mellitus with ESRD (end-stage renal disease) (HCC) On Lantus  insulin  and sliding scale       Subjective: Patient complains that she has not been able to eat since dialysis yesterday.  Feels nauseous.  Having some abdominal pain.  Admitted with multidrug-resistant urinary tract infection.  Physical Exam: Vitals:  10/25/23 1200 10/25/23 1229 10/25/23 1234 10/25/23 1245  BP: (!) 115/45 (!) 109/46 (!) 125/51   Pulse: 72 67 75   Resp: (!) 31 16 19    Temp:   98.1 F (36.7 C)   TempSrc:      SpO2: 100% 100% 100%   Weight:    86.4 kg  Height:       General exam:  awake, alert, no acute distress, obese HEENT: moist mucus membranes, hearing grossly normal  Respiratory system: CTAB, no wheezes, rales or rhonchi, normal respiratory effort. On 2 L/min [place] O2 Cardiovascular system: normal S1/S2, RRR, HD cath accessed for dialysis Gastrointestinal system: soft, mild lower mid abdominal tenderness without rebound or guarding Central nervous system: A&O x 3. no gross focal neurologic deficits, normal speech Skin: dry, intact, normal temperature Psychiatry: normal mood, congruent affect, judgement and insight appear normal    Data Reviewed:  Notable labs --  Na 134 Cl 95 Glucose 188 BUN 38 Cr 3.43 Ca 8.8 Gap 16 Hbg 9.2 stable  RUQ U/S this AM -- cholelithiasis without signs of acute cholecystitis. Hepatic steatosis.  No acute findings.  Family Communication: Dr. Josette previously spoke with son.  9/3 none present on rounds - Will attempt to call as time allows.  Disposition: Status is: Inpatient Remains inpatient appropriate because:  Continue IV meropenem  to complete 5 days this evening.  Can consider fosfomycin.  Ongoing nausea - need to ensure pt tolerating PO intake after dialysis today, otherwise high risk of close readmission.   Planned Discharge Destination: Rehab    Time spent: 38 minutes  Author: Burnard DELENA Cunning, DO 10/25/2023 3:20 PM  For on call review [email or web address].

## 2023-10-26 DIAGNOSIS — B9629 Other Escherichia coli [E. coli] as the cause of diseases classified elsewhere: Secondary | ICD-10-CM | POA: Diagnosis not present

## 2023-10-26 DIAGNOSIS — N390 Urinary tract infection, site not specified: Secondary | ICD-10-CM | POA: Diagnosis not present

## 2023-10-26 DIAGNOSIS — Z1612 Extended spectrum beta lactamase (ESBL) resistance: Secondary | ICD-10-CM | POA: Diagnosis not present

## 2023-10-26 LAB — GLUCOSE, CAPILLARY
Glucose-Capillary: 252 mg/dL — ABNORMAL HIGH (ref 70–99)
Glucose-Capillary: 228 mg/dL — ABNORMAL HIGH (ref 70–99)

## 2023-10-26 MED ORDER — POLYETHYLENE GLYCOL 3350 17 G PO PACK: 17.0000 g | PACK | Freq: Two times a day (BID) | ORAL | Status: AC

## 2023-10-26 MED ORDER — LOSARTAN POTASSIUM 50 MG PO TABS: 50.0000 mg | ORAL_TABLET | Freq: Every evening | ORAL | Status: AC

## 2023-10-26 MED ORDER — ONDANSETRON HCL 4 MG PO TABS: 4.0000 mg | ORAL_TABLET | Freq: Three times a day (TID) | ORAL | Status: AC | PRN

## 2023-10-26 MED ORDER — LINACLOTIDE 145 MCG PO CAPS: 145.0000 ug | ORAL_CAPSULE | Freq: Every evening | ORAL | Status: AC

## 2023-10-26 NOTE — Discharge Summary (Signed)
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  Status: Abnormal   Collection Time: 10/21/23  8:31 AM   Specimen: Nasal Mucosa; Nasal Swab  Result Value Ref Range Status   MRSA by PCR Next Gen DETECTED (A) NOT DETECTED Final    Comment: RESULT CALLED TO, READ BACK BY AND VERIFIED WITH: Michail Jubilee RN 10/21/23 1038 KG (NOTE) The GeneXpert MRSA Assay (FDA approved for NASAL specimens only), is one component of a comprehensive MRSA colonization surveillance program. It is not intended to diagnose MRSA infection nor to guide or monitor treatment for MRSA infections. Test performance is not FDA approved in patients less than 5 years old. Performed at [facility], [address] [place], [place] [address]     Labs: CBC: Recent Labs  Lab 10/20/23 1248 10/21/23 0454 10/22/23 0503 10/23/23 0414 10/25/23 0455  WBC 7.4 7.5  --  5.4 5.8  HGB 7.8* 8.0* 8.2* 8.0* 9.2*  HCT 25.4* 25.1*  --  24.9* 28.6*   MCV 96.2 94.7  --  93.3 94.1  PLT 174 154  --  188 204   Basic Metabolic Panel: Recent Labs  Lab 10/20/23 1248 10/21/23 0454 10/23/23 0414 10/25/23 0455  NA 131* 133* 131* 134*  K 5.2* 5.3* 4.4 4.8  CL 93* 95* 93* 95*  CO2 25 25 26 23   GLUCOSE 180* 145* 191* 188*  BUN 54* 61* 46* 38*  CREATININE 3.65* 4.37* 3.53* 3.43*  CALCIUM  8.5* 8.4* 8.3* 8.8*  PHOS  --  4.8*  --   --    Liver Function Tests: Recent Labs  Lab 10/20/23 1248  AST 20  ALT 11  ALKPHOS 171*  BILITOT 1.5*  PROT 6.3*  ALBUMIN  2.7*   CBG: Recent Labs  Lab 10/24/23 2154 10/25/23 1310 10/25/23 1643 10/25/23 2147 10/26/23 0824  GLUCAP 262* 148* 264* 257* 252*    Discharge time spent: greater than 30 minutes.  Signed: Burnard DELENA Cunning, DO [facility] 10/26/2023

## 2023-10-26 NOTE — Progress Notes (Signed)
 [facility]  ROUNDING NOTE   Subjective:   Katrina Ortiz  is a 68 y.o. female with past medical history of diabetes, hypertension, CAD, GERD, and end stage renal disease on HD. Patient presents to ED with nausea and vomiting and leg pain. She was admitted overnight for Abnormal EKG [R94.31] Intra-dialytic hypotension [I95.3] Elevated troponin [R79.89] ESBL (extended spectrum beta-lactamase) producing bacteria infection [A49.9, Z16.12] Sepsis (HCC) [A41.9] UTI due to extended-spectrum beta lactamase (ESBL) producing Escherichia coli [N39.0, B96.29, Z16.12]  Patient is known to our practice from previous admission and is a long term resident at [facility], where she receives her dialysis.    Update:  Patient sitting up in bed Alert States she feels well today    Objective:  Vital signs in last 24 hours:  Temp:  [97.8 F (36.6 C)-98.7 F (37.1 C)] 97.8 F (36.6 C) (09/04 0335) Pulse Rate:  [64-75] 64 (09/04 0335) Resp:  [16-31] 18 (09/04 0335) BP: (109-149)/(45-58) 138/57 (09/04 0335) SpO2:  [98 %-100 %] 100 % (09/04 0335) Weight:  [86.4 kg] 86.4 kg (09/03 1245)  Weight change:  Filed Weights   10/23/23 0841 10/23/23 1245 10/25/23 1245  Weight: 88.2 kg 86.2 kg 86.4 kg    Intake/Output: I/O last 3 completed shifts: In: -  Out: 2200 [Urine:200; Other:2000]   Intake/Output this shift:  Total I/O In: 240 [P.O.:240] Out: -   Physical Exam: General: NAD  Head: Normocephalic, atraumatic. Moist oral mucosal membranes  Eyes: Anicteric  Lungs:  Clear to auscultation, [place] O2   Heart: Regular rate and rhythm  Abdomen:  Soft, nontender  Extremities:  No peripheral edema.  Neurologic: Awake, alert, conversant  Skin: Warm,dry, no rash  Access: Rt AVF (not functioning) Rt internal jugular permcath    Basic Metabolic Panel: Recent Labs  Lab 10/20/23 1248 10/21/23 0454 10/23/23 0414 10/25/23 0455  NA 131* 133* 131* 134*  K 5.2* 5.3* 4.4 4.8  CL 93* 95*  93* 95*  CO2 25 25 26 23   GLUCOSE 180* 145* 191* 188*  BUN 54* 61* 46* 38*  CREATININE 3.65* 4.37* 3.53* 3.43*  CALCIUM  8.5* 8.4* 8.3* 8.8*  PHOS  --  4.8*  --   --     Liver Function Tests: Recent Labs  Lab 10/20/23 1248  AST 20  ALT 11  ALKPHOS 171*  BILITOT 1.5*  PROT 6.3*  ALBUMIN  2.7*   Recent Labs  Lab 10/20/23 1248  LIPASE 20   No results for input(s): AMMONIA in the last 168 hours.  CBC: Recent Labs  Lab 10/20/23 1248 10/21/23 0454 10/22/23 0503 10/23/23 0414 10/25/23 0455  WBC 7.4 7.5  --  5.4 5.8  HGB 7.8* 8.0* 8.2* 8.0* 9.2*  HCT 25.4* 25.1*  --  24.9* 28.6*  MCV 96.2 94.7  --  93.3 94.1  PLT 174 154  --  188 204    Cardiac Enzymes: No results for input(s): CKTOTAL, CKMB, CKMBINDEX, TROPONINI in the last 168 hours.  BNP: Invalid input(s): POCBNP  CBG: Recent Labs  Lab 10/24/23 2154 10/25/23 1310 10/25/23 1643 10/25/23 2147 10/26/23 0824  GLUCAP 262* 148* 264* 257* 252*    Microbiology: Results for orders placed or performed during the hospital encounter of 10/20/23  Blood Culture (routine x 2)     Status: None   Collection Time: 10/20/23 12:48 PM   Specimen: BLOOD  Result Value Ref Range Status   Specimen Description   Final    BLOOD RIGHT ANTECUBITAL Performed at [facility],  [address] [place], [place] [address]    Special Requests   Final    BOTTLES DRAWN AEROBIC AND ANAEROBIC Blood Culture results may not be optimal due to an inadequate volume of blood received in culture bottles Performed at [facility], [address], [place], [place] [address]    Culture   Final    NO GROWTH 5 DAYS Performed at [facility], [address], [place], [place] [address]    Report Status 10/25/2023 FINAL  Final  Blood Culture (routine x 2)     Status: None   Collection Time: 10/20/23  4:32 PM   Specimen: BLOOD  Result Value Ref Range Status   Specimen Description   Final    BLOOD BLOOD RIGHT  ARM Performed at [facility], [address], [place], [place] [address]    Special Requests   Final    BOTTLES DRAWN AEROBIC AND ANAEROBIC Blood Culture results may not be optimal due to an inadequate volume of blood received in culture bottles Performed at [facility], [address], [place], [place] [address]    Culture   Final    NO GROWTH 5 DAYS Performed at [facility], [address], [place], [place] [address]    Report Status 10/25/2023 FINAL  Final  MRSA Next Gen by PCR, Nasal     Status: Abnormal   Collection Time: 10/21/23  8:31 AM   Specimen: Nasal Mucosa; Nasal Swab  Result Value Ref Range Status   MRSA by PCR Next Gen DETECTED (A) NOT DETECTED Final    Comment: RESULT CALLED TO, READ BACK BY AND VERIFIED WITH: Michail Jubilee RN 10/21/23 1038 KG (NOTE) The GeneXpert MRSA Assay (FDA approved for NASAL specimens only), is one component of a comprehensive MRSA colonization surveillance program. It is not intended to diagnose MRSA infection nor to guide or monitor treatment for MRSA infections. Test performance is not FDA approved in patients less than 29 years old. Performed at [facility], [address] [place] [place], [place] [address]     Coagulation Studies: No results for input(s): LABPROT, INR in the last 72 hours.  Urinalysis: No results for input(s): COLORURINE, LABSPEC, PHURINE, GLUCOSEU, HGBUR, BILIRUBINUR, KETONESUR, PROTEINUR, UROBILINOGEN, NITRITE, LEUKOCYTESUR in the last 72 hours.  Invalid input(s): APPERANCEUR      Imaging: US  Abdomen Limited RUQ (LIVER/GB) Result Date: 10/25/2023 CLINICAL DATA:  Abdominal pain. EXAM: ULTRASOUND ABDOMEN LIMITED RIGHT UPPER QUADRANT COMPARISON:  CT abdomen pelvis 10/17/2023 and right upper quadrant ultrasound 04/11/2021. FINDINGS: Gallbladder: Shadowing echogenic stone measures 5 mm. No wall thickening or sonographic Murphy sign. No pericholecystic fluid.  Common bile duct: Diameter: 3 mm, within normal limits. No intrahepatic biliary ductal dilatation. Liver: Diffusely increased in echogenicity. No definite focal lesion. Portal vein is patent on color Doppler imaging with normal direction of blood flow towards the liver. Other: None. IMPRESSION: 1. No acute findings. 2. Cholelithiasis. 3. Hepatic steatosis. Electronically Signed   By: Newell Eke M.D.   On: 10/25/2023 08:35      Medications:      aspirin  EC  81 mg Oral Daily   carvedilol   6.25 mg Oral BID WC   Chlorhexidine  Gluconate Cloth  6 each Topical Q0600   diclofenac  Sodium  4 g Topical TID   epoetin  alfa-epbx (RETACRIT ) injection  4,000 Units Intravenous Q M,W,F-1800   ezetimibe   10 mg Oral Daily   fluticasone   1 spray Each Nare BID   gabapentin   100 mg Oral BID   guaiFENesin   600 mg Oral BID   heparin  injection (subcutaneous)  5,000 Units Subcutaneous Q12H   insulin  aspart  0-6 Units Subcutaneous TID WC   linaclotide   145 mcg Oral Daily   loratadine   10 mg Oral Daily   losartan   50 mg Oral QPM   methocarbamol   1,000 mg Oral TID   montelukast   10 mg Oral Daily   pantoprazole   40 mg Oral BID   polyethylene glycol  17 g Oral BID   acetaminophen , artificial tears, hydrALAZINE , lactulose , ondansetron   Assessment/ Plan:  [name] is a 68 y.o.  female  with past medical history of diabetes, hypertension, CAD, GERD, and end stage renal disease on HD.   [facility]  End stage renal disease on hemodialysis.  Received dialysis yesterday, UF 2L achieved.  Next treatment scheduled for Friday. Patient cleared to discharge from renal stance, will defer to primary team.   2. UTI with positive UA.  Failed outpatient antibiotic regimen.  Completed meropenem , managed by primary team.   3. Anemia of chronic kidney disease Lab Results  Component Value Date   HGB 9.2 (L) 10/25/2023    Does receive Mircera at [facility].  Continue IV Retacrit  with dialysis.  4. Secondary  Hyperparathyroidism: with outpatient labs: PTH 301, phosphorus 5.4, calcium  8.8 on 09/26/23.   Lab Results  Component Value Date   CALCIUM  8.8 (L) 10/25/2023   PHOS 4.8 (H) 10/21/2023    Patient prescribed sevelamer with meals.  Calcium  and phosphorus within desired range for renal patient.   LOS: 6 Richanda Darin 9/4/202511:24 AM

## 2023-10-26 NOTE — TOC Transition Note (Signed)
 Transition of Care [facility]) - Discharge Note   Patient Details  Name: Katrina Ortiz MRN: [id number] Date of Birth: June 19, 1955  [facility] CM/SW Contact:  Lauraine [name], LCSW Phone Number: 10/26/2023, 12:43 PM   Clinical Narrative:  Patient has orders to discharge back to [facility] today. RN will call report to [phone] (Room F-15). [facility] has been arranged and she is 4th on the list. No further concerns. CSW signing off.   Final next level of care: Skilled Nursing Facility Barriers to Discharge: Barriers Resolved   Patient Goals and CMS Choice     Choice offered to / list presented to : NA      Discharge Placement   Existing PASRR number confirmed : 10/24/23          Patient chooses bed at: Other - please specify in the comment section below: ([facility]) Patient to be transferred to facility by: [facility] Name of family member notified: Erikah Thumm Patient and family notified of of transfer: 10/26/23  Discharge Plan and Services Additional resources added to the After Visit Summary for       Post Acute Care Choice: Resumption of Svcs/PTA Provider                               Social Drivers of Health (SDOH) Interventions SDOH Screenings   Food Insecurity: No Food Insecurity (10/21/2023)  Housing: High Risk (10/21/2023)  Transportation Needs: No Transportation Needs (10/21/2023)  Utilities: Not At Risk (10/21/2023)  Financial Resource Strain: Low Risk  (07/06/2023)   Received from [facility]  Physical Activity: Inactive (07/08/2020)   Received from [facility]  Social Connections: Unknown (10/21/2023)  Stress: No Stress Concern Present (07/08/2020)   Received from [facility]  Tobacco Use: Medium Risk (10/21/2023)     Readmission Risk Interventions    04/16/2021   10:42 AM  Readmission Risk Prevention Plan  Transportation Screening Complete  PCP or Specialist Appt  within 3-5 Days Complete  Social Work Consult for Recovery Care Planning/Counseling Complete  Palliative Care Screening Not Applicable  Medication Review Oceanographer) Complete

## 2024-02-08 ENCOUNTER — Inpatient Hospital Stay
Admission: EM | Admit: 2024-02-08 | Discharge: 2024-02-16 | DRG: 193 | Disposition: A | Discharge status: Skilled Nursing Facility | Source: Skilled Nursing Facility | Attending: Internal Medicine | Admitting: Internal Medicine

## 2024-02-08 ENCOUNTER — Emergency Department

## 2024-02-08 ENCOUNTER — Encounter: Payer: Self-pay | Admitting: Emergency Medicine

## 2024-02-08 ENCOUNTER — Other Ambulatory Visit: Payer: Self-pay

## 2024-02-08 DIAGNOSIS — I10 Essential (primary) hypertension: Secondary | ICD-10-CM | POA: Diagnosis present

## 2024-02-08 DIAGNOSIS — E669 Obesity, unspecified: Secondary | ICD-10-CM | POA: Diagnosis present

## 2024-02-08 DIAGNOSIS — Z794 Long term (current) use of insulin: Secondary | ICD-10-CM

## 2024-02-08 DIAGNOSIS — J440 Chronic obstructive pulmonary disease with acute lower respiratory infection: Secondary | ICD-10-CM | POA: Diagnosis present

## 2024-02-08 DIAGNOSIS — E11649 Type 2 diabetes mellitus with hypoglycemia without coma: Secondary | ICD-10-CM | POA: Diagnosis not present

## 2024-02-08 DIAGNOSIS — J111 Influenza due to unidentified influenza virus with other respiratory manifestations: Secondary | ICD-10-CM

## 2024-02-08 DIAGNOSIS — Z7982 Long term (current) use of aspirin: Secondary | ICD-10-CM

## 2024-02-08 DIAGNOSIS — R0600 Dyspnea, unspecified: Secondary | ICD-10-CM | POA: Diagnosis present

## 2024-02-08 DIAGNOSIS — I2510 Atherosclerotic heart disease of native coronary artery without angina pectoris: Secondary | ICD-10-CM | POA: Diagnosis present

## 2024-02-08 DIAGNOSIS — K219 Gastro-esophageal reflux disease without esophagitis: Secondary | ICD-10-CM | POA: Diagnosis present

## 2024-02-08 DIAGNOSIS — Z91014 Allergy to mammalian meats: Secondary | ICD-10-CM

## 2024-02-08 DIAGNOSIS — T380X5A Adverse effect of glucocorticoids and synthetic analogues, initial encounter: Secondary | ICD-10-CM | POA: Diagnosis present

## 2024-02-08 DIAGNOSIS — N186 End stage renal disease: Secondary | ICD-10-CM | POA: Diagnosis present

## 2024-02-08 DIAGNOSIS — L8990 Pressure ulcer of unspecified site, unspecified stage: Secondary | ICD-10-CM | POA: Insufficient documentation

## 2024-02-08 DIAGNOSIS — I5032 Chronic diastolic (congestive) heart failure: Secondary | ICD-10-CM | POA: Diagnosis present

## 2024-02-08 DIAGNOSIS — R55 Syncope and collapse: Secondary | ICD-10-CM | POA: Diagnosis not present

## 2024-02-08 DIAGNOSIS — Z6835 Body mass index (BMI) 35.0-35.9, adult: Secondary | ICD-10-CM

## 2024-02-08 DIAGNOSIS — E1165 Type 2 diabetes mellitus with hyperglycemia: Secondary | ICD-10-CM | POA: Diagnosis present

## 2024-02-08 DIAGNOSIS — D631 Anemia in chronic kidney disease: Secondary | ICD-10-CM | POA: Diagnosis present

## 2024-02-08 DIAGNOSIS — Z955 Presence of coronary angioplasty implant and graft: Secondary | ICD-10-CM

## 2024-02-08 DIAGNOSIS — K5900 Constipation, unspecified: Secondary | ICD-10-CM | POA: Diagnosis not present

## 2024-02-08 DIAGNOSIS — Z66 Do not resuscitate: Secondary | ICD-10-CM | POA: Diagnosis present

## 2024-02-08 DIAGNOSIS — Z79899 Other long term (current) drug therapy: Secondary | ICD-10-CM

## 2024-02-08 DIAGNOSIS — Z951 Presence of aortocoronary bypass graft: Secondary | ICD-10-CM

## 2024-02-08 DIAGNOSIS — E871 Hypo-osmolality and hyponatremia: Secondary | ICD-10-CM | POA: Diagnosis present

## 2024-02-08 DIAGNOSIS — J441 Chronic obstructive pulmonary disease with (acute) exacerbation: Principal | ICD-10-CM | POA: Diagnosis present

## 2024-02-08 DIAGNOSIS — Z87891 Personal history of nicotine dependence: Secondary | ICD-10-CM

## 2024-02-08 DIAGNOSIS — I959 Hypotension, unspecified: Secondary | ICD-10-CM | POA: Diagnosis not present

## 2024-02-08 DIAGNOSIS — E875 Hyperkalemia: Secondary | ICD-10-CM | POA: Diagnosis present

## 2024-02-08 DIAGNOSIS — E785 Hyperlipidemia, unspecified: Secondary | ICD-10-CM | POA: Diagnosis present

## 2024-02-08 DIAGNOSIS — N2581 Secondary hyperparathyroidism of renal origin: Secondary | ICD-10-CM | POA: Diagnosis present

## 2024-02-08 DIAGNOSIS — E1122 Type 2 diabetes mellitus with diabetic chronic kidney disease: Secondary | ICD-10-CM | POA: Diagnosis present

## 2024-02-08 DIAGNOSIS — I132 Hypertensive heart and chronic kidney disease with heart failure and with stage 5 chronic kidney disease, or end stage renal disease: Secondary | ICD-10-CM | POA: Diagnosis present

## 2024-02-08 DIAGNOSIS — I2489 Other forms of acute ischemic heart disease: Secondary | ICD-10-CM | POA: Diagnosis present

## 2024-02-08 DIAGNOSIS — J101 Influenza due to other identified influenza virus with other respiratory manifestations: Secondary | ICD-10-CM | POA: Diagnosis not present

## 2024-02-08 DIAGNOSIS — I5033 Acute on chronic diastolic (congestive) heart failure: Secondary | ICD-10-CM | POA: Diagnosis present

## 2024-02-08 DIAGNOSIS — Z992 Dependence on renal dialysis: Secondary | ICD-10-CM

## 2024-02-08 DIAGNOSIS — Z8249 Family history of ischemic heart disease and other diseases of the circulatory system: Secondary | ICD-10-CM

## 2024-02-08 DIAGNOSIS — Z7401 Bed confinement status: Secondary | ICD-10-CM

## 2024-02-08 DIAGNOSIS — Z22322 Carrier or suspected carrier of Methicillin resistant Staphylococcus aureus: Secondary | ICD-10-CM

## 2024-02-08 DIAGNOSIS — Z713 Dietary counseling and surveillance: Secondary | ICD-10-CM

## 2024-02-08 LAB — ALBUMIN: Albumin: 3.5 g/dL (ref 3.5–5.0)

## 2024-02-08 LAB — BASIC METABOLIC PANEL WITH GFR
Anion gap: 20 — ABNORMAL HIGH (ref 5–15)
BUN: 64 mg/dL — ABNORMAL HIGH (ref 8–23)
CO2: 20 mmol/L — ABNORMAL LOW (ref 22–32)
Calcium: 6.5 mg/dL — ABNORMAL LOW (ref 8.9–10.3)
Chloride: 91 mmol/L — ABNORMAL LOW (ref 98–111)
Creatinine, Ser: 4.67 mg/dL — ABNORMAL HIGH (ref 0.44–1.00)
GFR, Estimated: 10 mL/min — ABNORMAL LOW (ref >60)
Glucose, Bld: 125 mg/dL — ABNORMAL HIGH (ref 70–99)
Potassium: 4.4 mmol/L (ref 3.5–5.1)
Sodium: 131 mmol/L — ABNORMAL LOW (ref 135–145)

## 2024-02-08 LAB — RESP PANEL BY RT-PCR (RSV, FLU A&B, COVID)  RVPGX2
Influenza A by PCR: POSITIVE — AB
Influenza B by PCR: NEGATIVE
Resp Syncytial Virus by PCR: NEGATIVE
SARS Coronavirus 2 by RT PCR: NEGATIVE

## 2024-02-08 LAB — CBC
HCT: 34.4 % — ABNORMAL LOW (ref 36.0–46.0)
Hemoglobin: 11.1 g/dL — ABNORMAL LOW (ref 12.0–15.0)
MCH: 29.4 pg (ref 26.0–34.0)
MCHC: 32.3 g/dL (ref 30.0–36.0)
MCV: 91.2 fL (ref 80.0–100.0)
Platelets: 133 K/uL — ABNORMAL LOW (ref 150–400)
RBC: 3.77 MIL/uL — ABNORMAL LOW (ref 3.87–5.11)
RDW: 13.9 % (ref 11.5–15.5)
WBC: 5.2 K/uL (ref 4.0–10.5)
nRBC: 0 % (ref 0.0–0.2)

## 2024-02-08 LAB — LIPID PANEL
Cholesterol: 153 mg/dL (ref 0–200)
HDL: 31 mg/dL — ABNORMAL LOW (ref >40)
LDL Cholesterol: 64 mg/dL (ref 0–99)
Total CHOL/HDL Ratio: 5 ratio
Triglycerides: 291 mg/dL — ABNORMAL HIGH (ref <150)
VLDL: 58 mg/dL — ABNORMAL HIGH (ref 0–40)

## 2024-02-08 LAB — PROTIME-INR
INR: 1 (ref 0.8–1.2)
Prothrombin Time: 13.7 s (ref 11.4–15.2)

## 2024-02-08 LAB — LACTIC ACID, PLASMA
Lactic Acid, Venous: 1 mmol/L (ref 0.5–1.9)
Lactic Acid, Venous: 1.1 mmol/L (ref 0.5–1.9)

## 2024-02-08 LAB — MAGNESIUM: Magnesium: 1.9 mg/dL (ref 1.7–2.4)

## 2024-02-08 LAB — PRO BRAIN NATRIURETIC PEPTIDE: Pro Brain Natriuretic Peptide: >35000 pg/mL — ABNORMAL HIGH (ref <300.0)

## 2024-02-08 LAB — PROCALCITONIN: Procalcitonin: 0.69 ng/mL

## 2024-02-08 LAB — MRSA NEXT GEN BY PCR, NASAL: MRSA by PCR Next Gen: DETECTED — AB

## 2024-02-08 MED ORDER — IPRATROPIUM-ALBUTEROL 0.5-2.5 (3) MG/3ML IN SOLN
3.0000 mL | Freq: Once | RESPIRATORY_TRACT | Status: AC
  Administered 2024-02-08: 09:00:00 3 mL via RESPIRATORY_TRACT
  Filled 2024-02-08: qty 3

## 2024-02-08 MED ORDER — DOXYCYCLINE HYCLATE 100 MG PO TABS
100.0000 mg | ORAL_TABLET | Freq: Two times a day (BID) | ORAL | Status: AC
  Administered 2024-02-08 – 2024-02-12 (×10): 100 mg via ORAL
  Filled 2024-02-08 (×10): qty 1

## 2024-02-08 MED ORDER — IPRATROPIUM-ALBUTEROL 0.5-2.5 (3) MG/3ML IN SOLN
3.0000 mL | Freq: Four times a day (QID) | RESPIRATORY_TRACT | Status: DC
  Administered 2024-02-08 (×2): 3 mL via RESPIRATORY_TRACT
  Filled 2024-02-08 (×2): qty 3

## 2024-02-08 MED ORDER — BENZONATATE 100 MG PO CAPS
100.0000 mg | ORAL_CAPSULE | Freq: Three times a day (TID) | ORAL | Status: DC
  Administered 2024-02-08 – 2024-02-16 (×25): 100 mg via ORAL
  Filled 2024-02-08 (×25): qty 1

## 2024-02-08 MED ORDER — ACETAMINOPHEN 650 MG RE SUPP: 650.0000 mg | Freq: Four times a day (QID) | RECTAL | Status: DC | PRN

## 2024-02-08 MED ORDER — ALBUTEROL SULFATE (2.5 MG/3ML) 0.083% IN NEBU
2.5000 mg | INHALATION_SOLUTION | Freq: Once | RESPIRATORY_TRACT | Status: DC
  Filled 2024-02-08: qty 3

## 2024-02-08 MED ORDER — CALCIUM GLUCONATE-NACL 1-0.675 GM/50ML-% IV SOLN
1.0000 g | Freq: Once | INTRAVENOUS | Status: AC
  Administered 2024-02-08: 21:00:00 1000 mg via INTRAVENOUS
  Filled 2024-02-08: qty 50

## 2024-02-08 MED ORDER — PREDNISONE 20 MG PO TABS
40.0000 mg | ORAL_TABLET | Freq: Every day | ORAL | Status: AC
  Administered 2024-02-09 – 2024-02-12 (×3): 40 mg via ORAL
  Filled 2024-02-08 (×4): qty 2

## 2024-02-08 MED ORDER — CARVEDILOL 6.25 MG PO TABS
6.2500 mg | ORAL_TABLET | Freq: Two times a day (BID) | ORAL | Status: DC
  Administered 2024-02-08 – 2024-02-16 (×15): 6.25 mg via ORAL
  Filled 2024-02-08 (×15): qty 1

## 2024-02-08 MED ORDER — LOSARTAN POTASSIUM 50 MG PO TABS
50.0000 mg | ORAL_TABLET | Freq: Every day | ORAL | Status: DC
  Administered 2024-02-08 – 2024-02-16 (×8): 50 mg via ORAL
  Filled 2024-02-08 (×8): qty 1

## 2024-02-08 MED ORDER — BUDESONIDE 0.5 MG/2ML IN SUSP
0.5000 mg | Freq: Two times a day (BID) | RESPIRATORY_TRACT | Status: DC
  Administered 2024-02-08 – 2024-02-10 (×5): 0.5 mg via RESPIRATORY_TRACT
  Filled 2024-02-08 (×5): qty 2

## 2024-02-08 MED ORDER — ACETAMINOPHEN 325 MG PO TABS
650.0000 mg | ORAL_TABLET | Freq: Four times a day (QID) | ORAL | Status: DC | PRN
  Administered 2024-02-08 – 2024-02-16 (×16): 650 mg via ORAL
  Filled 2024-02-08 (×14): qty 2

## 2024-02-08 MED ORDER — ONDANSETRON HCL 4 MG PO TABS
4.0000 mg | ORAL_TABLET | Freq: Four times a day (QID) | ORAL | Status: DC | PRN
  Administered 2024-02-14 (×2): 4 mg via ORAL
  Filled 2024-02-08 (×2): qty 1

## 2024-02-08 MED ORDER — ONDANSETRON HCL 4 MG/2ML IJ SOLN
4.0000 mg | Freq: Four times a day (QID) | INTRAMUSCULAR | Status: DC | PRN
  Administered 2024-02-11 – 2024-02-16 (×3): 4 mg via INTRAVENOUS
  Filled 2024-02-08: qty 2

## 2024-02-08 MED ORDER — LABETALOL HCL 5 MG/ML IV SOLN
5.0000 mg | INTRAVENOUS | Status: DC | PRN
  Administered 2024-02-08 – 2024-02-13 (×6): 5 mg via INTRAVENOUS
  Filled 2024-02-08 (×7): qty 4

## 2024-02-08 MED ORDER — LABETALOL HCL 5 MG/ML IV SOLN
5.0000 mg | INTRAVENOUS | Status: DC | PRN
  Administered 2024-02-08: 17:00:00 5 mg via INTRAVENOUS
  Filled 2024-02-08: qty 4

## 2024-02-08 MED ORDER — SODIUM CHLORIDE 0.9% FLUSH
3.0000 mL | Freq: Two times a day (BID) | INTRAVENOUS | Status: DC
  Administered 2024-02-08 – 2024-02-16 (×15): 3 mL via INTRAVENOUS

## 2024-02-08 MED ORDER — SENNOSIDES-DOCUSATE SODIUM 8.6-50 MG PO TABS: 1.0000 | ORAL_TABLET | Freq: Every evening | ORAL | Status: DC | PRN

## 2024-02-08 MED ADMIN — Carbamide Peroxide 6.5% Otic Soln: 5 [drp] | OTIC | @ 21:00:00 | NDC 00904662735

## 2024-02-08 MED FILL — Carbamide Peroxide 6.5% Otic Soln: 5.0000 [drp] | OTIC | Qty: 15 | Status: AC

## 2024-02-08 NOTE — ED Triage Notes (Signed)
 Patient to ER via EMS from [facility] for C/O increasing dyspnea and cough; and general malaise after testing Flu+ 4 days ago being treated with Tamiflu - Patient states she is a dialysis patient for Monday, Tuesday, Wednesday; and Friday. Patient given Duo nebs x 2; Solumedrol 125 mg; and Zofran  4 mg by EMS

## 2024-02-08 NOTE — Plan of Care (Signed)

## 2024-02-08 NOTE — H&P (Signed)
 History and Physical    Katrina Ortiz FMW:[id number] DOB: 18-May-1955 DOA: 02/08/2024  DOS: the patient was seen and examined on 02/08/2024  PCP: Relda Heron LABOR, MD   Patient coming from: SNF  I have personally briefly reviewed patient[facility]  HPI:   Katrina Ortiz is a 68 y.o. year old female with medical history of hypertension, hyperlipidemia, type 2 diabetes, ESRD on HD Monday, Tuesday, Wednesday, Friday presenting to the ED with worsening shortness of breath.  Patient reports not feeling well about since 4 days ago with URI symptoms and her facility tested her and she was positive for flu.  She was started on Tamiflu on 12/15 but her symptoms have persisted.  Given her continued symptoms, she requested to go to the ED for further evaluation. She reports diffuse muscle pains, decreased appetite and malaise. Does not report fevers but states she has chills.    On arrival to the ED patient was noted to be HDS stable.  Lab work and imaging obtained.  CBC without leukocytosis, anemia at baseline, slight thrombocytopenia at 133.  BMP with mild hyponatremia at 131, chronic ESRD changes, significant hypocalcemia at 6.5.  Respiratory panel positive for influenza A.  proBNP checked and greater than 35,000.  Procalcitonin slightly elevated at 0.69.  Lactic acid normal.  Blood cultures obtained.  Chest x-ray shows vascular congestion but no opacity.  Patient given treatment with DuoNeb but given continued respiratory distress, TR[facility] contacted for admission.  Review of Systems: As mentioned in the history of present illness. All other systems reviewed and are negative.   Past Medical History:  Diagnosis Date   Anemia    CHF (congestive heart failure) (HCC)    Chronic kidney disease    Coronary artery disease    Diabetes mellitus without complication (HCC)    DISH (diffuse idiopathic skeletal hyperostosis)    GERD (gastroesophageal  reflux disease)    Hyperkalemia    Hypertension    Psoriasis    Psoriasis     Past Surgical History:  Procedure Laterality Date   CESAREAN SECTION     2 c-sections   CORONARY ARTERY BYPASS GRAFT     CORONARY STENT PLACEMENT     HERNIA REPAIR     TUBAL LIGATION       Allergies[1]  Family History  Problem Relation Age of Onset   Kidney cancer Mother    Heart attack Father     Prior to Admission medications  Medication Sig Start Date End Date Taking? Authorizing Provider  acetaminophen  (TYLENOL ) 325 MG tablet Take 2 tablets (650 mg total) by mouth every 6 (six) hours as needed for mild pain (pain score 1-3). Patient taking differently: Take 1,000 mg by mouth in the morning, at noon, and at bedtime. 10/18/23   Wouk, Devaughn Sayres, MD  artificial tears ophthalmic solution 1 drop at bedtime.    [provider]  aspirin  EC 81 MG tablet Take 1 tablet by mouth daily. 07/24/22   [provider]  bumetanide  (BUMEX ) 1 MG tablet Take 4 mg by mouth 2 (two) times daily. 07/11/23   [provider]  Carboxymethylcellulose Sodium 1 % GEL Apply 1 drop to eye at bedtime.    [provider]  carvedilol  (COREG ) 6.25 MG tablet Take 6.25 mg by mouth 2 (two) times daily.    [provider]  cetirizine (ZYRTEC) 10 MG tablet Take 5 mg by mouth daily.    [provider]  cholecalciferol  (VITAMIN D3) 25 MCG (1000 UNIT) tablet Take 1,000 Units by mouth daily.    [provider]  Cranberry 450 MG TABS Take 1 tablet by mouth daily.    [provider]  diclofenac  Sodium (VOLTAREN ) 1 % GEL Apply 4 g topically 3 (three) times daily.    [provider]  ezetimibe  (ZETIA ) 10 MG tablet Take 10 mg by mouth daily.    [provider]  ferrous sulfate  325 (65 FE) MG EC tablet Take 325 mg by mouth every other day.    [provider]  fluticasone  (FLONASE ) 50 MCG/ACT nasal spray Place 1 spray into both nostrils 2 (two) times  daily.    [provider]  gabapentin  (NEURONTIN ) 100 MG capsule Take 100 mg by mouth 2 (two) times daily.    [provider]  guaiFENesin  (MUCINEX ) 600 MG 12 hr tablet Take 600 mg by mouth 2 (two) times daily.    [provider]  insulin  aspart (NOVOLOG ) 100 UNIT/ML injection Inject into the skin. Give before meals and at bedtime per sliding scale. BS < 80 call MD, BS 201 to 250 give 2 units, BS 251 to 300 give 4 units, BS 301 to 350 give 6 units, BS 351 to 400 give 8 units, BS 401 to 450 give 10 units, BS 451 to 500 give 12 units, BS > 500 give 14 units and call MD    [provider]  lactulose  (CHRONULAC ) 10 GM/15ML solution Take 30 g by mouth daily as needed. 10/12/23   [provider]  LANTUS  SOLOSTAR 100 UNIT/ML Solostar Pen Inject 10 Units into the skin at bedtime. Hold if sugar < 100 Patient taking differently: Inject 15 Units into the skin at bedtime. Hold if sugar < 100 04/11/21   Maree Hue, MD  latanoprost  (XALATAN ) 0.005 % ophthalmic solution Place 1 drop into both eyes at bedtime.    [provider]  linaclotide  (LINZESS ) 145 MCG CAPS capsule Take 1 capsule (145 mcg total) by mouth every evening. Dinner timem. 10/26/23   Fausto Sor A, DO  losartan  (COZAAR ) 50 MG tablet Take 1 tablet (50 mg total) by mouth every evening. 10/26/23 11/25/23  Fausto Sor A, DO  methocarbamol  (ROBAXIN ) 500 MG tablet Take 1,000 mg by mouth 3 (three) times daily. 10/12/23   [provider]  montelukast  (SINGULAIR ) 10 MG tablet Take 10 mg by mouth daily.    [provider]  Multiple Vitamins-Minerals (DECUBI-VITE) CAPS Take 1 capsule by mouth every morning.    [provider]  nitroGLYCERIN  (NITROSTAT ) 0.4 MG SL tablet Place 0.4 mg under the tongue every 5 (five) minutes as needed for chest pain.    [provider]  nystatin powder Apply 1 Application topically 2 (two) times daily.    [provider]  omeprazole  (PRILOSEC) 40 MG capsule Take 40 mg by mouth 2 (two) times daily.    [provider]  ondansetron  (ZOFRAN ) 4 MG tablet Take 1 tablet (4 mg total) by mouth every 8 (eight) hours as needed for nausea or vomiting. 10/26/23   Fausto Sor LABOR, DO  polyethylene glycol (MIRALAX  / GLYCOLAX ) 17 g packet Take 17 g by mouth 2 (two) times daily. Morning and at dinner time. 10/26/23   Fausto Sor A, DO  senna-docusate (SENOKOT-S) 8.6-50 MG tablet Take 2 tablets by mouth at bedtime. 03/28/23   [provider]  sevelamer carbonate (RENVELA) 0.8 g PACK packet Take 1 packet by mouth 3 (three)  times daily with meals. 06/21/23 06/20/24  [provider]  simethicone  (MYLICON) 80 MG chewable tablet Chew 80 mg by mouth every 6 (six) hours as needed for flatulence.    [provider]  zinc  oxide 20 % ointment Apply 1 Application topically as needed for irritation.    [provider]    Social History:  reports that she quit smoking about 21 years ago. Her smoking use included cigarettes. She has never used smokeless tobacco. She reports that she does not drink alcohol  and does not use drugs. Pt lives at [facility]. Pt is not smoking or drinking. Dependant in ADLs and iADLs. Pt is bed-bound.  Physical Exam: Vitals:   02/08/24 0840 02/08/24 0900 02/08/24 0930 02/08/24 1005  BP: 91/63 (!) 161/131 (!) 161/69 (!) 170/56  Pulse: 62 63 63 63  Resp:  13 (!) 21   Temp:      TempSrc:      SpO2: 96% 100% 100% 100%  Weight:      Height:        Gen: chronically ill appearing female appearing older than stated age.  HENT: NCAT CV: normal heart sounds Lung: diffuse rhonchi present Abd: No TTP, normal bowel sounds MSK: No asymmetry, good bulk and tone Neuro: alert and oriented   Labs on Admission: I have personally reviewed following labs and imaging studies  CBC: Recent Labs  Lab 02/08/24 0828  WBC 5.2  HGB 11.1*  HCT 34.4*  MCV 91.2  PLT 133*   Basic Metabolic  Panel: Recent Labs  Lab 02/08/24 0828  NA 131*  K 4.4  CL 91*  CO2 20*  GLUCOSE 125*  BUN 64*  CREATININE 4.67*  CALCIUM  6.5*   GFR: Estimated Creatinine Clearance: 12.2 mL/min (A) (by C-G formula based on SCr of 4.67 mg/dL (H)). Liver Function Tests: No results for input(s): AST, ALT, ALKPHOS, BILITOT, PROT, ALBUMIN  in the last 168 hours. No results for input(s): LIPASE, AMYLASE in the last 168 hours. No results for input(s): AMMONIA in the last 168 hours. Coagulation Profile: Recent Labs  Lab 02/08/24 0828  INR 1.0   Cardiac Enzymes: No results for input(s): CKTOTAL, CKMB, CKMBINDEX, TROPONINI, TROPONINIHS in the last 168 hours. BNP (last 3 results) No results for input(s): BNP in the last 8760 hours. HbA1C: No results for input(s): HGBA1C in the last 72 hours. CBG: No results for input(s): GLUCAP in the last 168 hours. Lipid Profile: No results for input(s): CHOL, HDL, LDLCALC, TRIG, CHOLHDL, LDLDIRECT in the last 72 hours. Thyroid Function Tests: No results for input(s): TSH, T4TOTAL, FREET4, T3FREE, THYROIDAB in the last 72 hours. Anemia Panel: No results for input(s): VITAMINB12, FOLATE, FERRITIN, TIBC, IRON, RETICCTPCT in the last 72 hours. Urine analysis:    Component Value Date/Time   COLORURINE YELLOW (A) 10/21/2023 1812   APPEARANCEUR TURBID (A) 10/21/2023 1812   LABSPEC 1.009 10/21/2023 1812   PHURINE 7.0 10/21/2023 1812   GLUCOSEU 50 (A) 10/21/2023 1812   HGBUR SMALL (A) 10/21/2023 1812   BILIRUBINUR NEGATIVE 10/21/2023 1812   KETONESUR 5 (A) 10/21/2023 1812   PROTEINUR >=300 (A) 10/21/2023 1812   NITRITE NEGATIVE 10/21/2023 1812   LEUKOCYTESUR LARGE (A) 10/21/2023 1812    Radiological Exams on Admission: I have personally reviewed images DG Chest 2 View Result Date: 02/08/2024 CLINICAL DATA:  Dyspnea. EXAM: CHEST - 2 VIEW COMPARISON:  10/20/2023 FINDINGS: Sternotomy wires and  right IJ dialysis catheter unchanged. Lungs are adequately inflated without lobar consolidation. No significant effusion. Possible  mild vascular congestion. Mild stable cardiomegaly. Remainder of the exam is unchanged. IMPRESSION: Mild stable cardiomegaly with possible mild vascular congestion. Electronically Signed   By: Toribio Agreste M.D.   On: 02/08/2024 08:50    EKG: My personal interpretation of EKG shows: sinus rhythm without any acute ST changes.    Assessment/Plan Principal Problem:   Influenza A Active Problems:   Essential hypertension   HLD (hyperlipidemia)   Chronic heart failure with preserved ejection fraction (HFpEF) (HCC)   Anemia in ESRD (end-stage renal disease) (HCC)   ESRD (end stage renal disease) on dialysis (HCC)   Type 2 diabetes mellitus with ESRD (end-stage renal disease) (HCC)   Hx of CABG   Hyponatremia   Patient with URI symptoms positive for influenza on day 4 of Tamiflu .  Appears her viral infection has triggered COPD exacerbation resulting in further respiratory distress.  Will treat her for COPD exacerbation as well.  She is status post IV Solu-Medrol  so complete 5 days with prednisone  40 mg.  Her QTc is prolonged so we will defer azithromycin  and place her on doxycycline  100 mg twice daily for 5 days.  Currently less concern for bacterial infection.  But will continue to monitor her leukocyte count, blood cultures, fever curve, and clinical picture.  CHF: Patient's volume is primarily managed with hemodialysis.  She is on 4 mg bumetanide  twice daily.  Appears euvolemic on exam. Will continue this medication while she is here.  ESRD: Pt on HD on MTueWF. Will consult nephrology for inpatient HD. No urgent needs. Last HD completed yesterday. Hgb at goal with >10. Continue sevelamer.   Hypertension: Patient is on Coreg  6.25 mg twice daily, losartan  50 mg daily.  Will continue her home regimen.  She is hypertensive and we can use as needed IV  antihypertensives.  Hyperlipidemia/CAD/Hx of CABG: Patient is on Zetia  will check lipid panel while she is here.  Hyponatremia: mild. Will monitor.   T2DM: On glargine and aspart here. Given influenza with decreased appetite will place on mSSI. Continue home gabapentin .   GERD: continue home PPI.   GOC: Pt had DNR at bedside. When asked about her code status, she stated she would like to be resuscitated and was not sure exactly meaning of the DNR at bedside. Discussed this further with the patient and she would like to think about this but for now wants to be full code.   VTE prophylaxis:  SCDs  Diet: Renal Code Status:  Full Telemetry:  Admission status: Observation, Telemetry bed Patient is from: Home Anticipated d/c is to: Home Anticipated d/c is in: 1-2 days   Family Communication: Updated at bedside  Consults called: EDP discussed with Nephrology   Severity of Illness: The appropriate patient status for this patient is OBSERVATION. Observation status is judged to be reasonable and necessary in order to provide the required intensity of service to ensure the patient's safety. The patient's presenting symptoms, physical exam findings, and initial radiographic and laboratory data in the context of their medical condition is felt to place them at decreased risk for further clinical deterioration. Furthermore, it is anticipated that the patient will be medically stable for discharge from the hospital within 2 midnights of admission.    Morene Bathe, MD Jolynn DEL. [facility]     [1]  Allergies Allergen Reactions   Porcine (Pork) Protein-Containing Drug Products Other (See Comments)

## 2024-02-08 NOTE — ED Provider Notes (Signed)
 [facility] Provider Note    Event Date/Time   First MD Initiated Contact with Patient 02/08/24 [phone]     (approximate)   History   Respiratory Distress (Patient to ER via EMS from [facility] for C/O increasing dyspnea and cough; and general malaise after testing Flu+ 4 days ago being treated with Tamiflu - Patient states she is a dialysis patient for Monday, Tuesday, Wednesday; and Friday. Patient given Duo nebs x 2; Solumedrol 125 mg; and Zofran  4 mg by EMS)   HPI  Katrina Ortiz is a 68 y.o. female with below comorbidities including COPD end-stage renal disease, last hemodialysis yesterday.  Reports flu positive body aches discomfort starting 4 days ago.  Coughing feeling progressively more short of breath.  2 L oxygen at home/nursing facility.  She reports taking Tamiflu for the last few days without improvement.  Coughing and wheezing feeling short of breath body aches and increasing fatigue and decreased appetite   Past Medical History:  Diagnosis Date   Anemia    CHF (congestive heart failure) (HCC)    Chronic kidney disease    Coronary artery disease    Diabetes mellitus without complication (HCC)    DISH (diffuse idiopathic skeletal hyperostosis)    GERD (gastroesophageal reflux disease)    Hyperkalemia    Hypertension    Psoriasis    Psoriasis      Physical Exam   Triage Vital Signs: ED Triage Vitals [02/08/24 0805]  Encounter Vitals Group     BP (!) 170/56     Girls Systolic BP Percentile      Girls Diastolic BP Percentile      Boys Systolic BP Percentile      Boys Diastolic BP Percentile      Pulse Rate 62     Resp 18     Temp 97.9 F (36.6 C)     Temp Source Oral     SpO2 100 %     Weight 200 lb (90.7 kg)     Height 5' 3 (1.6 m)     Head Circumference      Peak Flow      Pain Score 0     Pain Loc      Pain Education      Exclude from Growth Chart     Most recent vital signs: Vitals:   02/08/24 0930 02/08/24 1005   BP: (!) 161/69 (!) 170/56  Pulse: 63 63  Resp: (!) 21   Temp:    SpO2: 100% 100%     General: Awake, mild accessory use, no acute extremis though.  Coryza.  Coughing frequently.  Generally fatigued and ill but in no acute extremis CV:  Good peripheral perfusion.  Normal tone Resp:  Normal effort with exception of slight excess or muscle use and frequent coughing.  Moderate expiratory wheezing through all fields, central rhonchi. Abd:  No distention.  Obese nontender Other:  No appreciable lower extremity edema.  Patient reports she does not feel swollen.  History of dialysis but does not feel like she has extra fluid   ED Results / Procedures / Treatments   Labs (all labs ordered are listed, but only abnormal results are displayed) Labs Reviewed  RESP PANEL BY RT-PCR (RSV, FLU A&B, COVID)  RVPGX2 - Abnormal; Notable for the following components:      Result Value   Influenza A by PCR POSITIVE (*)    All other components within normal limits  BASIC METABOLIC  PANEL WITH GFR - Abnormal; Notable for the following components:   Sodium 131 (*)    Chloride 91 (*)    CO2 20 (*)    Glucose, Bld 125 (*)    BUN 64 (*)    Creatinine, Ser 4.67 (*)    Calcium  6.5 (*)    GFR, Estimated 10 (*)    Anion gap 20 (*)    All other components within normal limits  CBC - Abnormal; Notable for the following components:   RBC 3.77 (*)    Hemoglobin 11.1 (*)    HCT 34.4 (*)    Platelets 133 (*)    All other components within normal limits  PRO BRAIN NATRIURETIC PEPTIDE - Abnormal; Notable for the following components:   Pro Brain Natriuretic Peptide >35,000.0 (*)    All other components within normal limits  CULTURE, BLOOD (ROUTINE X 2)  CULTURE, BLOOD (ROUTINE X 2)  PROTIME-INR  LACTIC ACID, PLASMA  LACTIC ACID, PLASMA  PROCALCITONIN    Labs remarkable for normal white count.  proBNP is elevated in this patient with end-stage renal disease but she did have dialysis yesterday.  Elevated  procalcitonin but currently afebrile normal white count and no infiltrate on chest x-ray suspect findings most likely due to having influenza as well as likely reactive airway disease bronchitis or COPD associated  Labs are consistent with hemodialysis patient.  Have consulted nephrology  EKG  And inter by me at 1 PM heart rate 65 QRS 100 QTc 470 normal sinus rhythm, T wave inversion in V1 and V2.  No STEMI.  Compared with previous ECG from August 29 no significant changes noted   RADIOLOGY   Chest x-ray inter by me as increased vascular markings no obvious infiltrate or pneumonia     PROCEDURES:  Critical Care performed: Yes, see critical care procedure note(s)  CRITICAL CARE Performed by: Oneil Budge   Total critical care time: 25 minutes  Critical care time was exclusive of separately billable procedures and treating other patients.  Critical care was necessary to treat or prevent imminent or life-threatening deterioration.  Critical care was time spent personally by me on the following activities: development of treatment plan with patient and/or surrogate as well as nursing, discussions with consultants, evaluation of patient's response to treatment, examination of patient, obtaining history from patient or surrogate, ordering and performing treatments and interventions, ordering and review of laboratory studies, ordering and review of radiographic studies, pulse oximetry and re-evaluation of patient's condition.   Procedures   MEDICATIONS ORDERED IN ED: Medications  carbamide peroxide (DEBROX) 6.5 % OTIC (EAR) solution 5 drop (has no administration in time range)  albuterol  (PROVENTIL ) (2.5 MG/3ML) 0.083% nebulizer solution 2.5 mg (has no administration in time range)  albuterol  (PROVENTIL ) (2.5 MG/3ML) 0.083% nebulizer solution 2.5 mg (has no administration in time range)  ipratropium-albuterol  (DUONEB) 0.5-2.5 (3) MG/3ML nebulizer solution 3 mL (3 mLs Nebulization  Given 02/08/24 0846)     IMPRESSION / MDM / ASSESSMENT AND PLAN / ED COURSE  I reviewed the triage vital signs and the nursing notes.                              Differential diagnosis includes, but is not limited to, influenza, viral illness pneumonia, pulmonary edema reactive airway disease exacerbation of underlying COPD etc.  She is not having associated chest pain no pleuritic discomfort she is awake and alert but mild accessory muscle  use.  Multiple comorbidities and is currently on Tamiflu    Patient's presentation is most consistent with acute complicated illness / injury requiring diagnostic workup.   No clinical findings to be highly suggestive of ACS thromboembolism etc.  She does test flu positive and her history of bodyaches fatigue cough etc. seem to match that but given her comorbidities I think it is causing some respiratory distress though not severe at this time     The patient is on the cardiac monitor to evaluate for evidence of arrhythmia and/or significant heart rate changes.   Clinical Course as of 02/08/24 1246  Thu Feb 08, 2024  1237 Patient is resting but still slightly dyspneic.  She reports no improvement.  Mild accessory muscle use no fatigue.  She continues to have mild expiratory wheezing despite treatment earlier.  With her history of end-stage renal disease, positive influenza, I have not a high degree of suspicion for acute bacterial illness but suspect influenza triggering COPD exacerbation and perhaps mild volume overload.  I have previously discussed case with Dr. Marcelino and now placed formal consult.  Will admit for further care and treatment given her degree of comorbidities as well as present and ongoing dyspnea with wheezing. [MQ]  1238 Patient agreeab [MQ]    Clinical Course User Index [MQ] Dicky Anes, MD   Patient's has been ordered for additional nebulizer treatments due to persistent wheezing.  Suspect persistent bronchitis, influenza  etc.  Due to her mild but ongoing accessory muscle use will admit for further care and treatment.  Previously treated with steroids by EMS, continuing albuterol  treatments here awaiting nephrology consultation etc.  Consulted with patient accepted hospitalist service by Dr. Fernand  FINAL CLINICAL IMPRESSION(S) / ED DIAGNOSES   Final diagnoses:  COPD exacerbation (HCC)  Influenza  Dyspnea, unspecified type  ESRD (end stage renal disease) on dialysis [facility])     Rx / DC Orders   ED Discharge Orders     None        Note:  This document was prepared using Dragon voice recognition software and may include unintentional dictation errors.   Dicky Anes, MD 02/08/24 1300

## 2024-02-09 ENCOUNTER — Inpatient Hospital Stay

## 2024-02-09 DIAGNOSIS — E785 Hyperlipidemia, unspecified: Secondary | ICD-10-CM | POA: Diagnosis present

## 2024-02-09 DIAGNOSIS — E1122 Type 2 diabetes mellitus with diabetic chronic kidney disease: Secondary | ICD-10-CM | POA: Diagnosis present

## 2024-02-09 DIAGNOSIS — J440 Chronic obstructive pulmonary disease with acute lower respiratory infection: Secondary | ICD-10-CM | POA: Diagnosis present

## 2024-02-09 DIAGNOSIS — I2510 Atherosclerotic heart disease of native coronary artery without angina pectoris: Secondary | ICD-10-CM | POA: Diagnosis present

## 2024-02-09 DIAGNOSIS — D631 Anemia in chronic kidney disease: Secondary | ICD-10-CM | POA: Diagnosis present

## 2024-02-09 DIAGNOSIS — Z992 Dependence on renal dialysis: Secondary | ICD-10-CM | POA: Diagnosis not present

## 2024-02-09 DIAGNOSIS — I132 Hypertensive heart and chronic kidney disease with heart failure and with stage 5 chronic kidney disease, or end stage renal disease: Secondary | ICD-10-CM | POA: Diagnosis present

## 2024-02-09 DIAGNOSIS — E669 Obesity, unspecified: Secondary | ICD-10-CM | POA: Diagnosis present

## 2024-02-09 DIAGNOSIS — I10 Essential (primary) hypertension: Secondary | ICD-10-CM | POA: Diagnosis not present

## 2024-02-09 DIAGNOSIS — J441 Chronic obstructive pulmonary disease with (acute) exacerbation: Secondary | ICD-10-CM | POA: Diagnosis present

## 2024-02-09 DIAGNOSIS — J111 Influenza due to unidentified influenza virus with other respiratory manifestations: Secondary | ICD-10-CM | POA: Diagnosis not present

## 2024-02-09 DIAGNOSIS — N186 End stage renal disease: Secondary | ICD-10-CM | POA: Diagnosis present

## 2024-02-09 DIAGNOSIS — Z951 Presence of aortocoronary bypass graft: Secondary | ICD-10-CM | POA: Diagnosis not present

## 2024-02-09 DIAGNOSIS — J101 Influenza due to other identified influenza virus with other respiratory manifestations: Secondary | ICD-10-CM | POA: Diagnosis present

## 2024-02-09 DIAGNOSIS — I5032 Chronic diastolic (congestive) heart failure: Secondary | ICD-10-CM | POA: Diagnosis not present

## 2024-02-09 DIAGNOSIS — N2581 Secondary hyperparathyroidism of renal origin: Secondary | ICD-10-CM | POA: Diagnosis present

## 2024-02-09 DIAGNOSIS — I2489 Other forms of acute ischemic heart disease: Secondary | ICD-10-CM | POA: Diagnosis present

## 2024-02-09 DIAGNOSIS — E11649 Type 2 diabetes mellitus with hypoglycemia without coma: Secondary | ICD-10-CM | POA: Diagnosis not present

## 2024-02-09 DIAGNOSIS — E875 Hyperkalemia: Secondary | ICD-10-CM | POA: Diagnosis present

## 2024-02-09 DIAGNOSIS — Z66 Do not resuscitate: Secondary | ICD-10-CM | POA: Diagnosis present

## 2024-02-09 DIAGNOSIS — K219 Gastro-esophageal reflux disease without esophagitis: Secondary | ICD-10-CM | POA: Diagnosis present

## 2024-02-09 DIAGNOSIS — R0600 Dyspnea, unspecified: Secondary | ICD-10-CM | POA: Diagnosis not present

## 2024-02-09 DIAGNOSIS — E1165 Type 2 diabetes mellitus with hyperglycemia: Secondary | ICD-10-CM | POA: Diagnosis present

## 2024-02-09 DIAGNOSIS — Z794 Long term (current) use of insulin: Secondary | ICD-10-CM | POA: Diagnosis not present

## 2024-02-09 DIAGNOSIS — K5900 Constipation, unspecified: Secondary | ICD-10-CM | POA: Diagnosis not present

## 2024-02-09 DIAGNOSIS — I959 Hypotension, unspecified: Secondary | ICD-10-CM | POA: Diagnosis not present

## 2024-02-09 DIAGNOSIS — E871 Hypo-osmolality and hyponatremia: Secondary | ICD-10-CM | POA: Diagnosis present

## 2024-02-09 DIAGNOSIS — I5033 Acute on chronic diastolic (congestive) heart failure: Secondary | ICD-10-CM | POA: Diagnosis present

## 2024-02-09 LAB — GLUCOSE, CAPILLARY
Glucose-Capillary: 295 mg/dL — ABNORMAL HIGH (ref 70–99)
Glucose-Capillary: 215 mg/dL — ABNORMAL HIGH (ref 70–99)
Glucose-Capillary: 254 mg/dL — ABNORMAL HIGH (ref 70–99)

## 2024-02-09 LAB — HEPATITIS B SURFACE ANTIGEN: Hepatitis B Surface Ag: NONREACTIVE

## 2024-02-09 LAB — CBC
HCT: 32.9 % — ABNORMAL LOW (ref 36.0–46.0)
HCT: 33.7 % — ABNORMAL LOW (ref 36.0–46.0)
Hemoglobin: 10.5 g/dL — ABNORMAL LOW (ref 12.0–15.0)
Hemoglobin: 11 g/dL — ABNORMAL LOW (ref 12.0–15.0)
MCH: 28.8 pg (ref 26.0–34.0)
MCH: 28.9 pg (ref 26.0–34.0)
MCHC: 31.9 g/dL (ref 30.0–36.0)
MCHC: 32.6 g/dL (ref 30.0–36.0)
MCV: 90.1 fL (ref 80.0–100.0)
MCV: 88.7 fL (ref 80.0–100.0)
Platelets: 129 K/uL — ABNORMAL LOW (ref 150–400)
Platelets: 155 K/uL (ref 150–400)
RBC: 3.65 MIL/uL — ABNORMAL LOW (ref 3.87–5.11)
RBC: 3.8 MIL/uL — ABNORMAL LOW (ref 3.87–5.11)
RDW: 13.7 % (ref 11.5–15.5)
RDW: 13.6 % (ref 11.5–15.5)
WBC: 3.7 K/uL — ABNORMAL LOW (ref 4.0–10.5)
WBC: 5.2 K/uL (ref 4.0–10.5)
nRBC: 0 % (ref 0.0–0.2)
nRBC: 0 % (ref 0.0–0.2)

## 2024-02-09 LAB — RENAL FUNCTION PANEL
Albumin: 3.5 g/dL (ref 3.5–5.0)
Anion gap: 20 — ABNORMAL HIGH (ref 5–15)
BUN: 82 mg/dL — ABNORMAL HIGH (ref 8–23)
CO2: 20 mmol/L — ABNORMAL LOW (ref 22–32)
Calcium: 6.6 mg/dL — ABNORMAL LOW (ref 8.9–10.3)
Chloride: 87 mmol/L — ABNORMAL LOW (ref 98–111)
Creatinine, Ser: 5.32 mg/dL — ABNORMAL HIGH (ref 0.44–1.00)
GFR, Estimated: 8 mL/min — ABNORMAL LOW
Glucose, Bld: 331 mg/dL — ABNORMAL HIGH (ref 70–99)
Phosphorus: 6.9 mg/dL — ABNORMAL HIGH (ref 2.5–4.6)
Potassium: 5.7 mmol/L — ABNORMAL HIGH (ref 3.5–5.1)
Sodium: 127 mmol/L — ABNORMAL LOW (ref 135–145)

## 2024-02-09 LAB — HEMOGLOBIN A1C
Hgb A1c MFr Bld: 8 % — ABNORMAL HIGH (ref 4.8–5.6)
Mean Plasma Glucose: 182.9 mg/dL

## 2024-02-09 MED ORDER — CHLORHEXIDINE GLUCONATE CLOTH 2 % EX PADS
6.0000 | MEDICATED_PAD | Freq: Every day | CUTANEOUS | Status: AC
  Administered 2024-02-09 – 2024-02-13 (×5): 6 via TOPICAL

## 2024-02-09 MED ORDER — PENTAFLUOROPROP-TETRAFLUOROETH EX AERO: 1.0000 | INHALATION_SPRAY | CUTANEOUS | Status: DC | PRN

## 2024-02-09 MED ORDER — CALCIUM GLUCONATE-NACL 1-0.675 GM/50ML-% IV SOLN
1.0000 g | Freq: Once | INTRAVENOUS | Status: AC
  Administered 2024-02-09: 1000 mg via INTRAVENOUS
  Filled 2024-02-09 (×2): qty 50

## 2024-02-09 MED ORDER — IPRATROPIUM-ALBUTEROL 0.5-2.5 (3) MG/3ML IN SOLN
3.0000 mL | Freq: Three times a day (TID) | RESPIRATORY_TRACT | Status: DC
  Administered 2024-02-09 – 2024-02-10 (×4): 3 mL via RESPIRATORY_TRACT
  Filled 2024-02-09 (×4): qty 3

## 2024-02-09 MED ORDER — OSELTAMIVIR PHOSPHATE 30 MG PO CAPS: 30.0000 mg | ORAL_CAPSULE | Freq: Two times a day (BID) | ORAL | Status: DC

## 2024-02-09 MED ORDER — OSELTAMIVIR PHOSPHATE 30 MG PO CAPS
30.0000 mg | ORAL_CAPSULE | Freq: Every day | ORAL | Status: AC
  Administered 2024-02-09: 30 mg via ORAL
  Filled 2024-02-09: qty 1

## 2024-02-09 MED ORDER — OSELTAMIVIR PHOSPHATE 75 MG PO CAPS: 75.0000 mg | ORAL_CAPSULE | Freq: Two times a day (BID) | ORAL | Status: DC

## 2024-02-09 MED ORDER — BUMETANIDE 1 MG PO TABS
4.0000 mg | ORAL_TABLET | Freq: Two times a day (BID) | ORAL | Status: DC
  Administered 2024-02-09 – 2024-02-10 (×3): 4 mg via ORAL
  Filled 2024-02-09 (×6): qty 4

## 2024-02-09 MED ORDER — HEPARIN SODIUM (PORCINE) 1000 UNIT/ML IJ SOLN
INTRAMUSCULAR | Status: AC
  Filled 2024-02-09: qty 4

## 2024-02-09 MED ORDER — INSULIN GLARGINE-YFGN 100 UNIT/ML ~~LOC~~ SOLN: 10.0000 [IU] | Freq: Every day | SUBCUTANEOUS | Status: DC

## 2024-02-09 MED ORDER — CHLORHEXIDINE GLUCONATE CLOTH 2 % EX PADS
6.0000 | MEDICATED_PAD | Freq: Every day | CUTANEOUS | Status: DC
  Administered 2024-02-09 – 2024-02-16 (×6): 6 via TOPICAL

## 2024-02-09 MED ORDER — INSULIN GLARGINE 100 UNIT/ML ~~LOC~~ SOLN
10.0000 [IU] | Freq: Every day | SUBCUTANEOUS | Status: DC
  Administered 2024-02-09: 10 [IU] via SUBCUTANEOUS
  Filled 2024-02-09 (×2): qty 0.1

## 2024-02-09 MED ORDER — PANTOPRAZOLE SODIUM 40 MG PO TBEC
40.0000 mg | DELAYED_RELEASE_TABLET | Freq: Every day | ORAL | Status: DC
  Administered 2024-02-09 – 2024-02-16 (×7): 40 mg via ORAL
  Filled 2024-02-09 (×7): qty 1

## 2024-02-09 MED ORDER — LIDOCAINE-PRILOCAINE 2.5-2.5 % EX CREA: 1.0000 | TOPICAL_CREAM | CUTANEOUS | Status: DC | PRN

## 2024-02-09 MED ORDER — HEPARIN SODIUM (PORCINE) 5000 UNIT/ML IJ SOLN
5000.0000 [IU] | Freq: Three times a day (TID) | INTRAMUSCULAR | Status: DC
  Administered 2024-02-09 – 2024-02-16 (×19): 5000 [IU] via SUBCUTANEOUS
  Filled 2024-02-09 (×19): qty 1

## 2024-02-09 MED ORDER — LIDOCAINE HCL (PF) 1 % IJ SOLN: 5.0000 mL | INTRAMUSCULAR | Status: DC | PRN

## 2024-02-09 MED ORDER — LINACLOTIDE 145 MCG PO CAPS
145.0000 ug | ORAL_CAPSULE | Freq: Every evening | ORAL | Status: DC
  Administered 2024-02-09 – 2024-02-14 (×6): 145 ug via ORAL
  Filled 2024-02-09 (×9): qty 1

## 2024-02-09 MED ORDER — ALTEPLASE 2 MG IJ SOLR: 2.0000 mg | Freq: Once | INTRAMUSCULAR | Status: DC | PRN

## 2024-02-09 MED ORDER — MUPIROCIN 2 % EX OINT
1.0000 | TOPICAL_OINTMENT | Freq: Two times a day (BID) | CUTANEOUS | Status: AC
  Administered 2024-02-09 – 2024-02-13 (×9): 1 via NASAL
  Filled 2024-02-09: qty 22

## 2024-02-09 MED ORDER — INSULIN ASPART 100 UNIT/ML IJ SOLN
0.0000 [IU] | Freq: Three times a day (TID) | INTRAMUSCULAR | Status: DC
  Administered 2024-02-09 – 2024-02-10 (×2): 2 [IU] via SUBCUTANEOUS
  Administered 2024-02-10 (×2): 3 [IU] via SUBCUTANEOUS
  Administered 2024-02-11 (×2): 1 [IU] via SUBCUTANEOUS
  Administered 2024-02-12: 2 [IU] via SUBCUTANEOUS
  Filled 2024-02-09: qty 1
  Filled 2024-02-09: qty 0.06
  Filled 2024-02-09: qty 2
  Filled 2024-02-09: qty 4
  Filled 2024-02-09: qty 2
  Filled 2024-02-09 (×2): qty 3
  Filled 2024-02-09: qty 2

## 2024-02-09 NOTE — Plan of Care (Signed)
  Problem: Clinical Measurements: Goal: Respiratory complications will improve Outcome: Progressing   Problem: Nutrition: Goal: Adequate nutrition will be maintained Outcome: Progressing   Problem: Coping: Goal: Level of anxiety will decrease Outcome: Progressing   Problem: Pain Managment: Goal: General experience of comfort will improve and/or be controlled Outcome: Progressing   Problem: Safety: Goal: Ability to remain free from injury will improve Outcome: Progressing   Problem: Skin Integrity: Goal: Risk for impaired skin integrity will decrease Outcome: Progressing

## 2024-02-09 NOTE — Progress Notes (Addendum)
" °  Progress Note   Patient: Katrina Ortiz FMW:[id number] DOB: 01/12/1956 DOA: 02/08/2024     0 DOS: the patient was seen and examined on 02/09/2024   Brief hospital course:  Katrina Ortiz is a 68 y.o. year old female with medical history of hypertension, hyperlipidemia, type 2 diabetes, ESRD on HD Monday, Tuesday, Wednesday, Friday presenting to the ED with worsening shortness of breath.   Patient reports not feeling well about since 4 days ago with URI symptoms and her facility tested her and she was positive for flu.  She was started on Tamiflu  on 12/15 but her symptoms have persisted.  Given her continued symptoms, she requested to go to the ED for further evaluation. She reports diffuse muscle pains, decreased appetite and malaise. Does not report fevers but states she has chills.      On arrival to the ED patient was noted to be HDS stable.  Lab work and imaging obtained.  CBC without leukocytosis, anemia at baseline, slight thrombocytopenia at 133.  BMP with mild hyponatremia at 131, chronic ESRD changes, significant hypocalcemia at 6.5.  Respiratory panel positive for influenza A.  proBNP checked and greater than 35,000.  Procalcitonin slightly elevated at 0.69.  Lactic acid normal.  Blood cultures obtained.  Chest x-ray shows vascular congestion but no opacity.  Patient given treatment with DuoNeb but given continued respiratory distress, TRH contacted for admission.    Assessment and Plan:   COPD with acute exacerbation Most likely triggered by acute influenza infection Continue systemic and inhaled steroids, bronchodilator therapy Continue oxygen supplementation to maintain pulse oximetry greater than 92%    Acute on chronic diastolic dysfunction CHF:  Patient's volume status is primarily managed with hemodialysis.   Continue 4 mg bumetanide  twice daily, carvedilol  and losartan  Continue scheduled renal replacement therapy   Diabetes mellitus with complications of ESRD:  Pt  on HD on M/Tue/W/F. Nephrology consult for renal replacement therapy Resume Lantus  10 units daily with sliding scale coverage   Hypertension:  Continue carvedilol  and losartan     CAD/Hx of CABG:  Continue carvedilol  and Zetia    Hyperkalemia Expect improvement following renal replacement therapy   Hyponatremia: Most likely secondary to volume overload Expect improvement in sodium levels following dialysis      GERD:  Continue home PPI.     Hypocalcemia Supplement calcium      Subjective: Complains of generalized weakness  Physical Exam: Vitals:   02/09/24 1400 02/09/24 1430 02/09/24 1450 02/09/24 1451  BP: 131/68 (!) 182/87 (!) 174/86 (!) 189/86  Pulse: 70 70 72 72  Resp: 20 18 (!) 21 15  Temp:    98 F (36.7 C)  TempSrc:      SpO2: 99% 99% 99% 98%  Weight:      Height:       Gen: Chronically ill appearing female appearing older than stated age.  HENT: NCAT CV: normal heart sounds Lung: diffuse rhonchi present Abd: No TTP, normal bowel sounds MSK: No asymmetry, good bulk and tone Neuro: alert and oriented   Data Reviewed: Sodium 127, potassium 5.7, hemoglobin 11.0, calcium  6.6 Labs reviewed  Family Communication: Plan of care was discussed with patient in detail.  She verbalizes understanding and agrees with the plan  Disposition: Status is: Inpatient Remains inpatient appropriate because: Treatment for acute COPD  Planned Discharge Destination: Skilled nursing facility    Time spent: 55 minutes  Author: Aimee Somerset, MD 02/09/2024 3:21 PM  For on call review [email or web address].  "

## 2024-02-09 NOTE — TOC Initial Note (Signed)
 Transition of Care [facility]) - Initial/Assessment Note    Patient Details  Name: Katrina Ortiz MRN: [id number] Date of Birth: 1955-11-06  Transition of Care [facility]) CM/SW Contact:    Nathanael CHRISTELLA Ring, RN Phone Number: 02/09/2024, 1:32 PM  Clinical Narrative:                  Patient is long term care at [facility].  Currently in dialysis.  Plan is for return to SNF at discharge.  Expected Discharge Plan: Skilled Nursing Facility Barriers to Discharge: Continued Medical Work up   Patient Goals and CMS Choice            Expected Discharge Plan and Services   Discharge Planning Services: CM Consult   Living arrangements for the past 2 months: Skilled Nursing Facility                 DME Arranged: N/A         HH Arranged: NA          Prior Living Arrangements/Services Living arrangements for the past 2 months: Skilled Nursing Facility Lives with:: Facility Resident Patient language and need for interpreter reviewed:: Yes        Need for Family Participation in Patient Care: Yes (Comment) Care giver support system in place?: Yes (comment)   Criminal Activity/Legal Involvement Pertinent to Current Situation/Hospitalization: No - Comment as needed  Activities of Daily Living   ADL Screening (condition at time of admission) Independently performs ADLs?: No Does the patient have a NEW difficulty with bathing/dressing/toileting/self-feeding that is expected to last >3 days?: No Does the patient have a NEW difficulty with getting in/out of bed, walking, or climbing stairs that is expected to last >3 days?: No Does the patient have a NEW difficulty with communication that is expected to last >3 days?: No Is the patient deaf or have difficulty hearing?: Yes Does the patient have difficulty seeing, even when wearing glasses/contacts?: Yes Does the patient have difficulty concentrating, remembering, or making decisions?: No  Permission Sought/Granted Permission sought to  share information with : Facility Medical Sales Representative                Emotional Assessment       Orientation: : Oriented to Self, Oriented to Place, Oriented to  Time, Oriented to Situation Alcohol  / Substance Use: Not Applicable Psych Involvement: No (comment)  Admission diagnosis:  Influenza A [J10.1] COPD exacerbation (HCC) [J44.1] ESRD (end stage renal disease) on dialysis (HCC) [N18.6, Z99.2] Influenza [J11.1] Dyspnea, unspecified type [R06.00] Patient Active Problem List   Diagnosis Date Noted   Influenza A 02/08/2024   Hypotension 10/21/2023   Myocardial injury 10/21/2023   Hyponatremia 10/21/2023   Abdominal pain 10/17/2023   Tibial plateau fracture, right, sequela 10/17/2023   HLD (hyperlipidemia) 10/17/2023   Chronic heart failure with preserved ejection fraction (HFpEF) (HCC) 10/17/2023   Obesity (BMI 30-39.9) 10/17/2023   ESRD (end stage renal disease) on dialysis (HCC) 10/17/2023   Type II diabetes mellitus with renal manifestations (HCC) 10/17/2023   Anemia in ESRD (end-stage renal disease) (HCC) 10/17/2023   Pyelonephritis 08/28/2023   Hyperkalemia 04/19/2021   Essential hypertension 04/16/2021   CHF exacerbation (HCC) 04/14/2021   Cellulitis of left wrist    UTI due to extended-spectrum beta lactamase (ESBL) producing Escherichia coli    Cellulitis of right forearm    CKD (chronic kidney disease) stage 4, GFR 15-29 ml/min (HCC)    Anemia of chronic kidney failure, stage 4 (severe) (HCC)  Hx of CABG    Physical deconditioning    Nausea and vomiting 04/11/2021   SOB (shortness of breath)    AKI (acute kidney injury) 04/09/2021   Acute on chronic diastolic CHF (congestive heart failure) (HCC) 04/09/2021   Hypothermia 04/09/2021   Type 2 diabetes mellitus with ESRD (end-stage renal disease) (HCC) 04/14/2019   Acute diastolic CHF (congestive heart failure) (HCC) 04/14/2019   Hypoglycemia 04/14/2019   Acute on chronic hypoxic respiratory failure  (HCC) 04/10/2019   Coronary artery disease 05/23/2010   PCP:  Relda Heron LABOR, MD Pharmacy:   [facility] [address] [place] [address] [place] [address] Phone: [phone] Fax: [phone]     Social Drivers of Health (SDOH) Social History: SDOH Screenings   Food Insecurity: No Food Insecurity (02/08/2024)  Housing: High Risk (02/08/2024)  Transportation Needs: No Transportation Needs (02/08/2024)  Utilities: Not At Risk (02/08/2024)  Financial Resource Strain: Low Risk (07/06/2023)   Received from [facility]  Social Connections: Unknown (02/08/2024)  Tobacco Use: Medium Risk (02/08/2024)   SDOH Interventions:     Readmission Risk Interventions    02/09/2024    1:30 PM  Readmission Risk Prevention Plan  Transportation Screening Complete  Medication Review (RN Care Manager) Complete  PCP or Specialist appointment within 3-5 days of discharge Complete  HRI or Home Care Consult Complete  SW Recovery Care/Counseling Consult Complete  Palliative Care Screening Not Applicable  Skilled Nursing Facility Complete

## 2024-02-09 NOTE — Progress Notes (Signed)
 Blood glucose checked in HD. Blood glucose result 178.

## 2024-02-09 NOTE — Inpatient Diabetes Management (Signed)
 Inpatient Diabetes Program Recommendations  AACE/ADA: New Consensus Statement on Inpatient Glycemic Control   Target Ranges:  Prepandial:   less than 140 mg/dL      Peak postprandial:   less than 180 mg/dL (1-2 hours)      Critically ill patients:  140 - 180 mg/dL    Latest Reference Range & Units 02/09/24 08:34  Glucose-Capillary 70 - 99 mg/dL 704 (H)    Latest Reference Range & Units 02/08/24 08:28 02/09/24 06:20  Glucose 70 - 99 mg/dL 874 (H) 668 (H)   Review of Glycemic Control  Diabetes history: DM2 Outpatient Diabetes medications: Lantus  10 units at bedtime, Humalog  TID with meals Current orders for Inpatient glycemic control: Prednisone  40 mg daily  Inpatient Diabetes Program Recommendations:    Insulin : Please consider ordering Lantus  10 units at bedtime, CBGs AC&HS, Novolog  0-9 units TID with meals, and Novolog  0-5 units QHS  Thanks, Earnie Gainer, RN, MSN, CDCES Diabetes Coordinator Inpatient Diabetes Program [phone] (Team Pager from 8am to 5pm)

## 2024-02-09 NOTE — Evaluation (Signed)
 Occupational Therapy Evaluation Patient Details Name: Katrina Ortiz MRN: [id number] DOB: August 26, 1955 Today's Date: 02/09/2024   History of Present Illness   Pt is a 68 y.o. year old female PMH of hypertension, hyperlipidemia, type 2 diabetes, ESRD on HD presenting to the ED with worsening shortness of breath from flu A and COPD exacerbation.     Clinical Impressions Pt was seen for OT evaluation this date. PTA, pt is a LTC resident at [facility] where she is bed and wheelchair bound and transfers via hoyer lift daily. She is able to self feeding and perform grooming tasks with set up assist, otherwise total A for ADLs. Pt is notably weak and fatigued from current infection, but continues to demo ability to reposition self in bed using bed rails and cueing from therapist with MOD I. BUE ROM intact and strength 3+/5. Still able to self feed and perform grooming tasks and appears at baseline otherwise. Recommend to be followed by mobility while hospitalized to prevent further weakness via hoyer lift transfers to recliner or bed level exercises. Pt with stable VS during session and left with bed in chair position to prepare for breakfast. No further acute OT needs, will sign off in house with recommendation to return to LTC once medically stable.      If plan is discharge home, recommend the following:   Two people to help with walking and/or transfers;A lot of help with bathing/dressing/bathroom     Functional Status Assessment   Patient has not had a recent decline in their functional status     Equipment Recommendations   None recommended by OT     Recommendations for Other Services         Precautions/Restrictions   Precautions Precautions: Fall Recall of Precautions/Restrictions: Intact Restrictions Weight Bearing Restrictions Per Provider Order: No     Mobility Bed Mobility Overal bed mobility: Modified Independent             General bed mobility comments:  able to pull self up in bed with cues for bed rails use and use of bed features, e.g. trendelenburg    Transfers                   General transfer comment: deferred-hoyer lift at baseline      Balance Overall balance assessment: Mild deficits observed, not formally tested                                         ADL either performed or assessed with clinical judgement   ADL Overall ADL's : At baseline                                             Vision         Perception         Praxis         Pertinent Vitals/Pain Pain Assessment Pain Assessment: Faces Faces Pain Scale: Hurts a little bit Pain Location: generalized Pain Descriptors / Indicators: Aching Pain Intervention(s): Monitored during session     Extremity/Trunk Assessment Upper Extremity Assessment Upper Extremity Assessment: Generalized weakness   Lower Extremity Assessment Lower Extremity Assessment: Generalized weakness       Communication Communication Communication: No apparent difficulties   Cognition Arousal: Alert Behavior  During Therapy: WFL for tasks assessed/performed Cognition: No apparent impairments                                       Cueing  General Comments      VSS during session   Exercises Other Exercises Other Exercises: Edu on role of OT in acute setting, BUE exercises, upright posture in bed for pulmonary toileting.   Shoulder Instructions      Home Living Family/patient expects to be discharged to:: Skilled nursing facility (LTC at [facility])                                        Prior Functioning/Environment               Mobility Comments: reports being bedridden at LTC from neuropathy and hoyer lifts to W/C daily and is able to self propel herself ADLs Comments: total assist for most ADLs, except self feeding and grooming tasks    OT Problem List: Decreased  strength;Decreased activity tolerance   OT Treatment/Interventions:        OT Goals(Current goals can be found in the care plan section)       OT Frequency:       Co-evaluation              AM-PAC OT 6 Clicks Daily Activity     Outcome Measure Help from another person eating meals?: A Little Help from another person taking care of personal grooming?: A Little Help from another person toileting, which includes using toliet, bedpan, or urinal?: Total Help from another person bathing (including washing, rinsing, drying)?: A Lot Help from another person to put on and taking off regular upper body clothing?: A Lot Help from another person to put on and taking off regular lower body clothing?: A Lot 6 Click Score: 13   End of Session Equipment Utilized During Treatment: Oxygen Nurse Communication: Mobility status  Activity Tolerance: Patient tolerated treatment well Patient left: in bed;with call bell/phone within reach;with bed alarm set;with nursing/sitter in room  OT Visit Diagnosis: Muscle weakness (generalized) (M62.81)                Time: 9186-9171 OT Time Calculation (min): 15 min Charges:  OT General Charges $OT Visit: 1 Visit OT Evaluation $OT Eval Low Complexity: 1 Low Tionne Dayhoff Chrismon, OTR/L  02/09/2024, 8:39 AM  Chibueze Beasley E Chrismon 02/09/2024, 8:35 AM

## 2024-02-09 NOTE — NC FL2 (Signed)
 " [place]  MEDICAID FL2 LEVEL OF CARE FORM     IDENTIFICATION  Patient Name: Katrina Ortiz Birthdate: 05-21-1955 Sex: female Admission Date (Current Location): 02/08/2024  [facility] and [place] Number:      Facility and Address:  [facility], [address] [place], [place] [address]      Provider Number: [id number]  Attending Physician Name and Address:  Lanetta Lingo, MD  Relative Name and Phone Number:  Winola Drum, son, [phone]    Current Level of Care: Hospital Recommended Level of Care: Skilled Nursing Facility Prior Approval Number:    Date Approved/Denied:   PASRR Number:    Discharge Plan: SNF    Current Diagnoses: Patient Active Problem List   Diagnosis Date Noted   Influenza A 02/08/2024   Hypotension 10/21/2023   Myocardial injury 10/21/2023   Hyponatremia 10/21/2023   Abdominal pain 10/17/2023   Tibial plateau fracture, right, sequela 10/17/2023   HLD (hyperlipidemia) 10/17/2023   Chronic heart failure with preserved ejection fraction (HFpEF) (HCC) 10/17/2023   Obesity (BMI 30-39.9) 10/17/2023   ESRD (end stage renal disease) on dialysis (HCC) 10/17/2023   Type II diabetes mellitus with renal manifestations (HCC) 10/17/2023   Anemia in ESRD (end-stage renal disease) (HCC) 10/17/2023   Pyelonephritis 08/28/2023   Hyperkalemia 04/19/2021   Essential hypertension 04/16/2021   CHF exacerbation (HCC) 04/14/2021   Cellulitis of left wrist    UTI due to extended-spectrum beta lactamase (ESBL) producing Escherichia coli    Cellulitis of right forearm    CKD (chronic kidney disease) stage 4, GFR 15-29 ml/min (HCC)    Anemia of chronic kidney failure, stage 4 (severe) (HCC)    Hx of CABG    Physical deconditioning    Nausea and vomiting 04/11/2021   SOB (shortness of breath)    AKI (acute kidney injury) 04/09/2021   Acute on chronic diastolic CHF (congestive heart failure) (HCC) 04/09/2021   Hypothermia 04/09/2021    Type 2 diabetes mellitus with ESRD (end-stage renal disease) (HCC) 04/14/2019   Acute diastolic CHF (congestive heart failure) (HCC) 04/14/2019   Hypoglycemia 04/14/2019   Acute on chronic hypoxic respiratory failure (HCC) 04/10/2019   Coronary artery disease 05/23/2010    Orientation RESPIRATION BLADDER Height & Weight     Self, Time, Situation, Place  Normal External catheter Weight: 93.5 kg Height:  5' 3 (160 cm)  BEHAVIORAL SYMPTOMS/MOOD NEUROLOGICAL BOWEL NUTRITION STATUS      Incontinent Diet (Carb modified- 1600-2000 calorie/ day)  AMBULATORY STATUS COMMUNICATION OF NEEDS Skin   Total Care Verbally PU Stage and Appropriate Care   PU Stage 2 Dressing: Ortiz                   Personal Care Assistance Level of Assistance  Bathing, Feeding, Dressing Bathing Assistance: Maximum assistance Feeding assistance: Limited assistance Dressing Assistance: Maximum assistance     Functional Limitations Info  Sight, Hearing, Speech Sight Info: Adequate Hearing Info: Impaired Speech Info: Adequate    SPECIAL CARE FACTORS FREQUENCY                       Contractures Contractures Info: Not present    Additional Factors Info  Code Status, Allergies, Isolation Precautions Code Status Info: Full Allergies Info: Porcine (pork) Protein-containing Drug Products Not Specified  Other (See Comments)     Isolation Precautions Info: Droplet/ Contact     Current Medications (02/09/2024):  This is the current hospital active medication list Current Facility-Administered  Medications  Medication Dose Route Frequency Provider Last Rate Last Admin   acetaminophen  (TYLENOL ) tablet 650 mg  650 mg Oral Q6H PRN Khan, Ghalib, MD   650 mg at 02/09/24 0522   Or   acetaminophen  (TYLENOL ) suppository 650 mg  650 mg Rectal Q6H PRN Fernand Prost, MD       alteplase  (CATHFLO ACTIVASE ) injection 2 mg  2 mg Intracatheter Once PRN Lateef, Munsoor, MD       benzonatate  (TESSALON ) capsule 100 mg   100 mg Oral TID Khan, Ghalib, MD   100 mg at 02/09/24 0920   budesonide  (PULMICORT ) nebulizer solution 0.5 mg  0.5 mg Nebulization BID Khan, Ghalib, MD   0.5 mg at 02/09/24 9277   bumetanide  (BUMEX ) tablet 4 mg  4 mg Oral BID Agbata, Tochukwu, MD       carvedilol  (COREG ) tablet 6.25 mg  6.25 mg Oral BID WC Fernand Prost, MD   6.25 mg at 02/09/24 0920   Chlorhexidine  Gluconate Cloth 2 % PADS 6 each  6 each Topical Ortiz Fernand Prost, MD       Chlorhexidine  Gluconate Cloth 2 % PADS 6 each  6 each Topical Q0600 Lateef, Munsoor, MD       doxycycline  (VIBRA -TABS) tablet 100 mg  100 mg Oral Q12H Khan, Ghalib, MD   100 mg at 02/09/24 0920   insulin  aspart (novoLOG ) injection 0-6 Units  0-6 Units Subcutaneous TID WC Agbata, Tochukwu, MD       insulin  glargine (LANTUS ) injection 10 Units  10 Units Subcutaneous QHS Merrill, Kristin A, RPH       ipratropium-albuterol  (DUONEB) 0.5-2.5 (3) MG/3ML nebulizer solution 3 mL  3 mL Nebulization TID Fernand Prost, MD   3 mL at 02/09/24 9277   labetalol  (NORMODYNE ) injection 5 mg  5 mg Intravenous Q2H PRN Khan, Ghalib, MD   5 mg at 02/08/24 1817   lidocaine  (PF) (XYLOCAINE ) 1 % injection 5 mL  5 mL Intradermal PRN Lateef, Munsoor, MD       lidocaine -prilocaine  (EMLA ) cream 1 Application  1 Application Topical PRN Lateef, Munsoor, MD       linaclotide  (LINZESS ) capsule 145 mcg  145 mcg Oral QPM Agbata, Tochukwu, MD       losartan  (COZAAR ) tablet 50 mg  50 mg Oral Ortiz Khan, Ghalib, MD   50 mg at 02/09/24 0920   mupirocin  ointment (BACTROBAN ) 2 % 1 Application  1 Application Nasal BID Khan, Ghalib, MD   1 Application at 02/09/24 9076   ondansetron  (ZOFRAN ) tablet 4 mg  4 mg Oral Q6H PRN Fernand Prost, MD       Or   ondansetron  (ZOFRAN ) injection 4 mg  4 mg Intravenous Q6H PRN Khan, Ghalib, MD       oseltamivir  (TAMIFLU ) capsule 30 mg  30 mg Oral Ortiz Agbata, Tochukwu, MD       pantoprazole  (PROTONIX ) EC tablet 40 mg  40 mg Oral Ortiz Agbata, Tochukwu, MD        pentafluoroprop-tetrafluoroeth (GEBAUERS) aerosol 1 Application  1 Application Topical PRN Lateef, Munsoor, MD       predniSONE  (DELTASONE ) tablet 40 mg  40 mg Oral Q breakfast Khan, Ghalib, MD   40 mg at 02/09/24 0920   senna-docusate (Senokot-S) tablet 1 tablet  1 tablet Oral QHS PRN Fernand Prost, MD       sodium chloride  flush (NS) 0.9 % injection 3 mL  3 mL Intravenous Q12H Khan, Ghalib, MD   3 mL at 02/09/24 [phone]  Discharge Medications: Please see discharge summary for a list of discharge medications.  Relevant Imaging Results:  Relevant Lab Results:   Additional Information SS#: [id number]. HD MTWF.  Lyndon Chenoweth M Gwendloyn Forsee, RN     "

## 2024-02-09 NOTE — Progress Notes (Signed)
 Pt receives outpt HD at [facility]. Navigator following to assist with any HD needs.  Suzen Satchel Dialysis Navigator [phone]

## 2024-02-09 NOTE — Progress Notes (Signed)
 PHARMACY NOTE:  ANTIMICROBIAL/ANTIVIRAL RENAL DOSAGE ADJUSTMENT  Current antimicrobial/antiviral regimen includes a mismatch between antimicrobial/antiviral dosage and estimated renal function.  As per policy approved by the Pharmacy & Therapeutics and Medical Executive Committees, the antimicrobial/antiviral dosage will be adjusted accordingly.  Current antimicrobial/antiviral dosage:  Tamiflu  30 mg BID x 1 day  Indication: flu+  Renal Function:  Estimated Creatinine Clearance: 10.7 mL/min (A) (by C-G formula based on SCr of 5.32 mg/dL (H)). [x]      On intermittent HD, scheduled: M,T,W,F     Antimicrobial dosage has been changed to:  Tamiflu  30mg  daily x 1 day  Additional comments:   Thank you for allowing pharmacy to be a part of this patient's care.  Allean Haas PharmD Clinical Pharmacist 02/09/2024

## 2024-02-09 NOTE — Progress Notes (Signed)
 " [facility]  ROUNDING NOTE   Subjective:   68 year old female with past medical history of ESRD on HD MTWF at [facility], hypertension, hyperlipidemia, diabetes mellitus type 2, anemia chronic kidney disease, secondary hyperparathyroidism who came in with URI symptoms and tested positive for the flu.  Update: Patient was seen and evaluated during hemodialysis treatment today.  Diagnosed with the flu.  Objective:  Vital signs in last 24 hours:  Temp:  [97.5 F (36.4 C)-98.3 F (36.8 C)] 97.6 F (36.4 C) (12/19 1615) Pulse Rate:  [62-79] 79 (12/19 1615) Resp:  [13-21] 16 (12/19 1615) BP: (131-189)/(60-87) 168/83 (12/19 1615) SpO2:  [96 %-100 %] 100 % (12/19 1615) Weight:  [91 kg-93.5 kg] 93.5 kg (12/19 1107)  Weight change:  Filed Weights   02/08/24 0805 02/09/24 0500 02/09/24 1107  Weight: 90.7 kg 91 kg 93.5 kg    Intake/Output: No intake/output data recorded.   Intake/Output this shift:  Total I/O In: 240 [P.O.:240] Out: 1500 [Other:1500]  Physical Exam: General: No acute distress  Head: Normocephalic, atraumatic. Moist oral mucosal membranes  Neck: Supple  Lungs:  Clear to auscultation, normal effort  Heart: S1S2 no rubs  Abdomen:  Soft, nontender, bowel sounds present  Extremities: Trace peripheral edema.  Neurologic: Awake, alert, following commands  Skin: No acute rash  Access: Right IJ PermCath    Basic Metabolic Panel: Recent Labs  Lab 02/08/24 0828 02/08/24 0845 02/09/24 0620  NA 131*  --  127*  K 4.4  --  5.7*  CL 91*  --  87*  CO2 20*  --  20*  GLUCOSE 125*  --  331*  BUN 64*  --  82*  CREATININE 4.67*  --  5.32*  CALCIUM  6.5*  --  6.6*  MG  --  1.9  --   PHOS  --   --  6.9*    Liver Function Tests: Recent Labs  Lab 02/08/24 1825 02/09/24 0620  ALBUMIN  3.5 3.5   No results for input(s): LIPASE, AMYLASE in the last 168 hours. No results for input(s): AMMONIA in the last 168 hours.  CBC: Recent Labs  Lab  02/08/24 0828 02/09/24 0620 02/09/24 1200  WBC 5.2 3.7* 5.2  HGB 11.1* 10.5* 11.0*  HCT 34.4* 32.9* 33.7*  MCV 91.2 90.1 88.7  PLT 133* 129* 155    Cardiac Enzymes: No results for input(s): CKTOTAL, CKMB, CKMBINDEX, TROPONINI in the last 168 hours.  BNP: Invalid input(s): POCBNP  CBG: Recent Labs  Lab 02/09/24 0834 02/09/24 1707  GLUCAP 295* 215*    Microbiology: Results for orders placed or performed during the hospital encounter of 02/08/24  Resp panel by RT-PCR (RSV, Flu A&B, Covid) Anterior Nasal Swab     Status: Abnormal   Collection Time: 02/08/24  8:45 AM   Specimen: Anterior Nasal Swab  Result Value Ref Range Status   SARS Coronavirus 2 by RT PCR NEGATIVE NEGATIVE Final    Comment: (NOTE) SARS-CoV-2 target nucleic acids are NOT DETECTED.  The SARS-CoV-2 RNA is generally detectable in upper respiratory specimens during the acute phase of infection. The lowest concentration of SARS-CoV-2 viral copies this assay can detect is 138 copies/mL. A negative result does not preclude SARS-Cov-2 infection and should not be used as the sole basis for treatment or other patient management decisions. A negative result may occur with  improper specimen collection/handling, submission of specimen other than nasopharyngeal swab, presence of viral mutation(s) within the areas targeted by this assay, and inadequate number  of viral copies(<138 copies/mL). A negative result must be combined with clinical observations, patient history, and epidemiological information. The expected result is Negative.  Fact Sheet for Patients:  bloggercourse.com  Fact Sheet for Healthcare Providers:  seriousbroker.it  This test is no t yet approved or cleared by the [place]  FDA and  has been authorized for detection and/or diagnosis of SARS-CoV-2 by FDA under an Emergency Use Authorization (EUA). This EUA will remain  in effect  (meaning this test can be used) for the duration of the COVID-19 declaration under Section 564(b)(1) of the Act, 21 U.S.C.section 360bbb-3(b)(1), unless the authorization is terminated  or revoked sooner.       Influenza A by PCR POSITIVE (A) NEGATIVE Final   Influenza B by PCR NEGATIVE NEGATIVE Final    Comment: (NOTE) The Xpert Xpress SARS-CoV-2/FLU/RSV plus assay is intended as an aid in the diagnosis of influenza from Nasopharyngeal swab specimens and should not be used as a sole basis for treatment. Nasal washings and aspirates are unacceptable for Xpert Xpress SARS-CoV-2/FLU/RSV testing.  Fact Sheet for Patients: bloggercourse.com  Fact Sheet for Healthcare Providers: seriousbroker.it  This test is not yet approved or cleared by the [place]  FDA and has been authorized for detection and/or diagnosis of SARS-CoV-2 by FDA under an Emergency Use Authorization (EUA). This EUA will remain in effect (meaning this test can be used) for the duration of the COVID-19 declaration under Section 564(b)(1) of the Act, 21 U.S.C. section 360bbb-3(b)(1), unless the authorization is terminated or revoked.     Resp Syncytial Virus by PCR NEGATIVE NEGATIVE Final    Comment: (NOTE) Fact Sheet for Patients: bloggercourse.com  Fact Sheet for Healthcare Providers: seriousbroker.it  This test is not yet approved or cleared by the [place]  FDA and has been authorized for detection and/or diagnosis of SARS-CoV-2 by FDA under an Emergency Use Authorization (EUA). This EUA will remain in effect (meaning this test can be used) for the duration of the COVID-19 declaration under Section 564(b)(1) of the Act, 21 U.S.C. section 360bbb-3(b)(1), unless the authorization is terminated or revoked.  Performed at [facility], [address] [place], [place] [address]   Blood Culture  (routine x 2)     Status: None (Preliminary result)   Collection Time: 02/08/24  8:45 AM   Specimen: BLOOD  Result Value Ref Range Status   Specimen Description BLOOD BLOOD LEFT HAND  Final   Special Requests   Final    BOTTLES DRAWN AEROBIC AND ANAEROBIC Blood Culture adequate volume   Culture   Final    NO GROWTH < 24 HOURS Performed at [facility], [address], [place], [place] [address]    Report Status PENDING  Incomplete  Blood Culture (routine x 2)     Status: None (Preliminary result)   Collection Time: 02/08/24  8:50 AM   Specimen: BLOOD  Result Value Ref Range Status   Specimen Description BLOOD BLOOD LEFT ARM  Final   Special Requests   Final    BOTTLES DRAWN AEROBIC AND ANAEROBIC Blood Culture results may not be optimal due to an inadequate volume of blood received in culture bottles   Culture   Final    NO GROWTH < 24 HOURS Performed at [facility], [address] [place], [place] [address]    Report Status PENDING  Incomplete  MRSA Next Gen by PCR, Nasal     Status: Abnormal   Collection Time: 02/08/24  5:02 PM  Specimen: Nasal Mucosa; Nasal Swab  Result Value Ref Range Status   MRSA by PCR Next Gen DETECTED (A) NOT DETECTED Final    Comment: RESULT CALLED TO, READ BACK BY AND VERIFIED WITH: Fleming Kitty @2057  on 02/08/24 skl (NOTE) The GeneXpert MRSA Assay (FDA approved for NASAL specimens only), is one component of a comprehensive MRSA colonization surveillance program. It is not intended to diagnose MRSA infection nor to guide or monitor treatment for MRSA infections. Test performance is not FDA approved in patients less than 22 years old. Performed at [facility], [address] [place], [place] [address]     Coagulation Studies: Recent Labs    02/08/24 0828  LABPROT 13.7  INR 1.0    Urinalysis: No results for input(s): COLORURINE, LABSPEC, PHURINE, GLUCOSEU, HGBUR, BILIRUBINUR, KETONESUR,  PROTEINUR, UROBILINOGEN, NITRITE, LEUKOCYTESUR in the last 72 hours.  Invalid input(s): APPERANCEUR    Imaging: DG Chest 2 View Result Date: 02/08/2024 CLINICAL DATA:  Dyspnea. EXAM: CHEST - 2 VIEW COMPARISON:  10/20/2023 FINDINGS: Sternotomy wires and right IJ dialysis catheter unchanged. Lungs are adequately inflated without lobar consolidation. No significant effusion. Possible mild vascular congestion. Mild stable cardiomegaly. Remainder of the exam is unchanged. IMPRESSION: Mild stable cardiomegaly with possible mild vascular congestion. Electronically Signed   By: Toribio Agreste M.D.   On: 02/08/2024 08:50     Medications:    calcium  gluconate 1,000 mg (02/09/24 1720)    benzonatate   100 mg Oral TID   budesonide  (PULMICORT ) nebulizer solution  0.5 mg Nebulization BID   bumetanide   4 mg Oral BID   carvedilol   6.25 mg Oral BID WC   Chlorhexidine  Gluconate Cloth  6 each Topical Daily   Chlorhexidine  Gluconate Cloth  6 each Topical Q0600   doxycycline   100 mg Oral Q12H   heparin  injection (subcutaneous)  5,000 Units Subcutaneous Q8H   insulin  aspart  0-6 Units Subcutaneous TID WC   insulin  glargine  10 Units Subcutaneous QHS   ipratropium-albuterol   3 mL Nebulization TID   linaclotide   145 mcg Oral QPM   losartan   50 mg Oral Daily   mupirocin  ointment  1 Application Nasal BID   pantoprazole   40 mg Oral Daily   predniSONE   40 mg Oral Q breakfast   sodium chloride  flush  3 mL Intravenous Q12H   acetaminophen  **OR** acetaminophen , labetalol , ondansetron  **OR** ondansetron  (ZOFRAN ) IV, senna-docusate  Assessment/ Plan:  68 y.o. female ESRD on HD M TTS at [facility], hypertension, upper lipidemia, diabetes mellitus type 2, anemia chronic kidney disease, secondary hyperparathyroidism, GERD who presented with upper respiratory infections and was found to have influenza virus.  1.  ESRD on HD MTWF: Patient was seen and evaluated during hemodialysis treatment today.  Right  IJ PermCath being used.  Next dialysis treatment to be scheduled for Monday.  2.  Hypertension.  Maintain the patient on carvedilol  and losartan  for hypertension control.  3.  Secondary hyperparathyroidism.  Monitor bone metabolism parameters over the course of hospitalization.  4.  Anemia of chronic disease.  Hemoglobin 11.0.  Patient received Mircera as an outpatient.  No immediate need to resume at the moment.  5.  Hyperkalemia.  Serum potassium 5.7.  Should correct with ongoing dialysis treatments.  6.  Influenza A infection.  Supportive care as per hospitalist.   LOS: 0 Juanell Saffo 12/19/20256:07 PM   "

## 2024-02-09 NOTE — Progress Notes (Signed)
 PT Cancellation Note  Patient Details Name: Katrina Ortiz MRN: [id number] DOB: 02-23-1955   Cancelled Treatment:    Reason Eval/Treat Not Completed: PT screened, no needs identified, will sign off.  Pt is bed bound and utilizes hoyer lift for all transfers/mobility.  No PT needs at this time, therapist signing off.   Fonda Simpers, PT, DPT Physical Therapist - [facility]  02/09/2024, 9:13 AM

## 2024-02-10 LAB — GLUCOSE, CAPILLARY
Glucose-Capillary: 178 mg/dL — ABNORMAL HIGH (ref 70–99)
Glucose-Capillary: 255 mg/dL — ABNORMAL HIGH (ref 70–99)
Glucose-Capillary: 226 mg/dL — ABNORMAL HIGH (ref 70–99)
Glucose-Capillary: 277 mg/dL — ABNORMAL HIGH (ref 70–99)
Glucose-Capillary: 340 mg/dL — ABNORMAL HIGH (ref 70–99)

## 2024-02-10 MED ORDER — IPRATROPIUM-ALBUTEROL 0.5-2.5 (3) MG/3ML IN SOLN: 3.0000 mL | Freq: Four times a day (QID) | RESPIRATORY_TRACT | Status: DC | PRN

## 2024-02-10 MED ORDER — INSULIN GLARGINE 100 UNIT/ML ~~LOC~~ SOLN
20.0000 [IU] | Freq: Every day | SUBCUTANEOUS | Status: DC
  Administered 2024-02-10: 20 [IU] via SUBCUTANEOUS
  Filled 2024-02-10: qty 0.2

## 2024-02-10 MED ORDER — SODIUM CHLORIDE 0.9 % IV SOLN
1.0000 g | INTRAVENOUS | Status: DC
  Administered 2024-02-10 – 2024-02-11 (×2): 1 g via INTRAVENOUS
  Filled 2024-02-10 (×2): qty 10

## 2024-02-10 NOTE — Plan of Care (Signed)
  Problem: Fluid Volume: Goal: Ability to maintain a balanced intake and output will improve Outcome: Progressing   Problem: Metabolic: Goal: Ability to maintain appropriate glucose levels will improve Outcome: Progressing   Problem: Nutritional: Goal: Maintenance of adequate nutrition will improve Outcome: Progressing

## 2024-02-10 NOTE — Progress Notes (Signed)
 " Progress Note   Patient: Katrina Ortiz FMW:[id number] DOB: Dec 31, 1955 DOA: 02/08/2024     1 DOS: the patient was seen and examined on 02/10/2024   Brief hospital course:  Ainsleigh Kakos is a 68 y.o. year old female with medical history of hypertension, hyperlipidemia, type 2 diabetes, ESRD on HD Monday, Tuesday, Wednesday, Friday presenting to the ED with worsening shortness of breath.   Patient reports not feeling well about since 4 days ago with URI symptoms and her facility tested her and she was positive for flu.  She was started on Tamiflu  on 12/15 but her symptoms have persisted.  Given her continued symptoms, she requested to go to the ED for further evaluation. She reports diffuse muscle pains, decreased appetite and malaise. Does not report fevers but states she has chills.      On arrival to the ED patient was noted to be HDS stable.  Lab work and imaging obtained.  CBC without leukocytosis, anemia at baseline, slight thrombocytopenia at 133.  BMP with mild hyponatremia at 131, chronic ESRD changes, significant hypocalcemia at 6.5.  Respiratory panel positive for influenza A.  proBNP checked and greater than 35,000.  Procalcitonin slightly elevated at 0.69.  Lactic acid normal.  Blood cultures obtained.  Chest x-ray shows vascular congestion but no opacity.  Patient given treatment with DuoNeb but given continued respiratory distress, TRH contacted for admission.     Assessment and Plan:  COPD with acute exacerbation Most likely triggered by acute influenza infection Continue systemic and inhaled steroids, bronchodilator therapy CT scan of the chest without contrast shows Centrilobular ground glass nodules in the bilateral lower lobes and lingula,compatible with small airway infection/inflammation. Round focus of consolidation in the posterior right lower lobe with associated trace right pleural effusion, favored to represent round atelectasis. Follow-up in 3 months is recommended  to establish stability. Will add IV Rocephin  and continue doxycycline  to complete a 3 to 5-day course of therapy Continue oxygen supplementation to maintain pulse oximetry greater than 92%     Acute on chronic diastolic dysfunction CHF:  Patient's volume status is primarily managed with hemodialysis.   Continue 4 mg bumetanide  twice daily, carvedilol  and losartan  Continue scheduled renal replacement therapy     Diabetes mellitus with complications of ESRD:  Hyperglycemia is secondary to systemic steroids Increase Lantus  to 20 units daily Pt on HD on M/Tue/W/F. Nephrology consult for renal replacement therapy      Hypertension:  Continue carvedilol  and losartan      CAD/Hx of CABG:  Continue carvedilol  and Zetia      Hyperkalemia Expect improvement following renal replacement therapy     Hyponatremia: Most likely secondary to volume overload Expect improvement in sodium levels following dialysis       GERD:  Continue home PPI.      Hypocalcemia Supplement calcium           Subjective: Continues to complain of feeling unwell  Physical Exam: Vitals:   02/10/24 0213 02/10/24 0537 02/10/24 0719 02/10/24 0819  BP: (!) 166/91 (!) 194/86 (!) 202/71 (!) 166/90  Pulse: 77 78 76 73  Resp:  16    Temp:  98.1 F (36.7 C)    TempSrc:  Oral  Oral  SpO2:  98%  100%  Weight:      Height:       Gen: Chronically ill appearing female appears older than stated age.  HENT: NCAT CV: normal heart sounds Lung: diffuse rhonchi present Abd: No TTP, normal bowel sounds MSK:  No asymmetry, good bulk and tone Neuro: alert and oriented   Data Reviewed:  There are no new results to review at this time.  Family Communication: Plan of care discussed with patient in detail.  She requests to be seen by ENT.  Explained to patient that ENT is only available as outpatient and she can see them on Monday upon discharge  Disposition: Status is: Inpatient Remains inpatient  appropriate because: For discharge after dialysis on Monday  Planned Discharge Destination: Skilled nursing facility    Time spent: 40 minutes  Author: Aimee Somerset, MD 02/10/2024 12:42 PM  For on call review [email or web address].  "

## 2024-02-10 NOTE — Progress Notes (Signed)
 " [facility]  PROGRESS NOTE   Subjective:   Patient seen at bedside.  Denies any chest pain or shortness of breath.  Objective:  Vital signs: Blood pressure (!) 158/104, pulse 68, temperature (!) 97.3 F (36.3 C), temperature source Oral, resp. rate 18, height 5' 3 (1.6 m), weight 93.5 kg, SpO2 100%.  Intake/Output Summary (Last 24 hours) at 02/10/2024 1427 Last data filed at 02/10/2024 1300 Gross per 24 hour  Intake 120 ml  Output 1500 ml  Net -1380 ml   Filed Weights   02/08/24 0805 02/09/24 0500 02/09/24 1107  Weight: 90.7 kg 91 kg 93.5 kg     Physical Exam: General:  No acute distress  Head:  Normocephalic, atraumatic. Moist oral mucosal membranes  Eyes:  Anicteric  Neck:  Supple  Lungs:   Clear to auscultation, normal effort  Heart:  S1S2 no rubs  Abdomen:   Soft, nontender, bowel sounds present  Extremities:  peripheral edema.  Neurologic:  Awake, alert, following commands  Skin:  No lesions  Access:     Basic Metabolic Panel: Recent Labs  Lab 02/08/24 0828 02/08/24 0845 02/09/24 0620  NA 131*  --  127*  K 4.4  --  5.7*  CL 91*  --  87*  CO2 20*  --  20*  GLUCOSE 125*  --  331*  BUN 64*  --  82*  CREATININE 4.67*  --  5.32*  CALCIUM  6.5*  --  6.6*  MG  --  1.9  --   PHOS  --   --  6.9*   GFR: Estimated Creatinine Clearance: 10.8 mL/min (A) (by C-G formula based on SCr of 5.32 mg/dL (H)).  Liver Function Tests: Recent Labs  Lab 02/08/24 1825 02/09/24 0620  ALBUMIN  3.5 3.5   No results for input(s): LIPASE, AMYLASE in the last 168 hours. No results for input(s): AMMONIA in the last 168 hours.  CBC: Recent Labs  Lab 02/08/24 0828 02/09/24 0620 02/09/24 1200  WBC 5.2 3.7* 5.2  HGB 11.1* 10.5* 11.0*  HCT 34.4* 32.9* 33.7*  MCV 91.2 90.1 88.7  PLT 133* 129* 155     HbA1C: Hgb A1c MFr Bld  Date/Time Value Ref Range Status  02/09/2024 12:00 PM 8.0 (H) 4.8 - 5.6 % Final    Comment:    (NOTE) Diagnosis of  Diabetes The following HbA1c ranges recommended by the American Diabetes Association (ADA) may be used as an aid in the diagnosis of diabetes mellitus.  Hemoglobin             Suggested A1C NGSP%              Diagnosis  <5.7                   Non Diabetic  5.7-6.4                Pre-Diabetic  >6.4                   Diabetic  <7.0                   Glycemic control for                       adults with diabetes.    08/30/2023 04:22 AM 6.7 (H) 4.8 - 5.6 % Final    Comment:    (NOTE)         Prediabetes: 5.7 - 6.4  Diabetes: >6.4         Glycemic control for adults with diabetes: <7.0     Urinalysis: No results for input(s): COLORURINE, LABSPEC, PHURINE, GLUCOSEU, HGBUR, BILIRUBINUR, KETONESUR, PROTEINUR, UROBILINOGEN, NITRITE, LEUKOCYTESUR in the last 72 hours.  Invalid input(s): APPERANCEUR    Imaging: CT CHEST WO CONTRAST Result Date: 02/09/2024 EXAM: CT CHEST WITHOUT CONTRAST 02/09/2024 04:55:47 PM TECHNIQUE: CT of the chest was performed without the administration of intravenous contrast. Multiplanar reformatted images are provided for review. Automated exposure control, iterative reconstruction, and/or weight based adjustment of the mA/kV was utilized to reduce the radiation dose to as low as reasonably achievable. COMPARISON: Comparison with x-ray 02/08/2024 and CT 04/14/2021. CLINICAL HISTORY: Pneumonia suspected, uncomplicated, no prior imaging (Ped >= 86mo). FINDINGS: MEDIASTINUM: Right IJ CVC tip in the right atrium. Coronary artery and aortic atherosclerotic calcification. Sternotomy. The central airways are clear. LYMPH NODES: Borderline mediastinal lymphadenopathy. For example, the precarinal node on series 2, image 51, measures 11 mm. This is not substantially changed from 04/14/2021. Evaluation for hilar lymphadenopathy is limited without IV contrast. LUNGS AND PLEURA: A round focus of consolidation in the posterior right lower lobe with  associated trace right pleural effusion is favored to represent round atelectasis. Follow-up in 3 months is recommended to establish stability. Centrilobular ground glass nodules in the bilateral lower lobes and lingula compatible with small airway infection/inflammation. No pneumothorax. SOFT TISSUES/BONES: Sternotomy. No acute abnormality of the bones or soft tissues. UPPER ABDOMEN: Limited images of the upper abdomen demonstrates no acute abnormality. IMPRESSION: 1. Centrilobular ground glass nodules in the bilateral lower lobes and lingula, compatible with small airway infection/inflammation. 2. Round focus of consolidation in the posterior right lower lobe with associated trace right pleural effusion, favored to represent round atelectasis. Follow-up in 3 months is recommended to establish stability. Electronically signed by: Norman Gatlin MD 02/09/2024 09:32 PM EST RP Workstation: [id number]     Medications:    cefTRIAXone  (ROCEPHIN )  IV 1 g (02/10/24 1033)    benzonatate   100 mg Oral TID   budesonide  (PULMICORT ) nebulizer solution  0.5 mg Nebulization BID   bumetanide   4 mg Oral BID   carvedilol   6.25 mg Oral BID WC   Chlorhexidine  Gluconate Cloth  6 each Topical Daily   Chlorhexidine  Gluconate Cloth  6 each Topical Q0600   doxycycline   100 mg Oral Q12H   heparin  injection (subcutaneous)  5,000 Units Subcutaneous Q8H   insulin  aspart  0-6 Units Subcutaneous TID WC   insulin  glargine  20 Units Subcutaneous QHS   ipratropium-albuterol   3 mL Nebulization TID   linaclotide   145 mcg Oral QPM   losartan   50 mg Oral Daily   mupirocin  ointment  1 Application Nasal BID   pantoprazole   40 mg Oral Daily   predniSONE   40 mg Oral Q breakfast   sodium chloride  flush  3 mL Intravenous Q12H    Assessment/ Plan:     68 year old female with history of end-stage renal disease on dialysis, hypertension, hyperlipidemia, diabetes, anemia and secondary hyperparathyroidism now admitted with history of  shortness of breath and found to have pneumonia/COPD exacerbation.  #1: ESRD: Patient was dialyzed yesterday and tolerated fluid removal.  #2: Anemia: Continue to monitor closely.  #3: COPD exacerbation/pneumonia: Continue with inhalers, prednisone  and doxycycline  along with ceftriaxone .  #4: Hypertension/fluid overload: Continue Bumex  as ordered.  Will also continue losartan  and carvedilol .  #5: Secondary hyperparathyroidism: Will monitor PTH, calcium  and phosphorus levels.  Labs and medications reviewed. Will continue  to follow along with you.   LOS: 1 Pinkey Edman, MD [facility] 12/20/20252:27 PM  "

## 2024-02-11 ENCOUNTER — Inpatient Hospital Stay

## 2024-02-11 DIAGNOSIS — N186 End stage renal disease: Secondary | ICD-10-CM

## 2024-02-11 DIAGNOSIS — J441 Chronic obstructive pulmonary disease with (acute) exacerbation: Secondary | ICD-10-CM

## 2024-02-11 DIAGNOSIS — J111 Influenza due to unidentified influenza virus with other respiratory manifestations: Secondary | ICD-10-CM

## 2024-02-11 DIAGNOSIS — R0600 Dyspnea, unspecified: Secondary | ICD-10-CM

## 2024-02-11 DIAGNOSIS — J101 Influenza due to other identified influenza virus with other respiratory manifestations: Secondary | ICD-10-CM | POA: Diagnosis not present

## 2024-02-11 LAB — BASIC METABOLIC PANEL WITH GFR
Anion gap: 20 — ABNORMAL HIGH (ref 5–15)
BUN: 73 mg/dL — ABNORMAL HIGH (ref 8–23)
CO2: 19 mmol/L — ABNORMAL LOW (ref 22–32)
Calcium: 7.3 mg/dL — ABNORMAL LOW (ref 8.9–10.3)
Chloride: 89 mmol/L — ABNORMAL LOW (ref 98–111)
Creatinine, Ser: 4.07 mg/dL — ABNORMAL HIGH (ref 0.44–1.00)
GFR, Estimated: 11 mL/min — ABNORMAL LOW
Glucose, Bld: 277 mg/dL — ABNORMAL HIGH (ref 70–99)
Potassium: 5.4 mmol/L — ABNORMAL HIGH (ref 3.5–5.1)
Sodium: 128 mmol/L — ABNORMAL LOW (ref 135–145)

## 2024-02-11 LAB — GLUCOSE, CAPILLARY
Glucose-Capillary: 248 mg/dL — ABNORMAL HIGH (ref 70–99)
Glucose-Capillary: 197 mg/dL — ABNORMAL HIGH (ref 70–99)
Glucose-Capillary: 172 mg/dL — ABNORMAL HIGH (ref 70–99)
Glucose-Capillary: 152 mg/dL — ABNORMAL HIGH (ref 70–99)
Glucose-Capillary: 152 mg/dL — ABNORMAL HIGH (ref 70–99)

## 2024-02-11 LAB — CBC
HCT: 38.6 % (ref 36.0–46.0)
Hemoglobin: 12.8 g/dL (ref 12.0–15.0)
MCH: 29.4 pg (ref 26.0–34.0)
MCHC: 33.2 g/dL (ref 30.0–36.0)
MCV: 88.7 fL (ref 80.0–100.0)
Platelets: 130 K/uL — ABNORMAL LOW (ref 150–400)
RBC: 4.35 MIL/uL (ref 3.87–5.11)
RDW: 13.2 % (ref 11.5–15.5)
WBC: 5.5 K/uL (ref 4.0–10.5)
nRBC: 0 % (ref 0.0–0.2)

## 2024-02-11 LAB — TROPONIN T, HIGH SENSITIVITY
Troponin T High Sensitivity: 65 ng/L — ABNORMAL HIGH (ref 0–19)
Troponin T High Sensitivity: 80 ng/L — ABNORMAL HIGH (ref 0–19)
Troponin T High Sensitivity: 80 ng/L — ABNORMAL HIGH (ref 0–19)
Troponin T High Sensitivity: 83 ng/L — ABNORMAL HIGH (ref 0–19)

## 2024-02-11 MED ORDER — ONDANSETRON HCL 4 MG/2ML IJ SOLN
INTRAMUSCULAR | Status: AC
  Filled 2024-02-11: qty 2

## 2024-02-11 MED ORDER — SODIUM ZIRCONIUM CYCLOSILICATE 10 G PO PACK
10.0000 g | PACK | Freq: Every day | ORAL | Status: DC
  Filled 2024-02-11: qty 1

## 2024-02-11 MED ORDER — HYDRALAZINE HCL 20 MG/ML IJ SOLN
5.0000 mg | Freq: Once | INTRAMUSCULAR | Status: DC
  Filled 2024-02-11: qty 0.25

## 2024-02-11 MED ORDER — HEPARIN SODIUM (PORCINE) 1000 UNIT/ML IJ SOLN
INTRAMUSCULAR | Status: AC
  Filled 2024-02-11: qty 4

## 2024-02-11 MED ORDER — INSULIN GLARGINE 100 UNIT/ML ~~LOC~~ SOLN
20.0000 [IU] | Freq: Every day | SUBCUTANEOUS | Status: DC
  Administered 2024-02-11 – 2024-02-13 (×3): 20 [IU] via SUBCUTANEOUS
  Filled 2024-02-11 (×3): qty 0.2

## 2024-02-11 MED ORDER — METOCLOPRAMIDE HCL 5 MG/ML IJ SOLN
10.0000 mg | Freq: Once | INTRAMUSCULAR | Status: AC
  Administered 2024-02-11: 10 mg via INTRAVENOUS
  Filled 2024-02-11: qty 2

## 2024-02-11 MED ORDER — HYDROXYZINE HCL 25 MG PO TABS
25.0000 mg | ORAL_TABLET | Freq: Three times a day (TID) | ORAL | Status: DC | PRN
  Administered 2024-02-11 – 2024-02-13 (×3): 25 mg via ORAL
  Filled 2024-02-11 (×3): qty 1

## 2024-02-11 MED ORDER — ACETAMINOPHEN 325 MG PO TABS
ORAL_TABLET | ORAL | Status: AC
  Filled 2024-02-11: qty 2

## 2024-02-11 MED ORDER — INSULIN GLARGINE 100 UNIT/ML ~~LOC~~ SOLN
30.0000 [IU] | Freq: Every day | SUBCUTANEOUS | Status: DC
  Filled 2024-02-11: qty 0.3

## 2024-02-11 NOTE — Progress Notes (Addendum)
 " Progress Note   Patient: Katrina Ortiz DOB: 08-08-1955 DOA: 02/08/2024     2 DOS: the patient was seen and examined on 02/11/2024   Brief hospital course:  Katrina Ortiz is a 68 y.o. year old female with medical history of hypertension, hyperlipidemia, type 2 diabetes, ESRD on HD Monday, Tuesday, Wednesday, Friday presenting to the ED with worsening shortness of breath.   Patient reports not feeling well about since 4 days ago with URI symptoms and her facility tested her and she was positive for flu.  She was started on Tamiflu  on 12/15 but her symptoms have persisted.  Given her continued symptoms, she requested to go to the ED for further evaluation. She reports diffuse muscle pains, decreased appetite and malaise. Does not report fevers but states she has chills.      On arrival to the ED patient was noted to be HDS stable.  Lab work and imaging obtained.  CBC without leukocytosis, anemia at baseline, slight thrombocytopenia at 133.  BMP with mild hyponatremia at 131, chronic ESRD changes, significant hypocalcemia at 6.5.  Respiratory panel positive for influenza A.  proBNP checked and greater than 35,000.  Procalcitonin slightly elevated at 0.69.  Lactic acid normal.  Blood cultures obtained.  Chest x-ray shows vascular congestion but no opacity.  Patient given treatment with DuoNeb but given continued respiratory distress, [facility] contacted for admission.   12/21-rapid response was called and dialysis.  Patient was said to have transient LOC after an episode of emesis.  Awake, alert but very lethargic upon arrival.  Noted to be hypotensive in the dialysis unit.  Received 450 mL of IV fluid hydration as well as Reglan  10 mg IV push. Treatment discontinued. Transferred back to medical floor     Assessment and Plan:   Syncope Transient hypotension Nausea/vomiting Elevated troponin Most likely vasovagal Blood pressure improved following IV fluid hydration Continue  supportive care with antiemetics Obtain CT scan of abdomen and pelvis for further evaluation of abdominal pain Twelve-lead EKG was normal sinus rhythm with no acute ST or T wave changes Initial troponin elevated at 65.  Will trend   COPD with acute exacerbation Most likely triggered by acute influenza infection Patient has completed a course of Tamiflu  with Continue systemic and inhaled steroids, bronchodilator therapy CT scan of the chest without contrast shows Centrilobular ground glass nodules in the bilateral lower lobes and lingula,compatible with small airway infection/inflammation. Round focus of consolidation in the posterior right lower lobe with associated trace right pleural effusion, favored to represent round atelectasis. Follow-up in 3 months is recommended to establish stability. Continue doxycycline  to complete a 5-day course of therapy Will discontinue Rocephin  due to allergy Continue oxygen supplementation to maintain pulse oximetry greater than 92%     Acute on chronic diastolic dysfunction CHF:  Patient's volume status is primarily managed with hemodialysis. Hold Bumex  Continue carvedilol  and losartan  as blood pressure tolerates Continue scheduled renal replacement therapy     Diabetes mellitus with complications of ESRD:  Hyperglycemia is secondary to systemic steroids Increase Lantus  to 20 units daily Pt on HD on M/Tue/W/F. Nephrology consult for renal replacement therapy       Hypertension:  Continue carvedilol  and losartan      CAD/Hx of CABG:  Continue carvedilol  and Zetia      Hyperkalemia Expect improvement following renal replacement therapy     Hyponatremia: Most likely secondary to volume overload Expect improvement in sodium levels following dialysis       GERD:  Continue home PPI.      Hypocalcemia Supplement calcium      Obesity BMI 35.50 Complicates overall prognosis and care         Subjective: Dry heaving in dialysis.   Complains of generalized abdominal pain  Physical Exam: Vitals:   02/11/24 1030 02/11/24 1100 02/11/24 1115 02/11/24 1206  BP: 114/69 112/62 (!) 98/41 115/66  Pulse: 62 65 68 66  Resp: 13 11 (!) 24 14  Temp:    97.6 F (36.4 C)  TempSrc:    Oral  SpO2: 99% 100% 100% 100%  Weight:      Height:       Gen: Lethargic but arouses to loud name call HENT: NCAT CV: normal heart sounds Lung: diffuse rhonchi present Abd: Diffusely tender, soft , normal bowel sounds MSK: No asymmetry, good bulk and tone Neuro: alert and oriented  Data Reviewed: Labs reviewed.  Sodium 128, potassium 5.4, BUN 73, creatinine 4.07 Labs reviewed  Family Communication: Called and spoke to patient's son, Mr Arthella Headings over the phone.  All questions and concerns have been addressed.  He verbalizes understanding and agrees with the plan.  Disposition: Status is: Inpatient Remains inpatient appropriate because: Symptom control  Planned Discharge Destination: Skilled nursing facility    Time spent: 55 minutes  Author: Aimee Somerset, MD 02/11/2024 1:48 PM  For on call review [email or web address].  "

## 2024-02-11 NOTE — Progress Notes (Signed)
" °   02/11/24 1200  Spiritual Encounters  Type of Visit Initial  Care provided to: Pt not available  Conversation partners present during encounter Nurse  Referral source Code page  Reason for visit Code  OnCall Visit Yes   Chaplain responded to rapid response. Chaplain spoke with nurse upon arrival and no family is present and no needs are identified at this time. Chaplain is available for follow up as needed.  "

## 2024-02-11 NOTE — Progress Notes (Signed)
 Patient receiving hemodialysis. Blood pressure decreased to 98/41. Patient responsive to pain only. Patient given saline bolus 200cc per Dr Aimee. Patient treatment ended per Dr Dominica. Patient blood pressure is now 212/80. Complaints of nausea and stomach pain for which she received Reglan  10 mg IV. Patient to receive IV antihypertensive once she returns to her assigned floor. Patient is resting upon discharge from hemodialysis. Patient expresses relief from nausea at this time.

## 2024-02-11 NOTE — Progress Notes (Signed)
 " [facility]  PROGRESS NOTE   Subjective:   Patient seen at dialysis.  Still complains of nausea but also became unstable on dialysis.  She is now off of the machine  Objective:  Vital signs: Blood pressure 112/62, pulse 65, temperature 97.7 F (36.5 C), temperature source Oral, resp. rate 11, height 5' 3 (1.6 m), weight 90.9 kg, SpO2 100%.  Intake/Output Summary (Last 24 hours) at 02/11/2024 1114 Last data filed at 02/10/2024 2100 Gross per 24 hour  Intake 240 ml  Output 600 ml  Net -360 ml   Filed Weights   02/09/24 1107 02/11/24 0511 02/11/24 0830  Weight: 93.5 kg 91 kg 90.9 kg     Physical Exam: General:  No acute distress  Head:  Normocephalic, atraumatic. Moist oral mucosal membranes  Eyes:  Anicteric  Neck:  Supple  Lungs:   Clear to auscultation, normal effort  Heart:  S1S2 no rubs  Abdomen:   Soft, nontender, bowel sounds present  Extremities:  peripheral edema.  Neurologic:  Awake, alert, following commands  Skin:  No lesions  Access:     Basic Metabolic Panel: Recent Labs  Lab 02/08/24 0828 02/08/24 0845 02/09/24 0620 02/11/24 0522  NA 131*  --  127* 128*  K 4.4  --  5.7* 5.4*  CL 91*  --  87* 89*  CO2 20*  --  20* 19*  GLUCOSE 125*  --  331* 277*  BUN 64*  --  82* 73*  CREATININE 4.67*  --  5.32* 4.07*  CALCIUM  6.5*  --  6.6* 7.3*  MG  --  1.9  --   --   PHOS  --   --  6.9*  --    GFR: Estimated Creatinine Clearance: 14 mL/min (A) (by C-G formula based on SCr of 4.07 mg/dL (H)).  Liver Function Tests: Recent Labs  Lab 02/08/24 1825 02/09/24 0620  ALBUMIN  3.5 3.5   No results for input(s): LIPASE, AMYLASE in the last 168 hours. No results for input(s): AMMONIA in the last 168 hours.  CBC: Recent Labs  Lab 02/08/24 0828 02/09/24 0620 02/09/24 1200 02/11/24 0522  WBC 5.2 3.7* 5.2 5.5  HGB 11.1* 10.5* 11.0* 12.8  HCT 34.4* 32.9* 33.7* 38.6  MCV 91.2 90.1 88.7 88.7  PLT 133* 129* 155 130*     HbA1C: Hgb  A1c MFr Bld  Date/Time Value Ref Range Status  02/09/2024 12:00 PM 8.0 (H) 4.8 - 5.6 % Final    Comment:    (NOTE) Diagnosis of Diabetes The following HbA1c ranges recommended by the American Diabetes Association (ADA) may be used as an aid in the diagnosis of diabetes mellitus.  Hemoglobin             Suggested A1C NGSP%              Diagnosis  <5.7                   Non Diabetic  5.7-6.4                Pre-Diabetic  >6.4                   Diabetic  <7.0                   Glycemic control for                       adults with diabetes.  08/30/2023 04:22 AM 6.7 (H) 4.8 - 5.6 % Final    Comment:    (NOTE)         Prediabetes: 5.7 - 6.4         Diabetes: >6.4         Glycemic control for adults with diabetes: <7.0     Urinalysis: No results for input(s): COLORURINE, LABSPEC, PHURINE, GLUCOSEU, HGBUR, BILIRUBINUR, KETONESUR, PROTEINUR, UROBILINOGEN, NITRITE, LEUKOCYTESUR in the last 72 hours.  Invalid input(s): APPERANCEUR    Imaging: CT CHEST WO CONTRAST Result Date: 02/09/2024 EXAM: CT CHEST WITHOUT CONTRAST 02/09/2024 04:55:47 PM TECHNIQUE: CT of the chest was performed without the administration of intravenous contrast. Multiplanar reformatted images are provided for review. Automated exposure control, iterative reconstruction, and/or weight based adjustment of the mA/kV was utilized to reduce the radiation dose to as low as reasonably achievable. COMPARISON: Comparison with x-ray 02/08/2024 and CT 04/14/2021. CLINICAL HISTORY: Pneumonia suspected, uncomplicated, no prior imaging (Ped >= 62mo). FINDINGS: MEDIASTINUM: Right IJ CVC tip in the right atrium. Coronary artery and aortic atherosclerotic calcification. Sternotomy. The central airways are clear. LYMPH NODES: Borderline mediastinal lymphadenopathy. For example, the precarinal node on series 2, image 51, measures 11 mm. This is not substantially changed from 04/14/2021. Evaluation for hilar  lymphadenopathy is limited without IV contrast. LUNGS AND PLEURA: A round focus of consolidation in the posterior right lower lobe with associated trace right pleural effusion is favored to represent round atelectasis. Follow-up in 3 months is recommended to establish stability. Centrilobular ground glass nodules in the bilateral lower lobes and lingula compatible with small airway infection/inflammation. No pneumothorax. SOFT TISSUES/BONES: Sternotomy. No acute abnormality of the bones or soft tissues. UPPER ABDOMEN: Limited images of the upper abdomen demonstrates no acute abnormality. IMPRESSION: 1. Centrilobular ground glass nodules in the bilateral lower lobes and lingula, compatible with small airway infection/inflammation. 2. Round focus of consolidation in the posterior right lower lobe with associated trace right pleural effusion, favored to represent round atelectasis. Follow-up in 3 months is recommended to establish stability. Electronically signed by: Norman Gatlin MD 02/09/2024 09:32 PM EST RP Workstation: [id number]     Medications:    cefTRIAXone  (ROCEPHIN )  IV 1 g (02/10/24 1033)    benzonatate   100 mg Oral TID   bumetanide   4 mg Oral BID   carvedilol   6.25 mg Oral BID WC   Chlorhexidine  Gluconate Cloth  6 each Topical Daily   Chlorhexidine  Gluconate Cloth  6 each Topical Q0600   doxycycline   100 mg Oral Q12H   heparin  injection (subcutaneous)  5,000 Units Subcutaneous Q8H   insulin  aspart  0-6 Units Subcutaneous TID WC   insulin  glargine  30 Units Subcutaneous QHS   linaclotide   145 mcg Oral QPM   losartan   50 mg Oral Daily   mupirocin  ointment  1 Application Nasal BID   pantoprazole   40 mg Oral Daily   predniSONE   40 mg Oral Q breakfast   sodium chloride  flush  3 mL Intravenous Q12H    Assessment/ Plan:     68 year old female with history of end-stage renal disease on dialysis, hypertension, hyperlipidemia, diabetes, anemia and secondary hyperparathyroidism now admitted  with history of shortness of breath and found to have pneumonia/COPD exacerbation.  She was treated with Tamiflu .   #1: ESRD: Patient is on dialysis at this time.  Patient was diagnosed with admission secondary to rapid response with low blood pressure and severe nausea.     #2: Anemia: Continue to monitor closely.   #  3: COPD exacerbation/pneumonia: She finished a course of Tamiflu .  Continue with inhalers, prednisone  and doxycycline  along with ceftriaxone .   #4: Hypertension/fluid overload: Continue Bumex  as ordered.  Will also continue losartan  and carvedilol .   #5: Secondary hyperparathyroidism: Will monitor PTH, calcium  and phosphorus levels.  #6: Nausea: Continue with Zofran .  She may need to be started on Reglan  for diabetic gastroparesis.  #7 alcoholic diabetes: Continue insulin  as per protocol.  Labs and medications reviewed. Will continue to follow along with you.   LOS: 2 Pinkey Edman, MD [facility] 12/21/202511:14 AM  "

## 2024-02-11 NOTE — Progress Notes (Signed)
 Ceftriaxone  stopped after 8 minutes of infusion due to patient   complaints of generalized itching. MD and pharmacy informed.

## 2024-02-11 NOTE — Significant Event (Signed)
 Rapid Response Event Note   Reason for Call : Called RRT for N/V, hypotention and epigastric pain   Initial Focused Assessment: Laying in bed, nauseated, able to answer questions, see flowsheets for VS      Interventions: Dr Landry to bedside, stat EKG, trop, reglan  iv given, dialysis stopped, and pt sent back to floor.   Plan of Care: as above    Event Summary: as above  MD Notified: Agbatta 1117 Call Time:1117 Arrival Time:1118 End Time:1135  Durell Lofaso A, RN

## 2024-02-12 DIAGNOSIS — J101 Influenza due to other identified influenza virus with other respiratory manifestations: Secondary | ICD-10-CM | POA: Diagnosis not present

## 2024-02-12 LAB — CBC WITH DIFFERENTIAL/PLATELET
Abs Immature Granulocytes: 0.4 K/uL — ABNORMAL HIGH (ref 0.00–0.07)
Basophils Absolute: 0 K/uL (ref 0.0–0.1)
Basophils Relative: 1 %
Eosinophils Absolute: 0.1 K/uL (ref 0.0–0.5)
Eosinophils Relative: 1 %
HCT: 38.4 % (ref 36.0–46.0)
Hemoglobin: 12.3 g/dL (ref 12.0–15.0)
Immature Granulocytes: 5 %
Lymphocytes Relative: 9 %
Lymphs Abs: 0.7 K/uL (ref 0.7–4.0)
MCH: 28.3 pg (ref 26.0–34.0)
MCHC: 32 g/dL (ref 30.0–36.0)
MCV: 88.5 fL (ref 80.0–100.0)
Monocytes Absolute: 0.5 K/uL (ref 0.1–1.0)
Monocytes Relative: 6 %
Neutro Abs: 6.4 K/uL (ref 1.7–7.7)
Neutrophils Relative %: 78 %
Platelets: 154 K/uL (ref 150–400)
RBC: 4.34 MIL/uL (ref 3.87–5.11)
RDW: 13.6 % (ref 11.5–15.5)
WBC: 8.1 K/uL (ref 4.0–10.5)
nRBC: 0 % (ref 0.0–0.2)

## 2024-02-12 LAB — COMPREHENSIVE METABOLIC PANEL WITH GFR
ALT: 16 U/L (ref 0–44)
AST: 21 U/L (ref 15–41)
Albumin: 3.6 g/dL (ref 3.5–5.0)
Alkaline Phosphatase: 169 U/L — ABNORMAL HIGH (ref 38–126)
Anion gap: 16 — ABNORMAL HIGH (ref 5–15)
BUN: 54 mg/dL — ABNORMAL HIGH (ref 8–23)
CO2: 25 mmol/L (ref 22–32)
Calcium: 7.9 mg/dL — ABNORMAL LOW (ref 8.9–10.3)
Chloride: 92 mmol/L — ABNORMAL LOW (ref 98–111)
Creatinine, Ser: 3.69 mg/dL — ABNORMAL HIGH (ref 0.44–1.00)
GFR, Estimated: 13 mL/min — ABNORMAL LOW
Glucose, Bld: 148 mg/dL — ABNORMAL HIGH (ref 70–99)
Potassium: 4.1 mmol/L (ref 3.5–5.1)
Sodium: 133 mmol/L — ABNORMAL LOW (ref 135–145)
Total Bilirubin: 0.4 mg/dL (ref 0.0–1.2)
Total Protein: 6.4 g/dL — ABNORMAL LOW (ref 6.5–8.1)

## 2024-02-12 LAB — GLUCOSE, CAPILLARY
Glucose-Capillary: 104 mg/dL — ABNORMAL HIGH (ref 70–99)
Glucose-Capillary: 133 mg/dL — ABNORMAL HIGH (ref 70–99)
Glucose-Capillary: 134 mg/dL — ABNORMAL HIGH (ref 70–99)
Glucose-Capillary: 209 mg/dL — ABNORMAL HIGH (ref 70–99)
Glucose-Capillary: 252 mg/dL — ABNORMAL HIGH (ref 70–99)
Glucose-Capillary: 86 mg/dL (ref 70–99)

## 2024-02-12 MED ORDER — GUAIFENESIN ER 600 MG PO TB12
1200.0000 mg | ORAL_TABLET | Freq: Two times a day (BID) | ORAL | Status: DC
  Administered 2024-02-12 – 2024-02-16 (×8): 1200 mg via ORAL
  Filled 2024-02-12 (×8): qty 2

## 2024-02-12 MED ORDER — GUAIFENESIN ER 600 MG PO TB12: 1200.0000 mg | ORAL_TABLET | Freq: Two times a day (BID) | ORAL | Status: DC

## 2024-02-12 NOTE — Progress Notes (Signed)
 " [facility]  ROUNDING NOTE   Subjective:   68 year old female with past medical history of ESRD on HD MTWF at [facility], hypertension, hyperlipidemia, diabetes mellitus type 2, anemia chronic kidney disease, secondary hyperparathyroidism who came in with URI symptoms and tested positive for the flu.  Update: Patient sitting up in bed States she feels bad Nausea with poor appetite  Objective:  Vital signs in last 24 hours:  Temp:  [97.5 F (36.4 C)-98.4 F (36.9 C)] 97.9 F (36.6 C) (12/22 1107) Pulse Rate:  [62-70] 65 (12/22 1107) Resp:  [16-18] 18 (12/22 1107) BP: (156-188)/(54-85) 156/85 (12/22 1137) SpO2:  [99 %-100 %] 100 % (12/22 1107) Weight:  [90.6 kg] 90.6 kg (12/22 0448)  Weight change: -0.1 kg Filed Weights   02/11/24 0511 02/11/24 0830 02/12/24 0448  Weight: 91 kg 90.9 kg 90.6 kg    Intake/Output: I/O last 3 completed shifts: In: 480 [P.O.:480] Out: 700 [Urine:600; Other:100]   Intake/Output this shift:  Total I/O In: 360 [P.O.:360] Out: -   Physical Exam: General: No acute distress  Head: Normocephalic, atraumatic. Moist oral mucosal membranes  Lungs:  Clear to auscultation, normal effort  Heart: S1S2 no rubs  Abdomen:  Soft, nontender, bowel sounds present  Extremities: Trace peripheral edema.  Neurologic: Awake, alert, following commands  Skin: No acute rash  Access: Right IJ PermCath    Basic Metabolic Panel: Recent Labs  Lab 02/08/24 0828 02/08/24 0845 02/09/24 0620 02/11/24 0522 02/12/24 1014  NA 131*  --  127* 128* 133*  K 4.4  --  5.7* 5.4* 4.1  CL 91*  --  87* 89* 92*  CO2 20*  --  20* 19* 25  GLUCOSE 125*  --  331* 277* 148*  BUN 64*  --  82* 73* 54*  CREATININE 4.67*  --  5.32* 4.07* 3.69*  CALCIUM  6.5*  --  6.6* 7.3* 7.9*  MG  --  1.9  --   --   --   PHOS  --   --  6.9*  --   --     Liver Function Tests: Recent Labs  Lab 02/08/24 1825 02/09/24 0620 02/12/24 1014  AST  --   --  21  ALT  --   --  16   ALKPHOS  --   --  169*  BILITOT  --   --  0.4  PROT  --   --  6.4*  ALBUMIN  3.5 3.5 3.6   No results for input(s): LIPASE, AMYLASE in the last 168 hours. No results for input(s): AMMONIA in the last 168 hours.  CBC: Recent Labs  Lab 02/08/24 0828 02/09/24 0620 02/09/24 1200 02/11/24 0522 02/12/24 1014  WBC 5.2 3.7* 5.2 5.5 8.1  NEUTROABS  --   --   --   --  6.4  HGB 11.1* 10.5* 11.0* 12.8 12.3  HCT 34.4* 32.9* 33.7* 38.6 38.4  MCV 91.2 90.1 88.7 88.7 88.5  PLT 133* 129* 155 130* 154    Cardiac Enzymes: No results for input(s): CKTOTAL, CKMB, CKMBINDEX, TROPONINI in the last 168 hours.  BNP: Invalid input(s): POCBNP  CBG: Recent Labs  Lab 02/11/24 2044 02/12/24 0010 02/12/24 0449 02/12/24 0724 02/12/24 1103  GLUCAP 152* 134* 104* 86 133*    Microbiology: Results for orders placed or performed during the hospital encounter of 02/08/24  Resp panel by RT-PCR (RSV, Flu A&B, Covid) Anterior Nasal Swab     Status: Abnormal   Collection Time: 02/08/24  8:45  AM   Specimen: Anterior Nasal Swab  Result Value Ref Range Status   SARS Coronavirus 2 by RT PCR NEGATIVE NEGATIVE Final    Comment: (NOTE) SARS-CoV-2 target nucleic acids are NOT DETECTED.  The SARS-CoV-2 RNA is generally detectable in upper respiratory specimens during the acute phase of infection. The lowest concentration of SARS-CoV-2 viral copies this assay can detect is 138 copies/mL. A negative result does not preclude SARS-Cov-2 infection and should not be used as the sole basis for treatment or other patient management decisions. A negative result may occur with  improper specimen collection/handling, submission of specimen other than nasopharyngeal swab, presence of viral mutation(s) within the areas targeted by this assay, and inadequate number of viral copies(<138 copies/mL). A negative result must be combined with clinical observations, patient history, and  epidemiological information. The expected result is Negative.  Fact Sheet for Patients:  bloggercourse.com  Fact Sheet for Healthcare Providers:  seriousbroker.it  This test is no t yet approved or cleared by the [place]  FDA and  has been authorized for detection and/or diagnosis of SARS-CoV-2 by FDA under an Emergency Use Authorization (EUA). This EUA will remain  in effect (meaning this test can be used) for the duration of the COVID-19 declaration under Section 564(b)(1) of the Act, 21 U.S.C.section 360bbb-3(b)(1), unless the authorization is terminated  or revoked sooner.       Influenza A by PCR POSITIVE (A) NEGATIVE Final   Influenza B by PCR NEGATIVE NEGATIVE Final    Comment: (NOTE) The Xpert Xpress SARS-CoV-2/FLU/RSV plus assay is intended as an aid in the diagnosis of influenza from Nasopharyngeal swab specimens and should not be used as a sole basis for treatment. Nasal washings and aspirates are unacceptable for Xpert Xpress SARS-CoV-2/FLU/RSV testing.  Fact Sheet for Patients: bloggercourse.com  Fact Sheet for Healthcare Providers: seriousbroker.it  This test is not yet approved or cleared by the [place]  FDA and has been authorized for detection and/or diagnosis of SARS-CoV-2 by FDA under an Emergency Use Authorization (EUA). This EUA will remain in effect (meaning this test can be used) for the duration of the COVID-19 declaration under Section 564(b)(1) of the Act, 21 U.S.C. section 360bbb-3(b)(1), unless the authorization is terminated or revoked.     Resp Syncytial Virus by PCR NEGATIVE NEGATIVE Final    Comment: (NOTE) Fact Sheet for Patients: bloggercourse.com  Fact Sheet for Healthcare Providers: seriousbroker.it  This test is not yet approved or cleared by the [place]  FDA and has  been authorized for detection and/or diagnosis of SARS-CoV-2 by FDA under an Emergency Use Authorization (EUA). This EUA will remain in effect (meaning this test can be used) for the duration of the COVID-19 declaration under Section 564(b)(1) of the Act, 21 U.S.C. section 360bbb-3(b)(1), unless the authorization is terminated or revoked.  Performed at [facility], [address] [place], [place] [address]   Blood Culture (routine x 2)     Status: None (Preliminary result)   Collection Time: 02/08/24  8:45 AM   Specimen: BLOOD  Result Value Ref Range Status   Specimen Description BLOOD BLOOD LEFT HAND  Final   Special Requests   Final    BOTTLES DRAWN AEROBIC AND ANAEROBIC Blood Culture adequate volume   Culture   Final    NO GROWTH 4 DAYS Performed at [facility], [address] [place], [place] [address]    Report Status PENDING  Incomplete  Blood Culture (routine x 2)  Status: None (Preliminary result)   Collection Time: 02/08/24  8:50 AM   Specimen: BLOOD  Result Value Ref Range Status   Specimen Description BLOOD BLOOD LEFT ARM  Final   Special Requests   Final    BOTTLES DRAWN AEROBIC AND ANAEROBIC Blood Culture results may not be optimal due to an inadequate volume of blood received in culture bottles   Culture   Final    NO GROWTH 4 DAYS Performed at [facility], [address], [place], [place] [address]    Report Status PENDING  Incomplete  MRSA Next Gen by PCR, Nasal     Status: Abnormal   Collection Time: 02/08/24  5:02 PM   Specimen: Nasal Mucosa; Nasal Swab  Result Value Ref Range Status   MRSA by PCR Next Gen DETECTED (A) NOT DETECTED Final    Comment: RESULT CALLED TO, READ BACK BY AND VERIFIED WITH: Fleming Kitty @2057  on 02/08/24 skl (NOTE) The GeneXpert MRSA Assay (FDA approved for NASAL specimens only), is one component of a comprehensive MRSA colonization surveillance program. It is not intended to diagnose MRSA  infection nor to guide or monitor treatment for MRSA infections. Test performance is not FDA approved in patients less than 35 years old. Performed at [facility], [address] [place], [place] [address]     Coagulation Studies: No results for input(s): LABPROT, INR in the last 72 hours.   Urinalysis: No results for input(s): COLORURINE, LABSPEC, PHURINE, GLUCOSEU, HGBUR, BILIRUBINUR, KETONESUR, PROTEINUR, UROBILINOGEN, NITRITE, LEUKOCYTESUR in the last 72 hours.  Invalid input(s): APPERANCEUR    Imaging: CT ABDOMEN PELVIS WO CONTRAST Result Date: 02/11/2024 CLINICAL DATA:  Abdomen pain EXAM: CT ABDOMEN AND PELVIS WITHOUT CONTRAST TECHNIQUE: Multidetector CT imaging of the abdomen and pelvis was performed following the standard protocol without IV contrast. RADIATION DOSE REDUCTION: This exam was performed according to the departmental dose-optimization program which includes automated exposure control, adjustment of the mA and/or kV according to patient size and/or use of iterative reconstruction technique. COMPARISON:  CT 10/17/2023, 08/28/2023, 04/14/2021, 06/02/2023 FINDINGS: Lower chest: Lung bases demonstrate trace right-sided chronic pleural effusion with rim thickening. Chronic rounded area of consolidation in the right lower lobe, stable since April 2025 and favored to represent round atelectasis. Chronic scarring within the left lower lobe. Cardiomegaly and coronary vascular calcification Hepatobiliary: Sludge and gallstones. No right upper quadrant inflammation. No focal hepatic abnormality or biliary dilatation Pancreas: Unremarkable. No pancreatic ductal dilatation or surrounding inflammatory changes. Spleen: Normal in size without focal abnormality. Adrenals/Urinary Tract: Adrenal glands are normal. Mild cortical atrophy of kidneys. Nonspecific perinephric stranding. No hydronephrosis. Small focus of gas within the urinary bladder  Stomach/Bowel: Stomach within normal limits. No dilated small bowel. No acute bowel wall thickening. Negative appendix Vascular/Lymphatic: Aortic atherosclerosis. No enlarged abdominal or pelvic lymph nodes. Reproductive: Bilateral adnexal clips. Suspicion of uterine fibroids. No adnexal mass Other: No ascites or free air. Left flank edema. Skin thickening and subcutaneous infiltration of left abdominal pannus, slightly increased compared to prior. Small focus of gas within the right subcutaneous abdominal fat possibly due to recent injection Musculoskeletal: No acute osseous abnormality. 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 No CT evidence for acute intra-abdominal or pelvic abnormality. 2. Chronic trace right-sided pleural effusion with rim thickening and chronic rounded area of consolidation in the right lower lobe, favored to represent round atelectasis. 3. Gallstones and sludge but no right upper quadrant inflammation. 4. Small focus of gas within the urinary  bladder, correlate for recent instrumentation. 5. Skin thickening and subcutaneous infiltration of left lateral abdominal pannus, slightly increased compared to prior, correlate for cellulitis or contusive change. 6. Aortic atherosclerosis. Aortic Atherosclerosis (ICD10-I70.0). Electronically Signed   By: Luke Bun M.D.   On: 02/11/2024 16:38     Medications:      benzonatate   100 mg Oral TID   carvedilol   6.25 mg Oral BID WC   Chlorhexidine  Gluconate Cloth  6 each Topical Daily   Chlorhexidine  Gluconate Cloth  6 each Topical Q0600   doxycycline   100 mg Oral Q12H   heparin  injection (subcutaneous)  5,000 Units Subcutaneous Q8H   hydrALAZINE   5 mg Intravenous Once   insulin  aspart  0-6 Units Subcutaneous TID WC   insulin  glargine  20 Units Subcutaneous QHS   linaclotide   145 mcg Oral QPM   losartan   50 mg Oral Daily   mupirocin  ointment  1 Application Nasal BID   pantoprazole   40 mg Oral Daily   predniSONE    40 mg Oral Q breakfast   sodium chloride  flush  3 mL Intravenous Q12H   acetaminophen  **OR** acetaminophen , hydrOXYzine , ipratropium-albuterol , labetalol , ondansetron  **OR** ondansetron  (ZOFRAN ) IV, senna-docusate  Assessment/ Plan:  68 y.o. female ESRD on HD M TTS at [facility], hypertension, upper lipidemia, diabetes mellitus type 2, anemia chronic kidney disease, secondary hyperparathyroidism, GERD who presented with upper respiratory infections and was found to have influenza virus.  1.  ESRD on HD MTWF: Experienced some hypertension during dialysis. Will allow to rest today and plan for dialysis tomorrow.   2.  Hypertension.  Maintain the patient on carvedilol  and losartan  for hypertension control. Blood pressure stable for this patient  3.  Secondary hyperparathyroidism.  Calcium  acceptable. Will continue to monitor bone minerals.   4.  Anemia of chronic disease.  Hemoglobin 12.3.  Patient received Mircera as an outpatient.    5.  Hyperkalemia.  Serum potassium 5.7 on admission. Correct  6.  Influenza A infection.  Supportive care as per hospitalist.   LOS: 3 Jamesia Linnen 12/22/20251:01 PM   "

## 2024-02-12 NOTE — Care Management Important Message (Signed)
 Important Message  Patient Details  Name: Katrina Ortiz MRN: [id number] Date of Birth: [birth year]   Important Message Given:  Yes - Medicare IM     Deona Novitski W, CMA 02/12/2024, 2:37 PM

## 2024-02-12 NOTE — Progress Notes (Signed)
 " Progress Note   Patient: Katrina Ortiz FMW:[id number] DOB: 10-12-1955 DOA: 02/08/2024     3 DOS: the patient was seen and examined on 02/12/2024   Brief hospital course:  Katrina Ortiz is a 68 y.o. year old female with medical history of hypertension, hyperlipidemia, type 2 diabetes, ESRD on HD Monday, Tuesday, Wednesday, Friday presenting to the ED with worsening shortness of breath.   Patient reports not feeling well about since 4 days ago with URI symptoms and her facility tested her and she was positive for flu.  She was started on Tamiflu  on 12/15 but her symptoms have persisted.  Given her continued symptoms, she requested to go to the ED for further evaluation. She reports diffuse muscle pains, decreased appetite and malaise. Does not report fevers but states she has chills.      On arrival to the ED patient was noted to be HDS stable.  Lab work and imaging obtained.  CBC without leukocytosis, anemia at baseline, slight thrombocytopenia at 133.  BMP with mild hyponatremia at 131, chronic ESRD changes, significant hypocalcemia at 6.5.  Respiratory panel positive for influenza A.  proBNP checked and greater than 35,000.  Procalcitonin slightly elevated at 0.69.  Lactic acid normal.  Blood cultures obtained.  Chest x-ray shows vascular congestion but no opacity.  Patient given treatment with DuoNeb but given continued respiratory distress, [facility] contacted for admission.     12/21-rapid response was called and dialysis.  Patient was said to have transient LOC after an episode of emesis.  Awake, alert but very lethargic upon arrival.  Noted to be hypotensive in the dialysis unit.  Received 450 mL of IV fluid hydration as well as Reglan  10 mg IV push. Treatment discontinued. Transferred back to medical floor    12/22 -no further episodes of nausea poor oral intake remains poor.  Discussed with nephrology and no renal replacement therapy today since patient is dehydrated.    Assessment and  Plan:   Syncope Transient hypotension Nausea/vomiting Elevated troponin Most likely vasovagal Blood pressure improved following IV fluid hydration Continue supportive care with antiemetics CT scan of abdomen and pelvis without contrast showed no acute findings Troponin is flat and is most likely from demand ischemia due to hypotension r     COPD with acute exacerbation Most likely triggered by acute influenza infection Patient has completed a course of Tamiflu  with Continue patient inhaled steroids as bronchodilator therapy Patient has completed a course of systemic steroids CT scan of the chest without contrast shows Centrilobular ground glass nodules in the bilateral lower lobes and lingula,compatible with small airway infection/inflammation. Round focus of consolidation in the posterior right lower lobe with associated trace right pleural effusion, favored to represent round atelectasis. Follow-up in 3 months is recommended to establish stability. Continue doxycycline  to complete a 5-day course of therapy Will discontinue Rocephin  due to allergy Continue oxygen supplementation to maintain pulse oximetry greater than 92%     Acute on chronic diastolic dysfunction CHF:  Patient's volume status is primarily managed with hemodialysis. Hold Bumex  Continue carvedilol  and losartan  as blood pressure tolerates Continue scheduled renal replacement therapy     Diabetes mellitus with complications of ESRD:  Improve glycemic control Continue Lantus  to 20 units daily Patient is currently on a clear liquid diet.  Will advance as tolerated Pt on HD on M/Tue/W/F. Appreciate nephrology consult for renal replacement therapy       Hypertension:  Continue carvedilol  and losartan      CAD/Hx of CABG:  Continue carvedilol  and Zetia      Hyperkalemia Improved following renal replacement therapy     Hyponatremia: Most likely secondary to volume overload Expect improvement in sodium  levels following dialysis       GERD:  Continue home PPI.      Hypocalcemia Supplement calcium      Obesity BMI 35.50 Complicates overall prognosis and care Lifestyle modification and exercise discussed with patient in detail          Subjective: Patient seen and examined at the bedside.  Appetite is poor.  No further nausea  Physical Exam: Vitals:   02/12/24 0910 02/12/24 1005 02/12/24 1107 02/12/24 1137  BP: (!) 188/66 (!) 166/63 (!) 168/62 (!) 156/85  Pulse:   65   Resp:   18   Temp:   97.9 F (36.6 C)   TempSrc:   Oral   SpO2:   100%   Weight:      Height:       Gen: Awake, chronically ill appearing HENT: NCAT CV: normal heart sounds Lung: scattered rhonchi present Abd: Diffusely tender, soft , normal bowel sounds MSK: No asymmetry, good bulk and tone Neuro: alert and oriented   Data Reviewed: Sodium 133, glucose 148, BUN 54, creatinine 3.69 Labs reviewed  Family Communication: Plan of care discussed with patient at the bedside.  All questions and concerns have been addressed.  She verbalizes understanding and agrees with the plan.  Disposition: Status is: Inpatient Remains inpatient appropriate because: Needs diet advanced  Planned Discharge Destination: Skilled nursing facility    Time spent: 40 minutes  Author: Aimee Somerset, MD 02/12/2024 12:46 PM  For on call review [email or web address].  "

## 2024-02-13 DIAGNOSIS — J101 Influenza due to other identified influenza virus with other respiratory manifestations: Secondary | ICD-10-CM | POA: Diagnosis not present

## 2024-02-13 LAB — CULTURE, BLOOD (ROUTINE X 2)
Culture: NO GROWTH
Culture: NO GROWTH
Special Requests: ADEQUATE

## 2024-02-13 LAB — RENAL FUNCTION PANEL
Albumin: 3.8 g/dL (ref 3.5–5.0)
Anion gap: 16 — ABNORMAL HIGH (ref 5–15)
BUN: 66 mg/dL — ABNORMAL HIGH (ref 8–23)
CO2: 24 mmol/L (ref 22–32)
Calcium: 7.4 mg/dL — ABNORMAL LOW (ref 8.9–10.3)
Chloride: 93 mmol/L — ABNORMAL LOW (ref 98–111)
Creatinine, Ser: 4.2 mg/dL — ABNORMAL HIGH (ref 0.44–1.00)
GFR, Estimated: 11 mL/min — ABNORMAL LOW
Glucose, Bld: 125 mg/dL — ABNORMAL HIGH (ref 70–99)
Phosphorus: 5.1 mg/dL — ABNORMAL HIGH (ref 2.5–4.6)
Potassium: 4.1 mmol/L (ref 3.5–5.1)
Sodium: 133 mmol/L — ABNORMAL LOW (ref 135–145)

## 2024-02-13 LAB — CBC
HCT: 35.1 % — ABNORMAL LOW (ref 36.0–46.0)
Hemoglobin: 11.3 g/dL — ABNORMAL LOW (ref 12.0–15.0)
MCH: 28.8 pg (ref 26.0–34.0)
MCHC: 32.2 g/dL (ref 30.0–36.0)
MCV: 89.3 fL (ref 80.0–100.0)
Platelets: 155 K/uL (ref 150–400)
RBC: 3.93 MIL/uL (ref 3.87–5.11)
RDW: 13.8 % (ref 11.5–15.5)
WBC: 7.4 K/uL (ref 4.0–10.5)
nRBC: 0 % (ref 0.0–0.2)

## 2024-02-13 LAB — GLUCOSE, CAPILLARY
Glucose-Capillary: 146 mg/dL — ABNORMAL HIGH (ref 70–99)
Glucose-Capillary: 151 mg/dL — ABNORMAL HIGH (ref 70–99)
Glucose-Capillary: 202 mg/dL — ABNORMAL HIGH (ref 70–99)
Glucose-Capillary: 55 mg/dL — ABNORMAL LOW (ref 70–99)
Glucose-Capillary: 71 mg/dL (ref 70–99)
Glucose-Capillary: 96 mg/dL (ref 70–99)

## 2024-02-13 MED ORDER — HEPARIN SODIUM (PORCINE) 1000 UNIT/ML DIALYSIS: 1000.0000 [IU] | INTRAMUSCULAR | Status: DC | PRN

## 2024-02-13 MED ORDER — HEPARIN SODIUM (PORCINE) 1000 UNIT/ML IJ SOLN
INTRAMUSCULAR | Status: AC
  Filled 2024-02-13: qty 4

## 2024-02-13 MED ORDER — ALTEPLASE 2 MG IJ SOLR: 2.0000 mg | Freq: Once | INTRAMUSCULAR | Status: DC | PRN

## 2024-02-13 MED ORDER — BISACODYL 10 MG RE SUPP
10.0000 mg | Freq: Once | RECTAL | Status: AC
  Administered 2024-02-13: 10 mg via RECTAL
  Filled 2024-02-13: qty 1

## 2024-02-13 NOTE — Plan of Care (Signed)

## 2024-02-13 NOTE — Progress Notes (Signed)
 " [facility]  ROUNDING NOTE   Subjective:   68 year old female with past medical history of ESRD on HD MTWF at [facility], hypertension, hyperlipidemia, diabetes mellitus type 2, anemia chronic kidney disease, secondary hyperparathyroidism who came in with URI symptoms and tested positive for the flu.  Update: Patient seen and evaluated during dialysis   HEMODIALYSIS FLOWSHEET:  Blood Flow Rate (mL/min): 0 mL/min Arterial Pressure (mmHg): -7.47 mmHg Venous Pressure (mmHg): 374.93 mmHg TMP (mmHg): 9.7 mmHg Ultrafiltration Rate (mL/min): 1171 mL/min Dialysate Flow Rate (mL/min): 300 ml/min  Remains anxious during treatment  Objective:  Vital signs in last 24 hours:  Temp:  [97.3 F (36.3 C)-97.9 F (36.6 C)] 97.5 F (36.4 C) (12/23 1121) Pulse Rate:  [64-78] 73 (12/23 1121) Resp:  [12-22] 19 (12/23 1121) BP: (155-200)/(58-104) 182/91 (12/23 1121) SpO2:  [94 %-100 %] 99 % (12/23 1121) Weight:  [89.2 kg-90.8 kg] 89.2 kg (12/23 1121)  Weight change: -0.1 kg Filed Weights   02/13/24 0459 02/13/24 0743 02/13/24 1121  Weight: 90.8 kg 89.2 kg 89.2 kg    Intake/Output: I/O last 3 completed shifts: In: 750 [P.O.:750] Out: 150 [Urine:150]   Intake/Output this shift:  No intake/output data recorded.  Physical Exam: General: No acute distress, restless  Head: Normocephalic, atraumatic. Moist oral mucosal membranes  Lungs:  Clear to auscultation, normal effort  Heart: S1S2 no rubs  Abdomen:  Soft, nontender, bowel sounds present  Extremities: Trace peripheral edema.  Neurologic: Awake, alert, following commands  Skin: No acute rash  Access: Right IJ PermCath    Basic Metabolic Panel: Recent Labs  Lab 02/08/24 0828 02/08/24 0845 02/09/24 0620 02/11/24 0522 02/12/24 1014 02/13/24 0759  NA 131*  --  127* 128* 133* 133*  K 4.4  --  5.7* 5.4* 4.1 4.1  CL 91*  --  87* 89* 92* 93*  CO2 20*  --  20* 19* 25 24  GLUCOSE 125*  --  331* 277* 148* 125*  BUN  64*  --  82* 73* 54* 66*  CREATININE 4.67*  --  5.32* 4.07* 3.69* 4.20*  CALCIUM  6.5*  --  6.6* 7.3* 7.9* 7.4*  MG  --  1.9  --   --   --   --   PHOS  --   --  6.9*  --   --  5.1*    Liver Function Tests: Recent Labs  Lab 02/08/24 1825 02/09/24 0620 02/12/24 1014 02/13/24 0759  AST  --   --  21  --   ALT  --   --  16  --   ALKPHOS  --   --  169*  --   BILITOT  --   --  0.4  --   PROT  --   --  6.4*  --   ALBUMIN  3.5 3.5 3.6 3.8   No results for input(s): LIPASE, AMYLASE in the last 168 hours. No results for input(s): AMMONIA in the last 168 hours.  CBC: Recent Labs  Lab 02/09/24 0620 02/09/24 1200 02/11/24 0522 02/12/24 1014 02/13/24 0759  WBC 3.7* 5.2 5.5 8.1 7.4  NEUTROABS  --   --   --  6.4  --   HGB 10.5* 11.0* 12.8 12.3 11.3*  HCT 32.9* 33.7* 38.6 38.4 35.1*  MCV 90.1 88.7 88.7 88.5 89.3  PLT 129* 155 130* 154 155    Cardiac Enzymes: No results for input(s): CKTOTAL, CKMB, CKMBINDEX, TROPONINI in the last 168 hours.  BNP: Invalid input(s): POCBNP  CBG: Recent Labs  Lab 02/12/24 1103 02/12/24 1625 02/12/24 2143 02/13/24 0012 02/13/24 0402  GLUCAP 133* 209* 252* 202* 151*    Microbiology: Results for orders placed or performed during the hospital encounter of 02/08/24  Resp panel by RT-PCR (RSV, Flu A&B, Covid) Anterior Nasal Swab     Status: Abnormal   Collection Time: 02/08/24  8:45 AM   Specimen: Anterior Nasal Swab  Result Value Ref Range Status   SARS Coronavirus 2 by RT PCR NEGATIVE NEGATIVE Final    Comment: (NOTE) SARS-CoV-2 target nucleic acids are NOT DETECTED.  The SARS-CoV-2 RNA is generally detectable in upper respiratory specimens during the acute phase of infection. The lowest concentration of SARS-CoV-2 viral copies this assay can detect is 138 copies/mL. A negative result does not preclude SARS-Cov-2 infection and should not be used as the sole basis for treatment or other patient management decisions. A  negative result may occur with  improper specimen collection/handling, submission of specimen other than nasopharyngeal swab, presence of viral mutation(s) within the areas targeted by this assay, and inadequate number of viral copies(<138 copies/mL). A negative result must be combined with clinical observations, patient history, and epidemiological information. The expected result is Negative.  Fact Sheet for Patients:  bloggercourse.com  Fact Sheet for Healthcare Providers:  seriousbroker.it  This test is no t yet approved or cleared by the [place]  FDA and  has been authorized for detection and/or diagnosis of SARS-CoV-2 by FDA under an Emergency Use Authorization (EUA). This EUA will remain  in effect (meaning this test can be used) for the duration of the COVID-19 declaration under Section 564(b)(1) of the Act, 21 U.S.C.section 360bbb-3(b)(1), unless the authorization is terminated  or revoked sooner.       Influenza A by PCR POSITIVE (A) NEGATIVE Final   Influenza B by PCR NEGATIVE NEGATIVE Final    Comment: (NOTE) The Xpert Xpress SARS-CoV-2/FLU/RSV plus assay is intended as an aid in the diagnosis of influenza from Nasopharyngeal swab specimens and should not be used as a sole basis for treatment. Nasal washings and aspirates are unacceptable for Xpert Xpress SARS-CoV-2/FLU/RSV testing.  Fact Sheet for Patients: bloggercourse.com  Fact Sheet for Healthcare Providers: seriousbroker.it  This test is not yet approved or cleared by the [place]  FDA and has been authorized for detection and/or diagnosis of SARS-CoV-2 by FDA under an Emergency Use Authorization (EUA). This EUA will remain in effect (meaning this test can be used) for the duration of the COVID-19 declaration under Section 564(b)(1) of the Act, 21 U.S.C. section 360bbb-3(b)(1), unless the  authorization is terminated or revoked.     Resp Syncytial Virus by PCR NEGATIVE NEGATIVE Final    Comment: (NOTE) Fact Sheet for Patients: bloggercourse.com  Fact Sheet for Healthcare Providers: seriousbroker.it  This test is not yet approved or cleared by the [place]  FDA and has been authorized for detection and/or diagnosis of SARS-CoV-2 by FDA under an Emergency Use Authorization (EUA). This EUA will remain in effect (meaning this test can be used) for the duration of the COVID-19 declaration under Section 564(b)(1) of the Act, 21 U.S.C. section 360bbb-3(b)(1), unless the authorization is terminated or revoked.  Performed at [facility], [address] [place], [place] [address]   Blood Culture (routine x 2)     Status: None   Collection Time: 02/08/24  8:45 AM   Specimen: BLOOD  Result Value Ref Range Status   Specimen Description BLOOD BLOOD LEFT HAND  Final  Special Requests   Final    BOTTLES DRAWN AEROBIC AND ANAEROBIC Blood Culture adequate volume   Culture   Final    NO GROWTH 5 DAYS Performed at [facility], [address] [place], [place] [address]    Report Status 02/13/2024 FINAL  Final  Blood Culture (routine x 2)     Status: None   Collection Time: 02/08/24  8:50 AM   Specimen: BLOOD  Result Value Ref Range Status   Specimen Description BLOOD BLOOD LEFT ARM  Final   Special Requests   Final    BOTTLES DRAWN AEROBIC AND ANAEROBIC Blood Culture results may not be optimal due to an inadequate volume of blood received in culture bottles   Culture   Final    NO GROWTH 5 DAYS Performed at [facility], [address] [place], [place] [address]    Report Status 02/13/2024 FINAL  Final  MRSA Next Gen by PCR, Nasal     Status: Abnormal   Collection Time: 02/08/24  5:02 PM   Specimen: Nasal Mucosa; Nasal Swab  Result Value Ref Range Status   MRSA by PCR Next Gen  DETECTED (A) NOT DETECTED Final    Comment: RESULT CALLED TO, READ BACK BY AND VERIFIED WITH: Fleming Kitty @2057  on 02/08/24 skl (NOTE) The GeneXpert MRSA Assay (FDA approved for NASAL specimens only), is one component of a comprehensive MRSA colonization surveillance program. It is not intended to diagnose MRSA infection nor to guide or monitor treatment for MRSA infections. Test performance is not FDA approved in patients less than 74 years old. Performed at [facility], [address] [place], [place] [address]     Coagulation Studies: No results for input(s): LABPROT, INR in the last 72 hours.   Urinalysis: No results for input(s): COLORURINE, LABSPEC, PHURINE, GLUCOSEU, HGBUR, BILIRUBINUR, KETONESUR, PROTEINUR, UROBILINOGEN, NITRITE, LEUKOCYTESUR in the last 72 hours.  Invalid input(s): APPERANCEUR    Imaging: CT ABDOMEN PELVIS WO CONTRAST Result Date: 02/11/2024 CLINICAL DATA:  Abdomen pain EXAM: CT ABDOMEN AND PELVIS WITHOUT CONTRAST TECHNIQUE: Multidetector CT imaging of the abdomen and pelvis was performed following the standard protocol without IV contrast. RADIATION DOSE REDUCTION: This exam was performed according to the departmental dose-optimization program which includes automated exposure control, adjustment of the mA and/or kV according to patient size and/or use of iterative reconstruction technique. COMPARISON:  CT 10/17/2023, 08/28/2023, 04/14/2021, 06/02/2023 FINDINGS: Lower chest: Lung bases demonstrate trace right-sided chronic pleural effusion with rim thickening. Chronic rounded area of consolidation in the right lower lobe, stable since April 2025 and favored to represent round atelectasis. Chronic scarring within the left lower lobe. Cardiomegaly and coronary vascular calcification Hepatobiliary: Sludge and gallstones. No right upper quadrant inflammation. No focal hepatic abnormality or biliary dilatation Pancreas:  Unremarkable. No pancreatic ductal dilatation or surrounding inflammatory changes. Spleen: Normal in size without focal abnormality. Adrenals/Urinary Tract: Adrenal glands are normal. Mild cortical atrophy of kidneys. Nonspecific perinephric stranding. No hydronephrosis. Small focus of gas within the urinary bladder Stomach/Bowel: Stomach within normal limits. No dilated small bowel. No acute bowel wall thickening. Negative appendix Vascular/Lymphatic: Aortic atherosclerosis. No enlarged abdominal or pelvic lymph nodes. Reproductive: Bilateral adnexal clips. Suspicion of uterine fibroids. No adnexal mass Other: No ascites or free air. Left flank edema. Skin thickening and subcutaneous infiltration of left abdominal pannus, slightly increased compared to prior. Small focus of gas within the right subcutaneous abdominal fat possibly due to recent injection Musculoskeletal: No acute osseous abnormality. 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 No CT evidence for acute intra-abdominal or pelvic abnormality. 2. Chronic trace right-sided pleural effusion with rim thickening and chronic rounded area of consolidation in the right lower lobe, favored to represent round atelectasis. 3. Gallstones and sludge but no right upper quadrant inflammation. 4. Small focus of gas within the urinary bladder, correlate for recent instrumentation. 5. Skin thickening and subcutaneous infiltration of left lateral abdominal pannus, slightly increased compared to prior, correlate for cellulitis or contusive change. 6. Aortic atherosclerosis. Aortic Atherosclerosis (ICD10-I70.0). Electronically Signed   By: Luke Bun M.D.   On: 02/11/2024 16:38     Medications:      benzonatate   100 mg Oral TID   carvedilol   6.25 mg Oral BID WC   Chlorhexidine  Gluconate Cloth  6 each Topical Daily   Chlorhexidine  Gluconate Cloth  6 each Topical Q0600   guaiFENesin   1,200 mg Oral BID   heparin  injection (subcutaneous)   5,000 Units Subcutaneous Q8H   hydrALAZINE   5 mg Intravenous Once   insulin  aspart  0-6 Units Subcutaneous TID WC   insulin  glargine  20 Units Subcutaneous QHS   linaclotide   145 mcg Oral QPM   losartan   50 mg Oral Daily   mupirocin  ointment  1 Application Nasal BID   pantoprazole   40 mg Oral Daily   sodium chloride  flush  3 mL Intravenous Q12H   acetaminophen  **OR** acetaminophen , alteplase , heparin , hydrOXYzine , ipratropium-albuterol , labetalol , ondansetron  **OR** ondansetron  (ZOFRAN ) IV, senna-docusate  Assessment/ Plan:  68 y.o. female ESRD on HD M TTS at [facility], hypertension, upper lipidemia, diabetes mellitus type 2, anemia chronic kidney disease, secondary hyperparathyroidism, GERD who presented with upper respiratory infections and was found to have influenza virus.  1.  ESRD on HD MTWF: Receiving dialysis today, no UF. Multiple cath pressure alarms due to positional access. Treatment terminated with 35 minutes remaining. Next treatment scheduled for Friday  2.  Hypertension.  Maintain the patient on carvedilol  and losartan  for hypertension control. Blood pressure 155/82 during dialysis.   3.  Secondary hyperparathyroidism.  Corrected calcium  7.6. Patient receives cholecalciferol  outpatient. Will continue to monitor bone minerals.   4.  Anemia of chronic disease.  Hemoglobin 11.3.  Patient received Mircera as an outpatient.    5.  Hyperkalemia.  Serum potassium 5.7 on admission. Corrected  6.  Influenza A infection.  Supportive care as per hospitalist.   LOS: 4 Letonia Stead 12/23/202511:34 AM   "

## 2024-02-13 NOTE — Progress Notes (Signed)
 Hemodialysis Note:  Received patient in bed to unit. Alert and oriented but a little bit anxious. Informed consent singed and in chart.  Treatment initiated: 0804 Treatment completed: 1121  Access used: Right Subclavian catheter Access issues: Rinsed back 38 minutes early due to high venous pressure. HD NP made aware  Transported back to room, alert without acute distress. Report given to patient's RN.  Total UF removed: 0 Medications given: Atarax  25 mg Tablet  Post HD weight: 89.2 Kg  [facility]

## 2024-02-13 NOTE — Plan of Care (Signed)

## 2024-02-13 NOTE — Progress Notes (Signed)
 Hypoglycemic Event  CBG: 55  Treatment: 8 oz juice/soda  Symptoms: Hungry  Follow-up CBG: Time:1315 CBG Result:71  Possible Reasons for Event: Inadequate meal intake  Comments/MD notified:Dr Agbata    Donnell DELENA Search

## 2024-02-13 NOTE — Inpatient Diabetes Management (Signed)
 Inpatient Diabetes Program Recommendations  AACE/ADA: New Consensus Statement on Inpatient Glycemic Control (2015)  Target Ranges:  Prepandial:   less than 140 mg/dL      Peak postprandial:   less than 180 mg/dL (1-2 hours)      Critically ill patients:  140 - 180 mg/dL    Latest Reference Range & Units 02/13/24 00:12 02/13/24 04:02 02/13/24 12:45  Glucose-Capillary 70 - 99 mg/dL 797 (H) 848 (H) 55 (L)  (H): Data is abnormally high (L): Data is abnormally low     Home DM Meds: Lantus  10 units at bedtime Humalog  TID with meals   Current Orders: Lantus  20 units at HS Novolog  0-6 units TID Prednisone  40 mg daily    MD- Note Hypoglycemia after HD treatment today  Pt only takes 10 units Lantus  at HS at home  Please reduce the Lantus  to 10 units at HS    --Will follow patient during hospitalization--  Adina Rudolpho Arrow RN, MSN, CDCES Diabetes Coordinator Inpatient Glycemic Control Team Team Pager: [phone] (8a-5p)

## 2024-02-13 NOTE — Progress Notes (Signed)
 " Progress Note   Patient: Katrina Ortiz FMW:[id number] DOB: [birth year] DOA: 02/08/2024     4 DOS: the patient was seen and examined on 02/13/2024   Brief hospital course:  Katrina Ortiz is a 68 y.o. year old female with medical history of hypertension, hyperlipidemia, type 2 diabetes, ESRD on HD Monday, Tuesday, Wednesday, Friday presenting to the ED with worsening shortness of breath.   Patient reports not feeling well about since 4 days ago with URI symptoms and her facility tested her and she was positive for flu.  She was started on Tamiflu  on 12/15 but her symptoms have persisted.  Given her continued symptoms, she requested to go to the ED for further evaluation. She reports diffuse muscle pains, decreased appetite and malaise. Does not report fevers but states she has chills.      On arrival to the ED patient was noted to be HDS stable.  Lab work and imaging obtained.  CBC without leukocytosis, anemia at baseline, slight thrombocytopenia at 133.  BMP with mild hyponatremia at 131, chronic ESRD changes, significant hypocalcemia at 6.5.  Respiratory panel positive for influenza A.  proBNP checked and greater than 35,000.  Procalcitonin slightly elevated at 0.69.  Lactic acid normal.  Blood cultures obtained.  Chest x-ray shows vascular congestion but no opacity.  Patient given treatment with DuoNeb but given continued respiratory distress, TRH contacted for admission.     12/21-rapid response was called and dialysis.  Patient was said to have transient LOC after an episode of emesis.  Awake, alert but very lethargic upon arrival.  Noted to be hypotensive in the dialysis unit.  Received 450 mL of IV fluid hydration as well as Reglan  10 mg IV push. Treatment discontinued. Transferred back to medical floor     12/22 -no further episodes of nausea poor oral intake remains poor.  Discussed with nephrology and no renal replacement therapy today since patient is dehydrated.  12/23 - Complains of  constipation.  Had an episode of hypoglycemia        Assessment and Plan:  Syncope Transient hypotension Nausea/vomiting Elevated troponin Most likely vasovagal Blood pressure improved following IV fluid hydration Continue supportive care with antiemetics CT scan of abdomen and pelvis without contrast showed no acute findings Troponin is flat and is most likely from demand ischemia due to hypotension r     COPD with acute exacerbation Most likely triggered by acute influenza infection Patient has completed a course of Tamiflu  with Continue patient inhaled steroids as bronchodilator therapy Patient has completed a course of systemic steroids CT scan of the chest without contrast shows Centrilobular ground glass nodules in the bilateral lower lobes and lingula,compatible with small airway infection/inflammation. Round focus of consolidation in the posterior right lower lobe with associated trace right pleural effusion, favored to represent round atelectasis. Follow-up in 3 months is recommended to establish stability. Continue doxycycline  to complete a 5-day course of therapy Will discontinue Rocephin  due to allergy Continue oxygen supplementation to maintain pulse oximetry greater than 92%     Acute on chronic diastolic dysfunction CHF:  Patient's volume status is primarily managed with hemodialysis. Hold Bumex  Continue carvedilol  and losartan  as blood pressure tolerates Continue scheduled renal replacement therapy     Diabetes mellitus with complications of ESRD:  Improve glycemic control Episode of hypoglycemia 12/2 related to poor oral intake3  Continue Lantus  to 20 units daily Advance diet to dialysis renal Pt on HD on M/Tue/W/F. Appreciate nephrology consult for renal replacement therapy  Hypertension:  Continue carvedilol  and losartan      CAD/Hx of CABG:  Continue carvedilol  and Zetia      Hyperkalemia Improved following renal replacement therapy      Hyponatremia: Most likely secondary to volume overload Expect improvement in sodium levels following dialysis       GERD:  Continue home PPI.      Hypocalcemia Supplement calcium      Obesity BMI 35.50 Complicates overall prognosis and care Lifestyle modification and exercise discussed with patient in detail             Subjective: Complains of constipation  Physical Exam: Vitals:   02/13/24 1100 02/13/24 1121 02/13/24 1304 02/13/24 1433  BP: (!) 167/58 (!) 182/91 (!) 176/68 125/67  Pulse: 72 73 77 61  Resp: 16 19  18   Temp:  (!) 97.5 F (36.4 C)  98 F (36.7 C)  TempSrc:  Axillary    SpO2: 98% 99% 100% 95%  Weight:  89.2 kg    Height:       Gen: Awake, chronically ill appearing HENT: NCAT CV: normal heart sounds Lung: scattered rhonchi present Abd: Diffusely tender, soft , normal bowel sounds MSK: No asymmetry, good bulk and tone Neuro: alert and oriented  Data Reviewed: Sodium 133, creatinine 4.20 Labs reviewed  Family Communication: Plan of care was discussed with patient in detail.  She verbalizes understanding and agrees with the plan.  Disposition: Status is: Inpatient Remains inpatient appropriate because: Hypoglycemia  Planned Discharge Destination: Skilled nursing facility    Time spent: 35 minutes  Author: Aimee Somerset, MD 02/13/2024 3:07 PM  For on call review [email or web address].  "

## 2024-02-14 LAB — GLUCOSE, CAPILLARY
Glucose-Capillary: 116 mg/dL — ABNORMAL HIGH (ref 70–99)
Glucose-Capillary: 124 mg/dL — ABNORMAL HIGH (ref 70–99)
Glucose-Capillary: 143 mg/dL — ABNORMAL HIGH (ref 70–99)
Glucose-Capillary: 156 mg/dL — ABNORMAL HIGH (ref 70–99)
Glucose-Capillary: 22 mg/dL — CL (ref 70–99)
Glucose-Capillary: 221 mg/dL — ABNORMAL HIGH (ref 70–99)
Glucose-Capillary: 224 mg/dL — ABNORMAL HIGH (ref 70–99)
Glucose-Capillary: 81 mg/dL (ref 70–99)
Glucose-Capillary: 91 mg/dL (ref 70–99)

## 2024-02-14 LAB — BASIC METABOLIC PANEL WITH GFR
Anion gap: 14 (ref 5–15)
BUN: 43 mg/dL — ABNORMAL HIGH (ref 8–23)
CO2: 23 mmol/L (ref 22–32)
Calcium: 7 mg/dL — ABNORMAL LOW (ref 8.9–10.3)
Chloride: 96 mmol/L — ABNORMAL LOW (ref 98–111)
Creatinine, Ser: 3.34 mg/dL — ABNORMAL HIGH (ref 0.44–1.00)
GFR, Estimated: 14 mL/min — ABNORMAL LOW
Glucose, Bld: 164 mg/dL — ABNORMAL HIGH (ref 70–99)
Potassium: 4.3 mmol/L (ref 3.5–5.1)
Sodium: 133 mmol/L — ABNORMAL LOW (ref 135–145)

## 2024-02-14 MED ORDER — DEXTROSE 10 % IV SOLN: INTRAVENOUS | Status: DC

## 2024-02-14 MED ORDER — DEXTROSE 50 % IV SOLN: 50.0000 mL | INTRAVENOUS | Status: DC | PRN

## 2024-02-14 MED ORDER — DEXTROSE 50 % IV SOLN
INTRAVENOUS | Status: AC
  Administered 2024-02-14: 50 mL
  Filled 2024-02-14: qty 50

## 2024-02-14 NOTE — Plan of Care (Signed)

## 2024-02-14 NOTE — Progress Notes (Signed)
 " Progress Note   Patient: Katrina Ortiz FMW:[id number] DOB: 12/01/1955 DOA: 02/08/2024     5 DOS: the patient was seen and examined on 02/14/2024   Brief hospital course:  Sarinity Dicicco is a 68 y.o. year old female with medical history of hypertension, hyperlipidemia, type 2 diabetes, ESRD on HD Monday, Tuesday, Wednesday, Friday presenting to the ED with worsening shortness of breath.   Patient reports not feeling well about since 4 days ago with URI symptoms and her facility tested her and she was positive for flu.  She was started on Tamiflu  on 12/15 but her symptoms have persisted.  Given her continued symptoms, she requested to go to the ED for further evaluation. She reports diffuse muscle pains, decreased appetite and malaise. Does not report fevers but states she has chills.      On arrival to the ED patient was noted to be HDS stable.  Lab work and imaging obtained.  CBC without leukocytosis, anemia at baseline, slight thrombocytopenia at 133.  BMP with mild hyponatremia at 131, chronic ESRD changes, significant hypocalcemia at 6.5.  Respiratory panel positive for influenza A.  proBNP checked and greater than 35,000.  Procalcitonin slightly elevated at 0.69.  Lactic acid normal.  Blood cultures obtained.  Chest x-ray shows vascular congestion but no opacity.  Patient given treatment with DuoNeb but given continued respiratory distress, [facility] contacted for admission.     12/21-rapid response was called and dialysis.  Patient was said to have transient LOC after an episode of emesis.  Awake, alert but very lethargic upon arrival.  Noted to be hypotensive in the dialysis unit.  Received 450 mL of IV fluid hydration as well as Reglan  10 mg IV push. Treatment discontinued. Transferred back to medical floor     12/22 -no further episodes of nausea poor oral intake remains poor.  Discussed with nephrology and no renal replacement therapy today since patient is dehydrated.   12/23 - Complains  of constipation.  Had an episode of hypoglycemia   12/24 -hypoglycemia overnight.  Oral intake remains poor      Assessment and Plan:  Diabetes mellitus with complications of ESRD:  Hypoglycemic episodes Initial hyperglycemia was secondary to systemic steroids which have been discontinued Discontinue Lantus  Encourage oral intake Dialysis days are M/Tue/W/F Appreciate nephrology consult for renal replacement therapy     Syncope Transient hypotension Nausea/vomiting Elevated troponin Most likely vasovagal or secondary to hypotension This happened during dialysis Blood pressure improved following IV fluid hydration Continue supportive care with antiemetics CT scan of abdomen and pelvis without contrast showed no acute findings Troponin is flat and is most likely from demand ischemia due to hypotension      COPD with acute exacerbation Most likely triggered by acute influenza infection Patient has completed a course of Tamiflu  with Continue patient inhaled steroids as bronchodilator therapy Patient has completed a course of systemic steroids CT scan of the chest without contrast shows Centrilobular ground glass nodules in the bilateral lower lobes and lingula,compatible with small airway infection/inflammation. Round focus of consolidation in the posterior right lower lobe with associated trace right pleural effusion, favored to represent round atelectasis. Follow-up in 3 months is recommended to establish stability. Continue doxycycline  to complete a 5-day course of therapy Will discontinue Rocephin  due to allergy Continue oxygen supplementation to maintain pulse oximetry greater than 92%     Acute on chronic diastolic dysfunction CHF:  Patient's volume status is primarily managed with hemodialysis. Hold Bumex  Continue carvedilol  and losartan   as blood pressure tolerates Continue scheduled renal replacement therapy     Hypertension:  Continue carvedilol  and losartan       CAD/Hx of CABG:  Continue carvedilol  and Zetia      Hyperkalemia Improved following renal replacement therapy     Hyponatremia: Most likely secondary to volume overload Improved following dialysis       GERD:  Continue home PPI.      Hypocalcemia Supplement calcium      Obesity BMI 35.50 Complicates overall prognosis and care Lifestyle modification and exercise discussed with patient in detail         Subjective: Oral intake is still very poor  Physical Exam: Vitals:   02/14/24 0447 02/14/24 0500 02/14/24 0756 02/14/24 1142  BP: (!) 185/73 (!) 128/46 (!) 156/83 139/76  Pulse: 66  74 71  Resp: 16  16 17   Temp:      TempSrc:      SpO2: 97%  100% 95%  Weight:  92.1 kg    Height:       Gen: Awake, chronically ill appearing HENT: NCAT CV: normal heart sounds Lung: scattered rhonchi present Abd: Diffusely tender, soft , normal bowel sounds MSK: No asymmetry, good bulk and tone Neuro: alert and oriented   Data Reviewed: BUN 43, creatinine 3.34 There are no new results to review at this time.  Family Communication: Plan of care discussed with patient in detail.  She verbalizes understanding and agrees with the plan  Disposition: Status is: Inpatient Remains inpatient appropriate because: Discharge planning  Planned Discharge Destination: Skilled nursing facility    Time spent: 40 minutes  Author: Aimee Somerset, MD 02/14/2024 1:23 PM  For on call review [email or web address].  "

## 2024-02-14 NOTE — Progress Notes (Signed)
 Hypoglycemic Event  CBG: 22 @0450   Treatment: 8 oz juice/soda and D50 50 mL (25 gm) @0455   Symptoms: Pale, Sweaty, and Shaky  Follow-up CBG: Time:0510 CBG Result:221  Possible Reasons for Event: Unknown  Comments/MD notified: Dr.Hall notified; d10 orders placed.    Yates Prose

## 2024-02-14 NOTE — Progress Notes (Signed)
 Pt refused enema today. Said she would have it tomorrow if the pain comes back. Last [facility] 02/13/24

## 2024-02-14 NOTE — Progress Notes (Signed)
 " [facility]  ROUNDING NOTE   Subjective:   68 year old female with past medical history of ESRD on HD MTWF at [facility], hypertension, hyperlipidemia, diabetes mellitus type 2, anemia chronic kidney disease, secondary hyperparathyroidism who came in with URI symptoms and tested positive for the flu.  Update: Patient seen sitting up in bed Appears anxious Restless   Objective:  Vital signs in last 24 hours:  Temp:  [97.5 F (36.4 C)-98 F (36.7 C)] 97.5 F (36.4 C) (12/24 0017) Pulse Rate:  [61-77] 71 (12/24 1142) Resp:  [16-20] 17 (12/24 1142) BP: (125-185)/(46-89) 139/76 (12/24 1142) SpO2:  [93 %-100 %] 95 % (12/24 1142) Weight:  [92.1 kg] 92.1 kg (12/24 0500)  Weight change: -1.6 kg Filed Weights   02/13/24 0743 02/13/24 1121 02/14/24 0500  Weight: 89.2 kg 89.2 kg 92.1 kg    Intake/Output: I/O last 3 completed shifts: In: 390 [P.O.:390] Out: 0    Intake/Output this shift:  No intake/output data recorded.  Physical Exam: General: No acute distress, restless  Head: Normocephalic, atraumatic. Moist oral mucosal membranes  Lungs:  Clear to auscultation, normal effort  Heart: S1S2 no rubs  Abdomen:  Soft, nontender, bowel sounds present  Extremities: Trace peripheral edema.  Neurologic: Awake, alert, following commands  Skin: No acute rash  Access: Right IJ PermCath    Basic Metabolic Panel: Recent Labs  Lab 02/08/24 0845 02/09/24 0620 02/11/24 0522 02/12/24 1014 02/13/24 0759 02/14/24 0606  NA  --  127* 128* 133* 133* 133*  K  --  5.7* 5.4* 4.1 4.1 4.3  CL  --  87* 89* 92* 93* 96*  CO2  --  20* 19* 25 24 23   GLUCOSE  --  331* 277* 148* 125* 164*  BUN  --  82* 73* 54* 66* 43*  CREATININE  --  5.32* 4.07* 3.69* 4.20* 3.34*  CALCIUM   --  6.6* 7.3* 7.9* 7.4* 7.0*  MG 1.9  --   --   --   --   --   PHOS  --  6.9*  --   --  5.1*  --     Liver Function Tests: Recent Labs  Lab 02/08/24 1825 02/09/24 0620 02/12/24 1014 02/13/24 0759   AST  --   --  21  --   ALT  --   --  16  --   ALKPHOS  --   --  169*  --   BILITOT  --   --  0.4  --   PROT  --   --  6.4*  --   ALBUMIN  3.5 3.5 3.6 3.8   No results for input(s): LIPASE, AMYLASE in the last 168 hours. No results for input(s): AMMONIA in the last 168 hours.  CBC: Recent Labs  Lab 02/09/24 0620 02/09/24 1200 02/11/24 0522 02/12/24 1014 02/13/24 0759  WBC 3.7* 5.2 5.5 8.1 7.4  NEUTROABS  --   --   --  6.4  --   HGB 10.5* 11.0* 12.8 12.3 11.3*  HCT 32.9* 33.7* 38.6 38.4 35.1*  MCV 90.1 88.7 88.7 88.5 89.3  PLT 129* 155 130* 154 155    Cardiac Enzymes: No results for input(s): CKTOTAL, CKMB, CKMBINDEX, TROPONINI in the last 168 hours.  BNP: Invalid input(s): POCBNP  CBG: Recent Labs  Lab 02/14/24 0450 02/14/24 0509 02/14/24 0616 02/14/24 0758 02/14/24 1139  GLUCAP 22* 221* 143* 116* 124*    Microbiology: Results for orders placed or performed during the hospital encounter of 02/08/24  Resp panel by RT-PCR (RSV, Flu A&B, Covid) Anterior Nasal Swab     Status: Abnormal   Collection Time: 02/08/24  8:45 AM   Specimen: Anterior Nasal Swab  Result Value Ref Range Status   SARS Coronavirus 2 by RT PCR NEGATIVE NEGATIVE Final    Comment: (NOTE) SARS-CoV-2 target nucleic acids are NOT DETECTED.  The SARS-CoV-2 RNA is generally detectable in upper respiratory specimens during the acute phase of infection. The lowest concentration of SARS-CoV-2 viral copies this assay can detect is 138 copies/mL. A negative result does not preclude SARS-Cov-2 infection and should not be used as the sole basis for treatment or other patient management decisions. A negative result may occur with  improper specimen collection/handling, submission of specimen other than nasopharyngeal swab, presence of viral mutation(s) within the areas targeted by this assay, and inadequate number of viral copies(<138 copies/mL). A negative result must be combined  with clinical observations, patient history, and epidemiological information. The expected result is Negative.  Fact Sheet for Patients:  bloggercourse.com  Fact Sheet for Healthcare Providers:  seriousbroker.it  This test is no t yet approved or cleared by the [place]  FDA and  has been authorized for detection and/or diagnosis of SARS-CoV-2 by FDA under an Emergency Use Authorization (EUA). This EUA will remain  in effect (meaning this test can be used) for the duration of the COVID-19 declaration under Section 564(b)(1) of the Act, 21 U.S.C.section 360bbb-3(b)(1), unless the authorization is terminated  or revoked sooner.       Influenza A by PCR POSITIVE (A) NEGATIVE Final   Influenza B by PCR NEGATIVE NEGATIVE Final    Comment: (NOTE) The Xpert Xpress SARS-CoV-2/FLU/RSV plus assay is intended as an aid in the diagnosis of influenza from Nasopharyngeal swab specimens and should not be used as a sole basis for treatment. Nasal washings and aspirates are unacceptable for Xpert Xpress SARS-CoV-2/FLU/RSV testing.  Fact Sheet for Patients: bloggercourse.com  Fact Sheet for Healthcare Providers: seriousbroker.it  This test is not yet approved or cleared by the [place]  FDA and has been authorized for detection and/or diagnosis of SARS-CoV-2 by FDA under an Emergency Use Authorization (EUA). This EUA will remain in effect (meaning this test can be used) for the duration of the COVID-19 declaration under Section 564(b)(1) of the Act, 21 U.S.C. section 360bbb-3(b)(1), unless the authorization is terminated or revoked.     Resp Syncytial Virus by PCR NEGATIVE NEGATIVE Final    Comment: (NOTE) Fact Sheet for Patients: bloggercourse.com  Fact Sheet for Healthcare Providers: seriousbroker.it  This test is not yet  approved or cleared by the [place]  FDA and has been authorized for detection and/or diagnosis of SARS-CoV-2 by FDA under an Emergency Use Authorization (EUA). This EUA will remain in effect (meaning this test can be used) for the duration of the COVID-19 declaration under Section 564(b)(1) of the Act, 21 U.S.C. section 360bbb-3(b)(1), unless the authorization is terminated or revoked.  Performed at [facility], [address] [place], [place] [address]   Blood Culture (routine x 2)     Status: None   Collection Time: 02/08/24  8:45 AM   Specimen: BLOOD  Result Value Ref Range Status   Specimen Description BLOOD BLOOD LEFT HAND  Final   Special Requests   Final    BOTTLES DRAWN AEROBIC AND ANAEROBIC Blood Culture adequate volume   Culture   Final    NO GROWTH 5 DAYS Performed at [facility], [address] [place], [place] [address]    Report Status 02/13/2024 FINAL  Final  Blood Culture (routine x 2)     Status: None   Collection Time: 02/08/24  8:50 AM   Specimen: BLOOD  Result Value Ref Range Status   Specimen Description BLOOD BLOOD LEFT ARM  Final   Special Requests   Final    BOTTLES DRAWN AEROBIC AND ANAEROBIC Blood Culture results may not be optimal due to an inadequate volume of blood received in culture bottles   Culture   Final    NO GROWTH 5 DAYS Performed at [facility], [address], [place], [place] [address]    Report Status 02/13/2024 FINAL  Final  MRSA Next Gen by PCR, Nasal     Status: Abnormal   Collection Time: 02/08/24  5:02 PM   Specimen: Nasal Mucosa; Nasal Swab  Result Value Ref Range Status   MRSA by PCR Next Gen DETECTED (A) NOT DETECTED Final    Comment: RESULT CALLED TO, READ BACK BY AND VERIFIED WITH: Fleming Kitty @2057  on 02/08/24 skl (NOTE) The GeneXpert MRSA Assay (FDA approved for NASAL specimens only), is one component of a comprehensive MRSA colonization surveillance program. It is not intended  to diagnose MRSA infection nor to guide or monitor treatment for MRSA infections. Test performance is not FDA approved in patients less than 28 years old. Performed at [facility], [address] [place], [place] [address]     Coagulation Studies: No results for input(s): LABPROT, INR in the last 72 hours.   Urinalysis: No results for input(s): COLORURINE, LABSPEC, PHURINE, GLUCOSEU, HGBUR, BILIRUBINUR, KETONESUR, PROTEINUR, UROBILINOGEN, NITRITE, LEUKOCYTESUR in the last 72 hours.  Invalid input(s): APPERANCEUR    Imaging: No results found.    Medications:      benzonatate   100 mg Oral TID   carvedilol   6.25 mg Oral BID WC   Chlorhexidine  Gluconate Cloth  6 each Topical Q0600   guaiFENesin   1,200 mg Oral BID   heparin  injection (subcutaneous)  5,000 Units Subcutaneous Q8H   hydrALAZINE   5 mg Intravenous Once   linaclotide   145 mcg Oral QPM   losartan   50 mg Oral Daily   pantoprazole   40 mg Oral Daily   sodium chloride  flush  3 mL Intravenous Q12H   acetaminophen  **OR** acetaminophen , dextrose , hydrOXYzine , ipratropium-albuterol , labetalol , ondansetron  **OR** ondansetron  (ZOFRAN ) IV, senna-docusate  Assessment/ Plan:  68 y.o. female ESRD on HD M TTS at [facility], hypertension, upper lipidemia, diabetes mellitus type 2, anemia chronic kidney disease, secondary hyperparathyroidism, GERD who presented with upper respiratory infections and was found to have influenza virus.  1.  ESRD on HD MTWF: Received dialysis yesterday, UF 0.  Next treatment scheduled for Friday  2.  Hypertension.  Maintain the patient on carvedilol  and losartan  for hypertension control.   3.  Secondary hyperparathyroidism.  Patient receives cholecalciferol  outpatient. Hypocalcemia decreased likely due to septic reaction. Will restart cholecalciferol . Will continue to monitor bone minerals.   4.  Anemia of chronic disease.  Hemoglobin 11.3.  Patient received  Mircera as an outpatient.    5.  Hyperkalemia.  Corrected  6.  Influenza A infection.  Supportive care as per hospitalist.   LOS: 5 Aldwin Micalizzi 12/24/202512:36 PM   "

## 2024-02-15 DIAGNOSIS — Z951 Presence of aortocoronary bypass graft: Secondary | ICD-10-CM

## 2024-02-15 DIAGNOSIS — Z992 Dependence on renal dialysis: Secondary | ICD-10-CM | POA: Diagnosis not present

## 2024-02-15 DIAGNOSIS — I10 Essential (primary) hypertension: Secondary | ICD-10-CM

## 2024-02-15 DIAGNOSIS — J441 Chronic obstructive pulmonary disease with (acute) exacerbation: Secondary | ICD-10-CM | POA: Diagnosis not present

## 2024-02-15 DIAGNOSIS — D631 Anemia in chronic kidney disease: Secondary | ICD-10-CM

## 2024-02-15 DIAGNOSIS — I5032 Chronic diastolic (congestive) heart failure: Secondary | ICD-10-CM

## 2024-02-15 DIAGNOSIS — R0600 Dyspnea, unspecified: Secondary | ICD-10-CM | POA: Diagnosis not present

## 2024-02-15 DIAGNOSIS — E1122 Type 2 diabetes mellitus with diabetic chronic kidney disease: Secondary | ICD-10-CM | POA: Diagnosis not present

## 2024-02-15 DIAGNOSIS — N186 End stage renal disease: Secondary | ICD-10-CM | POA: Diagnosis not present

## 2024-02-15 DIAGNOSIS — E871 Hypo-osmolality and hyponatremia: Secondary | ICD-10-CM | POA: Diagnosis not present

## 2024-02-15 DIAGNOSIS — E785 Hyperlipidemia, unspecified: Secondary | ICD-10-CM

## 2024-02-15 DIAGNOSIS — J101 Influenza due to other identified influenza virus with other respiratory manifestations: Secondary | ICD-10-CM | POA: Diagnosis not present

## 2024-02-15 LAB — GLUCOSE, CAPILLARY
Glucose-Capillary: 106 mg/dL — ABNORMAL HIGH (ref 70–99)
Glucose-Capillary: 114 mg/dL — ABNORMAL HIGH (ref 70–99)
Glucose-Capillary: 134 mg/dL — ABNORMAL HIGH (ref 70–99)
Glucose-Capillary: 135 mg/dL — ABNORMAL HIGH (ref 70–99)
Glucose-Capillary: 181 mg/dL — ABNORMAL HIGH (ref 70–99)
Glucose-Capillary: 77 mg/dL (ref 70–99)

## 2024-02-15 NOTE — Progress Notes (Signed)
 " Progress Note   Patient: Katrina Ortiz FMW:[id number] DOB: 15-Sep-1955 DOA: 02/08/2024     6 DOS: the patient was seen and examined on 02/15/2024   Brief hospital course:  Clairessa Boulet is a 68 y.o. year old female with medical history of hypertension, hyperlipidemia, type 2 diabetes, ESRD on HD Monday, Tuesday, Wednesday, Friday presenting to the ED with worsening shortness of breath.   Patient reports not feeling well about since 4 days ago with URI symptoms and her facility tested her and she was positive for flu.  She was started on Tamiflu  on 12/15 but her symptoms have persisted.  Given her continued symptoms, she requested to go to the ED for further evaluation. She reports diffuse muscle pains, decreased appetite and malaise. Does not report fevers but states she has chills.      On arrival to the ED patient was noted to be HDS stable.  Lab work and imaging obtained.  CBC without leukocytosis, anemia at baseline, slight thrombocytopenia at 133.  BMP with mild hyponatremia at 131, chronic ESRD changes, significant hypocalcemia at 6.5.  Respiratory panel positive for influenza A.  proBNP checked and greater than 35,000.  Procalcitonin slightly elevated at 0.69.  Lactic acid normal.  Blood cultures obtained.  Chest x-ray shows vascular congestion but no opacity.  Patient given treatment with DuoNeb but given continued respiratory distress, [facility] contacted for admission.     12/21-rapid response was called and dialysis.  Patient was said to have transient LOC after an episode of emesis.  Awake, alert but very lethargic upon arrival.  Noted to be hypotensive in the dialysis unit.  Received 450 mL of IV fluid hydration as well as Reglan  10 mg IV push. Treatment discontinued. Transferred back to medical floor     12/22 -no further episodes of nausea poor oral intake remains poor.  Discussed with nephrology and no renal replacement therapy today since patient is dehydrated.   12/23 - Complains  of constipation.  Had an episode of hypoglycemia   12/24 -hypoglycemia overnight.  Oral intake remains poor   12/25: No further hypoglycemia, slowly improving appetite.  Still feeling weak.  Hoping can go back to her facility tomorrow.   Assessment and Plan:  Diabetes mellitus with complications of ESRD:  Hypoglycemic episodes Initial hyperglycemia was secondary to systemic steroids which have been discontinued Discontinue Lantus  Encourage oral intake Dialysis days are M/Tue/W/F Appreciate nephrology consult for renal replacement therapy  Syncope Transient hypotension Nausea/vomiting Elevated troponin Most likely vasovagal or secondary to hypotension This happened during dialysis Blood pressure improved following IV fluid hydration Continue supportive care with antiemetics CT scan of abdomen and pelvis without contrast showed no acute findings Troponin is flat and is most likely from demand ischemia due to hypotension      COPD with acute exacerbation Most likely triggered by acute influenza infection Patient has completed a course of Tamiflu  with Continue patient inhaled steroids as bronchodilator therapy Patient has completed a course of systemic steroids CT scan of the chest without contrast shows Centrilobular ground glass nodules in the bilateral lower lobes and lingula,compatible with small airway infection/inflammation. Round focus of consolidation in the posterior right lower lobe with associated trace right pleural effusion, favored to represent round atelectasis. Follow-up in 3 months is recommended to establish stability. Continue doxycycline  to complete a 5-day course of therapy Will discontinue Rocephin  due to allergy Continue oxygen supplementation to maintain pulse oximetry greater than 92%   Acute on chronic diastolic dysfunction CHF:  Patient's volume status is primarily managed with hemodialysis. Hold Bumex  Continue carvedilol  and losartan  as blood  pressure tolerates Continue scheduled renal replacement therapy    Hypertension:  Continue carvedilol  and losartan    CAD/Hx of CABG:  Continue carvedilol  and Zetia    Hyperkalemia Improved following renal replacement therapy   Hyponatremia: Most likely secondary to volume overload Improved following dialysis   GERD:  Continue home PPI.     Hypocalcemia Supplement calcium     Obesity BMI 35.50 Complicates overall prognosis and care Lifestyle modification and exercise discussed with patient in detail      Subjective: Patient was eating breakfast when seen today.  Slowly improving appetite.  Still feeling weak.  Physical Exam: Vitals:   02/15/24 0403 02/15/24 0500 02/15/24 0851 02/15/24 1236  BP: (!) 142/60  (!) 158/56 (!) 132/57  Pulse: 67  74 69  Resp: 16  15 15   Temp: 98.6 F (37 C)  98.1 F (36.7 C) 97.8 F (36.6 C)  TempSrc: Oral     SpO2: 98%  97% (!) 87%  Weight:  94 kg    Height:       General.  Obese and frail lady, in no acute distress. Pulmonary.  Lungs clear bilaterally, normal respiratory effort. CV.  Regular rate and rhythm, no JVD, rub or murmur. Abdomen.  Soft, nontender, nondistended, BS positive. CNS.  Alert and oriented .  No focal neurologic deficit. Extremities.  No edema,  pulses intact and symmetrical.  Data Reviewed: Prior data reviewed.  Family Communication: Talked with son on phone.  Disposition: Status is: Inpatient Remains inpatient appropriate because: Discharge planning  Planned Discharge Destination: Skilled nursing facility  DVT prophylaxis.  Subcu heparin  Time spent: 42 minutes  This record has been created using Conservation officer, historic buildings. Errors have been sought and corrected,but may not always be located. Such creation errors do not reflect on the standard of care.   Author: Amaryllis Dare, MD 02/15/2024 1:05 PM  For on call review [email or web address].  "

## 2024-02-15 NOTE — Progress Notes (Signed)
Per Dr Reesa Chew, dc tele monitoring

## 2024-02-15 NOTE — Plan of Care (Signed)

## 2024-02-15 NOTE — Progress Notes (Signed)
 " [facility]  ROUNDING NOTE   Subjective:   68 year old female with past medical history of ESRD on HD MTWF at [facility], hypertension, hyperlipidemia, diabetes mellitus type 2, anemia chronic kidney disease, secondary hyperparathyroidism who came in with URI symptoms and tested positive for the flu.  Update: Son at bedside.  Continues to complain of shortness of breath.   Objective:  Vital signs in last 24 hours:  Temp:  [97.4 F (36.3 C)-98.6 F (37 C)] 98.1 F (36.7 C) (12/25 0851) Pulse Rate:  [67-74] 74 (12/25 0851) Resp:  [15-17] 15 (12/25 0851) BP: (137-158)/(56-86) 158/56 (12/25 0851) SpO2:  [94 %-98 %] 97 % (12/25 0851) Weight:  [94 kg] 94 kg (12/25 0500)  Weight change: 4.8 kg Filed Weights   02/13/24 1121 02/14/24 0500 02/15/24 0500  Weight: 89.2 kg 92.1 kg 94 kg    Intake/Output: I/O last 3 completed shifts: In: 300 [P.O.:300] Out: -    Intake/Output this shift:  No intake/output data recorded.  Physical Exam: General: No acute distress, restless  Head: Normocephalic, atraumatic. Moist oral mucosal membranes  Lungs:  Coarse breath sounds, 2 L [place] O2  Heart: regular  Abdomen:  Soft, nontender, bowel sounds present, obese  Extremities: no peripheral edema.  Neurologic: Awake, alert, following commands  Skin: No acute rash  Access: Right IJ PermCath    Basic Metabolic Panel: Recent Labs  Lab 02/09/24 0620 02/11/24 0522 02/12/24 1014 02/13/24 0759 02/14/24 0606  NA 127* 128* 133* 133* 133*  K 5.7* 5.4* 4.1 4.1 4.3  CL 87* 89* 92* 93* 96*  CO2 20* 19* 25 24 23   GLUCOSE 331* 277* 148* 125* 164*  BUN 82* 73* 54* 66* 43*  CREATININE 5.32* 4.07* 3.69* 4.20* 3.34*  CALCIUM  6.6* 7.3* 7.9* 7.4* 7.0*  PHOS 6.9*  --   --  5.1*  --     Liver Function Tests: Recent Labs  Lab 02/08/24 1825 02/09/24 0620 02/12/24 1014 02/13/24 0759  AST  --   --  21  --   ALT  --   --  16  --   ALKPHOS  --   --  169*  --   BILITOT  --   --  0.4   --   PROT  --   --  6.4*  --   ALBUMIN  3.5 3.5 3.6 3.8   No results for input(s): LIPASE, AMYLASE in the last 168 hours. No results for input(s): AMMONIA in the last 168 hours.  CBC: Recent Labs  Lab 02/09/24 0620 02/09/24 1200 02/11/24 0522 02/12/24 1014 02/13/24 0759  WBC 3.7* 5.2 5.5 8.1 7.4  NEUTROABS  --   --   --  6.4  --   HGB 10.5* 11.0* 12.8 12.3 11.3*  HCT 32.9* 33.7* 38.6 38.4 35.1*  MCV 90.1 88.7 88.7 88.5 89.3  PLT 129* 155 130* 154 155    Cardiac Enzymes: No results for input(s): CKTOTAL, CKMB, CKMBINDEX, TROPONINI in the last 168 hours.  BNP: Invalid input(s): POCBNP  CBG: Recent Labs  Lab 02/14/24 1614 02/14/24 2003 02/14/24 2331 02/15/24 0359 02/15/24 0854  GLUCAP 91 224* 156* 114* 77    Microbiology: Results for orders placed or performed during the hospital encounter of 02/08/24  Resp panel by RT-PCR (RSV, Flu A&B, Covid) Anterior Nasal Swab     Status: Abnormal   Collection Time: 02/08/24  8:45 AM   Specimen: Anterior Nasal Swab  Result Value Ref Range Status   SARS Coronavirus 2 by  RT PCR NEGATIVE NEGATIVE Final    Comment: (NOTE) SARS-CoV-2 target nucleic acids are NOT DETECTED.  The SARS-CoV-2 RNA is generally detectable in upper respiratory specimens during the acute phase of infection. The lowest concentration of SARS-CoV-2 viral copies this assay can detect is 138 copies/mL. A negative result does not preclude SARS-Cov-2 infection and should not be used as the sole basis for treatment or other patient management decisions. A negative result may occur with  improper specimen collection/handling, submission of specimen other than nasopharyngeal swab, presence of viral mutation(s) within the areas targeted by this assay, and inadequate number of viral copies(<138 copies/mL). A negative result must be combined with clinical observations, patient history, and epidemiological information. The expected result is  Negative.  Fact Sheet for Patients:  bloggercourse.com  Fact Sheet for Healthcare Providers:  seriousbroker.it  This test is no t yet approved or cleared by the [place]  FDA and  has been authorized for detection and/or diagnosis of SARS-CoV-2 by FDA under an Emergency Use Authorization (EUA). This EUA will remain  in effect (meaning this test can be used) for the duration of the COVID-19 declaration under Section 564(b)(1) of the Act, 21 U.S.C.section 360bbb-3(b)(1), unless the authorization is terminated  or revoked sooner.       Influenza A by PCR POSITIVE (A) NEGATIVE Final   Influenza B by PCR NEGATIVE NEGATIVE Final    Comment: (NOTE) The Xpert Xpress SARS-CoV-2/FLU/RSV plus assay is intended as an aid in the diagnosis of influenza from Nasopharyngeal swab specimens and should not be used as a sole basis for treatment. Nasal washings and aspirates are unacceptable for Xpert Xpress SARS-CoV-2/FLU/RSV testing.  Fact Sheet for Patients: bloggercourse.com  Fact Sheet for Healthcare Providers: seriousbroker.it  This test is not yet approved or cleared by the [place]  FDA and has been authorized for detection and/or diagnosis of SARS-CoV-2 by FDA under an Emergency Use Authorization (EUA). This EUA will remain in effect (meaning this test can be used) for the duration of the COVID-19 declaration under Section 564(b)(1) of the Act, 21 U.S.C. section 360bbb-3(b)(1), unless the authorization is terminated or revoked.     Resp Syncytial Virus by PCR NEGATIVE NEGATIVE Final    Comment: (NOTE) Fact Sheet for Patients: bloggercourse.com  Fact Sheet for Healthcare Providers: seriousbroker.it  This test is not yet approved or cleared by the [place]  FDA and has been authorized for detection and/or diagnosis of  SARS-CoV-2 by FDA under an Emergency Use Authorization (EUA). This EUA will remain in effect (meaning this test can be used) for the duration of the COVID-19 declaration under Section 564(b)(1) of the Act, 21 U.S.C. section 360bbb-3(b)(1), unless the authorization is terminated or revoked.  Performed at [facility], [address] [place], [place] [address]   Blood Culture (routine x 2)     Status: None   Collection Time: 02/08/24  8:45 AM   Specimen: BLOOD  Result Value Ref Range Status   Specimen Description BLOOD BLOOD LEFT HAND  Final   Special Requests   Final    BOTTLES DRAWN AEROBIC AND ANAEROBIC Blood Culture adequate volume   Culture   Final    NO GROWTH 5 DAYS Performed at [facility], [address] [place], [place] [address]    Report Status 02/13/2024 FINAL  Final  Blood Culture (routine x 2)     Status: None   Collection Time: 02/08/24  8:50 AM   Specimen: BLOOD  Result Value Ref Range Status  Specimen Description BLOOD BLOOD LEFT ARM  Final   Special Requests   Final    BOTTLES DRAWN AEROBIC AND ANAEROBIC Blood Culture results may not be optimal due to an inadequate volume of blood received in culture bottles   Culture   Final    NO GROWTH 5 DAYS Performed at [facility], [address] [place], [place] [address]    Report Status 02/13/2024 FINAL  Final  MRSA Next Gen by PCR, Nasal     Status: Abnormal   Collection Time: 02/08/24  5:02 PM   Specimen: Nasal Mucosa; Nasal Swab  Result Value Ref Range Status   MRSA by PCR Next Gen DETECTED (A) NOT DETECTED Final    Comment: RESULT CALLED TO, READ BACK BY AND VERIFIED WITH: Fleming Kitty @2057  on 02/08/24 skl (NOTE) The GeneXpert MRSA Assay (FDA approved for NASAL specimens only), is one component of a comprehensive MRSA colonization surveillance program. It is not intended to diagnose MRSA infection nor to guide or monitor treatment for MRSA infections. Test performance is  not FDA approved in patients less than 79 years old. Performed at [facility], [address] [place], [place] [address]     Coagulation Studies: No results for input(s): LABPROT, INR in the last 72 hours.   Urinalysis: No results for input(s): COLORURINE, LABSPEC, PHURINE, GLUCOSEU, HGBUR, BILIRUBINUR, KETONESUR, PROTEINUR, UROBILINOGEN, NITRITE, LEUKOCYTESUR in the last 72 hours.  Invalid input(s): APPERANCEUR    Imaging: No results found.    Medications:      benzonatate   100 mg Oral TID   carvedilol   6.25 mg Oral BID WC   Chlorhexidine  Gluconate Cloth  6 each Topical Q0600   guaiFENesin   1,200 mg Oral BID   heparin  injection (subcutaneous)  5,000 Units Subcutaneous Q8H   hydrALAZINE   5 mg Intravenous Once   linaclotide   145 mcg Oral QPM   losartan   50 mg Oral Daily   pantoprazole   40 mg Oral Daily   sodium chloride  flush  3 mL Intravenous Q12H   acetaminophen  **OR** acetaminophen , dextrose , hydrOXYzine , ipratropium-albuterol , labetalol , ondansetron  **OR** ondansetron  (ZOFRAN ) IV, senna-docusate  Assessment/ Plan:  68 y.o. female ESRD on HD M TTS at [facility], hypertension, upper lipidemia, diabetes mellitus type 2, anemia chronic kidney disease, secondary hyperparathyroidism, GERD who presented with upper respiratory infections and was found to have influenza virus.  1.  ESRD on HD MTWF:   Next treatment scheduled for Friday  2.  Hypertension.  Maintain the patient on carvedilol  and losartan  for hypertension control.   3.  Secondary hyperparathyroidism.  Patient receives cholecalciferol  outpatient. Hypocalcemia decreased likely due to septic reaction. - restarted cholecalciferol .    4.  Anemia of chronic disease.  Hemoglobin 11.3. ESA as outpatient.      LOS: 6 Hashim Eichhorst 12/25/202511:22 AM   "

## 2024-02-16 DIAGNOSIS — L8990 Pressure ulcer of unspecified site, unspecified stage: Secondary | ICD-10-CM | POA: Insufficient documentation

## 2024-02-16 DIAGNOSIS — J441 Chronic obstructive pulmonary disease with (acute) exacerbation: Secondary | ICD-10-CM | POA: Diagnosis not present

## 2024-02-16 DIAGNOSIS — J101 Influenza due to other identified influenza virus with other respiratory manifestations: Secondary | ICD-10-CM | POA: Diagnosis not present

## 2024-02-16 DIAGNOSIS — N186 End stage renal disease: Secondary | ICD-10-CM | POA: Diagnosis not present

## 2024-02-16 DIAGNOSIS — R0600 Dyspnea, unspecified: Secondary | ICD-10-CM | POA: Diagnosis not present

## 2024-02-16 LAB — RENAL FUNCTION PANEL
Albumin: 3.5 g/dL (ref 3.5–5.0)
Anion gap: 16 — ABNORMAL HIGH (ref 5–15)
BUN: 58 mg/dL — ABNORMAL HIGH (ref 8–23)
CO2: 22 mmol/L (ref 22–32)
Calcium: 6.6 mg/dL — ABNORMAL LOW (ref 8.9–10.3)
Chloride: 95 mmol/L — ABNORMAL LOW (ref 98–111)
Creatinine, Ser: 4.48 mg/dL — ABNORMAL HIGH (ref 0.44–1.00)
GFR, Estimated: 10 mL/min — ABNORMAL LOW
Glucose, Bld: 120 mg/dL — ABNORMAL HIGH (ref 70–99)
Phosphorus: 5.1 mg/dL — ABNORMAL HIGH (ref 2.5–4.6)
Potassium: 5.4 mmol/L — ABNORMAL HIGH (ref 3.5–5.1)
Sodium: 132 mmol/L — ABNORMAL LOW (ref 135–145)

## 2024-02-16 LAB — CBC
HCT: 32 % — ABNORMAL LOW (ref 36.0–46.0)
Hemoglobin: 10 g/dL — ABNORMAL LOW (ref 12.0–15.0)
MCH: 28.8 pg (ref 26.0–34.0)
MCHC: 31.3 g/dL (ref 30.0–36.0)
MCV: 92.2 fL (ref 80.0–100.0)
Platelets: 102 K/uL — ABNORMAL LOW (ref 150–400)
RBC: 3.47 MIL/uL — ABNORMAL LOW (ref 3.87–5.11)
RDW: 15 % (ref 11.5–15.5)
WBC: 6.6 K/uL (ref 4.0–10.5)
nRBC: 0 % (ref 0.0–0.2)

## 2024-02-16 LAB — GLUCOSE, CAPILLARY
Glucose-Capillary: 154 mg/dL — ABNORMAL HIGH (ref 70–99)
Glucose-Capillary: 118 mg/dL — ABNORMAL HIGH (ref 70–99)
Glucose-Capillary: 154 mg/dL — ABNORMAL HIGH (ref 70–99)

## 2024-02-16 MED ORDER — LEVOFLOXACIN 750 MG PO TABS
750.0000 mg | ORAL_TABLET | Freq: Every day | ORAL | Status: AC
  Administered 2024-02-16: 750 mg via ORAL
  Filled 2024-02-16: qty 1

## 2024-02-16 MED ORDER — IPRATROPIUM-ALBUTEROL 0.5-2.5 (3) MG/3ML IN SOLN: 3.0000 mL | Freq: Four times a day (QID) | RESPIRATORY_TRACT | Status: AC | PRN

## 2024-02-16 MED ORDER — LEVOFLOXACIN 500 MG PO TABS
500.0000 mg | ORAL_TABLET | ORAL | Status: DC
  Filled 2024-02-16: qty 1

## 2024-02-16 MED ORDER — NEPRO/CARBSTEADY PO LIQD: 237.0000 mL | Freq: Three times a day (TID) | ORAL | Status: AC

## 2024-02-16 MED ORDER — NEPRO/CARBSTEADY PO LIQD
237.0000 mL | Freq: Three times a day (TID) | ORAL | Status: DC
  Administered 2024-02-16: 237 mL via ORAL

## 2024-02-16 MED ORDER — DM-GUAIFENESIN ER 30-600 MG PO TB12: 1.0000 | ORAL_TABLET | Freq: Two times a day (BID) | ORAL | Status: AC | PRN

## 2024-02-16 MED ORDER — ONDANSETRON HCL 4 MG/2ML IJ SOLN
INTRAMUSCULAR | Status: AC
  Filled 2024-02-16: qty 2

## 2024-02-16 MED ORDER — HEPARIN SODIUM (PORCINE) 1000 UNIT/ML IJ SOLN
INTRAMUSCULAR | Status: AC
  Filled 2024-02-16: qty 3

## 2024-02-16 MED ORDER — LEVOFLOXACIN 500 MG PO TABS: 500.0000 mg | ORAL_TABLET | ORAL | Status: AC

## 2024-02-16 MED ORDER — BENZONATATE 100 MG PO CAPS: 100.0000 mg | ORAL_CAPSULE | Freq: Two times a day (BID) | ORAL | Status: AC | PRN

## 2024-02-16 NOTE — Discharge Summary (Signed)
 " Physician Discharge Summary   Patient: Katrina Ortiz MRN: [id number] DOB: 10/25/1955  Admit date:     02/08/2024  Discharge date: 02/16/2024  Discharge Physician: Amaryllis Dare   PCP: Relda Heron LABOR, MD   Recommendations at discharge:  Please obtain CBC and BMP and follow-up Please encourage regular meal and Nepro as supplement in between as appetite is still poor. Continue using Mucinex  and pulmonary toileting to help with the cough. Patient was started on Levaquin , received first dose before discharge and should continue every 48 hours for 5 days, next dose will be on Sunday. Patient completed the course of antibiotics and Tamiflu . 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 *  Hospital Course: Katrina Ortiz is a 68 y.o. year old female with medical history of hypertension, hyperlipidemia, type 2 diabetes, ESRD on HD Monday, Tuesday, Wednesday, Friday presenting to the ED with worsening shortness of breath.   Patient reports not feeling well about since 4 days ago with URI symptoms and her facility tested her and she was positive for flu.  She was started on Tamiflu  on 12/15 but her symptoms have persisted.  Given her continued symptoms, she requested to go to the ED for further evaluation. She reports diffuse muscle pains, decreased appetite and malaise. Does not report fevers but states she has chills.      On arrival to the ED patient was  noted to be HDS stable.  Lab work and imaging obtained.  CBC without leukocytosis, anemia at baseline, slight thrombocytopenia at 133.  BMP with mild hyponatremia at 131, chronic ESRD changes, significant hypocalcemia at 6.5.  Respiratory panel positive for influenza A.  proBNP checked and greater than 35,000.  Procalcitonin slightly elevated at 0.69.  Lactic acid normal.  Blood cultures obtained.  Chest x-ray shows vascular congestion but no opacity.  Patient given treatment with DuoNeb but given continued respiratory distress, [facility] contacted for admission.  CT scan of the chest without contrast shows Centrilobular ground glass nodules in the bilateral lower lobes and lingula,compatible with small airway infection/inflammation. Round focus of consolidation in the posterior right lower lobe with associated trace right pleural effusion, favored to represent round atelectasis. Follow-up in 3 months is recommended to establish stability.  Patient completed a course of Tamiflu  and doxycycline .  Continue to feel weak and having cough.  Patient was started on a 5-day course of Levaquin  which she will be taking every 48 hours at her facility.  She was given dose before discharge and should receive the next dose on Sunday.  She will continue with Mucinex  as needed and should get some pulmonary toileting to help.  Remained afebrile and no leukocytosis.  Labs otherwise seems stable.  There was also concern of acute on chronic diastolic dysfunction with significantly elevated proBNP.  Volume is being managed with dialysis and Bumex  was discontinued.  She did develop intermittent hyperkalemia and mild hyponatremia which was being managed with dialysis.  She was  also started on Nepro as supplement and encourage nutrition.  She will continue her current medications and need to have a close follow-up with her providers for further assistance.       Consultants: Nephrology Procedures performed:  Hemodialysis Disposition: Skilled nursing facility Diet recommendation:  Renal diet DISCHARGE MEDICATION: Allergies as of 02/16/2024       Reactions   Rocephin  [ceftriaxone ] Itching        Medication List     STOP taking these medications    bumetanide  1 MG tablet Commonly known as: BUMEX    guaiFENesin  600 MG 12 hr tablet Commonly known as: MUCINEX    insulin  aspart 100 UNIT/ML injection Commonly known as: novoLOG    lactulose  10 GM/15ML solution Commonly known as: CHRONULAC    Lantus  SoloStar 100 UNIT/ML Solostar Pen Generic drug: insulin  glargine   oseltamivir  30 MG capsule Commonly known as: TAMIFLU        TAKE these medications    acetaminophen  325 MG tablet Commonly known as: TYLENOL  Take 2 tablets (650 mg total) by mouth every 6 (six) hours as needed for mild pain (pain score 1-3).   Acidophilus Chew Chew 1 tablet by mouth daily.   artificial tears ophthalmic solution 1 drop at bedtime.   aspirin  EC 81 MG tablet Take 1 tablet by mouth daily.   Benzocaine-Menthol 15-10 MG Lozg Use as directed 1 lozenge in the mouth or throat every 2 (two) hours as needed.   benzonatate  100 MG capsule Commonly known as: TESSALON  Take 1 capsule (100 mg total) by mouth 2 (two) times daily as needed for cough.   Carboxymethylcellulose Sodium 1 % Gel Place 1 drop into both eyes at bedtime.   carvedilol  6.25 MG tablet Commonly known as: COREG  Take 6.25 mg by mouth 2 (two) times daily.   cetirizine 10 MG tablet Commonly known as: ZYRTEC Take 5 mg by mouth daily.   cholecalciferol  25 MCG (1000 UNIT) tablet Commonly known as: VITAMIN D3 Take 1,000 Units by mouth daily.   Cranberry 450 MG Tabs Take 1 tablet by mouth daily.   Decubi-Vite Caps Take 1 capsule by mouth every morning.   dextromethorphan -guaiFENesin  30-600 MG 12hr tablet Commonly known as: MUCINEX  DM Take 1 tablet by mouth 2 (two) times daily as needed.   diclofenac  Sodium 1 % Gel Commonly known  as: VOLTAREN  Apply 4 g topically 3 (three) times daily. Bilateral knees   ezetimibe  10 MG tablet Commonly known as: ZETIA  Take 10 mg by mouth daily.   feeding supplement (NEPRO CARB STEADY) Liqd Take 237 mLs by mouth 3 (three) times daily with meals.   ferrous sulfate  325 (65 FE) MG EC tablet Take 325 mg by mouth every other day.   fluticasone  50 MCG/ACT nasal spray Commonly known as: FLONASE  Place 1 spray into both nostrils 2 (two) times daily.   gabapentin  100 MG capsule Commonly known as: NEURONTIN  Take 100 mg by mouth 2 (two) times daily.   HumaLOG  KwikPen 100 UNIT/ML KwikPen Generic drug: insulin  lispro Inject into the skin 3 (three) times daily. Sliding scale   ipratropium-albuterol  0.5-2.5 (3) MG/3ML Soln Commonly known as: DUONEB Take 3 mLs by nebulization every 6 (six) hours as needed.   latanoprost  0.005 % ophthalmic solution Commonly known as: XALATAN  Place 1 drop into both eyes at bedtime.   levofloxacin  500 MG tablet Commonly known as: LEVAQUIN  Take 1 tablet (500 mg total) by mouth every other day for 5 days. First dose on Sunday, already received today's dose before discharge  linaclotide  145 MCG Caps capsule Commonly known as: LINZESS  Take 1 capsule (145 mcg total) by mouth every evening. Dinner timem.   losartan  50 MG tablet Commonly known as: COZAAR  Take 1 tablet (50 mg total) by mouth every evening.   methocarbamol  500 MG tablet Commonly known as: ROBAXIN  Take 1,000 mg by mouth every 8 (eight) hours as needed.   montelukast  10 MG tablet Commonly known as: SINGULAIR  Take 10 mg by mouth daily.   nitroGLYCERIN  0.4 MG SL tablet Commonly known as: NITROSTAT  Place 0.4 mg under the tongue every 5 (five) minutes as needed for chest pain.   nystatin powder Apply 1 Application topically 2 (two) times daily. Apply under breast   omeprazole 40 MG capsule Commonly known as: PRILOSEC Take 40 mg by mouth 2 (two) times daily.   ondansetron  4 MG  tablet Commonly known as: ZOFRAN  Take 1 tablet (4 mg total) by mouth every 8 (eight) hours as needed for nausea or vomiting. What changed: when to take this   polyethylene glycol 17 g packet Commonly known as: MIRALAX  / GLYCOLAX  Take 17 g by mouth 2 (two) times daily. Morning and at dinner time.   senna-docusate 8.6-50 MG tablet Commonly known as: Senokot-S Take 2 tablets by mouth at bedtime.   sevelamer carbonate 0.8 g Pack packet Commonly known as: RENVELA Take 1 packet by mouth 3 (three) times daily with meals.   simethicone  80 MG chewable tablet Commonly known as: MYLICON Chew 80 mg by mouth every 6 (six) hours as needed for flatulence.   triamcinolone ointment 0.1 % Commonly known as: KENALOG Apply 1 Application topically 2 (two) times daily. Apply to buttocks 12/13-12/20   zinc  oxide 20 % ointment Apply 1 Application topically as needed for irritation.        Follow-up Information     [place], Heron LABOR, MD. Schedule an appointment as soon as possible for a visit in 1 week(s).   Specialty: Pediatrics Contact information: [address] [facility] [place] [address] [phone]                Discharge Exam: Fredricka Weights   02/16/24 0431 02/16/24 0737 02/16/24 1130  Weight: 91.9 kg 90.1 kg 88.8 kg   General.  Chronically ill-appearing, obese lady, in no acute distress. Pulmonary.  Lungs clear bilaterally, normal respiratory effort. CV.  Regular rate and rhythm, no JVD, rub or murmur. Abdomen.  Soft, nontender, nondistended, BS positive. CNS.  Alert and oriented .  No focal neurologic deficit. Extremities.  No edema,  pulses intact and symmetrical. Psychiatry.  Judgment and insight appears normal.   Condition at discharge: stable  The results of significant diagnostics from this hospitalization (including imaging, microbiology, ancillary and laboratory) are listed below for reference.   Imaging Studies: CT ABDOMEN PELVIS WO  CONTRAST Result Date: 02/11/2024 CLINICAL DATA:  Abdomen pain EXAM: CT ABDOMEN AND PELVIS WITHOUT CONTRAST TECHNIQUE: Multidetector CT imaging of the abdomen and pelvis was performed following the standard protocol without IV contrast. RADIATION DOSE REDUCTION: This exam was performed according to the departmental dose-optimization program which includes automated exposure control, adjustment of the mA and/or kV according to patient size and/or use of iterative reconstruction technique. COMPARISON:  CT 10/17/2023, 08/28/2023, 04/14/2021, 06/02/2023 FINDINGS: Lower chest: Lung bases demonstrate trace right-sided chronic pleural effusion with rim thickening. Chronic rounded area of consolidation in the right lower lobe, stable since April 2025 and favored to represent round atelectasis. Chronic scarring within the left lower lobe.  Cardiomegaly and coronary vascular calcification Hepatobiliary: Sludge and gallstones. No right upper quadrant inflammation. No focal hepatic abnormality or biliary dilatation Pancreas: Unremarkable. No pancreatic ductal dilatation or surrounding inflammatory changes. Spleen: Normal in size without focal abnormality. Adrenals/Urinary Tract: Adrenal glands are normal. Mild cortical atrophy of kidneys. Nonspecific perinephric stranding. No hydronephrosis. Small focus of gas within the urinary bladder Stomach/Bowel: Stomach within normal limits. No dilated small bowel. No acute bowel wall thickening. Negative appendix Vascular/Lymphatic: Aortic atherosclerosis. No enlarged abdominal or pelvic lymph nodes. Reproductive: Bilateral adnexal clips. Suspicion of uterine fibroids. No adnexal mass Other: No ascites or free air. Left flank edema. Skin thickening and subcutaneous infiltration of left abdominal pannus, slightly increased compared to prior. Small focus of gas within the right subcutaneous abdominal fat possibly due to recent injection Musculoskeletal: No acute osseous abnormality.  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 No CT evidence for acute intra-abdominal or pelvic abnormality. 2. Chronic trace right-sided pleural effusion with rim thickening and chronic rounded area of consolidation in the right lower lobe, favored to represent round atelectasis. 3. Gallstones and sludge but no right upper quadrant inflammation. 4. Small focus of gas within the urinary bladder, correlate for recent instrumentation. 5. Skin thickening and subcutaneous infiltration of left lateral abdominal pannus, slightly increased compared to prior, correlate for cellulitis or contusive change. 6. Aortic atherosclerosis. Aortic Atherosclerosis (ICD10-I70.0). Electronically Signed   By: Luke Bun M.D.   On: 02/11/2024 16:38   CT CHEST WO CONTRAST Result Date: 02/09/2024 EXAM: CT CHEST WITHOUT CONTRAST 02/09/2024 04:55:47 PM TECHNIQUE: CT of the chest was performed without the administration of intravenous contrast. Multiplanar reformatted images are provided for review. Automated exposure control, iterative reconstruction, and/or weight based adjustment of the mA/kV was utilized to reduce the radiation dose to as low as reasonably achievable. COMPARISON: Comparison with x-ray 02/08/2024 and CT 04/14/2021. CLINICAL HISTORY: Pneumonia suspected, uncomplicated, no prior imaging (Ped >= 22mo). FINDINGS: MEDIASTINUM: Right IJ CVC tip in the right atrium. Coronary artery and aortic atherosclerotic calcification. Sternotomy. The central airways are clear. LYMPH NODES: Borderline mediastinal lymphadenopathy. For example, the precarinal node on series 2, image 51, measures 11 mm. This is not substantially changed from 04/14/2021. Evaluation for hilar lymphadenopathy is limited without IV contrast. LUNGS AND PLEURA: A round focus of consolidation in the posterior right lower lobe with associated trace right pleural effusion is favored to represent round atelectasis. Follow-up in 3 months is  recommended to establish stability. Centrilobular ground glass nodules in the bilateral lower lobes and lingula compatible with small airway infection/inflammation. No pneumothorax. SOFT TISSUES/BONES: Sternotomy. No acute abnormality of the bones or soft tissues. UPPER ABDOMEN: Limited images of the upper abdomen demonstrates no acute abnormality. IMPRESSION: 1. Centrilobular ground glass nodules in the bilateral lower lobes and lingula, compatible with small airway infection/inflammation. 2. Round focus of consolidation in the posterior right lower lobe with associated trace right pleural effusion, favored to represent round atelectasis. Follow-up in 3 months is recommended to establish stability. Electronically signed by: Norman Gatlin MD 02/09/2024 09:32 PM EST RP Workstation: [id number]   DG Chest 2 View Result Date: 02/08/2024 CLINICAL DATA:  Dyspnea. EXAM: CHEST - 2 VIEW COMPARISON:  10/20/2023 FINDINGS: Sternotomy wires and right IJ dialysis catheter unchanged. Lungs are adequately inflated without lobar consolidation. No significant effusion. Possible mild vascular congestion. Mild stable cardiomegaly. Remainder of the exam is unchanged. IMPRESSION: Mild stable cardiomegaly with possible mild vascular congestion. Electronically Signed   By: Toribio Agreste M.D.  On: 02/08/2024 08:50    Microbiology: Results for orders placed or performed during the hospital encounter of 02/08/24  Resp panel by RT-PCR (RSV, Flu A&B, Covid) Anterior Nasal Swab     Status: Abnormal   Collection Time: 02/08/24  8:45 AM   Specimen: Anterior Nasal Swab  Result Value Ref Range Status   SARS Coronavirus 2 by RT PCR NEGATIVE NEGATIVE Final    Comment: (NOTE) SARS-CoV-2 target nucleic acids are NOT DETECTED.  The SARS-CoV-2 RNA is generally detectable in upper respiratory specimens during the acute phase of infection. The lowest concentration of SARS-CoV-2 viral copies this assay can detect is 138 copies/mL. A  negative result does not preclude SARS-Cov-2 infection and should not be used as the sole basis for treatment or other patient management decisions. A negative result may occur with  improper specimen collection/handling, submission of specimen other than nasopharyngeal swab, presence of viral mutation(s) within the areas targeted by this assay, and inadequate number of viral copies(<138 copies/mL). A negative result must be combined with clinical observations, patient history, and epidemiological information. The expected result is Negative.  Fact Sheet for Patients:  bloggercourse.com  Fact Sheet for Healthcare Providers:  seriousbroker.it  This test is no t yet approved or cleared by the [place]  FDA and  has been authorized for detection and/or diagnosis of SARS-CoV-2 by FDA under an Emergency Use Authorization (EUA). This EUA will remain  in effect (meaning this test can be used) for the duration of the COVID-19 declaration under Section 564(b)(1) of the Act, 21 U.S.C.section 360bbb-3(b)(1), unless the authorization is terminated  or revoked sooner.       Influenza A by PCR POSITIVE (A) NEGATIVE Final   Influenza B by PCR NEGATIVE NEGATIVE Final    Comment: (NOTE) The Xpert Xpress SARS-CoV-2/FLU/RSV plus assay is intended as an aid in the diagnosis of influenza from Nasopharyngeal swab specimens and should not be used as a sole basis for treatment. Nasal washings and aspirates are unacceptable for Xpert Xpress SARS-CoV-2/FLU/RSV testing.  Fact Sheet for Patients: bloggercourse.com  Fact Sheet for Healthcare Providers: seriousbroker.it  This test is not yet approved or cleared by the [place]  FDA and has been authorized for detection and/or diagnosis of SARS-CoV-2 by FDA under an Emergency Use Authorization (EUA). This EUA will remain in effect (meaning this test  can be used) for the duration of the COVID-19 declaration under Section 564(b)(1) of the Act, 21 U.S.C. section 360bbb-3(b)(1), unless the authorization is terminated or revoked.     Resp Syncytial Virus by PCR NEGATIVE NEGATIVE Final    Comment: (NOTE) Fact Sheet for Patients: bloggercourse.com  Fact Sheet for Healthcare Providers: seriousbroker.it  This test is not yet approved or cleared by the [place]  FDA and has been authorized for detection and/or diagnosis of SARS-CoV-2 by FDA under an Emergency Use Authorization (EUA). This EUA will remain in effect (meaning this test can be used) for the duration of the COVID-19 declaration under Section 564(b)(1) of the Act, 21 U.S.C. section 360bbb-3(b)(1), unless the authorization is terminated or revoked.  Performed at [facility], [address] [place], [place] [address]   Blood Culture (routine x 2)     Status: None   Collection Time: 02/08/24  8:45 AM   Specimen: BLOOD  Result Value Ref Range Status   Specimen Description BLOOD BLOOD LEFT HAND  Final   Special Requests   Final    BOTTLES DRAWN AEROBIC AND ANAEROBIC Blood Culture adequate volume  Culture   Final    NO GROWTH 5 DAYS Performed at [facility], [address] [place], [place] [address]    Report Status 02/13/2024 FINAL  Final  Blood Culture (routine x 2)     Status: None   Collection Time: 02/08/24  8:50 AM   Specimen: BLOOD  Result Value Ref Range Status   Specimen Description BLOOD BLOOD LEFT ARM  Final   Special Requests   Final    BOTTLES DRAWN AEROBIC AND ANAEROBIC Blood Culture results may not be optimal due to an inadequate volume of blood received in culture bottles   Culture   Final    NO GROWTH 5 DAYS Performed at [facility], [address], [place], [place] [address]    Report Status 02/13/2024 FINAL  Final  MRSA Next Gen by PCR, Nasal     Status:  Abnormal   Collection Time: 02/08/24  5:02 PM   Specimen: Nasal Mucosa; Nasal Swab  Result Value Ref Range Status   MRSA by PCR Next Gen DETECTED (A) NOT DETECTED Final    Comment: RESULT CALLED TO, READ BACK BY AND VERIFIED WITH: Fleming Kitty @2057  on 02/08/24 skl (NOTE) The GeneXpert MRSA Assay (FDA approved for NASAL specimens only), is one component of a comprehensive MRSA colonization surveillance program. It is not intended to diagnose MRSA infection nor to guide or monitor treatment for MRSA infections. Test performance is not FDA approved in patients less than 93 years old. Performed at [facility], [address] [place], [place] [address]     Labs: CBC: Recent Labs  Lab 02/09/24 1200 02/11/24 0522 02/12/24 1014 02/13/24 0759 02/16/24 0805  WBC 5.2 5.5 8.1 7.4 6.6  NEUTROABS  --   --  6.4  --   --   HGB 11.0* 12.8 12.3 11.3* 10.0*  HCT 33.7* 38.6 38.4 35.1* 32.0*  MCV 88.7 88.7 88.5 89.3 92.2  PLT 155 130* 154 155 102*   Basic Metabolic Panel: Recent Labs  Lab 02/11/24 0522 02/12/24 1014 02/13/24 0759 02/14/24 0606 02/16/24 0805  NA 128* 133* 133* 133* 132*  K 5.4* 4.1 4.1 4.3 5.4*  CL 89* 92* 93* 96* 95*  CO2 19* 25 24 23 22   GLUCOSE 277* 148* 125* 164* 120*  BUN 73* 54* 66* 43* 58*  CREATININE 4.07* 3.69* 4.20* 3.34* 4.48*  CALCIUM  7.3* 7.9* 7.4* 7.0* 6.6*  PHOS  --   --  5.1*  --  5.1*   Liver Function Tests: Recent Labs  Lab 02/12/24 1014 02/13/24 0759 02/16/24 0805  AST 21  --   --   ALT 16  --   --   ALKPHOS 169*  --   --   BILITOT 0.4  --   --   PROT 6.4*  --   --   ALBUMIN  3.6 3.8 3.5   CBG: Recent Labs  Lab 02/15/24 1238 02/15/24 1630 02/15/24 2025 02/15/24 2354 02/16/24 0424  GLUCAP 106* 134* 181* 135* 154*    Discharge time spent: greater than 30 minutes.  This record has been created using Conservation officer, historic buildings. Errors have been sought and corrected,but may not always be located. Such creation  errors do not reflect on the standard of care.   Signed: Amaryllis Dare, MD [facility] 02/16/2024 "

## 2024-02-16 NOTE — Progress Notes (Signed)
 Notified by renal NP of pt's d/c back to [facility] today. Contacted Briana at [facility] to ensure nothing is needed from hospital for pt to resume HD at on-site HD unit at [facility]. Ashley confirms nothing is needed from dialysis navigator and that Ashley will notify HD staff of pt's return to [facility] and resumption of pt's HD upon pt's return.   Randine Mungo Dialysis Navigator (coverage) [phone]

## 2024-02-16 NOTE — Progress Notes (Signed)
" °   02/16/24 1106  Vitals  Temp 97.9 F (36.6 C)  Temp Source Oral  BP (!) 182/76  MAP (mmHg) 107  BP Location Left Arm  BP Method Automatic  Patient Position (if appropriate) Lying  Pulse Rate 76  Pulse Rate Source Monitor  ECG Heart Rate 77  Resp 17  Oxygen Therapy  SpO2 100 %  O2 Device Nasal Cannula  O2 Flow Rate (L/min) 2.5 L/min  Patient Activity (if Appropriate) In bed  Pulse Oximetry Type Continuous  During Treatment Monitoring  Blood Flow Rate (mL/min) 309 mL/min  Arterial Pressure (mmHg) -160.8 mmHg  Venous Pressure (mmHg) 138.18 mmHg  TMP (mmHg) 22.22 mmHg  Ultrafiltration Rate (mL/min) 927 mL/min  Dialysate Flow Rate (mL/min) 300 ml/min  Dialysate Potassium Concentration 3  Dialysate Calcium  Concentration 2.5  Duration of HD Treatment -hour(s) 3 hour(s)  Cumulative Fluid Removed (mL) per Treatment  900.16  HD Safety Checks Performed Yes  Intra-Hemodialysis Comments Tx completed  Post Treatment  Dialyzer Clearance Clear  Hemodialysis Intake (mL) 0 mL  Liters Processed 56.2  Fluid Removed (mL) 900 mL  Tolerated HD Treatment Yes  AVG/AVF Arterial Site Held (minutes) 0 minutes  AVG/AVF Venous Site Held (minutes) 0 minutes  Hemodialysis Catheter Right Subclavian  No placement date or time found.   Orientation: Right  Access Location: Subclavian  Site Condition No complications  Blue Lumen Status Infusing  Red Lumen Status Infusing  Purple Lumen Status N/A  Catheter fill solution Heparin  1000 units/ml  Catheter fill volume (Arterial) 1.6 cc  Catheter fill volume (Venous) 1.6  Dressing Type Transparent  Dressing Status Antimicrobial disc/dressing in place;Clean, Dry, Intact  Interventions New dressing;Dressing changed;Antimicrobial disc changed  Drainage Description None  Dressing Change Due 02/23/24  Post treatment catheter status Capped and Clamped   Tolerated tx well. Pt removed 900 ml. Pt c/o pain when coughing in her chest. Meds given, tylenol ,  Zofran . Report given to floor nurse. "

## 2024-02-16 NOTE — Progress Notes (Signed)
 Pt report called to [facility] all questions answered

## 2024-02-16 NOTE — TOC Transition Note (Signed)
 Transition of Care [facility]) - Discharge Note   Patient Details  Name: Katrina Ortiz MRN: [id number] Date of Birth: 1955-10-26  Transition of Care [facility]) CM/SW Contact:  Daved JONETTA Hamilton, RN Phone Number: 02/16/2024, 1:30 PM   Clinical Narrative:     Patient will DC to: [facility]  Anticipated DC date: 02/16/2024 Family notified: Norleen Hummer Transport by: [place]  Per MD patient ready for DC to [facility]. RN, patient, patient's family, and facility notified of DC. Discharge Summary sent to facility. RN given number for report. DC packet on chart. Ambulance transport requested for patient.   TOC signing off.   Final next level of care: Long Term Acute Care (LTAC) Barriers to Discharge: Barriers Resolved   Patient Goals and CMS Choice            Discharge Placement                Patient to be transferred to facility by: [facility] Name of family member notified: Norleen Hummer Patient and family notified of of transfer: 02/16/24  Discharge Plan and Services Additional resources added to the After Visit Summary for     Discharge Planning Services: CM Consult            DME Arranged: N/A         HH Arranged: NA          Social Drivers of Health (SDOH) Interventions SDOH Screenings   Food Insecurity: No Food Insecurity (02/08/2024)  Housing: High Risk (02/08/2024)  Transportation Needs: No Transportation Needs (02/08/2024)  Utilities: Not At Risk (02/08/2024)  Financial Resource Strain: Low Risk (07/06/2023)   Received from [facility]  Social Connections: Unknown (02/08/2024)  Tobacco Use: Medium Risk (02/08/2024)     Readmission Risk Interventions    02/09/2024    1:30 PM  Readmission Risk Prevention Plan  Transportation Screening Complete  Medication Review (RN Care Manager) Complete  PCP or Specialist appointment within 3-5 days of discharge Complete  HRI or Home Care Consult Complete  SW Recovery Care/Counseling Consult Complete  Palliative  Care Screening Not Applicable  Skilled Nursing Facility Complete

## 2024-02-16 NOTE — Progress Notes (Signed)
 " [facility]  ROUNDING NOTE   Subjective:   68 year old female with past medical history of ESRD on HD MTWF at [facility], hypertension, hyperlipidemia, diabetes mellitus type 2, anemia chronic kidney disease, secondary hyperparathyroidism who came in with URI symptoms and tested positive for the flu.  Update: Patient seen and evaluated during dialysis   HEMODIALYSIS FLOWSHEET:  Blood Flow Rate (mL/min): 309 mL/min Arterial Pressure (mmHg): -146.86 mmHg Venous Pressure (mmHg): 122.62 mmHg TMP (mmHg): 16.97 mmHg Ultrafiltration Rate (mL/min): 552 mL/min Dialysate Flow Rate (mL/min): 299 ml/min  Resting comfortably Occasional cough  Objective:  Vital signs in last 24 hours:  Temp:  [97.5 F (36.4 C)-97.8 F (36.6 C)] 97.6 F (36.4 C) (12/26 0740) Pulse Rate:  [64-75] 75 (12/26 1000) Resp:  [13-24] 15 (12/26 1000) BP: (102-194)/(57-83) 188/78 (12/26 1000) SpO2:  [87 %-100 %] 100 % (12/26 1000) Weight:  [90.1 kg-91.9 kg] 90.1 kg (12/26 0737)  Weight change: -2.1 kg Filed Weights   02/15/24 0500 02/16/24 0431 02/16/24 0737  Weight: 94 kg 91.9 kg 90.1 kg    Intake/Output: I/O last 3 completed shifts: In: -  Out: 450 [Urine:450]   Intake/Output this shift:  No intake/output data recorded.  Physical Exam: General: No acute distress, restless  Head: Normocephalic, atraumatic. Moist oral mucosal membranes  Lungs:  Coarse breath sounds, 2 L [place] O2  Heart: regular  Abdomen:  Soft, nontender, bowel sounds present, obese  Extremities: no peripheral edema.  Neurologic: Awake, alert, following commands  Skin: No acute rash  Access: Right IJ PermCath    Basic Metabolic Panel: Recent Labs  Lab 02/11/24 0522 02/12/24 1014 02/13/24 0759 02/14/24 0606 02/16/24 0805  NA 128* 133* 133* 133* 132*  K 5.4* 4.1 4.1 4.3 5.4*  CL 89* 92* 93* 96* 95*  CO2 19* 25 24 23 22   GLUCOSE 277* 148* 125* 164* 120*  BUN 73* 54* 66* 43* 58*  CREATININE 4.07* 3.69* 4.20*  3.34* 4.48*  CALCIUM  7.3* 7.9* 7.4* 7.0* 6.6*  PHOS  --   --  5.1*  --  5.1*    Liver Function Tests: Recent Labs  Lab 02/12/24 1014 02/13/24 0759 02/16/24 0805  AST 21  --   --   ALT 16  --   --   ALKPHOS 169*  --   --   BILITOT 0.4  --   --   PROT 6.4*  --   --   ALBUMIN  3.6 3.8 3.5   No results for input(s): LIPASE, AMYLASE in the last 168 hours. No results for input(s): AMMONIA in the last 168 hours.  CBC: Recent Labs  Lab 02/09/24 1200 02/11/24 0522 02/12/24 1014 02/13/24 0759 02/16/24 0805  WBC 5.2 5.5 8.1 7.4 6.6  NEUTROABS  --   --  6.4  --   --   HGB 11.0* 12.8 12.3 11.3* 10.0*  HCT 33.7* 38.6 38.4 35.1* 32.0*  MCV 88.7 88.7 88.5 89.3 92.2  PLT 155 130* 154 155 102*    Cardiac Enzymes: No results for input(s): CKTOTAL, CKMB, CKMBINDEX, TROPONINI in the last 168 hours.  BNP: Invalid input(s): POCBNP  CBG: Recent Labs  Lab 02/15/24 1238 02/15/24 1630 02/15/24 2025 02/15/24 2354 02/16/24 0424  GLUCAP 106* 134* 181* 135* 154*    Microbiology: Results for orders placed or performed during the hospital encounter of 02/08/24  Resp panel by RT-PCR (RSV, Flu A&B, Covid) Anterior Nasal Swab     Status: Abnormal   Collection Time: 02/08/24  8:45 AM  Specimen: Anterior Nasal Swab  Result Value Ref Range Status   SARS Coronavirus 2 by RT PCR NEGATIVE NEGATIVE Final    Comment: (NOTE) SARS-CoV-2 target nucleic acids are NOT DETECTED.  The SARS-CoV-2 RNA is generally detectable in upper respiratory specimens during the acute phase of infection. The lowest concentration of SARS-CoV-2 viral copies this assay can detect is 138 copies/mL. A negative result does not preclude SARS-Cov-2 infection and should not be used as the sole basis for treatment or other patient management decisions. A negative result may occur with  improper specimen collection/handling, submission of specimen other than nasopharyngeal swab, presence of viral  mutation(s) within the areas targeted by this assay, and inadequate number of viral copies(<138 copies/mL). A negative result must be combined with clinical observations, patient history, and epidemiological information. The expected result is Negative.  Fact Sheet for Patients:  bloggercourse.com  Fact Sheet for Healthcare Providers:  seriousbroker.it  This test is no t yet approved or cleared by the [place]  FDA and  has been authorized for detection and/or diagnosis of SARS-CoV-2 by FDA under an Emergency Use Authorization (EUA). This EUA will remain  in effect (meaning this test can be used) for the duration of the COVID-19 declaration under Section 564(b)(1) of the Act, 21 U.S.C.section 360bbb-3(b)(1), unless the authorization is terminated  or revoked sooner.       Influenza A by PCR POSITIVE (A) NEGATIVE Final   Influenza B by PCR NEGATIVE NEGATIVE Final    Comment: (NOTE) The Xpert Xpress SARS-CoV-2/FLU/RSV plus assay is intended as an aid in the diagnosis of influenza from Nasopharyngeal swab specimens and should not be used as a sole basis for treatment. Nasal washings and aspirates are unacceptable for Xpert Xpress SARS-CoV-2/FLU/RSV testing.  Fact Sheet for Patients: bloggercourse.com  Fact Sheet for Healthcare Providers: seriousbroker.it  This test is not yet approved or cleared by the [place]  FDA and has been authorized for detection and/or diagnosis of SARS-CoV-2 by FDA under an Emergency Use Authorization (EUA). This EUA will remain in effect (meaning this test can be used) for the duration of the COVID-19 declaration under Section 564(b)(1) of the Act, 21 U.S.C. section 360bbb-3(b)(1), unless the authorization is terminated or revoked.     Resp Syncytial Virus by PCR NEGATIVE NEGATIVE Final    Comment: (NOTE) Fact Sheet for  Patients: bloggercourse.com  Fact Sheet for Healthcare Providers: seriousbroker.it  This test is not yet approved or cleared by the [place]  FDA and has been authorized for detection and/or diagnosis of SARS-CoV-2 by FDA under an Emergency Use Authorization (EUA). This EUA will remain in effect (meaning this test can be used) for the duration of the COVID-19 declaration under Section 564(b)(1) of the Act, 21 U.S.C. section 360bbb-3(b)(1), unless the authorization is terminated or revoked.  Performed at [facility], [address] [place], [place] [address]   Blood Culture (routine x 2)     Status: None   Collection Time: 02/08/24  8:45 AM   Specimen: BLOOD  Result Value Ref Range Status   Specimen Description BLOOD BLOOD LEFT HAND  Final   Special Requests   Final    BOTTLES DRAWN AEROBIC AND ANAEROBIC Blood Culture adequate volume   Culture   Final    NO GROWTH 5 DAYS Performed at [facility], [address], [place], [place] [address]    Report Status 02/13/2024 FINAL  Final  Blood Culture (routine x 2)     Status: None  Collection Time: 02/08/24  8:50 AM   Specimen: BLOOD  Result Value Ref Range Status   Specimen Description BLOOD BLOOD LEFT ARM  Final   Special Requests   Final    BOTTLES DRAWN AEROBIC AND ANAEROBIC Blood Culture results may not be optimal due to an inadequate volume of blood received in culture bottles   Culture   Final    NO GROWTH 5 DAYS Performed at [facility], [address] [place], [place] [address]    Report Status 02/13/2024 FINAL  Final  MRSA Next Gen by PCR, Nasal     Status: Abnormal   Collection Time: 02/08/24  5:02 PM   Specimen: Nasal Mucosa; Nasal Swab  Result Value Ref Range Status   MRSA by PCR Next Gen DETECTED (A) NOT DETECTED Final    Comment: RESULT CALLED TO, READ BACK BY AND VERIFIED WITH: Fleming Kitty @2057  on 02/08/24  skl (NOTE) The GeneXpert MRSA Assay (FDA approved for NASAL specimens only), is one component of a comprehensive MRSA colonization surveillance program. It is not intended to diagnose MRSA infection nor to guide or monitor treatment for MRSA infections. Test performance is not FDA approved in patients less than 18 years old. Performed at [facility], [address] [place], [place] [address]     Coagulation Studies: No results for input(s): LABPROT, INR in the last 72 hours.   Urinalysis: No results for input(s): COLORURINE, LABSPEC, PHURINE, GLUCOSEU, HGBUR, BILIRUBINUR, KETONESUR, PROTEINUR, UROBILINOGEN, NITRITE, LEUKOCYTESUR in the last 72 hours.  Invalid input(s): APPERANCEUR    Imaging: No results found.    Medications:      benzonatate   100 mg Oral TID   carvedilol   6.25 mg Oral BID WC   Chlorhexidine  Gluconate Cloth  6 each Topical Q0600   guaiFENesin   1,200 mg Oral BID   heparin  injection (subcutaneous)  5,000 Units Subcutaneous Q8H   hydrALAZINE   5 mg Intravenous Once   linaclotide   145 mcg Oral QPM   losartan   50 mg Oral Daily   pantoprazole   40 mg Oral Daily   sodium chloride  flush  3 mL Intravenous Q12H   acetaminophen  **OR** acetaminophen , dextrose , hydrOXYzine , ipratropium-albuterol , labetalol , ondansetron  **OR** ondansetron  (ZOFRAN ) IV, senna-docusate  Assessment/ Plan:  68 y.o. female ESRD on HD M TTS at [facility], hypertension, upper lipidemia, diabetes mellitus type 2, anemia chronic kidney disease, secondary hyperparathyroidism, GERD who presented with upper respiratory infections and was found to have influenza virus.  1.  ESRD on HD MTWF:   Receiving treatment today, UF 0.5L, increased to 1L during treatment. Next treatment scheduled for Monday.   2.  Hypertension with chronic kidney disease.  Maintain the patient on carvedilol  and losartan  for hypertension control. Blood pressure stable during  dialysis.   3.  Secondary hyperparathyroidism.  Patient receives cholecalciferol  outpatient. Hypocalcemia decreased likely due to septic reaction. - restarted cholecalciferol .   - Will continue to monitor.   4.  Anemia of chronic disease.  Hemoglobin 10.0. ESA as outpatient.      LOS: 7 [place] 12/26/202510:29 AM   "
